# Patient Record
Sex: Male | Born: 1950 | Race: White | Hispanic: No | Marital: Married | Smoking: Former smoker
Health system: Southern US, Community
[De-identification: ages and names within clinical notes are randomized; demographics above are authoritative.]

## PROBLEM LIST (undated history)

## (undated) DIAGNOSIS — R351 Nocturia: Secondary | ICD-10-CM

## (undated) DIAGNOSIS — Z87442 Personal history of urinary calculi: Secondary | ICD-10-CM

## (undated) DIAGNOSIS — F32A Depression, unspecified: Secondary | ICD-10-CM

## (undated) DIAGNOSIS — H9190 Unspecified hearing loss, unspecified ear: Secondary | ICD-10-CM

## (undated) DIAGNOSIS — Z974 Presence of external hearing-aid: Secondary | ICD-10-CM

## (undated) DIAGNOSIS — E785 Hyperlipidemia, unspecified: Secondary | ICD-10-CM

## (undated) DIAGNOSIS — I10 Essential (primary) hypertension: Secondary | ICD-10-CM

## (undated) DIAGNOSIS — I2699 Other pulmonary embolism without acute cor pulmonale: Secondary | ICD-10-CM

## (undated) DIAGNOSIS — Z972 Presence of dental prosthetic device (complete) (partial): Secondary | ICD-10-CM

## (undated) DIAGNOSIS — Z7901 Long term (current) use of anticoagulants: Secondary | ICD-10-CM

## (undated) DIAGNOSIS — C61 Malignant neoplasm of prostate: Secondary | ICD-10-CM

## (undated) DIAGNOSIS — Z973 Presence of spectacles and contact lenses: Secondary | ICD-10-CM

## (undated) DIAGNOSIS — R413 Other amnesia: Secondary | ICD-10-CM

## (undated) DIAGNOSIS — Z87820 Personal history of traumatic brain injury: Secondary | ICD-10-CM

## (undated) DIAGNOSIS — F329 Major depressive disorder, single episode, unspecified: Secondary | ICD-10-CM

## (undated) DIAGNOSIS — R269 Unspecified abnormalities of gait and mobility: Secondary | ICD-10-CM

## (undated) HISTORY — PX: PROSTATE SURGERY: SHX751

## (undated) HISTORY — DX: Unspecified abnormalities of gait and mobility: R26.9

## (undated) HISTORY — PX: IMPLANTATION BONE ANCHORED HEARING AID: SUR691

## (undated) HISTORY — PX: COLONOSCOPY W/ POLYPECTOMY: SHX1380

## (undated) HISTORY — DX: Hyperlipidemia, unspecified: E78.5

## (undated) HISTORY — PX: FINGER SURGERY: SHX640

## (undated) HISTORY — PX: ANTERIOR CERVICAL DECOMP/DISCECTOMY FUSION: SHX1161

## (undated) HISTORY — DX: Essential (primary) hypertension: I10

---

## 2003-12-12 ENCOUNTER — Ambulatory Visit (HOSPITAL_COMMUNITY): Admission: RE | Admit: 2003-12-12 | Discharge: 2003-12-12 | Payer: Self-pay | Admitting: Gastroenterology

## 2008-10-17 DIAGNOSIS — Z87820 Personal history of traumatic brain injury: Secondary | ICD-10-CM

## 2008-10-17 HISTORY — DX: Personal history of traumatic brain injury: Z87.820

## 2008-10-19 ENCOUNTER — Inpatient Hospital Stay (HOSPITAL_COMMUNITY): Admission: EM | Admit: 2008-10-19 | Discharge: 2008-10-20 | Payer: Self-pay | Admitting: Emergency Medicine

## 2008-11-23 ENCOUNTER — Ambulatory Visit (HOSPITAL_COMMUNITY): Admission: RE | Admit: 2008-11-23 | Discharge: 2008-11-23 | Payer: Self-pay | Admitting: Plastic Surgery

## 2009-01-10 ENCOUNTER — Encounter
Admission: RE | Admit: 2009-01-10 | Discharge: 2009-04-10 | Payer: Self-pay | Admitting: Physical Medicine & Rehabilitation

## 2009-01-12 ENCOUNTER — Ambulatory Visit (HOSPITAL_COMMUNITY)
Admission: RE | Admit: 2009-01-12 | Discharge: 2009-01-12 | Payer: Self-pay | Admitting: Physical Medicine & Rehabilitation

## 2009-01-12 ENCOUNTER — Ambulatory Visit: Payer: Self-pay | Admitting: Physical Medicine & Rehabilitation

## 2009-02-08 ENCOUNTER — Ambulatory Visit: Payer: Self-pay | Admitting: Physical Medicine & Rehabilitation

## 2009-04-05 ENCOUNTER — Ambulatory Visit: Payer: Self-pay | Admitting: Physical Medicine & Rehabilitation

## 2009-07-04 ENCOUNTER — Encounter
Admission: RE | Admit: 2009-07-04 | Discharge: 2009-10-02 | Payer: Self-pay | Admitting: Physical Medicine & Rehabilitation

## 2009-07-06 ENCOUNTER — Ambulatory Visit: Payer: Self-pay | Admitting: Physical Medicine & Rehabilitation

## 2009-07-30 ENCOUNTER — Ambulatory Visit: Payer: Self-pay | Admitting: Physical Medicine & Rehabilitation

## 2009-09-03 ENCOUNTER — Ambulatory Visit: Payer: Self-pay | Admitting: Physical Medicine & Rehabilitation

## 2009-10-08 ENCOUNTER — Encounter
Admission: RE | Admit: 2009-10-08 | Discharge: 2009-11-12 | Payer: Self-pay | Admitting: Physical Medicine & Rehabilitation

## 2009-11-12 ENCOUNTER — Ambulatory Visit: Payer: Self-pay | Admitting: Physical Medicine & Rehabilitation

## 2010-01-16 ENCOUNTER — Encounter
Admission: RE | Admit: 2010-01-16 | Discharge: 2010-04-16 | Payer: Self-pay | Admitting: Physical Medicine & Rehabilitation

## 2010-01-22 ENCOUNTER — Ambulatory Visit: Payer: Self-pay | Admitting: Physical Medicine & Rehabilitation

## 2010-03-19 ENCOUNTER — Ambulatory Visit: Payer: Self-pay | Admitting: Physical Medicine & Rehabilitation

## 2010-06-21 ENCOUNTER — Ambulatory Visit: Payer: BC Managed Care – PPO | Admitting: Physical Medicine & Rehabilitation

## 2010-06-21 ENCOUNTER — Ambulatory Visit: Payer: Medicare Other | Attending: Physical Medicine & Rehabilitation

## 2010-06-21 ENCOUNTER — Ambulatory Visit: Admit: 2010-06-21 | Payer: Self-pay | Admitting: Physical Medicine & Rehabilitation

## 2010-06-21 DIAGNOSIS — Z79899 Other long term (current) drug therapy: Secondary | ICD-10-CM | POA: Insufficient documentation

## 2010-06-21 DIAGNOSIS — M542 Cervicalgia: Secondary | ICD-10-CM

## 2010-06-21 DIAGNOSIS — Z87820 Personal history of traumatic brain injury: Secondary | ICD-10-CM | POA: Insufficient documentation

## 2010-06-21 DIAGNOSIS — M47812 Spondylosis without myelopathy or radiculopathy, cervical region: Secondary | ICD-10-CM

## 2010-06-21 DIAGNOSIS — F329 Major depressive disorder, single episode, unspecified: Secondary | ICD-10-CM | POA: Insufficient documentation

## 2010-06-21 DIAGNOSIS — F411 Generalized anxiety disorder: Secondary | ICD-10-CM | POA: Insufficient documentation

## 2010-06-21 DIAGNOSIS — M4802 Spinal stenosis, cervical region: Secondary | ICD-10-CM | POA: Insufficient documentation

## 2010-06-21 DIAGNOSIS — G8929 Other chronic pain: Secondary | ICD-10-CM | POA: Insufficient documentation

## 2010-06-21 DIAGNOSIS — F3289 Other specified depressive episodes: Secondary | ICD-10-CM | POA: Insufficient documentation

## 2010-06-21 DIAGNOSIS — M47817 Spondylosis without myelopathy or radiculopathy, lumbosacral region: Secondary | ICD-10-CM

## 2010-08-13 ENCOUNTER — Other Ambulatory Visit: Payer: Self-pay | Admitting: Family Medicine

## 2010-08-13 DIAGNOSIS — M542 Cervicalgia: Secondary | ICD-10-CM

## 2010-08-14 ENCOUNTER — Other Ambulatory Visit: Payer: BC Managed Care – PPO

## 2010-08-14 ENCOUNTER — Ambulatory Visit
Admission: RE | Admit: 2010-08-14 | Discharge: 2010-08-14 | Disposition: A | Discharge status: Home / Self Care | Payer: Medicare Other | Source: Ambulatory Visit | Attending: Family Medicine | Admitting: Family Medicine

## 2010-08-14 DIAGNOSIS — M542 Cervicalgia: Secondary | ICD-10-CM

## 2010-08-26 LAB — CBC
HCT: 39.5 % (ref 39.0–52.0)
HCT: 43.1 % (ref 39.0–52.0)
Hemoglobin: 14.5 g/dL (ref 13.0–17.0)
MCHC: 33.7 g/dL (ref 30.0–36.0)
MCV: 87.8 fL (ref 78.0–100.0)
MCV: 88 fL (ref 78.0–100.0)
Platelets: 160 10*3/uL (ref 150–400)
RDW: 13.6 % (ref 11.5–15.5)
WBC: 7.6 10*3/uL (ref 4.0–10.5)

## 2010-08-26 LAB — BASIC METABOLIC PANEL
BUN: 12 mg/dL (ref 6–23)
Chloride: 109 mEq/L (ref 96–112)
Glucose, Bld: 82 mg/dL (ref 70–99)
Potassium: 3.8 mEq/L (ref 3.5–5.1)

## 2010-08-26 LAB — APTT: aPTT: 28 seconds (ref 24–37)

## 2010-09-03 ENCOUNTER — Other Ambulatory Visit: Payer: Self-pay | Admitting: Neurological Surgery

## 2010-09-03 DIAGNOSIS — M542 Cervicalgia: Secondary | ICD-10-CM

## 2010-09-04 ENCOUNTER — Other Ambulatory Visit: Payer: Self-pay | Admitting: Neurological Surgery

## 2010-09-04 DIAGNOSIS — M542 Cervicalgia: Secondary | ICD-10-CM

## 2010-09-06 ENCOUNTER — Other Ambulatory Visit: Payer: Medicare Other

## 2010-09-06 ENCOUNTER — Ambulatory Visit
Admission: RE | Admit: 2010-09-06 | Discharge: 2010-09-06 | Disposition: A | Discharge status: Home / Self Care | Payer: Medicare Other | Source: Ambulatory Visit | Attending: Neurological Surgery | Admitting: Neurological Surgery

## 2010-09-06 DIAGNOSIS — M542 Cervicalgia: Secondary | ICD-10-CM

## 2010-09-13 ENCOUNTER — Encounter: Payer: Medicare Other | Attending: Physical Medicine & Rehabilitation

## 2010-09-13 ENCOUNTER — Ambulatory Visit: Payer: Medicare Other | Admitting: Physical Medicine & Rehabilitation

## 2010-09-13 DIAGNOSIS — M5020 Other cervical disc displacement, unspecified cervical region: Secondary | ICD-10-CM | POA: Insufficient documentation

## 2010-09-13 DIAGNOSIS — M5412 Radiculopathy, cervical region: Secondary | ICD-10-CM

## 2010-09-13 DIAGNOSIS — M79609 Pain in unspecified limb: Secondary | ICD-10-CM | POA: Insufficient documentation

## 2010-09-13 DIAGNOSIS — Z79899 Other long term (current) drug therapy: Secondary | ICD-10-CM | POA: Insufficient documentation

## 2010-09-13 DIAGNOSIS — M4802 Spinal stenosis, cervical region: Secondary | ICD-10-CM

## 2010-09-13 DIAGNOSIS — Z87820 Personal history of traumatic brain injury: Secondary | ICD-10-CM | POA: Insufficient documentation

## 2010-09-13 DIAGNOSIS — M542 Cervicalgia: Secondary | ICD-10-CM | POA: Insufficient documentation

## 2010-09-13 NOTE — Assessment & Plan Note (Signed)
Fred [name] returns today, last saw me on June 21, 2010, in the interval time period, developed some pain running down his arms as well as increased neck pain.  He called his primary care physician and was referred to Neurosurgery.  He initially had a CT of his cervical spine on August 14, 2010, demonstrating a central right-sided protrusion flattening of the cord at C3-4, at C4-5, large central protrusion causing some cord compression, at C4-5 central disk causing mild stenosis and left greater than right foraminal stenosis C5-6, and at C6- 7 central right-sided disk protrusion.  MRI of the cervical spine was obtained on September 07, 2010.  I looked at these films, large extrusion C4- 5, caudal migration, epidural hemorrhage contributing spondylosis C5-6, mild central stenosis without cord deformity, severe right and moderate left foraminal stenosis C6-7.  The patient has had some grip strength weakness on the right side.  He has Neurosurgery followup in 3 days to look at type of surgery as well as timing.  He has had no gait disturbance.  No bowel or bladder dysfunction.  He has numbness and tingling in the hands which does hurt him.  His allergies include NEURONTIN, unfortunately.  His current meds include hydrocodone 10/325 t.i.d., he takes Tylenol with this 1 tablet.  REVIEW OF SYSTEMS:  Numbness, tingling, dizziness, confusion, depression, anxiety.  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  He also has a history of back pain, mild disk space narrowing L5- S1.  He does have a full-sized S1-S2 disk space.  PHYSICAL EXAMINATION:  VITAL SIGNS:  His blood pressure is elevated today 170/110.  I will ask nursing to recheck, but still elevated, he will need to contact Dr. Nicholos [name].  The patient is aware of this.  Pulse 65, respirations 18, O2 sat 98% on room air. MUSCULOSKELETAL:  His neck has limited range of motion, 50% range forward flexion,  extension, lateral rotation, and bending.  Negative Spurling's.  His most painful is with extension.  His upper extremity strength is 4/5 in the right deltoid, biceps, triceps grip, 5- in the left upper extremity.  Bilateral lower extremities are 5/5 strength. Deep tendon reflexes and mild hyperreflexia at bilateral knees, 2+ bilateral ankles, 2+ bilateral upper extremities.  Negative Hoffman's.  IMPRESSION:  Cervical stenosis with progression, likely couple of areas including C4-5 as well as C6-7, right paracentral.  PLAN:  Neurosurgical reevaluation Monday.  In the interval time, we will add some cyclobenzaprine 5 mg t.i.d.  We will increase his hydrocodone to q.i.d., he just got these filled last week, so he should have some left.  We will also add some nortriptyline 10 mg bedtime.  Consider steroid burst and taper if no better.     Fred [name], M.D. Electronically Signed    AEK/MedQ D:  09/13/2010 10:16:04  T:  09/13/2010 21:42:28  Job #:  [id number]  cc:   Fred [name], M.D. Fax: [phone]  Fred Alert, MD Fax: [phone]

## 2010-09-18 ENCOUNTER — Encounter (HOSPITAL_COMMUNITY)
Admission: RE | Admit: 2010-09-18 | Discharge: 2010-09-18 | Disposition: A | Discharge status: Home / Self Care | Payer: Medicare Other | Source: Ambulatory Visit | Attending: Neurological Surgery | Admitting: Neurological Surgery

## 2010-09-18 ENCOUNTER — Ambulatory Visit (HOSPITAL_COMMUNITY)
Admission: RE | Admit: 2010-09-18 | Discharge: 2010-09-18 | Disposition: A | Discharge status: Home / Self Care | Payer: Medicare Other | Source: Ambulatory Visit | Attending: Neurological Surgery | Admitting: Neurological Surgery

## 2010-09-18 ENCOUNTER — Other Ambulatory Visit (HOSPITAL_COMMUNITY): Payer: Self-pay | Admitting: Neurological Surgery

## 2010-09-18 DIAGNOSIS — M4802 Spinal stenosis, cervical region: Secondary | ICD-10-CM

## 2010-09-18 LAB — BASIC METABOLIC PANEL
BUN: 21 mg/dL (ref 6–23)
CO2: 28 mEq/L (ref 19–32)
Chloride: 102 mEq/L (ref 96–112)
Creatinine, Ser: 1.37 mg/dL (ref 0.4–1.5)
Glucose, Bld: 99 mg/dL (ref 70–99)

## 2010-09-18 LAB — CBC
MCH: 28.5 pg (ref 26.0–34.0)
MCHC: 33.8 g/dL (ref 30.0–36.0)
MCV: 84.2 fL (ref 78.0–100.0)
Platelets: 154 10*3/uL (ref 150–400)
RDW: 13.6 % (ref 11.5–15.5)
WBC: 6.3 10*3/uL (ref 4.0–10.5)

## 2010-09-18 LAB — APTT: aPTT: 26 seconds (ref 24–37)

## 2010-09-18 LAB — DIFFERENTIAL
Eosinophils Absolute: 0.2 10*3/uL (ref 0.0–0.7)
Eosinophils Relative: 3 % (ref 0–5)
Lymphs Abs: 2.2 10*3/uL (ref 0.7–4.0)

## 2010-09-19 ENCOUNTER — Inpatient Hospital Stay (HOSPITAL_COMMUNITY): Payer: Medicare Other

## 2010-09-19 ENCOUNTER — Ambulatory Visit (HOSPITAL_COMMUNITY)
Admission: RE | Admit: 2010-09-19 | Discharge: 2010-09-20 | Disposition: A | Discharge status: Home / Self Care | Payer: Medicare Other | Source: Ambulatory Visit | Attending: Neurological Surgery | Admitting: Neurological Surgery

## 2010-09-19 DIAGNOSIS — Z01812 Encounter for preprocedural laboratory examination: Secondary | ICD-10-CM | POA: Insufficient documentation

## 2010-09-19 DIAGNOSIS — M4712 Other spondylosis with myelopathy, cervical region: Secondary | ICD-10-CM | POA: Insufficient documentation

## 2010-09-19 DIAGNOSIS — Z79899 Other long term (current) drug therapy: Secondary | ICD-10-CM | POA: Insufficient documentation

## 2010-09-19 DIAGNOSIS — I10 Essential (primary) hypertension: Secondary | ICD-10-CM | POA: Insufficient documentation

## 2010-09-19 DIAGNOSIS — M5000 Cervical disc disorder with myelopathy, unspecified cervical region: Secondary | ICD-10-CM | POA: Insufficient documentation

## 2010-09-19 DIAGNOSIS — M488X2 Other specified spondylopathies, cervical region: Secondary | ICD-10-CM | POA: Insufficient documentation

## 2010-09-19 DIAGNOSIS — Z01818 Encounter for other preprocedural examination: Secondary | ICD-10-CM | POA: Insufficient documentation

## 2010-09-23 NOTE — Op Note (Signed)
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  PROCEDURES: 1. Decompressive anterior cervical diskectomy at C4-5, C5-6, and C6-7     for central canal and nerve root decompression 2. Anterior cervical arthrodesis at C4-5, C5-6, and C6-7 utilizing 9-     mm PEEK interbody cages, packed with local autograft and Actifuse     putty. 3. Anterior cervical plating at C4-C7 utilizing the NuVasive helix     translational plate.  SURGEON:  [name], MD  ASSISTANT:  Donalee Citrin, MD  ANESTHESIA:  General endotracheal.  COMPLICATIONS:  None apparent.  INDICATIONS FOR PROCEDURE:  Mr. Hightower is a 60 year old gentleman who presented with a several-month history of progressive numbness and weakness of his hands with difficulty with gait.  He was found to be myelopathic on exam.  He had an MRI which showed a very large disk herniation at C4-5 with severe cord compression and extension of the disk down behind the vertebral body of C5.  He also had spondylosis with some stenosis at C5-6 and C6-7, bilateral foraminal stenosis, and had a long history of chronic neck pain with right arm pain prior to his development of myelopathy.  He had a CT scan which showed some mild of ossification of the posterior  longitudinal ligament.  I recommended a 3- level ACDF with plating at C4-5, C5-6, and C6-7 in hopes of improving his syndrome.  We considered a corpectomy of C5, but felt with wide diskectomies of C4-5 and C5-6 we will be able to get the disk from out the behind the body of C5 without doing a full corpectomy.  He understood the risks, benefits, and expected outcomes of this procedure and wished to proceed.  DESCRIPTION OF PROCEDURE:  The patient was taken to the operating room. After induction of adequate generalized endotracheal anesthesia, he was placed in supine position on the operating room table.  His right anterior cervical region was prepped with DuraPrep and draped in the usual sterile fashion.  5 mL of local anesthesia was injected and a transverse incision was made to the right of midline and carried down to the platysma which was elevated, opened, and undermined with Metzenbaum scissors.  I then dissected a plane medial to the sternocleidomastoid muscle and internal carotid artery and lateral to the trachea and esophagus to expose C4-5, C5-6, and C6-7.  Intraoperative fluoroscopy confirmed my level and then the longus colli muscles were taken down and the shadow line retractors  were placed.  The anterior osteophytes were removed from C4-5, C5-6, and C6-7 to flatten the anterior cervical spine.  The annulus was incised and the initial diskectomy was done with pituitary rongeurs and curved curettes.  I then used the high-speed drill at each level to drill the endplates.  I widened the disk spaces of C4-5 and C5-6 to a height of 9 mm.  I removed a significant portion of C5 vertebral body from below and from above the C4-5 also knowing that I had to get the behind the body of C5.  I drilled down to the level of the posterior longitudinal ligament at each level.  The drill shavings were saved in the mucous trap for later arthrodesis.  I started at C4-5.  There was a large central  disk herniation with a large hole in the posterior longitudinal ligament.  I was able to remove this with a nerve hook and a pituitary rongeur.  This quickly relaxed the dura.  I was then able to open the posterior longitudinal ligament further and removed it while undercutting the bodies of C4 and C5.  He did have a partial ossification of the posterior longitudinal ligament and we could remove all of this without risk of CSF leak, but when we were done we had released it and the dura was full and capacious all the way across and we could see the cord pulsatile through the dura.  We felt like we had an adequate decompression of the central canal because of this and we could palpate with a nerve hook circumferentially.  I did remove several large fragments from underneath the body of C5 and the dura relaxed up against the posterior margin of C5.  Therefore, I felt like I had a good decompression there.  I then went to C5-6.  We found the same thing.  We found partial ossification, but we were able to remove most of this and undercut the bodies of C5 and C6 until the dura was once again relaxed and we could see the cord pulsatile through the dura.  We did bilateral foraminotomies to decompress the C6 nerve roots and again we decompressed at this level to a height of 9 mm.  At C6-7, we did the exact same thing.  We opened the posterior longitudinal ligament.  We removed it undercutting bodies of C6 and C7 and performed bilateral foraminotomies to decompress the C7 nerve roots.  I then palpated it with a nerve hook to ensure adequate decompression.  At this level, I decompressed a height of 8 mm.  Corresponding PEEK interbody cages were packed with local autograft and Actifuse putty and placed into the disk spaces at C4-5, C5-6, and C6-7.  We then used a NuVasive helix translational plate and placed two 13-mm fixed angle screws in the bodies of C4, C5, C6, and C7 and these locked into plate  by locking ring within the plate.  We then removed the stage from the translational part of the plate.  We then irrigated it with saline solution containing bacitracin and dried all bleeding points with bipolar cautery and with Surgifoam.  I placed a #7 flat JP drain and once meticulous hemostasis was achieved closed the platysma with 3-0 Vicryl, closed the subcuticular tissue with 3-0 Vicryl, and closed the skin with benzoin and Steri-Strips.  The drapes were removed.  Sterile dressing was applied.  The patient awakened from general anesthesia and transferred to the recovery room in stable condition.  At the end  of the procedure, all sponge, needle, and instrument counts were correct.     [name], MD     DSJ/MEDQ  D:  09/19/2010  T:  09/20/2010  Job:  [id number]  Electronically Signed by Marikay Alar MD on 09/23/2010 08:33:50 AM

## 2010-10-01 NOTE — Consult Note (Signed)
[name], Fred [name] NO.:  [id number]   MEDICAL RECORD NO.:  [id number]          PATIENT TYPE:  INP   LOCATION:  1847                         FACILITY:  [facility]   PHYSICIAN:  Loreta Ave, MD DATE OF BIRTH:  1950/10/24   DATE OF CONSULTATION:  10/19/2008  DATE OF DISCHARGE:                                 CONSULTATION   REFERRING PHYSICIAN:  Marta Lamas. Lindie Spruce, MD   CHIEF COMPLAINT:  Left wrist pain.   HISTORY OF PRESENT ILLNESS:  Fred [name] is a 60 year old right-hand-  dominant male who sustained a fall from approximately 6 feet yesterday  from a ladder and had fracture.  On his way down, he put out his left  wrist and struck the volar surface from the ground.  He complained of  left wrist pain but did not notice deformity.  He did note bruising  about the volar surface of his left wrist.  He has limited his hand use  to some extent.   Of note, he has also developed nausea and headache over the last day.  He was seen at [facility] on October 19, 2008 for evaluation and was  determined to have hemorrhagic contusions of the frontal lobes.  He will  be monitored by the Trauma Team for this.  I was consulted for his left  wrist.   ALLERGIES:  He has no known drug allergies.   Medications:    Wellbutrin 300 mg po qd    Benicar 40 mg po qd    Zocor 80 mg po qd    Effexor 40 mg po qd   PAST MEDICAL HISTORY:  Significant for history of rectal bleeding,  history of right hand trauma with right long finger amputation and right  index finger fracture, hypercholesterolemia.   PSH:  Right long finger amputation, right index finger fracture  fixation.   ROS: 12 system review negative except per HPI.   X-ray examination of the left wrist reveals a cortical defect on the  volar distal radius.  The fracture appears to only involve the volar  cortex with the metaphyseal flare.  It is not intra-articular.   PHYSICAL EXAMINATION:  Examination of the left upper  extremity reveals  normal active and passive range of motion of the shoulder, elbow, wrist,  and fingers.  Wrist motion is somewhat limited secondary to pain.  Sensation is intact in the radial, ulnar, and median nerve distributions  to light touch.  He has 2+ radial pulse and brisk capillary refill.  Flexion and extension is intact in all fingers.  He is focally tender  over the radial and volar aspect of the distal radius.   ASSESSMENT AND PLAN:  Fred [name] is a 60 year old right-hand  dominant male with a left distal radius fracture.  After discussion of  his fracture with the patient, I recommended splint immobilization.  The  patient was placed in a volar forearm splint.  He tolerated this well.  No manipulation was performed.  He is to followup in 2 weeks at [phone]  with Dr. Noelle Penner.      Loreta Ave,  MD  Electronically Signed     CF/MEDQ  D:  10/19/2008  T:  10/20/2008  Job:  [id number]

## 2010-10-01 NOTE — Group Therapy Note (Signed)
REASON FOR REFERRAL:  The patient referred for the evaluation of neck  pain and back pain.   HISTORY OF PRESENT ILLNESS:  A 60 year old male who has a long history  of neck and back pain.  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  He has had no neck or back surgery.  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  More recently, he had a  overnight admission for a mild traumatic brain injury, he fell off a 6  feet ladder and had a occipital skull fracture.  He has some  exacerbation of his pain related to this.  He has had a major complaint  of tinnitus in the right ear and loss of hearing since that fall.  He is  followed up with Dr. 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  He has had some radiating pain to his upper extremity,  numbness in the hands and arms, but this has been ongoing and preceded  his fall.  He initially had have post-traumatic headaches, but these  have improved over the last month or so.   He could walk 15 minutes at a time.  He climbs steps.  He drives.  He is  independent with all self-care and mobility.  His average pain is in the  6-7/10 range, improve with rest, pacing activities, medications,  worsened with standing and bending and sitting.   REVIEW OF SYSTEMS:  Positive for numbness, tingling, dizziness,  confusion, depression, anxiety.   PAST MEDICAL HISTORY:  Significant for hypertension, follows with Dr.  Nicholos [name] in [place].   SOCIAL HISTORY:  He is married.  He was relocated to [facility] to be  close to grandchildren.  All of his 3 children live in this area.   His other treatments have included PT while hospitalized and was in a  hospital [place], a trial of TENS  unit.  He does have a cervical  traction unit which does afford him some relief of his neck pain.   PHYSICAL EXAMINATION:  His blood pressure is 173/109, pulse 67,  respirations 18, O2 sat 96% room air.  His mood and affect are  appropriate.  His Oswestry score is 50%.   His neck has no tenderness to palpation.  He has good range of motion in  his neck, however, he has pain when he looks upward.  He does have an  obvious hearing loss, keeps his left ear toward the speaker.   Motor strength in the upper extremities and lower extremities is normal.  Range of motion in the upper and lower extremities is normal.  Sensation  in the upper and lower extremities is normal.   Deep tendon reflexes in the upper and lower extremities are normal.   Coordination in the upper and lower extremities is normal.  Extraocular  movements are normal.  Extremities are without edema.  Back has reduced  lumbar extension to about 25% flexion to 50% accompanied by enraged  pain.   IMPRESSION:  1. Chronic neck pain.  I suspect that is a combination of cervical      degenerative disk and spondylosis and may also have some foraminal  stenosis causing some intermittent radicular symptoms with upward      gaze.  We will check neck x-rays as well as flexion/extension films      to make sure there is no instability.  2. Lumbar pain likely multifactorial disk and facet.  We will check      lumbar films given the increased pain after fall, questionably      whether he may have sustained a compression fracture.   I will see him back in 1 month to review.  We will trial Voltaren gel,  urine drug screen.  He has been on chronic narcotic analgesics, but at a  fairly stable dose of 10 mg t.i.d.  Should his urine drug screen check  out, maybe able to  take over this prescription.  He also may benefit from cervical and  lumbar facet injections and radiofrequency neurotomy should he only gets  short-term  relief.      Fred [name], M.D.  Electronically Signed     AEK/MedQ  D:  01/12/2009 17:09:30  T:  01/13/2009 06:33:04  Job #:  [id number]   cc:   Fred [name], M.D.  Fax: [phone]

## 2010-10-01 NOTE — Discharge Summary (Signed)
Fred [name], Fred [name]              ACCOUNT NO.:  [id number]   MEDICAL RECORD NO.:  [id number]          PATIENT TYPE:  INP   LOCATION:  3101                         FACILITY:  [facility]   PHYSICIAN:  Cherylynn Ridges, M.D.    DATE OF BIRTH:  12-15-1950   DATE OF ADMISSION:  10/19/2008  DATE OF DISCHARGE:  10/20/2008                               DISCHARGE SUMMARY   DISCHARGE DIAGNOSES:  1. Fall.  2. 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  3. Left wrist fracture.  4. Scalp abrasion.  5. Dyslipidemia.  6. Hypertension.  7. Chronic low back pain and neck pain.   CONSULTANTS:  Dr. Channing Mutters, MD, for Neurosurgery and Dr. Noelle Penner for Hand  Surgery.   PROCEDURE:  None.   HISTORY OF PRESENT ILLNESS:  This is a 60 year old white male who fell  approximately 6 feet off a ladder onto his head 1 day prior to  admission.  There was unknown loss of consciousness.  The patient had  nausea and vomiting and came in because of continued nausea and  vomiting.  Workup demonstrated the wrist fracture and the head injury,  and the Neurosurgery and Orthopedic Surgery were consulted and the  patient was admitted to the Trauma Service.   HOSPITAL COURSE:  The patient did well in the hospital overnight.  He  had a repeat head CT which showed no worsening in his head injury.  Dr.  Noelle Penner decided on nonoperative management of his wrist fracture, and his  nausea and vomiting had resolved, so he was felt to be okay for  discharge home with his wife in good condition.   DISCHARGE MEDICATIONS:  Percocet 10/325 take 1-2 p.o. q.4 h. p.r.n.  pain, #60 with no refill.  In addition, he is to resume his home  medications which include:  1. Zocor 80 mg daily.  2. Bupropion XL 300 mg daily.  3. Venlafaxine XR 75 mg daily.  4. Diovan 320 mg daily.   FOLLOWUP:  The patient will need to follow up with Dr. Channing Mutters and Dr.  Noelle Penner in their offices.  If he has questions or concerns, he may call  the Trauma  Service.  Followup with [place] is going to be on an as-needed  basis.      Earney Hamburg, P.A.      Cherylynn Ridges, M.D.  Electronically Signed    MJ/MEDQ  D:  10/20/2008  T:  10/20/2008  Job:  [id number]   cc:   Loreta Ave, MD  Payton Doughty, M.D.

## 2010-10-01 NOTE — Consult Note (Signed)
NAMEROYE, GUSTAFSON NO.:  [id number]   MEDICAL RECORD NO.:  [id number]          PATIENT TYPE:  INP   LOCATION:  3101                         FACILITY:  [facility]   PHYSICIAN:  Payton Doughty, M.D.      DATE OF BIRTH:  1951/05/15   DATE OF CONSULTATION:  10/19/2008  DATE OF DISCHARGE:                                 CONSULTATION   __________  Marta Lamas. Lindie Spruce, M.D.   BODY OF TEXT:  I was called to see this 60 year old right-handed white  gentleman who fell off about 10 feet of the ladder yesterday with  questionable  loss of consciousness.  He had nausea and vomiting through  the night and complained of a headache, came to [facility].  CT showed some  frontal contusions and right occipital skull fracture, which is  nondisplaced.  He also has a fracture in his left wrist.   MEDICAL HISTORY:  Fairly benign and left to the Trauma Service.   MEDICATIONS:  He is not any anticoagulants.   His general exam appears to be intact.  Neurologically he is awake,  alert, and oriented x3.  His pupils are equal, round, and reactive to  light.  Extraocular movements are intact.  Facial movement and sensation  intact.  Tongue protrudes in the midline.  Shoulder shrug is normal.  Describes his following difficulties.  Motor exam shows 5/5 strength  throughout the upper and lower extremities.  No pronator drift.  Sensation is intact.  Reflexes are nonpathologic.   CT demonstrates a frontal contusions, which are small without mass  effect and then the right occipital skull fracture, which is also  nondisplaced.   CLINICAL IMPRESSION:  Closed head injury with concussion.  He can be  observed today, rescan in the morning.  I will check his scan tomorrow  morning.           ______________________________  Payton Doughty, M.D.     MWR/MEDQ  D:  10/19/2008  T:  10/20/2008  Job:  [id number]

## 2010-10-04 NOTE — Op Note (Signed)
Fred [name], Fred [name]                        ACCOUNT NO.:  [id number]   MEDICAL RECORD NO.:  [id number]                   PATIENT TYPE:  AMB   LOCATION:  ENDO                                 FACILITY:  [facility]   PHYSICIAN:  James L. Malon Kindle., M.D.          DATE OF BIRTH:  01/07/1951   DATE OF PROCEDURE:  12/12/2003  DATE OF DISCHARGE:                                 OPERATIVE REPORT   PROCEDURE:  Colonoscopy.   MEDICATIONS:  Fentanyl 70 mcg, Versed 6 mg IV.   SCOPE:  Olympus pediatric adjustable colonoscope.   INDICATION:  Rectal bleeding in a gentleman with a previous history of colon  polyps and a family history of colon cancer.   DESCRIPTION OF PROCEDURE:  The patient had been explained to the patient and  consent obtained.  With the patient in the left lateral decubitus position,  the Olympus pediatric adjustable scope was inserted and advanced.  The prep  was excellent.  We were able to reach the cecum without difficulty.  The  ileocecal valve and appendiceal orifice were seen.  The scope was withdrawn.  The cecum, ascending colon, transverse colon, splenic flexure, and  descending sigmoid colon were seen well.  No polyps or other lesions were  seen.  The scope was withdrawn down to the rectum and the rectum was free of  polyps.  There were scattered diverticula in the sigmoid colon.  Internal  hemorrhoids were seen in the rectum upon removal of the scope.  The scope  was withdrawn and the patient tolerated the procedure well.   ASSESSMENT:  1. Rectal bleeding, 578.1, probably due to internal hemorrhoids.  2. History of colon polyps, negative colonoscopy, V12.72.  3. Family history of colon cancer, V16.0.   PLAN:  I would recommend yearly hemoccults, hemorrhoid instruction sheet,  and repeat procedure in five years.                                               James L. Malon Kindle., M.D.    Waldron Session  D:  12/12/2003  T:  12/12/2003  Job:  [id number]   cc:   Molly Maduro A.  Nicholos Johns, M.D.  [address]  [place]  [place] [address]  Fax: [phone]

## 2010-10-15 ENCOUNTER — Ambulatory Visit
Admission: RE | Admit: 2010-10-15 | Discharge: 2010-10-15 | Disposition: A | Discharge status: Home / Self Care | Payer: Medicare Other | Source: Ambulatory Visit | Attending: Neurological Surgery | Admitting: Neurological Surgery

## 2010-10-15 ENCOUNTER — Other Ambulatory Visit: Payer: Self-pay | Admitting: Neurological Surgery

## 2010-10-15 DIAGNOSIS — M4802 Spinal stenosis, cervical region: Secondary | ICD-10-CM

## 2010-10-15 DIAGNOSIS — M545 Low back pain: Secondary | ICD-10-CM

## 2010-12-10 ENCOUNTER — Encounter: Payer: Medicare Other | Attending: Neurosurgery | Admitting: Neurosurgery

## 2010-12-10 DIAGNOSIS — M542 Cervicalgia: Secondary | ICD-10-CM | POA: Insufficient documentation

## 2010-12-10 DIAGNOSIS — M4802 Spinal stenosis, cervical region: Secondary | ICD-10-CM

## 2010-12-10 DIAGNOSIS — Z981 Arthrodesis status: Secondary | ICD-10-CM | POA: Insufficient documentation

## 2010-12-10 NOTE — Assessment & Plan Note (Signed)
Fred [name] has been followed by Fred [name] for some cervical stenosis and pain for some period of time.  He tells me now just a few weeks status post a 2 or 3-level ACDF per Fred [name] at [facility].  He is well healed.  He appears to have fairly good range of motion.  He states he feels like he has lost some range of motion in his neck.  He follows up with Dr. Yetta [name] for radiographs next week.  He rates his pain as 6-7.  It is a sharp pain.  His activity level is 4-8. Pain is worse during the day.  Sleep patterns are poor.  Most of the time, bending, sitting, and standing are most aggravating factors.  Rest and medication tend to help.  Again, he is still immediate postop.  He does climb steps and drive.  He walks without assistance.  He is retired.  REVIEW OF SYSTEMS:  Notable for those difficulties described above as well as some depression, and anxiety.  No suicidal thoughts or aberrant behaviors.  PAST MEDICAL HISTORY:  Significant for the recent surgery with Dr. Yetta [name].  He stated it was a few weeks ago.  According to his flow sheet, it was in May.  SOCIAL HISTORY:  He is married.  He lives with his wife.  FAMILY HISTORY:  Unchanged.  PHYSICAL EXAMINATION:  Blood pressure 131/69, pulse 90, respiration 20, and O2 sats 97 on room air.  He has fairly good range of motion of his neck.  He can flex and extend and rotate from side to side without any difficulty.  His strength and sensation are good in his upper and lower extremities.  Constitution is within normal limits.  He is alert and oriented x3.  IMPRESSION:  Cervical stenosis, status post cervical fusion with Fred [name], still postoperative and doing well.  PLAN:  He will continue his current medication regimen.  He did receive a small amount of Vicodin from Dr. Yetta [name].  We are going to refill his hydrocodone today 10/325 one p.o. t.i.d., #90 with no refill.  He understands that the idea now that he  has had this effusion.  We will need to wean him down off this medicine over a period of time and he understands that.  He will return to the clinic in 3 months.  His refills between now and then will go to hydrocodone 5/325 and then hopefully off it after that.     Fred Milius L. Fred [name] Electronically Signed    RLW/MedQ D:  12/10/2010 09:20:01  T:  12/10/2010 10:55:16  Job #:  [id number]

## 2011-01-13 ENCOUNTER — Ambulatory Visit
Admission: RE | Admit: 2011-01-13 | Discharge: 2011-01-13 | Disposition: A | Discharge status: Home / Self Care | Payer: Medicare Other | Source: Ambulatory Visit | Attending: Neurological Surgery | Admitting: Neurological Surgery

## 2011-01-13 ENCOUNTER — Other Ambulatory Visit: Payer: Self-pay | Admitting: Neurological Surgery

## 2011-01-13 DIAGNOSIS — M4802 Spinal stenosis, cervical region: Secondary | ICD-10-CM

## 2011-01-13 DIAGNOSIS — M4712 Other spondylosis with myelopathy, cervical region: Secondary | ICD-10-CM

## 2011-01-13 DIAGNOSIS — M542 Cervicalgia: Secondary | ICD-10-CM

## 2011-03-04 ENCOUNTER — Ambulatory Visit: Payer: Medicare Other | Admitting: Physical Medicine & Rehabilitation

## 2011-03-10 ENCOUNTER — Encounter: Payer: Medicare Other | Attending: Neurosurgery | Admitting: Neurosurgery

## 2011-03-10 ENCOUNTER — Ambulatory Visit: Payer: Medicare Other | Admitting: Physical Medicine & Rehabilitation

## 2011-03-10 DIAGNOSIS — M961 Postlaminectomy syndrome, not elsewhere classified: Secondary | ICD-10-CM

## 2011-03-10 DIAGNOSIS — G894 Chronic pain syndrome: Secondary | ICD-10-CM

## 2011-03-10 DIAGNOSIS — M4802 Spinal stenosis, cervical region: Secondary | ICD-10-CM

## 2011-03-10 DIAGNOSIS — Z981 Arthrodesis status: Secondary | ICD-10-CM | POA: Insufficient documentation

## 2011-03-11 ENCOUNTER — Ambulatory Visit: Payer: Medicare Other | Admitting: Physical Medicine & Rehabilitation

## 2011-03-11 NOTE — Assessment & Plan Note (Signed)
This patient of Dr. Wynn Banker seen for cervical stenosis as well as back pain.  He is a status post 2 level ACDF with Dr. Yetta Barre.  He states his pain has been much better.  It is about a 4.  He is waiting to self down off his hydrocodone.  His activity level is 4-5.  Pain is worse in the evening.  Sleep patterns are fair.  Pain is worse with sitting. Medication helps.  He climb steps and drive.  He can walk about 10 minutes or more at a time.  He is on disability.  REVIEW OF SYSTEMS:  Notable for difficulties described above as well as some bladder control issues, numbness and depression, but diminished olfactory senses.  No suicidal thoughts or aberrant behaviors.  Pill counts are correct.  Last UDS was consistent.  PAST MEDICAL HISTORY, SOCIAL HISTORY AND FAMILY HISTORY:  Unchanged.  PHYSICAL EXAMINATION:  VITAL SIGNS:  His blood pressure is 149/93, pulse 78, respirations 16 and O2 sats 92 on room air.  NEUROLOGIC: Constitutionally, he is within normal limits.  He is alert and oriented x3.  He has normal gait.  His motor strength and sensation are intact in the upper and lower extremities.  IMPRESSION:  Cervical stenosis, status post cervical fusion.  PLAN:  Refilled hydrocodone dose 5/325 one half to one p.o. daily 45 with no refill.  He will try to wean completely off by his next appointment in 3 months.  His questions were encouraged and answered.     Deashia Soule L. Blima Dessert Electronically Signed    RLW/MedQ D:  03/10/2011 11:48:10  T:  03/11/2011 01:24:20  Job #:  [id number]

## 2011-06-10 ENCOUNTER — Ambulatory Visit: Payer: Medicare Other | Admitting: Neurosurgery

## 2011-06-26 ENCOUNTER — Encounter: Payer: Medicare Other | Attending: Physical Medicine & Rehabilitation

## 2011-06-26 ENCOUNTER — Ambulatory Visit: Payer: Medicare Other | Admitting: Physical Medicine & Rehabilitation

## 2011-06-26 DIAGNOSIS — M4712 Other spondylosis with myelopathy, cervical region: Secondary | ICD-10-CM | POA: Diagnosis not present

## 2011-06-26 DIAGNOSIS — R51 Headache: Secondary | ICD-10-CM | POA: Insufficient documentation

## 2011-06-26 DIAGNOSIS — M549 Dorsalgia, unspecified: Secondary | ICD-10-CM | POA: Insufficient documentation

## 2011-06-26 DIAGNOSIS — M542 Cervicalgia: Secondary | ICD-10-CM | POA: Insufficient documentation

## 2011-06-26 DIAGNOSIS — M47817 Spondylosis without myelopathy or radiculopathy, lumbosacral region: Secondary | ICD-10-CM

## 2011-06-26 DIAGNOSIS — Z981 Arthrodesis status: Secondary | ICD-10-CM | POA: Diagnosis not present

## 2011-06-26 DIAGNOSIS — M961 Postlaminectomy syndrome, not elsewhere classified: Secondary | ICD-10-CM | POA: Diagnosis not present

## 2011-06-26 NOTE — Assessment & Plan Note (Signed)
REASON FOR VISIT:  Neck pain, back pain.   -year-old male with history of cervical myelopathy and spondylosis underwent cervical laminectomy and fusion, ACDF by Dr. Yetta Barre since I last saw him in April of 2012.  His neck pain is doing much better. Headache  pain is doing much better.  His narcotic analgesic usage is better, taking only about a 1-1/2, 5 mg hydrocodone per day.  His back is bothering a bit since he has been sleeping at the hospital or spending a lot of time at the hospital.  His daughter had a cerebral aneurysm and stroke in January.  His pain is rated about 4/10.  REVIEW OF SYSTEMS:  Positive for depression, anxiety, weakness.  OBJECTIVE:  VITAL SIGNS:  Blood pressure 132/64, pulse 66, respirations 18, O2 sat 99% on room air, weight 180 pounds, height 5 feet 8 inches. MUSCULOSKELETAL:  His neck range of motion is 75% range forward flexion, extension, lateral rotation, and bending.  Upper extremity strength is normal.  His back has no tenderness to palpation.  Gait is mild gait imbalance.  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  PLAN:  Continue hydrocodone 1/2 to 1-1/2 tablets per day.  I will see him back in about 3 months.  He will consider repeat medial branch blocks versus RF.  Neck disability index is 68%.     Erick Colace, M.D. Electronically Signed    AEK/MedQ D:  06/26/2011 14:26:22  T:  06/26/2011 21:19:06  Job #:  [id number]

## 2011-08-21 DIAGNOSIS — I10 Essential (primary) hypertension: Secondary | ICD-10-CM | POA: Diagnosis not present

## 2011-08-21 DIAGNOSIS — F329 Major depressive disorder, single episode, unspecified: Secondary | ICD-10-CM | POA: Diagnosis not present

## 2011-08-21 DIAGNOSIS — E785 Hyperlipidemia, unspecified: Secondary | ICD-10-CM | POA: Diagnosis not present

## 2011-08-21 DIAGNOSIS — N183 Chronic kidney disease, stage 3 unspecified: Secondary | ICD-10-CM | POA: Diagnosis not present

## 2011-08-21 DIAGNOSIS — F3289 Other specified depressive episodes: Secondary | ICD-10-CM | POA: Diagnosis not present

## 2011-08-21 DIAGNOSIS — M549 Dorsalgia, unspecified: Secondary | ICD-10-CM | POA: Diagnosis not present

## 2011-09-02 ENCOUNTER — Telehealth: Payer: Self-pay | Admitting: Physical Medicine & Rehabilitation

## 2011-09-02 MED ORDER — HYDROCODONE-ACETAMINOPHEN 5-325 MG PO TABS: 0.5000 | ORAL_TABLET | Freq: Every day | ORAL | Status: DC

## 2011-09-02 NOTE — Telephone Encounter (Signed)
No message, medication refilled by Almira Coaster CMA

## 2011-09-02 NOTE — Telephone Encounter (Signed)
Rx called in for hydrocodone, pt aware.

## 2011-09-02 NOTE — Telephone Encounter (Signed)
Needs refill on pain medication.  (I was unable to reach pt to verify which pain medication/mjk).

## 2011-09-23 ENCOUNTER — Ambulatory Visit (HOSPITAL_BASED_OUTPATIENT_CLINIC_OR_DEPARTMENT_OTHER): Payer: Medicare Other | Admitting: Physical Medicine & Rehabilitation

## 2011-09-23 ENCOUNTER — Encounter: Payer: Medicare Other | Attending: Physical Medicine & Rehabilitation

## 2011-09-23 ENCOUNTER — Encounter: Payer: Self-pay | Admitting: Physical Medicine & Rehabilitation

## 2011-09-23 VITALS — BP 105/69 | HR 62 | Resp 16 | Ht 69.0 in | Wt 184.0 lb

## 2011-09-23 DIAGNOSIS — M4712 Other spondylosis with myelopathy, cervical region: Secondary | ICD-10-CM | POA: Insufficient documentation

## 2011-09-23 DIAGNOSIS — Z79899 Other long term (current) drug therapy: Secondary | ICD-10-CM | POA: Diagnosis not present

## 2011-09-23 DIAGNOSIS — M542 Cervicalgia: Secondary | ICD-10-CM | POA: Diagnosis not present

## 2011-09-23 DIAGNOSIS — Z981 Arthrodesis status: Secondary | ICD-10-CM | POA: Insufficient documentation

## 2011-09-23 DIAGNOSIS — Z5181 Encounter for therapeutic drug level monitoring: Secondary | ICD-10-CM | POA: Diagnosis not present

## 2011-09-23 DIAGNOSIS — M4802 Spinal stenosis, cervical region: Secondary | ICD-10-CM | POA: Diagnosis not present

## 2011-09-23 DIAGNOSIS — M961 Postlaminectomy syndrome, not elsewhere classified: Secondary | ICD-10-CM | POA: Diagnosis not present

## 2011-09-23 DIAGNOSIS — R51 Headache: Secondary | ICD-10-CM | POA: Diagnosis not present

## 2011-09-23 DIAGNOSIS — M549 Dorsalgia, unspecified: Secondary | ICD-10-CM | POA: Diagnosis not present

## 2011-09-23 DIAGNOSIS — R209 Unspecified disturbances of skin sensation: Secondary | ICD-10-CM | POA: Diagnosis not present

## 2011-09-23 DIAGNOSIS — R202 Paresthesia of skin: Secondary | ICD-10-CM

## 2011-09-23 NOTE — Progress Notes (Addendum)
  Subjective:    Patient ID: Fred [name], male    DOB: 1950/08/13, 61 y.o.   MRN: [id number]  HPI male with history of cervical myelopathy and spondylosis  underwent cervical laminectomy and fusion C4-7, ACDF by Dr. Yetta Barre in 2012. His neck pain is doing much better.  Headache pain is doing much better. His narcotic analgesic usage is  better, taking only about a 1-1/2, 5 mg hydrocodone per day. Numbness in R index finger Pain Inventory Average Pain 5 Pain Right Now 6 My pain is intermittent and aching  In the last 24 hours, has pain interfered with the following? General activity 7 Relation with others 7 Enjoyment of life 7 What TIME of day is your pain at its worst? daytime Sleep (in general) Fair  Pain is worse with: walking, inactivity and standing Pain improves with: rest and medication Relief from Meds: 7  Mobility walk without assistance how many minutes can you walk? 20 ability to climb steps?  yes do you drive?  yes  Function retired  Neuro/Psych numbness tingling depression  Prior Studies Any changes since last visit?  no  Physicians involved in your care Any changes since last visit?  no      Review of Systems  HENT: Positive for neck pain.   Musculoskeletal: Positive for back pain.  Neurological: Positive for numbness.  Psychiatric/Behavioral: Positive for dysphoric mood.  All other systems reviewed and are negative.       Objective:   Physical Exam  Constitutional: He is oriented to person, place, and time. He appears well-developed and well-nourished.  HENT:  Head: Normocephalic and atraumatic.  Eyes: Conjunctivae and EOM are normal. Pupils are equal, round, and reactive to light.  Musculoskeletal:       Cervical back: He exhibits decreased range of motion.       Lumbar back: He exhibits decreased range of motion.  Neurological: He is alert and oriented to person, place, and time. No cranial nerve deficit.  Psychiatric: He has a  normal mood and affect.   Sensation is reduced in the right index finger. Intact in the right little finger. Intact in the left index finger. Tinel sign is negative at the wrist.        Assessment & Plan:  1. Cervical postlaminectomy syndrome. He does have some chronic hand weakness which may be related to a history of myelopathy. In addition he has some new sensory changes in the right index finger which may be carpal tunnel syndrome rather than cervical radiculopathy. I've offered further investigation with EMG. he declines at the current time. Will revisit this issue in a few months. If this worsens he can call and we can get him in for study.  UDS negative for hydrocodone non narcotic tx only May use tramadol

## 2011-09-23 NOTE — Patient Instructions (Addendum)
Electromyography (EMG) Test This is a test in which very small electrodes are placed into your muscle tissue. It looks at the electrical impulses of your muscle tissue. This test is used to determine whether or not there are involuntary or spontaneous muscle movements. Involuntary or spontaneous means muscle movements that happen by themselves. This may indicate injury or disease of the nerves which supply that muscle. PREPARATION FOR TEST No preparation or fasting is necessary. Some stimulants such as caffeine and tobacco may need to be avoided for 2-3 hours before test or as instructed by your caregiver. NORMAL FINDINGS No evidence of neuromuscular abnormalities. Ranges for normal findings may vary among different laboratories and hospitals. You should always check with your doctor after having lab work or other tests done to discuss the meaning of your test results and whether your values are considered within normal limits. MEANING OF TEST  Your caregiver will go over the test results with you and discuss the importance and meaning of your results, as well as treatment options and the need for additional tests if necessary. OBTAINING THE TEST RESULTS It is your responsibility to obtain your test results. Ask the lab or department performing the test when and how you will get your results. Document Released: 09/05/2004 Document Revised: 04/24/2011 Document Reviewed: 04/14/2008 [employer] Patient Information 2012 [employer]. 

## 2011-10-20 ENCOUNTER — Other Ambulatory Visit: Payer: Self-pay | Admitting: Physical Medicine & Rehabilitation

## 2011-12-02 ENCOUNTER — Other Ambulatory Visit: Payer: Self-pay | Admitting: Physical Medicine & Rehabilitation

## 2011-12-23 DIAGNOSIS — H903 Sensorineural hearing loss, bilateral: Secondary | ICD-10-CM | POA: Diagnosis not present

## 2011-12-23 DIAGNOSIS — H9190 Unspecified hearing loss, unspecified ear: Secondary | ICD-10-CM | POA: Diagnosis not present

## 2011-12-24 ENCOUNTER — Encounter
Payer: Medicare Other | Attending: Physical Medicine and Rehabilitation | Admitting: Physical Medicine and Rehabilitation

## 2011-12-24 ENCOUNTER — Encounter: Payer: Self-pay | Admitting: Physical Medicine and Rehabilitation

## 2011-12-24 VITALS — BP 119/72 | HR 63 | Resp 14 | Ht 69.0 in | Wt 183.0 lb

## 2011-12-24 DIAGNOSIS — I10 Essential (primary) hypertension: Secondary | ICD-10-CM | POA: Insufficient documentation

## 2011-12-24 DIAGNOSIS — R51 Headache: Secondary | ICD-10-CM | POA: Diagnosis not present

## 2011-12-24 DIAGNOSIS — M961 Postlaminectomy syndrome, not elsewhere classified: Secondary | ICD-10-CM | POA: Diagnosis not present

## 2011-12-24 DIAGNOSIS — G8929 Other chronic pain: Secondary | ICD-10-CM | POA: Insufficient documentation

## 2011-12-24 DIAGNOSIS — R209 Unspecified disturbances of skin sensation: Secondary | ICD-10-CM | POA: Diagnosis not present

## 2011-12-24 DIAGNOSIS — R29898 Other symptoms and signs involving the musculoskeletal system: Secondary | ICD-10-CM | POA: Insufficient documentation

## 2011-12-24 DIAGNOSIS — M542 Cervicalgia: Secondary | ICD-10-CM

## 2011-12-24 DIAGNOSIS — E785 Hyperlipidemia, unspecified: Secondary | ICD-10-CM | POA: Insufficient documentation

## 2011-12-24 DIAGNOSIS — M545 Low back pain, unspecified: Secondary | ICD-10-CM | POA: Insufficient documentation

## 2011-12-24 DIAGNOSIS — Z981 Arthrodesis status: Secondary | ICD-10-CM | POA: Insufficient documentation

## 2011-12-24 MED ORDER — HYDROCODONE-ACETAMINOPHEN 5-325 MG PO TABS: 1.0000 | ORAL_TABLET | Freq: Once | ORAL | Status: DC

## 2011-12-24 NOTE — Progress Notes (Signed)
Subjective:    Patient ID: Fred [name], male    DOB: 06/23/1950, 61 y.o.   MRN: [id number]  HPI The patient complains about chronic low back pain. The patient denies any radiation.  The problem has been stable, with having good and bad days. male with history of cervical myelopathy and spondylosis  underwent cervical laminectomy and fusion C4-7, ACDF by Dr. Yetta Barre in 2012. His neck pain is doing much better.  Headache pain is doing much better. His narcotic analgesic usage is  better, taking only about a 1-1/2, 5 mg hydrocodone per day, prn pain, he has several days where he does not take any.  Numbness in R index finger  Pain Inventory Average Pain 4 Pain Right Now 4 My pain is intermittent, tingling and aching  In the last 24 hours, has pain interfered with the following? General activity 4 Relation with others 5 Enjoyment of life 5 What TIME of day is your pain at its worst? evening Sleep (in general) Poor  Pain is worse with: walking and bending Pain improves with: rest and medication Relief from Meds: 5  Mobility ability to climb steps?  yes do you drive?  yes  Function disabled: date disabled   Neuro/Psych numbness depression  Prior Studies Any changes since last visit?  no  Physicians involved in your care Any changes since last visit?  no   Family History  Problem Relation Age of Onset  . Stroke Mother   . Heart disease Father   . Stroke Father     heat stroke  . Kidney disease Brother    History   Social History  . Marital Status: Married    Spouse Name: N/A    Number of Children: N/A  . Years of Education: N/A   Social History Main Topics  . Smoking status: Former Smoker    Types: Cigarettes    Quit date: 05/26/2007  . Smokeless tobacco: Former Neurosurgeon    Quit date: 09/23/1991  . Alcohol Use: None  . Drug Use: None  . Sexually Active: None   Other Topics Concern  . None   Social History Narrative  . None   Past Surgical History   Procedure Date  . Spine surgery   . Brain surgery    Past Medical History  Diagnosis Date  . Neuromuscular disorder   . Hypertension   . Hyperlipidemia    BP 119/72  Pulse 63  Resp 14  Ht 5\' 9"  (1.753 m)  Wt 183 lb (83.008 kg)  BMI 27.02 kg/m2  SpO2 99%     Review of Systems  HENT: Positive for neck pain.   Musculoskeletal: Positive for myalgias, back pain and arthralgias.  Neurological: Positive for numbness.  Psychiatric/Behavioral: Positive for dysphoric mood.  All other systems reviewed and are negative.       Objective:   Physical Exam  Constitutional: He is oriented to person, place, and time. He appears well-developed and well-nourished.  HENT:  Head: Normocephalic.  Musculoskeletal: He exhibits tenderness.  Neurological: He is alert and oriented to person, place, and time.  Skin: Skin is warm and dry.  Psychiatric: He has a normal mood and affect.    Symmetric normal motor tone is noted throughout. Normal muscle bulk. Muscle testing reveals 5/5 muscle strength of the upper extremity, and 5/5 of the lower extremity. Full range of motion in upper and lower extremities. ROM of spine is restricted. DTR in the upper and lower extremity are present and symmetric  2+. No clonus is noted.  Patient arises from chair without difficulty. Narrow based gait with normal arm swing bilateral , able to walk on heels and toes . Tandem walk is stable. No pronator drift. Rhomberg negative.        Assessment & Plan:  1. Cervical postlaminectomy syndrome. He does have some chronic hand weakness which may be related to a history of myelopathy. In addition he has some new sensory changes in the right index finger, offered further investigation with EMG. he declines at the current time. His neck and UE are doing very well at this time.  UDS negative for hydrocodone, but patient is on a very low dose and took his last tablet six days before the UDS. Patient tries to take as little  of his Hydrocodone 5mg  as possible, refilled his Hydrocodone. Advised patient to start exercise program in the pool, this would be beneficial for his low back pain and his neck, also it would help him to lose some weight which is bothering the patient. I advised the patient to find out whether his insurance support the Silver sneakers program, to prevent high costs. Followup in 3 month.

## 2011-12-24 NOTE — Patient Instructions (Signed)
Look into exercising, walking or swimming in the pool.

## 2012-01-09 ENCOUNTER — Telehealth: Payer: Self-pay | Admitting: *Deleted

## 2012-01-09 ENCOUNTER — Other Ambulatory Visit: Payer: Self-pay | Admitting: Physical Medicine & Rehabilitation

## 2012-01-09 MED ORDER — HYDROCODONE-ACETAMINOPHEN 5-325 MG PO TABS: 1.0000 | ORAL_TABLET | Freq: Once | ORAL | Status: DC

## 2012-01-09 NOTE — Telephone Encounter (Signed)
Hydrocodone refill  By looking in the chart Clydie Braun gave him a refill on 12/24/11 with 2 additional refills. This was supposed to be called into his pharmacy on that day and never was. I have called in his rx to [facility] in [place]. His wife is aware of this information.

## 2012-03-11 DIAGNOSIS — F3289 Other specified depressive episodes: Secondary | ICD-10-CM | POA: Diagnosis not present

## 2012-03-11 DIAGNOSIS — F329 Major depressive disorder, single episode, unspecified: Secondary | ICD-10-CM | POA: Diagnosis not present

## 2012-03-11 DIAGNOSIS — I10 Essential (primary) hypertension: Secondary | ICD-10-CM | POA: Diagnosis not present

## 2012-03-11 DIAGNOSIS — E291 Testicular hypofunction: Secondary | ICD-10-CM | POA: Diagnosis not present

## 2012-03-11 DIAGNOSIS — N183 Chronic kidney disease, stage 3 unspecified: Secondary | ICD-10-CM | POA: Diagnosis not present

## 2012-03-11 DIAGNOSIS — Z23 Encounter for immunization: Secondary | ICD-10-CM | POA: Diagnosis not present

## 2012-03-11 DIAGNOSIS — M549 Dorsalgia, unspecified: Secondary | ICD-10-CM | POA: Diagnosis not present

## 2012-03-11 DIAGNOSIS — E785 Hyperlipidemia, unspecified: Secondary | ICD-10-CM | POA: Diagnosis not present

## 2012-03-24 ENCOUNTER — Encounter: Payer: Medicare Other | Admitting: Physical Medicine and Rehabilitation

## 2012-03-31 ENCOUNTER — Encounter: Payer: Self-pay | Admitting: Physical Medicine and Rehabilitation

## 2012-03-31 ENCOUNTER — Encounter
Payer: Medicare Other | Attending: Physical Medicine and Rehabilitation | Admitting: Physical Medicine and Rehabilitation

## 2012-03-31 VITALS — BP 127/72 | HR 66 | Resp 14 | Ht 69.0 in | Wt 181.0 lb

## 2012-03-31 DIAGNOSIS — G8929 Other chronic pain: Secondary | ICD-10-CM | POA: Diagnosis not present

## 2012-03-31 DIAGNOSIS — R51 Headache: Secondary | ICD-10-CM | POA: Insufficient documentation

## 2012-03-31 DIAGNOSIS — Z5181 Encounter for therapeutic drug level monitoring: Secondary | ICD-10-CM

## 2012-03-31 DIAGNOSIS — R209 Unspecified disturbances of skin sensation: Secondary | ICD-10-CM | POA: Insufficient documentation

## 2012-03-31 DIAGNOSIS — M4712 Other spondylosis with myelopathy, cervical region: Secondary | ICD-10-CM | POA: Insufficient documentation

## 2012-03-31 DIAGNOSIS — R29898 Other symptoms and signs involving the musculoskeletal system: Secondary | ICD-10-CM | POA: Diagnosis not present

## 2012-03-31 DIAGNOSIS — M542 Cervicalgia: Secondary | ICD-10-CM | POA: Diagnosis not present

## 2012-03-31 DIAGNOSIS — M545 Low back pain, unspecified: Secondary | ICD-10-CM | POA: Insufficient documentation

## 2012-03-31 DIAGNOSIS — M549 Dorsalgia, unspecified: Secondary | ICD-10-CM | POA: Diagnosis not present

## 2012-03-31 DIAGNOSIS — M961 Postlaminectomy syndrome, not elsewhere classified: Secondary | ICD-10-CM | POA: Insufficient documentation

## 2012-03-31 MED ORDER — HYDROCODONE-ACETAMINOPHEN 5-325 MG PO TABS: 1.0000 | ORAL_TABLET | Freq: Once | ORAL | Status: DC

## 2012-03-31 NOTE — Patient Instructions (Signed)
Try to stay as active as tolerated, look into whether your insurance supports the silver sneaker program,

## 2012-03-31 NOTE — Progress Notes (Signed)
Subjective:    Patient ID: Fred [name], male    DOB: [birth year], 61 y.o.   MRN: [id number]  HPI The patient complains about chronic low back pain. The patient denies any radiation.  The problem has been stable, with having good and bad days.  History of cervical myelopathy and spondylosis underwent cervical laminectomy and fusion C4-7, ACDF by Dr. Yetta Barre in 2012. His neck pain is doing much better.  Headache pain is doing much better. His narcotic analgesic usage is better, taking only about a 1-1/2, 5 mg hydrocodone per day, prn pain, he has several days where he does not take any.  Numbness in R index finger  Pain Inventory Average Pain 4 Pain Right Now 0 My pain is aching  In the last 24 hours, has pain interfered with the following? General activity 6 Relation with others 5 Enjoyment of life 8 What TIME of day is your pain at its worst? evening Sleep (in general) Fair  Pain is worse with: some activites Pain improves with: rest and medication Relief from Meds: 5  Mobility walk without assistance how many minutes can you walk? 10 ability to climb steps?  yes do you drive?  yes transfers alone  Function disabled: date disabled 2001 retired  Neuro/Psych numbness trouble walking loss of taste or smell  Prior Studies Any changes since last visit?  no  Physicians involved in your care Dr Nicholos Johns   Family History  Problem Relation Age of Onset  . Stroke Mother   . Heart disease Father   . Stroke Father     heat stroke  . Kidney disease Brother    History   Social History  . Marital Status: Married    Spouse Name: N/A    Number of Children: N/A  . Years of Education: N/A   Social History Main Topics  . Smoking status: Former Smoker    Types: Cigarettes    Quit date: 05/26/2007  . Smokeless tobacco: Former Neurosurgeon    Quit date: 09/23/1991  . Alcohol Use: None  . Drug Use: None  . Sexually Active: None   Other Topics Concern  . None   Social  History Narrative  . None   Past Surgical History  Procedure Date  . Spine surgery   . Brain surgery    Past Medical History  Diagnosis Date  . Neuromuscular disorder   . Hypertension   . Hyperlipidemia    BP 127/72  Pulse 66  Resp 14  Ht 5\' 9"  (1.753 m)  Wt 181 lb (82.101 kg)  BMI 26.73 kg/m2  SpO2 97%     Review of Systems  Constitutional: Positive for appetite change.  Musculoskeletal: Positive for myalgias, back pain, arthralgias and gait problem.  Neurological: Positive for numbness.  All other systems reviewed and are negative.       Objective:   Physical Exam Constitutional: He is oriented to person, place, and time. He appears well-developed and well-nourished.  HENT:  Head: Normocephalic.  Musculoskeletal: He exhibits tenderness.  Neurological: He is alert and oriented to person, place, and time.  Skin: Skin is warm and dry.  Psychiatric: He has a normal mood and affect.   Symmetric normal motor tone is noted throughout. Normal muscle bulk. Muscle testing reveals 5/5 muscle strength of the upper extremity, and 5/5 of the lower extremity. Full range of motion in upper and lower extremities. ROM of spine is restricted.  DTR in the upper and lower extremity are present and  symmetric 2+. No clonus is noted.  Patient arises from chair without difficulty. Narrow based gait with normal arm swing bilateral , able to walk on heels and toes . Tandem walk is stable. No pronator drift. Rhomberg negative.        Assessment & Plan:  1. Cervical postlaminectomy syndrome. He does have some chronic hand weakness which may be related to a history of myelopathy. In addition he has some new sensory changes in the right index finger, offered further investigation with EMG. he declines at the current time. His neck and UE are doing very well at this time.  UDS negative for hydrocodone, but patient is on a very low dose and took his last tablet six days before the UDS. Patient  tries to take as little of his Hydrocodone 5mg  as possible, refilled his Hydrocodone. Advised patient to start exercise program in the pool, this would be beneficial for his low back pain and his neck, also it would help him to lose some weight which is bothering the patient. I advised the patient to find out whether his insurance support the Silver sneakers program, to prevent high costs.  Followup in 3 month.

## 2012-05-31 ENCOUNTER — Other Ambulatory Visit: Payer: Self-pay

## 2012-05-31 MED ORDER — HYDROCODONE-ACETAMINOPHEN 5-325 MG PO TABS: 1.0000 | ORAL_TABLET | Freq: Once | ORAL | Status: DC

## 2012-06-02 DIAGNOSIS — E785 Hyperlipidemia, unspecified: Secondary | ICD-10-CM | POA: Diagnosis not present

## 2012-07-01 ENCOUNTER — Encounter: Payer: Medicare Other | Admitting: Physical Medicine and Rehabilitation

## 2012-07-05 ENCOUNTER — Encounter
Payer: Medicare Other | Attending: Physical Medicine and Rehabilitation | Admitting: Physical Medicine and Rehabilitation

## 2012-07-05 ENCOUNTER — Encounter: Payer: Self-pay | Admitting: Physical Medicine and Rehabilitation

## 2012-07-05 VITALS — BP 153/74 | HR 60 | Resp 14 | Ht 69.0 in | Wt 182.0 lb

## 2012-07-05 DIAGNOSIS — M545 Low back pain, unspecified: Secondary | ICD-10-CM | POA: Insufficient documentation

## 2012-07-05 DIAGNOSIS — G8929 Other chronic pain: Secondary | ICD-10-CM

## 2012-07-05 DIAGNOSIS — M542 Cervicalgia: Secondary | ICD-10-CM | POA: Diagnosis not present

## 2012-07-05 DIAGNOSIS — R29898 Other symptoms and signs involving the musculoskeletal system: Secondary | ICD-10-CM | POA: Insufficient documentation

## 2012-07-05 DIAGNOSIS — R51 Headache: Secondary | ICD-10-CM | POA: Insufficient documentation

## 2012-07-05 DIAGNOSIS — Z981 Arthrodesis status: Secondary | ICD-10-CM | POA: Insufficient documentation

## 2012-07-05 DIAGNOSIS — M961 Postlaminectomy syndrome, not elsewhere classified: Secondary | ICD-10-CM | POA: Diagnosis not present

## 2012-07-05 MED ORDER — HYDROCODONE-ACETAMINOPHEN 5-325 MG PO TABS: 1.0000 | ORAL_TABLET | Freq: Once | ORAL | Status: DC

## 2012-07-05 NOTE — Progress Notes (Signed)
Subjective:    Patient ID: Fred [name], male    DOB: 1951-02-03, 62 y.o.   MRN: [id number]  HPI The patient complains about chronic low back pain. The patient denies any radiation.  The problem has been stable, with having good and bad days.  History of cervical myelopathy and spondylosis underwent cervical laminectomy and fusion C4-7, ACDF by Dr. Yetta Barre in 2012. His neck pain is doing much better.  Headache pain is doing much better. His narcotic analgesic usage is better, taking only about a 1-1/2, 5 mg hydrocodone per day, prn pain, he has several days where he does not take any.  Numbness in R index finger  Pain Inventory Average Pain 6 Pain Right Now 5 My pain is intermittent  In the last 24 hours, has pain interfered with the following? General activity 6 Relation with others 7 Enjoyment of life 7 What TIME of day is your pain at its worst? evening Sleep (in general) Fair  Pain is worse with: sitting and standing Pain improves with: pacing activities and medication Relief from Meds: 5  Mobility how many minutes can you walk? 10-15 ability to climb steps?  yes  Function disabled: date disabled 2001 I need assistance with the following:  household duties  Neuro/Psych numbness tingling depression anxiety loss of taste or smell  Prior Studies Any changes since last visit?  no  Physicians involved in your care Any changes since last visit?  no   Family History  Problem Relation Age of Onset  . Stroke Mother   . Heart disease Father   . Stroke Father     heat stroke  . Kidney disease Brother    History   Social History  . Marital Status: Married    Spouse Name: N/A    Number of Children: N/A  . Years of Education: N/A   Social History Main Topics  . Smoking status: Former Smoker    Types: Cigarettes    Quit date: 05/26/2007  . Smokeless tobacco: Former Neurosurgeon    Quit date: 09/23/1991  . Alcohol Use: None  . Drug Use: None  . Sexually Active:  None   Other Topics Concern  . None   Social History Narrative  . None   Past Surgical History  Procedure Laterality Date  . Spine surgery    . Brain surgery     Past Medical History  Diagnosis Date  . Neuromuscular disorder   . Hypertension   . Hyperlipidemia    BP 153/74  Pulse 60  Resp 14  Ht 5\' 9"  (1.753 m)  Wt 182 lb (82.555 kg)  BMI 26.86 kg/m2  SpO2 98%     Review of Systems  HENT: Positive for neck pain.   Musculoskeletal: Positive for back pain.  Neurological: Positive for numbness.  Psychiatric/Behavioral: Positive for dysphoric mood. The patient is nervous/anxious.   All other systems reviewed and are negative.       Objective:   Physical Exam Constitutional: He is oriented to person, place, and time. He appears well-developed and well-nourished.  HENT:  Head: Normocephalic.  Musculoskeletal: He exhibits tenderness.  Neurological: He is alert and oriented to person, place, and time.  Skin: Skin is warm and dry.  Psychiatric: He has a normal mood and affect.  Symmetric normal motor tone is noted throughout. Normal muscle bulk. Muscle testing reveals 5/5 muscle strength of the upper extremity, and 5/5 of the lower extremity. Full range of motion in upper and lower extremities. ROM  of spine is restricted.  DTR in the upper and lower extremity are present and symmetric 2+. No clonus is noted.  Patient arises from chair without difficulty. Narrow based gait with normal arm swing bilateral , able to walk on heels and toes . Tandem walk is stable. No pronator drift. Rhomberg negative.        Assessment & Plan:  1. Cervical postlaminectomy syndrome. He does have some chronic hand weakness which may be related to a history of myelopathy. In addition he has some new sensory changes in the right index finger, offered further investigation with EMG. he declines at the current time. His neck and UE are doing very well at this time.  UDS negative for  hydrocodone, but patient is on a very low dose and took his last tablet six days before the UDS. Patient tries to take as little of his Hydrocodone 5mg  as possible, refilled his Hydrocodone. Advised patient to start exercise program in the pool, this would be beneficial for his low back pain and his neck, also it would help him to lose some weight which is bothering the patient. I advised the patient to find out whether his insurance support the Silver sneakers program, to prevent high costs.  Followup in 3 month.

## 2012-07-05 NOTE — Patient Instructions (Signed)
Try to look into the Silver Sneaker's program, also you can try Arnica cream for your arthritic hand and foot cream.

## 2012-07-12 ENCOUNTER — Telehealth: Payer: Self-pay | Admitting: *Deleted

## 2012-07-12 ENCOUNTER — Telehealth: Payer: Self-pay

## 2012-07-12 MED ORDER — HYDROCODONE-ACETAMINOPHEN 5-325 MG PO TABS: ORAL_TABLET | ORAL | Status: DC

## 2012-07-12 NOTE — Telephone Encounter (Signed)
Clydie Braun at [facility] has questions about patients norco directions.

## 2012-07-12 NOTE — Telephone Encounter (Signed)
Mr Delia's rx says Norco 5-325  Take one tablet once  # 45.  This needs correction.

## 2012-07-12 NOTE — Telephone Encounter (Signed)
Pharmacy notified of change in directions

## 2012-07-12 NOTE — Telephone Encounter (Signed)
Please change to 1-1 1/2 per day, #45

## 2012-07-12 NOTE — Telephone Encounter (Signed)
Called pharmacy- clarified directions!

## 2012-08-19 ENCOUNTER — Other Ambulatory Visit: Payer: Self-pay | Admitting: *Deleted

## 2012-08-19 MED ORDER — HYDROCODONE-ACETAMINOPHEN 5-325 MG PO TABS: ORAL_TABLET | ORAL | Status: DC

## 2012-08-19 NOTE — Telephone Encounter (Signed)
Hydrocodone refilled per fax request from pharmacy

## 2012-09-09 DIAGNOSIS — N183 Chronic kidney disease, stage 3 unspecified: Secondary | ICD-10-CM | POA: Diagnosis not present

## 2012-09-09 DIAGNOSIS — F3289 Other specified depressive episodes: Secondary | ICD-10-CM | POA: Diagnosis not present

## 2012-09-09 DIAGNOSIS — E785 Hyperlipidemia, unspecified: Secondary | ICD-10-CM | POA: Diagnosis not present

## 2012-09-09 DIAGNOSIS — I10 Essential (primary) hypertension: Secondary | ICD-10-CM | POA: Diagnosis not present

## 2012-09-09 DIAGNOSIS — E291 Testicular hypofunction: Secondary | ICD-10-CM | POA: Diagnosis not present

## 2012-09-09 DIAGNOSIS — F329 Major depressive disorder, single episode, unspecified: Secondary | ICD-10-CM | POA: Diagnosis not present

## 2012-09-09 DIAGNOSIS — M549 Dorsalgia, unspecified: Secondary | ICD-10-CM | POA: Diagnosis not present

## 2012-09-29 ENCOUNTER — Encounter: Payer: Self-pay | Admitting: Physical Medicine and Rehabilitation

## 2012-09-29 ENCOUNTER — Encounter
Payer: Medicare Other | Attending: Physical Medicine and Rehabilitation | Admitting: Physical Medicine and Rehabilitation

## 2012-09-29 VITALS — BP 124/76 | HR 68 | Resp 14 | Ht 69.0 in | Wt 183.0 lb

## 2012-09-29 DIAGNOSIS — Z79899 Other long term (current) drug therapy: Secondary | ICD-10-CM

## 2012-09-29 DIAGNOSIS — Z5181 Encounter for therapeutic drug level monitoring: Secondary | ICD-10-CM | POA: Diagnosis not present

## 2012-09-29 DIAGNOSIS — M961 Postlaminectomy syndrome, not elsewhere classified: Secondary | ICD-10-CM | POA: Diagnosis not present

## 2012-09-29 DIAGNOSIS — M4802 Spinal stenosis, cervical region: Secondary | ICD-10-CM

## 2012-09-29 DIAGNOSIS — G8929 Other chronic pain: Secondary | ICD-10-CM | POA: Diagnosis not present

## 2012-09-29 DIAGNOSIS — R29898 Other symptoms and signs involving the musculoskeletal system: Secondary | ICD-10-CM | POA: Insufficient documentation

## 2012-09-29 MED ORDER — HYDROCODONE-ACETAMINOPHEN 5-325 MG PO TABS: ORAL_TABLET | ORAL | Status: DC

## 2012-09-29 NOTE — Progress Notes (Signed)
Subjective:    Patient ID: Fred [name], male    DOB: 1950-06-19, 62 y.o.   MRN: [id number]  HPI The patient complains about chronic low back pain. The patient denies any radiation.  The problem has been stable, with having good and bad days.  History of cervical myelopathy and spondylosis underwent cervical laminectomy and fusion C4-7, ACDF by Dr. Yetta Barre in 2012. His neck pain is doing much better.  Headache pain is doing much better. His narcotic analgesic usage is better, taking only about a 1-1/2, 5 mg hydrocodone per day, prn pain, he has several days where he does not take any.  Numbness in R index finger  Pain Inventory Average Pain 5 Pain Right Now 5 My pain is aching  In the last 24 hours, has pain interfered with the following? General activity 6 Relation with others 7 Enjoyment of life 6 What TIME of day is your pain at its worst? evening Sleep (in general) Fair  Pain is worse with: sitting and standing Pain improves with: medication Relief from Meds: 5  Mobility walk without assistance how many minutes can you walk? 10-15 do you drive?  yes  Function disabled: date disabled . I need assistance with the following:  household duties  Neuro/Psych numbness tingling depression anxiety loss of taste or smell  Prior Studies Any changes since last visit?  no  Physicians involved in your care Any changes since last visit?  no   Family History  Problem Relation Age of Onset  . Stroke Mother   . Heart disease Father   . Stroke Father     heat stroke  . Kidney disease Brother    History   Social History  . Marital Status: Married    Spouse Name: N/A    Number of Children: N/A  . Years of Education: N/A   Social History Main Topics  . Smoking status: Former Smoker    Types: Cigarettes    Quit date: 05/26/2007  . Smokeless tobacco: Former Neurosurgeon    Quit date: 09/23/1991  . Alcohol Use: None  . Drug Use: None  . Sexually Active: None   Other  Topics Concern  . None   Social History Narrative  . None   Past Surgical History  Procedure Laterality Date  . Spine surgery    . Brain surgery     Past Medical History  Diagnosis Date  . Neuromuscular disorder   . Hypertension   . Hyperlipidemia    BP 124/76  Pulse 68  Resp 14  Ht 5\' 9"  (1.753 m)  Wt 183 lb (83.008 kg)  BMI 27.01 kg/m2  SpO2 95%     Review of Systems  Constitutional: Positive for appetite change.  Neurological: Positive for numbness.  Psychiatric/Behavioral: Positive for dysphoric mood. The patient is nervous/anxious.   All other systems reviewed and are negative.       Objective:   Physical Exam Constitutional: He is oriented to person, place, and time. He appears well-developed and well-nourished.  HENT:  Head: Normocephalic.  Musculoskeletal: He exhibits tenderness.  Neurological: He is alert and oriented to person, place, and time.  Skin: Skin is warm and dry.  Psychiatric: He has a normal mood and affect.  Symmetric normal motor tone is noted throughout. Normal muscle bulk. Muscle testing reveals 5/5 muscle strength of the upper extremity, and 5/5 of the lower extremity. Full range of motion in upper and lower extremities. ROM of spine is restricted.  DTR in the  upper and lower extremity are present and symmetric 2+. No clonus is noted.  Patient arises from chair without difficulty. Narrow based gait with normal arm swing bilateral , able to walk on heels and toes . Tandem walk is stable. No pronator drift. Rhomberg negative.        Assessment & Plan:  1. Cervical postlaminectomy syndrome. He does have some chronic hand weakness which may be related to a history of myelopathy. In addition he has some new sensory changes in the right index finger, offered further investigation with EMG. he declines at the current time. His neck and UE are doing very well at this time.  UDS negative for hydrocodone, but patient is on a very low dose and  took his last tablet six days before the UDS. Patient tries to take as little of his Hydrocodone 5mg  as possible, refilled his Hydrocodone. Advised patient to start exercise program in the pool, this would be beneficial for his low back pain and his neck, also it would help him to lose some weight which is bothering the patient. Patient is taking fish oil tablets regulary now as I recommended at his last visit, and his last blood work was unremarkable, he also uses the Arnica cream, I suggested for his hands, which gives him relief. Followup in 3 month.

## 2012-09-29 NOTE — Patient Instructions (Signed)
Stay as active as tolerated. 

## 2012-10-06 ENCOUNTER — Telehealth: Payer: Self-pay

## 2012-10-06 NOTE — Telephone Encounter (Signed)
Message copied by Judd Gaudier on Wed Oct 06, 2012  2:12 PM ------      Message from: Su Monks      Created: Tue Oct 05, 2012  4:01 PM       Please educate patient , that he should not drink alcohol with taking narcotics, please tell him we will not prescribe narcotics anymore if he has another UDS with alcohol ------

## 2012-10-06 NOTE — Telephone Encounter (Signed)
Patient educated on alcohol and narcotics and reminded of contract.  Patient swears he does not drink alcohol.

## 2012-12-09 ENCOUNTER — Telehealth: Payer: Self-pay

## 2012-12-09 NOTE — Telephone Encounter (Signed)
Hydrocodone refill request.  Looking at UDS a year ago patient was supposed to be non-narcotic.  Please advise.

## 2012-12-13 NOTE — Telephone Encounter (Signed)
Patient is requesting hydrocodone refill.  Please advise. 

## 2012-12-13 NOTE — Telephone Encounter (Signed)
It is ok to refill, he tries to take his Hydrocodone as little as possible, he was on the Hydrocodone , when i saw him first, he stated, that he had not taken the Hydrocodone for almost 6 days before the one UDS in May 2013.  But we will do an UDS next month when he comes in.

## 2012-12-14 ENCOUNTER — Telehealth: Payer: Self-pay | Admitting: Physical Medicine and Rehabilitation

## 2012-12-15 ENCOUNTER — Encounter: Payer: Self-pay | Admitting: Physical Medicine and Rehabilitation

## 2012-12-15 ENCOUNTER — Encounter
Payer: Medicare Other | Attending: Physical Medicine and Rehabilitation | Admitting: Physical Medicine and Rehabilitation

## 2012-12-15 VITALS — BP 149/93 | HR 84 | Resp 16 | Ht 69.0 in | Wt 176.0 lb

## 2012-12-15 DIAGNOSIS — M961 Postlaminectomy syndrome, not elsewhere classified: Secondary | ICD-10-CM | POA: Insufficient documentation

## 2012-12-15 DIAGNOSIS — R29898 Other symptoms and signs involving the musculoskeletal system: Secondary | ICD-10-CM | POA: Insufficient documentation

## 2012-12-15 DIAGNOSIS — G8929 Other chronic pain: Secondary | ICD-10-CM | POA: Diagnosis not present

## 2012-12-15 MED ORDER — HYDROCODONE-ACETAMINOPHEN 5-325 MG PO TABS: ORAL_TABLET | ORAL | Status: DC

## 2012-12-15 NOTE — Progress Notes (Signed)
Subjective:    Patient ID: Fred [name], male    DOB: 24-Nov-1950, 62 y.o.   MRN: [id number]  HPI The patient complains about chronic low back pain. The patient denies any radiation.  The problem has been stable, with having good and bad days.  History of cervical myelopathy and spondylosis underwent cervical laminectomy and fusion C4-7, ACDF by Dr. Yetta Barre in 2012. His neck pain is doing much better.  Headache pain is doing much better. His narcotic analgesic usage is better, taking only about a 1-1/2, 5 mg hydrocodone per day, prn pain, he has several days where he does not take any.  Numbness in R index finger  Pain Inventory Average Pain 5 Pain Right Now na My pain is aching  In the last 24 hours, has pain interfered with the following? General activity 5 Relation with others 6 Enjoyment of life 5 What TIME of day is your pain at its worst? evening Sleep (in general) NA  Pain is worse with: sitting and standing Pain improves with: na Relief from Meds: na  Mobility walk without assistance how many minutes can you walk? 15 ability to climb steps?  yes do you drive?  yes  Function disabled: date disabled na  Neuro/Psych numbness loss of taste or smell  Prior Studies Any changes since last visit?  no  Physicians involved in your care Any changes since last visit?  no   Family History  Problem Relation Age of Onset  . Stroke Mother   . Heart disease Father   . Stroke Father     heat stroke  . Kidney disease Brother    History   Social History  . Marital Status: Married    Spouse Name: N/A    Number of Children: N/A  . Years of Education: N/A   Social History Main Topics  . Smoking status: Former Smoker    Types: Cigarettes    Quit date: 05/26/2007  . Smokeless tobacco: Former Neurosurgeon    Quit date: 09/23/1991  . Alcohol Use: None  . Drug Use: None  . Sexually Active: None   Other Topics Concern  . None   Social History Narrative  . None    Past Surgical History  Procedure Laterality Date  . Spine surgery    . Brain surgery     Past Medical History  Diagnosis Date  . Neuromuscular disorder   . Hypertension   . Hyperlipidemia    BP 149/93  Pulse 84  Resp 16  Ht 5\' 9"  (1.753 m)  Wt 176 lb (79.833 kg)  BMI 25.98 kg/m2  SpO2 99%     Review of Systems  Constitutional: Positive for appetite change.  Neurological: Positive for numbness.  All other systems reviewed and are negative.       Objective:   Physical Exam Constitutional: He is oriented to person, place, and time. He appears well-developed and well-nourished.  HENT:  Head: Normocephalic.  Musculoskeletal: He exhibits tenderness.  Neurological: He is alert and oriented to person, place, and time.  Skin: Skin is warm and dry.  Psychiatric: He has a normal mood and affect.  Symmetric normal motor tone is noted throughout. Normal muscle bulk. Muscle testing reveals 5/5 muscle strength of the upper extremity, and 5/5 of the lower extremity. Full range of motion in upper and lower extremities. ROM of spine is restricted.  DTR in the upper and lower extremity are present and symmetric 2+. No clonus is noted.  Patient arises from  chair without difficulty. Narrow based gait with normal arm swing bilateral , able to walk on heels and toes . Tandem walk is stable. No pronator drift. Rhomberg negative.        Assessment & Plan:  1. Cervical postlaminectomy syndrome. He does have some chronic hand weakness which may be related to a history of myelopathy. In addition he has some new sensory changes in the right index finger, offered further investigation with EMG. he declines at the current time. His neck and UE are doing very well at this time.  UDS negative for hydrocodone, but patient is on a very low dose and took his last tablet six days before the UDS. Patient tries to take as little of his Hydrocodone 5mg  as possible, refilled his Hydrocodone. Patient had  low alcohol level in his last UDS, he stated that he usually never drinks, but he went to his urologist or PCP, for kidney stones, and this doctor recommended to drink 1-2 beers to flush out the stones/kidney. I educated the patient about risks of taking narcotics and drinking alcohol, I also told him that if we detect this in his UDS again , he will be d/c. I suggested to try to drink alcohol- free beer, to flush his kidneys.  Advised patient to start exercise program in the pool, this would be beneficial for his low back pain and his neck, also it would help him to lose some weight which is bothering the patient. Patient has changed his diet and is more active, he lost 7 lbs since the last visit. Patient is taking fish oil tablets regulary now as I recommended at his last visit, and his last blood work was unremarkable, he also uses the Arnica cream, I suggested for his hands, which gives him relief.  Followup in 3 month.

## 2012-12-15 NOTE — Patient Instructions (Signed)
Continue with your walking program as tolerated

## 2012-12-15 NOTE — Telephone Encounter (Signed)
Pt is here in office for appt.

## 2012-12-17 NOTE — Telephone Encounter (Signed)
Patient was seen in office and given script.

## 2012-12-30 ENCOUNTER — Ambulatory Visit: Payer: PRIVATE HEALTH INSURANCE | Admitting: Physical Medicine and Rehabilitation

## 2013-02-21 ENCOUNTER — Telehealth: Payer: Self-pay

## 2013-02-21 NOTE — Telephone Encounter (Signed)
Patient was requesting a refill on Hydrocodone. Patient had a appt to been seen on 10/14. Trying to schedule him sooner to be seen and to pick up a printed RX of Hydrocodone.

## 2013-02-23 ENCOUNTER — Encounter: Payer: Self-pay | Admitting: Physical Medicine and Rehabilitation

## 2013-02-23 ENCOUNTER — Encounter
Payer: Medicare Other | Attending: Physical Medicine and Rehabilitation | Admitting: Physical Medicine and Rehabilitation

## 2013-02-23 VITALS — BP 117/75 | HR 77 | Resp 14 | Ht 69.0 in | Wt 176.0 lb

## 2013-02-23 DIAGNOSIS — G8929 Other chronic pain: Secondary | ICD-10-CM | POA: Diagnosis not present

## 2013-02-23 DIAGNOSIS — H903 Sensorineural hearing loss, bilateral: Secondary | ICD-10-CM | POA: Diagnosis not present

## 2013-02-23 DIAGNOSIS — Z79899 Other long term (current) drug therapy: Secondary | ICD-10-CM | POA: Diagnosis not present

## 2013-02-23 DIAGNOSIS — M961 Postlaminectomy syndrome, not elsewhere classified: Secondary | ICD-10-CM | POA: Diagnosis not present

## 2013-02-23 DIAGNOSIS — R29898 Other symptoms and signs involving the musculoskeletal system: Secondary | ICD-10-CM | POA: Diagnosis not present

## 2013-02-23 DIAGNOSIS — Z5181 Encounter for therapeutic drug level monitoring: Secondary | ICD-10-CM | POA: Diagnosis not present

## 2013-02-23 MED ORDER — HYDROCODONE-ACETAMINOPHEN 5-325 MG PO TABS: ORAL_TABLET | ORAL | Status: DC

## 2013-02-23 NOTE — Patient Instructions (Signed)
Continue with walking and exercise program as tolerated

## 2013-02-23 NOTE — Progress Notes (Signed)
Subjective:    Patient ID: Fred [name], male    DOB: 1950/10/23, 62 y.o.   MRN: [id number]  HPI The patient complains about chronic low back pain. The patient denies any radiation.  The problem has been stable, with having good and bad days.  History of cervical myelopathy and spondylosis underwent cervical laminectomy and fusion C4-7, ACDF by Dr. Yetta [name] in 2012. His neck pain is doing much better.  Headache pain is doing much better. His narcotic analgesic usage is better, taking only about a 1-1/2, 5 mg hydrocodone per day, prn pain, he has several days where he does not take any.  Numbness in R index finger  Pain Inventory Average Pain 5 Pain Right Now 5 My pain is tingling and aching  In the last 24 hours, has pain interfered with the following? General activity 3 Relation with others 3 Enjoyment of life 3 What TIME of day is your pain at its worst? evening Sleep (in general) Fair  Pain is worse with: bending Pain improves with: medication Relief from Meds: 8  Mobility walk without assistance how many minutes can you walk? 15 do you drive?  yes  Function disabled: date disabled na I need assistance with the following:  meal prep, household duties and shopping  Neuro/Psych depression loss of taste or smell  Prior Studies Any changes since last visit?  no  Physicians involved in your care Any changes since last visit?  no   Family History  Problem Relation Age of Onset  . Stroke Mother   . Heart disease Father   . Stroke Father     heat stroke  . Kidney disease Brother    History   Social History  . Marital Status: Married    Spouse Name: N/A    Number of Children: N/A  . Years of Education: N/A   Social History Main Topics  . Smoking status: Former Smoker    Types: Cigarettes    Quit date: 05/26/2007  . Smokeless tobacco: Former Neurosurgeon    Quit date: 09/23/1991  . Alcohol Use: Not on file  . Drug Use: Not on file  . Sexual Activity: Not on  file   Other Topics Concern  . Not on file   Social History Narrative  . No narrative on file   Past Surgical History  Procedure Laterality Date  . Spine surgery    . Brain surgery     Past Medical History  Diagnosis Date  . Neuromuscular disorder   . Hypertension   . Hyperlipidemia    There were no vitals taken for this visit.      Review of Systems  HENT:       Loss of smell or taste  Musculoskeletal: Positive for back pain and neck pain.  Psychiatric/Behavioral: Positive for dysphoric mood.       Objective:   Physical Exam Constitutional: He is oriented to person, place, and time. He appears well-developed and well-nourished.  HENT:  Head: Normocephalic.  Musculoskeletal: He exhibits tenderness.  Neurological: He is alert and oriented to person, place, and time.  Skin: Skin is warm and dry.  Psychiatric: He has a normal mood and affect.  Symmetric normal motor tone is noted throughout. Normal muscle bulk. Muscle testing reveals 5/5 muscle strength of the upper extremity, and 5/5 of the lower extremity. Full range of motion in upper and lower extremities. ROM of spine is restricted.  DTR in the upper and lower extremity are present and symmetric  2+. No clonus is noted.  Patient arises from chair without difficulty. Narrow based gait with normal arm swing bilateral , able to walk on heels and toes . Tandem walk is stable. No pronator drift. Rhomberg negative.        Assessment & Plan:  1. Cervical postlaminectomy syndrome. He does have some chronic hand weakness which may be related to a history of myelopathy. In addition he has some new sensory changes in the right index finger, offered further investigation with EMG. he declines at the current time. His neck and UE are doing very well at this time.  UDS negative for hydrocodone, but patient is on a very low dose and took his last tablet six days before the UDS. Patient tries to take as little of his Hydrocodone  5mg  as possible, refilled his Hydrocodone.  Patient had low alcohol level in his last UDS, he stated that he usually never drinks, but he went to his urologist or PCP, for kidney stones, and this doctor recommended to drink 1-2 beers to flush out the stones/kidney. I educated the patient about risks of taking narcotics and drinking alcohol, I also told him that if we detect this in his UDS again , he will be d/c. I suggested to try to drink alcohol- free beer, to flush his kidneys.  Advised patient to start exercise program in the pool, this would be beneficial for his low back pain and his neck, also it would help him to lose some weight which is bothering the patient. Patient has changed his diet and is more active, he lost 7 lbs since the last visit. Showed him exercises for his neck and shoulders with a rubber band today. Patient is taking fish oil tablets regulary now as I recommended at his last visit, and his last blood work was unremarkable, he also uses the Arnica cream, I suggested for his hands, which gives him relief.  Followup in 1 month.

## 2013-03-01 ENCOUNTER — Ambulatory Visit: Payer: PRIVATE HEALTH INSURANCE | Admitting: Physical Medicine and Rehabilitation

## 2013-03-02 ENCOUNTER — Ambulatory Visit: Payer: PRIVATE HEALTH INSURANCE | Admitting: Physical Medicine and Rehabilitation

## 2013-03-15 DIAGNOSIS — Z23 Encounter for immunization: Secondary | ICD-10-CM | POA: Diagnosis not present

## 2013-03-15 DIAGNOSIS — Z0000 Encounter for general adult medical examination without abnormal findings: Secondary | ICD-10-CM | POA: Diagnosis not present

## 2013-03-15 DIAGNOSIS — F3289 Other specified depressive episodes: Secondary | ICD-10-CM | POA: Diagnosis not present

## 2013-03-15 DIAGNOSIS — E785 Hyperlipidemia, unspecified: Secondary | ICD-10-CM | POA: Diagnosis not present

## 2013-03-15 DIAGNOSIS — F329 Major depressive disorder, single episode, unspecified: Secondary | ICD-10-CM | POA: Diagnosis not present

## 2013-03-15 DIAGNOSIS — I10 Essential (primary) hypertension: Secondary | ICD-10-CM | POA: Diagnosis not present

## 2013-03-15 DIAGNOSIS — M549 Dorsalgia, unspecified: Secondary | ICD-10-CM | POA: Diagnosis not present

## 2013-03-15 DIAGNOSIS — N183 Chronic kidney disease, stage 3 unspecified: Secondary | ICD-10-CM | POA: Diagnosis not present

## 2013-03-15 DIAGNOSIS — Z125 Encounter for screening for malignant neoplasm of prostate: Secondary | ICD-10-CM | POA: Diagnosis not present

## 2013-03-15 DIAGNOSIS — N529 Male erectile dysfunction, unspecified: Secondary | ICD-10-CM | POA: Diagnosis not present

## 2013-03-15 DIAGNOSIS — E291 Testicular hypofunction: Secondary | ICD-10-CM | POA: Diagnosis not present

## 2013-03-31 ENCOUNTER — Encounter (INDEPENDENT_AMBULATORY_CARE_PROVIDER_SITE_OTHER): Payer: Self-pay

## 2013-03-31 ENCOUNTER — Ambulatory Visit (HOSPITAL_COMMUNITY)
Admission: RE | Admit: 2013-03-31 | Discharge: 2013-03-31 | Disposition: A | Discharge status: Home / Self Care | Payer: Medicare Other | Source: Ambulatory Visit | Attending: Physical Medicine and Rehabilitation | Admitting: Physical Medicine and Rehabilitation

## 2013-03-31 ENCOUNTER — Other Ambulatory Visit: Payer: Self-pay | Admitting: Physical Medicine and Rehabilitation

## 2013-03-31 ENCOUNTER — Encounter: Payer: Self-pay | Admitting: Physical Medicine and Rehabilitation

## 2013-03-31 ENCOUNTER — Encounter
Payer: Medicare Other | Attending: Physical Medicine and Rehabilitation | Admitting: Physical Medicine and Rehabilitation

## 2013-03-31 VITALS — BP 137/86 | HR 84 | Resp 14 | Ht 67.5 in | Wt 178.0 lb

## 2013-03-31 DIAGNOSIS — M546 Pain in thoracic spine: Secondary | ICD-10-CM

## 2013-03-31 DIAGNOSIS — W19XXXA Unspecified fall, initial encounter: Secondary | ICD-10-CM | POA: Insufficient documentation

## 2013-03-31 DIAGNOSIS — M545 Low back pain, unspecified: Secondary | ICD-10-CM

## 2013-03-31 DIAGNOSIS — X58XXXA Exposure to other specified factors, initial encounter: Secondary | ICD-10-CM | POA: Insufficient documentation

## 2013-03-31 DIAGNOSIS — M961 Postlaminectomy syndrome, not elsewhere classified: Secondary | ICD-10-CM | POA: Diagnosis not present

## 2013-03-31 DIAGNOSIS — S32009A Unspecified fracture of unspecified lumbar vertebra, initial encounter for closed fracture: Secondary | ICD-10-CM | POA: Diagnosis not present

## 2013-03-31 DIAGNOSIS — IMO0002 Reserved for concepts with insufficient information to code with codable children: Secondary | ICD-10-CM | POA: Diagnosis not present

## 2013-03-31 DIAGNOSIS — S32020A Wedge compression fracture of second lumbar vertebra, initial encounter for closed fracture: Secondary | ICD-10-CM

## 2013-03-31 DIAGNOSIS — M549 Dorsalgia, unspecified: Secondary | ICD-10-CM | POA: Diagnosis not present

## 2013-03-31 MED ORDER — METHYLPREDNISOLONE (PAK) 4 MG PO TABS: ORAL_TABLET | ORAL | Status: DC

## 2013-03-31 MED ORDER — METHOCARBAMOL 500 MG PO TABS: 500.0000 mg | ORAL_TABLET | Freq: Three times a day (TID) | ORAL | Status: DC

## 2013-03-31 MED ORDER — HYDROCODONE-ACETAMINOPHEN 5-325 MG PO TABS: ORAL_TABLET | ORAL | Status: DC

## 2013-03-31 NOTE — Patient Instructions (Signed)
Try to rest with your legs elevated, and try to use a heating pad. Do not take the naproxen as long as you take the steroid dose pack ! Go to the ED if your pain gets more severe, or if you notice radiating symptoms in your legs.

## 2013-03-31 NOTE — Progress Notes (Addendum)
Subjective:    Patient ID: Fred [name], male    DOB: 01/26/1951, 62 y.o.   MRN: [id number]  HPI  The patient complains about chronic low back pain.This has flared up after he stepped on an edge, and slid down a hill on his butt. This happened one week ago. Sx radiated up his spine, spasms mainly. He had also some numbness in his arms bilateral which has resolved. No radiating symptoms into his LEs.    History of cervical myelopathy and spondylosis underwent cervical laminectomy and fusion C4-7, ACDF by Dr. Yetta Barre in 2012. His neck pain is doing much better.  He has been taking only about a 1-1/2, 5 mg hydrocodone per day,but with this increased pain after his fall he has been taking 4 tablets a day. Numbness in R index finger  Pain Inventory Average Pain 9 Pain Right Now 9 My pain is constant  In the last 24 hours, has pain interfered with the following? General activity 1 Relation with others 1 Enjoyment of life 1 What TIME of day is your pain at its worst? all Sleep (in general) Fair  Pain is worse with: bending Pain improves with: medication Relief from Meds: 3  Mobility walk without assistance how many minutes can you walk? 5  Function disabled: date disabled 2001 I need assistance with the following:  dressing, meal prep, household duties and shopping  Neuro/Psych numbness trouble walking spasms depression loss of taste or smell  Prior Studies Any changes since last visit?  no  Physicians involved in your care Any changes since last visit?  no   Family History  Problem Relation Age of Onset  . Stroke Mother   . Heart disease Father   . Stroke Father     heat stroke  . Kidney disease Brother    History   Social History  . Marital Status: Married    Spouse Name: N/A    Number of Children: N/A  . Years of Education: N/A   Social History Main Topics  . Smoking status: Former Smoker    Types: Cigarettes    Quit date: 05/26/2007  . Smokeless  tobacco: Former Neurosurgeon    Quit date: 09/23/1991  . Alcohol Use: None  . Drug Use: None  . Sexual Activity: None   Other Topics Concern  . None   Social History Narrative  . None   Past Surgical History  Procedure Laterality Date  . Spine surgery    . Brain surgery     Past Medical History  Diagnosis Date  . Neuromuscular disorder   . Hypertension   . Hyperlipidemia    BP 137/86  Pulse 84  Resp 14  Ht 5' 7.5" (1.715 m)  Wt 178 lb (80.74 kg)  BMI 27.45 kg/m2  SpO2 96%    Review of Systems  Musculoskeletal: Positive for back pain and gait problem.       Spasms  Neurological: Positive for numbness.  Psychiatric/Behavioral: Positive for dysphoric mood.  All other systems reviewed and are negative.       Objective:   Physical Exam Constitutional: He is oriented to person, place, and time. He appears well-developed and well-nourished.  HENT:  Head: Normocephalic.  Musculoskeletal: He exhibits tenderness,on palpation and percussion in the area of T10-L4 Neurological: He is alert and oriented to person, place, and time.  Skin: Skin is warm and dry.  Psychiatric: He has a normal mood and affect.  Symmetric normal motor tone is noted throughout. Normal  muscle bulk. Muscle testing reveals 5/5 muscle strength of the upper extremity, and 5/5 of the lower extremity. Full range of motion in upper and lower extremities. ROM of spine is restricted. Pain with rotation of T-spine in both directions. DTR in the upper and lower extremity are present and symmetric 2+. No clonus is noted.  Patient arises from chair without difficulty. Antalgic gait .        Assessment & Plan:  1. Cervical postlaminectomy syndrome. He does have some chronic hand weakness which may be related to a history of myelopathy. In addition he has some new sensory changes in the right index finger, offered further investigation with EMG. he declines at the current time. His neck and UE are doing very well at  this time.  2. Exacerbation of his back pain after a fall one week ago. Ordered X-rays of T- and L-spine, to rule out fracture. Prescribed a steroid dose pack , advised patient NOT to take the naproxen while he is taking the dose pack. Prescribed Robaxin 500mg , tid prn for his muscle spasms/pain. Increased his Hydrocodone 5mg , from 1-2 tablets a day to 4 tablets q 4-6 hrs, prn pain. Told patient that this is for the next month only,for this exacerbation of his pain, then we will get back to his former dose . Will call patient after we have the x-ray results, consider further treatment depending on results. Also advised patient to call or go to the ED if his pain increases and/or he develops radiating Sx into his LEs Addendum : X-rays showed mild L2 compression fracture, ordered lumbar brace, other wise the same instructions as above, also told patient he should not lift anything heave , should not bend, twist or stoop . Consider further treatments, e.g. Referral to spine specialist, kyphoplastic, if patient does not improve in the next 6-8 weeks.    UDS negative for hydrocodone, but patient is on a very low dose and took his last tablet six days before the UDS. Patient tries to take as little of his Hydrocodone 5mg  as possible, refilled his Hydrocodone.  Patient had low alcohol level in his last UDS, he stated that he usually never drinks, but he went to his urologist or PCP, for kidney stones, and this doctor recommended to drink 1-2 beers to flush out the stones/kidney. I educated the patient about risks of taking narcotics and drinking alcohol, I also told him that if we detect this in his UDS again , he will be d/c. I suggested to try to drink alcohol- free beer, to flush his kidneys.    Patient is taking fish oil tablets regulary now as I recommended at his last visit, and his last blood work was unremarkable, he also uses the Arnica cream, I suggested for his hands, which gives him relief.   Followup in 1 month, or earlier if necessary.

## 2013-04-01 ENCOUNTER — Telehealth: Payer: Self-pay

## 2013-04-01 NOTE — Addendum Note (Signed)
Addended by: Su Monks on: 04/01/2013 10:25 AM   Modules accepted: Orders

## 2013-04-01 NOTE — Telephone Encounter (Signed)
Message copied by Judd Gaudier on Fri Apr 01, 2013  9:40 AM ------      Message from: Su Monks      Created: Thu Mar 31, 2013  4:19 PM       Tried to call patient with results, only got the answering machine , will call him again tomorrow ------

## 2013-04-01 NOTE — Telephone Encounter (Signed)
Patient informed of imaging results.  Fred [name] spoke to him.

## 2013-04-21 ENCOUNTER — Ambulatory Visit: Payer: PRIVATE HEALTH INSURANCE | Admitting: Physical Medicine and Rehabilitation

## 2013-04-25 DIAGNOSIS — R972 Elevated prostate specific antigen [PSA]: Secondary | ICD-10-CM | POA: Diagnosis not present

## 2013-05-02 ENCOUNTER — Ambulatory Visit: Payer: PRIVATE HEALTH INSURANCE | Admitting: Physical Medicine & Rehabilitation

## 2013-05-03 ENCOUNTER — Ambulatory Visit (HOSPITAL_BASED_OUTPATIENT_CLINIC_OR_DEPARTMENT_OTHER): Payer: Medicare Other | Admitting: Physical Medicine & Rehabilitation

## 2013-05-03 ENCOUNTER — Encounter: Payer: Self-pay | Admitting: Physical Medicine & Rehabilitation

## 2013-05-03 ENCOUNTER — Encounter: Payer: Medicare Other | Attending: Physical Medicine and Rehabilitation

## 2013-05-03 VITALS — BP 125/79 | HR 71 | Resp 14 | Ht 67.0 in | Wt 181.0 lb

## 2013-05-03 DIAGNOSIS — S32020A Wedge compression fracture of second lumbar vertebra, initial encounter for closed fracture: Secondary | ICD-10-CM | POA: Insufficient documentation

## 2013-05-03 DIAGNOSIS — IMO0002 Reserved for concepts with insufficient information to code with codable children: Secondary | ICD-10-CM | POA: Diagnosis not present

## 2013-05-03 DIAGNOSIS — M961 Postlaminectomy syndrome, not elsewhere classified: Secondary | ICD-10-CM

## 2013-05-03 DIAGNOSIS — G8929 Other chronic pain: Secondary | ICD-10-CM | POA: Diagnosis not present

## 2013-05-03 DIAGNOSIS — M4802 Spinal stenosis, cervical region: Secondary | ICD-10-CM | POA: Diagnosis not present

## 2013-05-03 DIAGNOSIS — R29898 Other symptoms and signs involving the musculoskeletal system: Secondary | ICD-10-CM | POA: Insufficient documentation

## 2013-05-03 DIAGNOSIS — S32020D Wedge compression fracture of second lumbar vertebra, subsequent encounter for fracture with routine healing: Secondary | ICD-10-CM

## 2013-05-03 MED ORDER — HYDROCODONE-ACETAMINOPHEN 5-325 MG PO TABS: ORAL_TABLET | ORAL | Status: DC

## 2013-05-03 NOTE — Patient Instructions (Signed)
May reduce methocarbamol to twice a day  Continue hydrocodone 4 times per day  Return to clinic one month

## 2013-05-03 NOTE — Progress Notes (Signed)
Subjective:    Patient ID: Fred [name], male    DOB: October 22, 1950, 62 y.o.   MRN: [id number]  HPI Stepped in a hole while hunting with immediate onset of low back pain. L2 comp fx <25% height, no evidence of retropulsion No lower ext symptoms  Has elevated PSA, will be seeing urologist soon.  Reduced activity level.  Feeling down  ROS- feet fel cold since frostbite years ago Pain Inventory Average Pain 8 Pain Right Now 6 My pain is sharp and aching  In the last 24 hours, has pain interfered with the following? General activity 0 Relation with others 2 Enjoyment of life 1 What TIME of day is your pain at its worst? day, evening, night Sleep (in general) Fair  Pain is worse with: walking, bending and standing Pain improves with: rest and medication Relief from Meds: 8  Mobility walk without assistance how many minutes can you walk? 5 transfers alone  Function disabled: date disabled 2001 I need assistance with the following:  dressing, meal prep, household duties and shopping  Neuro/Psych numbness trouble walking spasms  Prior Studies Any changes since last visit?  no CLINICAL DATA: Status post fall. Low back pain.  EXAM:  LUMBAR SPINE - COMPLETE 4+ VIEW  COMPARISON: Two views lumbar spine 10/15/2010.  FINDINGS:  The patient has a mild superior endplate compression fracture of L2  which is new since the prior examination but remains age  indeterminate. No bony retropulsion is identified. Vertebral body  height loss is estimated at 15% anteriorly. Vertebral body height is  otherwise maintained. Facet arthropathy is identified and most  notable at L5-S1. Transitional anatomy at the lumbosacral junction  is again seen. Numbering scheme is the same as that used on the  prior study.  IMPRESSION:  Mild superior endplate compression fracture of L2 is new since the  comparison study but remains age indeterminate. The patient's  examination is otherwise  unchanged.  Physicians involved in your care Any changes since last visit?  no   Family History  Problem Relation Age of Onset  . Stroke Mother   . Heart disease Father   . Stroke Father     heat stroke  . Kidney disease Brother    History   Social History  . Marital Status: Married    Spouse Name: N/A    Number of Children: N/A  . Years of Education: N/A   Social History Main Topics  . Smoking status: Former Smoker    Types: Cigarettes    Quit date: 05/26/2007  . Smokeless tobacco: Former Neurosurgeon    Quit date: 09/23/1991  . Alcohol Use: None  . Drug Use: None  . Sexual Activity: None   Other Topics Concern  . None   Social History Narrative  . None   Past Surgical History  Procedure Laterality Date  . Spine surgery    . Brain surgery     Past Medical History  Diagnosis Date  . Neuromuscular disorder   . Hypertension   . Hyperlipidemia    BP 125/79  Pulse 71  Resp 14  Ht 5\' 7"  (1.702 m)  Wt 181 lb (82.101 kg)  BMI 28.34 kg/m2  SpO2 96%     Review of Systems  Musculoskeletal: Positive for back pain and gait problem.  Neurological: Positive for numbness.       Spasms  All other systems reviewed and are negative.       Objective:   Physical Exam  Mild tenderness to palpation around L2-L3 and L4 paraspinal as well spinous process area. Is able to bend forward about 50% of normal flexion. Extension is about 50% as well. And rates pain in both directions. Negative straight leg raising test Normal deep tendon reflexes Normal strength in lower tremor these. Normal sensation lower extremities Ambulation without evidence of toe drag or knee instability no antalgic       Assessment & Plan:  1. L2 compression fracture, cannot definitely state the onset however appears to have occurred in November when he stepped in a hole and had immediate onset of pain. Will expect about a 12 week healing.No neurologic symptoms.  2.  Cervical post lami syndrome,  will cont current Norco 5mg  QID, no signs of misuse recent UDS appropriate

## 2013-05-26 DIAGNOSIS — N402 Nodular prostate without lower urinary tract symptoms: Secondary | ICD-10-CM | POA: Diagnosis not present

## 2013-05-26 DIAGNOSIS — R3989 Other symptoms and signs involving the genitourinary system: Secondary | ICD-10-CM | POA: Diagnosis not present

## 2013-05-26 DIAGNOSIS — Z87442 Personal history of urinary calculi: Secondary | ICD-10-CM | POA: Diagnosis not present

## 2013-05-26 DIAGNOSIS — R972 Elevated prostate specific antigen [PSA]: Secondary | ICD-10-CM | POA: Diagnosis not present

## 2013-06-02 DIAGNOSIS — R972 Elevated prostate specific antigen [PSA]: Secondary | ICD-10-CM | POA: Diagnosis not present

## 2013-06-02 DIAGNOSIS — C61 Malignant neoplasm of prostate: Secondary | ICD-10-CM | POA: Diagnosis not present

## 2013-06-16 DIAGNOSIS — C61 Malignant neoplasm of prostate: Secondary | ICD-10-CM | POA: Diagnosis not present

## 2013-07-13 DIAGNOSIS — C61 Malignant neoplasm of prostate: Secondary | ICD-10-CM | POA: Diagnosis not present

## 2013-07-19 DIAGNOSIS — C61 Malignant neoplasm of prostate: Secondary | ICD-10-CM | POA: Diagnosis not present

## 2013-07-20 ENCOUNTER — Other Ambulatory Visit: Payer: Self-pay | Admitting: Urology

## 2013-07-28 ENCOUNTER — Other Ambulatory Visit (HOSPITAL_COMMUNITY): Payer: Self-pay | Admitting: Urology

## 2013-07-28 ENCOUNTER — Encounter (HOSPITAL_COMMUNITY): Payer: Self-pay | Admitting: Pharmacy Technician

## 2013-07-28 DIAGNOSIS — C61 Malignant neoplasm of prostate: Secondary | ICD-10-CM | POA: Diagnosis not present

## 2013-07-28 DIAGNOSIS — R279 Unspecified lack of coordination: Secondary | ICD-10-CM | POA: Diagnosis not present

## 2013-07-28 DIAGNOSIS — M6281 Muscle weakness (generalized): Secondary | ICD-10-CM | POA: Diagnosis not present

## 2013-07-28 NOTE — Patient Instructions (Signed)
      Your procedure is scheduled on: 08/04/13 THURS   Report to [place] at  0830     AM.  Call this number if you have problems the morning of surgery: [phone]     BOWEL PREP AS PRE OFFICE   Do not TAKE ANYTHING BY MOUTH :After Midnight. Wednesday NIGHT   Take these medicines the morning of surgery with A SIP OF WATER:NO REGULAR MEDS May take Norco if needed   .  Contacts, dentures or partial plates, or metal hairpins  can not be worn to surgery. Your family will be responsible for glasses, dentures, hearing aides while you are in surgery  Leave suitcase in the car. After surgery it may be brought to your room.  For patients admitted to the hospital, checkout time is 11:00 AM day of  discharge.                DO NOT WEAR JEWELRY, LOTIONS, POWDERS, OR PERFUMES.  WOMEN-- DO NOT SHAVE LEGS OR UNDERARMS FOR 48 HOURS BEFORE SHOWERS. MEN MAY SHAVE FACE.  Patients discharged the day of surgery will not be allowed to drive home. IF going home the day of surgery, you must have a driver and someone to stay with you for the first 24 hours  Name and phone number of your driver:     ADMISSION                                                                   Please read over the following fact sheets that you were given:Incentive Spirometry Sheet, Blood Transfusion Sheet  Information                     FAILURE TO [place]                                                  Patient Signature _____________________________

## 2013-08-01 ENCOUNTER — Encounter (HOSPITAL_COMMUNITY): Payer: Self-pay

## 2013-08-01 ENCOUNTER — Encounter (HOSPITAL_COMMUNITY)
Admission: RE | Admit: 2013-08-01 | Discharge: 2013-08-01 | Disposition: A | Discharge status: Home / Self Care | Payer: Medicare Other | Source: Ambulatory Visit | Attending: Urology | Admitting: Urology

## 2013-08-01 ENCOUNTER — Ambulatory Visit (HOSPITAL_COMMUNITY)
Admission: RE | Admit: 2013-08-01 | Discharge: 2013-08-01 | Disposition: A | Discharge status: Home / Self Care | Payer: Medicare Other | Source: Ambulatory Visit | Attending: Urology | Admitting: Urology

## 2013-08-01 DIAGNOSIS — Z01818 Encounter for other preprocedural examination: Secondary | ICD-10-CM | POA: Insufficient documentation

## 2013-08-01 DIAGNOSIS — I4510 Unspecified right bundle-branch block: Secondary | ICD-10-CM | POA: Insufficient documentation

## 2013-08-01 DIAGNOSIS — Z01810 Encounter for preprocedural cardiovascular examination: Secondary | ICD-10-CM | POA: Insufficient documentation

## 2013-08-01 DIAGNOSIS — Z01812 Encounter for preprocedural laboratory examination: Secondary | ICD-10-CM | POA: Diagnosis not present

## 2013-08-01 HISTORY — DX: Personal history of urinary calculi: Z87.442

## 2013-08-01 HISTORY — DX: Unspecified hearing loss, unspecified ear: H91.90

## 2013-08-01 HISTORY — DX: Depression, unspecified: F32.A

## 2013-08-01 HISTORY — DX: Major depressive disorder, single episode, unspecified: F32.9

## 2013-08-01 LAB — BASIC METABOLIC PANEL
BUN: 12 mg/dL (ref 6–23)
CHLORIDE: 99 meq/L (ref 96–112)
CO2: 26 meq/L (ref 19–32)
Calcium: 10 mg/dL (ref 8.4–10.5)
Creatinine, Ser: 1.11 mg/dL (ref 0.50–1.35)
GFR calc Af Amer: 79 mL/min — ABNORMAL LOW (ref >90)
GFR calc non Af Amer: 68 mL/min — ABNORMAL LOW (ref >90)
Glucose, Bld: 96 mg/dL (ref 70–99)
Potassium: 4.3 mEq/L (ref 3.7–5.3)
SODIUM: 139 meq/L (ref 137–147)

## 2013-08-01 LAB — CBC
HCT: 45.2 % (ref 39.0–52.0)
Hemoglobin: 15.5 g/dL (ref 13.0–17.0)
MCH: 29.8 pg (ref 26.0–34.0)
MCHC: 34.3 g/dL (ref 30.0–36.0)
MCV: 86.8 fL (ref 78.0–100.0)
Platelets: 186 10*3/uL (ref 150–400)
RBC: 5.21 MIL/uL (ref 4.22–5.81)
RDW: 12.9 % (ref 11.5–15.5)
WBC: 6.8 10*3/uL (ref 4.0–10.5)

## 2013-08-01 LAB — ABO/RH: ABO/RH(D): A NEG

## 2013-08-03 NOTE — H&P (Signed)
Chief Complaint Prostate Cancer   Reason For Visit Reason for consult: To discuss treatment options for prostate cancer and to consider surgical treatment with a robotic prostatectomy.  Physician requesting consult: Dr. Kathie Rhodes  PCP: Dr. Maury Dus   History of Present Illness Fred [name] is a 63 year old gentleman who was noted to have a persistently elevated PSA of 5.40 and right apical induration of the prostate prompting a prostate needle biopsy on 06/02/13. This confirmed Gleason 4+3=7 adenocarcinoma of the prostate with 6 out of 12 biopsy cores positive for malignancy. He has a family history of prostate cancer with his brother having been diagnosed at age 5 who was treated with surgery and adjuvant radiation. His comorbidities include a history of dyslipidemia, depression, hypertension, and GERD.     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 He also has a history of testosterone deficiency and has been treated with Androgel in the past but has now stopped therapy. This was prescribed by Dr. [name] for symptoms including fatigue. He did not feel that this significantly improved his subjective symptoms.    He does take hydrocodone for chronic back pain. He also has a hearing aid which is attached to a permanently placed metal rod located in the skull.    TNM stage: cT2a Nx Mx (R apical induration)  PSA: 5.4  Gleason score: 4+3=7  Biopsy (06/02/13): 6/12 cores positive    Left: L lateral apex (5%, 3+3=6), L apex (< 5%, 3+3=6)    Right: R apex (90%, 3+3=6), R lateral apex (70%, 3+4=7), R mid (20%, 4+3=7, PNI), R lateral mid (50%, 3+4=7, PNI)  Prostate volume: 33.9 cc    Nomogram  OC disease: 40%  EPE: 56%  LNI: 14%  SVI: 9%  PFS (surgery): 63% at 4 years, 47% at 9 years    Urinary function: He has significant LUTS. He has been unable to tolerate Myrbetriq due to side  effects. IPSS is 27.  Erectile function: He has erectile dysfunction that has been treated with sildenafil. SHIM score is 15. He estimates that he can obtain an erection adequate for intercourse approximately 4 out of 10 times. This is significantly improved with sildenafil.   Past Medical History Problems  1. History of Anxiety (300.00) 2. History of arthritis (V13.4) 3. History of depression (V11.8) 4. History of esophageal reflux (V12.79) 5. History of glaucoma (V12.49) 6. History of hyperlipidemia (V12.29) 7. History of hypertension (V12.59) 8. History of renal calculi (V13.01) 9. History of urinary stone (V13.01)  Surgical History Problems  1. History of Biopsy Of The Prostate Needle 2. History of Neck Surgery 3. History of Pyeloplasty  Current Meds 1. Cymbalta 60 MG Oral Capsule Delayed Release Particles;  Therapy: (Recorded:08Jan2015) to Recorded 2. DULoxetine HCl - 60 MG Oral Capsule Delayed Release Particles;  Therapy: (Recorded:08Jan2015) to Recorded 3. Lipitor 40 MG Oral Tablet;  Therapy: (Recorded:08Jan2015) to Recorded 4. Lisinopril-Hydrochlorothiazide 20-12.5 MG Oral Tablet;  Therapy: (Recorded:08Jan2015) to Recorded 5. Sildenafil Citrate 20 MG Oral Tablet;  Therapy: (Recorded:08Jan2015) to Recorded 6. Tylenol 325 MG Oral Tablet;  Therapy: (Recorded:08Jan2015) to Recorded 7. Vicodin 5-500 MG TABS;  Therapy: (Recorded:08Jan2015) to Recorded  Allergies Medication  1. Lyrica CAPS 2. NSAIDs 3. Percodan  Family History Problems  1. Family history of colon cancer (V16.0) : Mother 2. Family history of kidney cancer (V16.51) : Brother 3. Family history of prostate cancer [id number]) : Brother  Social History  Problems    Denied: History of Alcohol use   Caffeine use (V49.89)   Former smoker (V15.82)   Married  Review of Systems Constitutional, skin, eye, otolaryngeal, hematologic/lymphatic, cardiovascular, pulmonary, endocrine, musculoskeletal,  gastrointestinal, neurological and psychiatric system(s) were reviewed and pertinent findings if present are noted.    Vitals Vital Signs [Data Includes: Last 1 Day]  Recorded: [id number] 02:04PM  Weight: 182 lb  BMI Calculated: 27.67 BSA Calculated: 1.96 Blood Pressure: 127 / 82 Heart Rate: 82  Physical Exam Constitutional: Well nourished and well developed . No acute distress.  ENT:. The ears and nose are normal in appearance.  Neck: The appearance of the neck is normal and no neck mass is present.  Pulmonary: No respiratory distress, normal respiratory rhythm and effort and clear bilateral breath sounds.  Cardiovascular: Heart rate and rhythm are normal . No peripheral edema.  Abdomen: The abdomen is soft and nontender. No masses are palpated. No CVA tenderness. No hernias are palpable. No hepatosplenomegaly noted.  Rectal: Rectal exam demonstrates normal sphincter tone, no tenderness and no masses. Prostate size is estimated to be 40 g. He does have a small area of induration located toward the right apex. This does appear to be peripheral although is confined to the prostate. This measures approximately 1 cm in largest diameter. The prostate is not tender. The left seminal vesicle is nonpalpable. The right seminal vesicle is nonpalpable. The perineum is normal on inspection.  Lymphatics: The femoral and inguinal nodes are not enlarged or tender.  Skin: Normal skin turgor, no visible rash and no visible skin lesions.  Neuro/Psych:. Mood and affect are appropriate.    Results/Data Urine [Data Includes: Last 1 Day]   [id number]  COLOR YELLOW   APPEARANCE CLEAR   SPECIFIC GRAVITY 1.020   pH 5.5   GLUCOSE NEG mg/dL  BILIRUBIN NEG   KETONE NEG mg/dL  BLOOD NEG   PROTEIN NEG mg/dL  UROBILINOGEN 0.2 mg/dL  NITRITE NEG   LEUKOCYTE ESTERASE NEG    I have reviewed his medical records, PSA results, and pathology report. Findings are as dictated above.   Assessment Assessed  1.  Adenocarcinoma of prostate (185)  Plan Adenocarcinoma of prostate  1. Follow-up Schedule Surgery Office  Follow-up  Status: Hold For - Appointment   Requested for: [id number] 2. PT/OT Referral Referral  Referral  Status: Hold For - Appointment,PreCert,Date of  Service,Physical Therapy  Requested for: [id number] Health Maintenance  3. UA With REFLEX; [Do Not Release]; Status:Complete;   Done: [id number] 01:54PM  Discussion/Summary 1. Prostate cancer: I had a detailed discussion with Fred [name] and his wife today.   The patient was counseled about the natural history of prostate cancer and the standard treatment options that are available for prostate cancer. It was explained to him how his age and life expectancy, clinical stage, Gleason score, and PSA affect his prognosis, the decision to proceed with additional staging studies, as well as how that information influences recommended treatment strategies. We discussed the roles for active surveillance, radiation therapy, surgical therapy, androgen deprivation, as well as ablative therapy options for the treatment of prostate cancer as appropriate to his individual cancer situation. We discussed the risks and benefits of these options with regard to their impact on cancer control and also in terms of potential adverse events, complications, and impact on quiality of life particularly related to urinary, bowel, and sexual function. The patient was encouraged to ask questions throughout the discussion today and all questions were answered to  his stated satisfaction. In addition, the patient was provided with and/or directed to appropriate resources and literature for further education about prostate cancer and treatment options.   We discussed surgical therapy for prostate cancer including the different available surgical approaches. We discussed, in detail, the risks and expectations of surgery with regard to cancer control, urinary control, and erectile  function as well as the expected postoperative recovery process. Additional risks of surgery including but not limited to bleeding, infection, hernia formation, nerve damage, lymphocele formation, bowel/rectal injury potentially necessitating colostomy, damage to the urinary tract resulting in urine leakage, urethral stricture, and the cardiopulmonary risks such as myocardial infarction, stroke, death, venothromboembolism, etc. were explained. The risk of open surgical conversion for robotic/laparoscopic prostatectomy was also discussed.     He does wish to proceed with surgical therapy which likely will be beneficial considering his very bothersome lower urinary tract symptoms. He will be scheduled for a left nerve sparing robotic-assisted laparoscopic radical prostatectomy and bilateral pelvic lymphadenectomy.  A total of 65 minutes were spent in the overall care of the patient today with 65 minutes in direct face to face consultation.     Cc: Dr. Kathie Rhodes  Dr. Maury Dus    Signatures Electronically signed by : [name], M.D.; Jul 19 2013  6:02PM EST

## 2013-08-04 ENCOUNTER — Encounter (HOSPITAL_COMMUNITY): Payer: Self-pay | Admitting: *Deleted

## 2013-08-04 ENCOUNTER — Encounter (HOSPITAL_COMMUNITY): Payer: Medicare Other | Admitting: Anesthesiology

## 2013-08-04 ENCOUNTER — Inpatient Hospital Stay (HOSPITAL_COMMUNITY)
Admission: RE | Admit: 2013-08-04 | Discharge: 2013-08-05 | DRG: 708 | Disposition: A | Discharge status: Home / Self Care | Payer: Medicare Other | Source: Ambulatory Visit | Attending: Urology | Admitting: Urology

## 2013-08-04 ENCOUNTER — Encounter (HOSPITAL_COMMUNITY)
Admission: RE | Disposition: A | Discharge status: Home / Self Care | Payer: Self-pay | Source: Ambulatory Visit | Attending: Urology

## 2013-08-04 ENCOUNTER — Inpatient Hospital Stay (HOSPITAL_COMMUNITY): Payer: Medicare Other | Admitting: Anesthesiology

## 2013-08-04 DIAGNOSIS — C61 Malignant neoplasm of prostate: Secondary | ICD-10-CM | POA: Diagnosis not present

## 2013-08-04 DIAGNOSIS — F329 Major depressive disorder, single episode, unspecified: Secondary | ICD-10-CM | POA: Diagnosis present

## 2013-08-04 DIAGNOSIS — G8929 Other chronic pain: Secondary | ICD-10-CM | POA: Diagnosis present

## 2013-08-04 DIAGNOSIS — K219 Gastro-esophageal reflux disease without esophagitis: Secondary | ICD-10-CM | POA: Diagnosis present

## 2013-08-04 DIAGNOSIS — E785 Hyperlipidemia, unspecified: Secondary | ICD-10-CM | POA: Diagnosis not present

## 2013-08-04 DIAGNOSIS — F3289 Other specified depressive episodes: Secondary | ICD-10-CM | POA: Diagnosis present

## 2013-08-04 DIAGNOSIS — Z800 Family history of malignant neoplasm of digestive organs: Secondary | ICD-10-CM

## 2013-08-04 DIAGNOSIS — Z87442 Personal history of urinary calculi: Secondary | ICD-10-CM

## 2013-08-04 DIAGNOSIS — Z87891 Personal history of nicotine dependence: Secondary | ICD-10-CM

## 2013-08-04 DIAGNOSIS — I10 Essential (primary) hypertension: Secondary | ICD-10-CM | POA: Diagnosis present

## 2013-08-04 DIAGNOSIS — H9190 Unspecified hearing loss, unspecified ear: Secondary | ICD-10-CM | POA: Diagnosis present

## 2013-08-04 DIAGNOSIS — Z8042 Family history of malignant neoplasm of prostate: Secondary | ICD-10-CM | POA: Diagnosis not present

## 2013-08-04 DIAGNOSIS — N529 Male erectile dysfunction, unspecified: Secondary | ICD-10-CM | POA: Diagnosis present

## 2013-08-04 DIAGNOSIS — M549 Dorsalgia, unspecified: Secondary | ICD-10-CM | POA: Diagnosis present

## 2013-08-04 DIAGNOSIS — H409 Unspecified glaucoma: Secondary | ICD-10-CM | POA: Diagnosis present

## 2013-08-04 HISTORY — PX: LYMPHADENECTOMY: SHX5960

## 2013-08-04 HISTORY — PX: ROBOT ASSISTED LAPAROSCOPIC RADICAL PROSTATECTOMY: SHX5141

## 2013-08-04 LAB — TYPE AND SCREEN
ABO/RH(D): A NEG
Antibody Screen: NEGATIVE

## 2013-08-04 LAB — HEMOGLOBIN AND HEMATOCRIT, BLOOD
HCT: 38.8 % — ABNORMAL LOW (ref 39.0–52.0)
HEMOGLOBIN: 13.2 g/dL (ref 13.0–17.0)

## 2013-08-04 SURGERY — ROBOTIC ASSISTED LAPAROSCOPIC RADICAL PROSTATECTOMY LEVEL 2
Anesthesia: General | Site: Prostate

## 2013-08-04 MED ORDER — SODIUM CHLORIDE 0.9 % IR SOLN
Status: DC | PRN
  Administered 2013-08-04: 300 mL via INTRAVESICAL

## 2013-08-04 MED ORDER — HYDROCODONE-ACETAMINOPHEN 5-325 MG PO TABS: ORAL_TABLET | ORAL | Status: DC

## 2013-08-04 MED ORDER — SODIUM CHLORIDE 0.9 % IJ SOLN
INTRAMUSCULAR | Status: AC
  Filled 2013-08-04: qty 10

## 2013-08-04 MED ORDER — NEOSTIGMINE METHYLSULFATE 1 MG/ML IJ SOLN
INTRAMUSCULAR | Status: DC | PRN
  Administered 2013-08-04: 4 mg via INTRAVENOUS

## 2013-08-04 MED ORDER — LACTATED RINGERS IV SOLN
INTRAVENOUS | Status: DC | PRN
  Administered 2013-08-04: 12:00:00

## 2013-08-04 MED ORDER — HYDROMORPHONE HCL PF 1 MG/ML IJ SOLN
INTRAMUSCULAR | Status: DC | PRN
  Administered 2013-08-04 (×4): 0.5 mg via INTRAVENOUS

## 2013-08-04 MED ORDER — CEFAZOLIN SODIUM-DEXTROSE 2-3 GM-% IV SOLR
2.0000 g | INTRAVENOUS | Status: AC
  Administered 2013-08-04: 2 g via INTRAVENOUS

## 2013-08-04 MED ORDER — CISATRACURIUM BESYLATE (PF) 10 MG/5ML IV SOLN
INTRAVENOUS | Status: DC | PRN
  Administered 2013-08-04 (×2): 4 mg via INTRAVENOUS
  Administered 2013-08-04: 10 mg via INTRAVENOUS

## 2013-08-04 MED ORDER — LIDOCAINE HCL (PF) 2 % IJ SOLN
INTRAMUSCULAR | Status: DC | PRN
  Administered 2013-08-04: 75 mg via INTRADERMAL

## 2013-08-04 MED ORDER — CIPROFLOXACIN HCL 500 MG PO TABS: 500.0000 mg | ORAL_TABLET | Freq: Two times a day (BID) | ORAL | Status: DC

## 2013-08-04 MED ORDER — KETOROLAC TROMETHAMINE 15 MG/ML IJ SOLN
INTRAMUSCULAR | Status: AC
  Administered 2013-08-04: 15 mg
  Filled 2013-08-04: qty 1

## 2013-08-04 MED ORDER — ATORVASTATIN CALCIUM 40 MG PO TABS
40.0000 mg | ORAL_TABLET | Freq: Every day | ORAL | Status: DC
  Administered 2013-08-04: 40 mg via ORAL
  Filled 2013-08-04 (×2): qty 1

## 2013-08-04 MED ORDER — FENTANYL CITRATE 0.05 MG/ML IJ SOLN
INTRAMUSCULAR | Status: DC | PRN
  Administered 2013-08-04 (×2): 50 ug via INTRAVENOUS
  Administered 2013-08-04: 100 ug via INTRAVENOUS
  Administered 2013-08-04: 50 ug via INTRAVENOUS

## 2013-08-04 MED ORDER — LACTATED RINGERS IV SOLN
INTRAVENOUS | Status: DC
Start: 2013-08-04 — End: 2013-08-04
  Administered 2013-08-04: 1000 mL via INTRAVENOUS
  Administered 2013-08-04: 13:00:00 via INTRAVENOUS

## 2013-08-04 MED ORDER — DIPHENHYDRAMINE HCL 12.5 MG/5ML PO ELIX: 12.5000 mg | ORAL_SOLUTION | Freq: Four times a day (QID) | ORAL | Status: DC | PRN

## 2013-08-04 MED ORDER — BUPIVACAINE-EPINEPHRINE 0.25% -1:200000 IJ SOLN
INTRAMUSCULAR | Status: DC | PRN
Start: 2013-08-04 — End: 2013-08-04
  Administered 2013-08-04: 30 mL

## 2013-08-04 MED ORDER — ONDANSETRON HCL 4 MG/2ML IJ SOLN
INTRAMUSCULAR | Status: DC | PRN
  Administered 2013-08-04: 4 mg via INTRAVENOUS

## 2013-08-04 MED ORDER — MORPHINE SULFATE 2 MG/ML IJ SOLN
2.0000 mg | INTRAMUSCULAR | Status: DC | PRN
  Administered 2013-08-05 (×2): 2 mg via INTRAVENOUS
  Filled 2013-08-04 (×2): qty 1

## 2013-08-04 MED ORDER — LIDOCAINE HCL (CARDIAC) 20 MG/ML IV SOLN
INTRAVENOUS | Status: AC
  Filled 2013-08-04: qty 5

## 2013-08-04 MED ORDER — SODIUM CHLORIDE 0.9 % IV BOLUS (SEPSIS)
1000.0000 mL | Freq: Once | INTRAVENOUS | Status: AC
  Administered 2013-08-04: 1000 mL via INTRAVENOUS

## 2013-08-04 MED ORDER — HYDROMORPHONE HCL PF 2 MG/ML IJ SOLN
INTRAMUSCULAR | Status: AC
  Filled 2013-08-04: qty 1

## 2013-08-04 MED ORDER — KETOROLAC TROMETHAMINE 15 MG/ML IJ SOLN
15.0000 mg | Freq: Four times a day (QID) | INTRAMUSCULAR | Status: DC
  Administered 2013-08-04 – 2013-08-05 (×4): 15 mg via INTRAVENOUS
  Filled 2013-08-04 (×5): qty 1

## 2013-08-04 MED ORDER — STERILE WATER FOR IRRIGATION IR SOLN
Status: DC | PRN
  Administered 2013-08-04 (×2): 1500 mL

## 2013-08-04 MED ORDER — HEPARIN SODIUM (PORCINE) 1000 UNIT/ML IJ SOLN
INTRAMUSCULAR | Status: AC
  Filled 2013-08-04: qty 1

## 2013-08-04 MED ORDER — CISATRACURIUM BESYLATE 20 MG/10ML IV SOLN
INTRAVENOUS | Status: AC
  Filled 2013-08-04: qty 10

## 2013-08-04 MED ORDER — HYDROMORPHONE HCL PF 1 MG/ML IJ SOLN
INTRAMUSCULAR | Status: AC
  Filled 2013-08-04: qty 1

## 2013-08-04 MED ORDER — DULOXETINE HCL 60 MG PO CPEP
60.0000 mg | ORAL_CAPSULE | Freq: Every day | ORAL | Status: DC
  Administered 2013-08-04: 60 mg via ORAL
  Filled 2013-08-04 (×2): qty 1

## 2013-08-04 MED ORDER — CEFAZOLIN SODIUM 1-5 GM-% IV SOLN
1.0000 g | Freq: Three times a day (TID) | INTRAVENOUS | Status: AC
  Administered 2013-08-04 – 2013-08-05 (×2): 1 g via INTRAVENOUS
  Filled 2013-08-04 (×2): qty 50

## 2013-08-04 MED ORDER — PROMETHAZINE HCL 25 MG/ML IJ SOLN: 6.2500 mg | INTRAMUSCULAR | Status: DC | PRN

## 2013-08-04 MED ORDER — CEFAZOLIN SODIUM-DEXTROSE 2-3 GM-% IV SOLR
INTRAVENOUS | Status: AC
  Filled 2013-08-04: qty 50

## 2013-08-04 MED ORDER — ACETAMINOPHEN 325 MG PO TABS: 650.0000 mg | ORAL_TABLET | ORAL | Status: DC | PRN

## 2013-08-04 MED ORDER — HYDROCODONE-ACETAMINOPHEN 5-325 MG PO TABS
1.0000 | ORAL_TABLET | Freq: Four times a day (QID) | ORAL | Status: DC | PRN
  Administered 2013-08-04 (×2): 2 via ORAL
  Filled 2013-08-04 (×2): qty 2

## 2013-08-04 MED ORDER — GLYCOPYRROLATE 0.2 MG/ML IJ SOLN
INTRAMUSCULAR | Status: AC
  Filled 2013-08-04: qty 3

## 2013-08-04 MED ORDER — MIDAZOLAM HCL 2 MG/2ML IJ SOLN
INTRAMUSCULAR | Status: AC
  Filled 2013-08-04: qty 2

## 2013-08-04 MED ORDER — KCL IN DEXTROSE-NACL 20-5-0.45 MEQ/L-%-% IV SOLN
INTRAVENOUS | Status: AC
  Filled 2013-08-04: qty 1000

## 2013-08-04 MED ORDER — GLYCOPYRROLATE 0.2 MG/ML IJ SOLN
INTRAMUSCULAR | Status: DC | PRN
  Administered 2013-08-04: 0.6 mg via INTRAVENOUS

## 2013-08-04 MED ORDER — DIPHENHYDRAMINE HCL 50 MG/ML IJ SOLN: 12.5000 mg | Freq: Four times a day (QID) | INTRAMUSCULAR | Status: DC | PRN

## 2013-08-04 MED ORDER — DOCUSATE SODIUM 100 MG PO CAPS
100.0000 mg | ORAL_CAPSULE | Freq: Two times a day (BID) | ORAL | Status: DC
  Administered 2013-08-04 – 2013-08-05 (×2): 100 mg via ORAL
  Filled 2013-08-04 (×3): qty 1

## 2013-08-04 MED ORDER — MIDAZOLAM HCL 5 MG/5ML IJ SOLN
INTRAMUSCULAR | Status: DC | PRN
  Administered 2013-08-04: 2 mg via INTRAVENOUS

## 2013-08-04 MED ORDER — BUPIVACAINE-EPINEPHRINE PF 0.25-1:200000 % IJ SOLN
INTRAMUSCULAR | Status: AC
  Filled 2013-08-04: qty 30

## 2013-08-04 MED ORDER — PROPOFOL 10 MG/ML IV BOLUS
INTRAVENOUS | Status: DC | PRN
  Administered 2013-08-04: 150 mg via INTRAVENOUS

## 2013-08-04 MED ORDER — EPHEDRINE SULFATE 50 MG/ML IJ SOLN
INTRAMUSCULAR | Status: AC
  Filled 2013-08-04: qty 1

## 2013-08-04 MED ORDER — FENTANYL CITRATE 0.05 MG/ML IJ SOLN
INTRAMUSCULAR | Status: AC
  Filled 2013-08-04: qty 5

## 2013-08-04 MED ORDER — ONDANSETRON HCL 4 MG/2ML IJ SOLN
INTRAMUSCULAR | Status: AC
  Filled 2013-08-04: qty 2

## 2013-08-04 MED ORDER — HYDROMORPHONE HCL PF 1 MG/ML IJ SOLN
0.2500 mg | INTRAMUSCULAR | Status: DC | PRN
  Administered 2013-08-04 (×2): 0.5 mg via INTRAVENOUS

## 2013-08-04 MED ORDER — SUCCINYLCHOLINE CHLORIDE 20 MG/ML IJ SOLN
INTRAMUSCULAR | Status: DC | PRN
  Administered 2013-08-04: 100 mg via INTRAVENOUS

## 2013-08-04 MED ORDER — KCL IN DEXTROSE-NACL 20-5-0.45 MEQ/L-%-% IV SOLN
INTRAVENOUS | Status: DC
  Administered 2013-08-04 – 2013-08-05 (×3): via INTRAVENOUS
  Filled 2013-08-04 (×4): qty 1000

## 2013-08-04 MED ORDER — PROPOFOL 10 MG/ML IV BOLUS
INTRAVENOUS | Status: AC
  Filled 2013-08-04: qty 20

## 2013-08-04 SURGICAL SUPPLY — 44 items
CABLE HIGH FREQUENCY MONO STRZ (ELECTRODE) ×4 IMPLANT
CATH FOLEY 2WAY SLVR 18FR 30CC (CATHETERS) ×4 IMPLANT
CATH ROBINSON RED A/P 16FR (CATHETERS) ×4 IMPLANT
CATH ROBINSON RED A/P 8FR (CATHETERS) ×4 IMPLANT
CATH TIEMANN FOLEY 18FR 5CC (CATHETERS) ×4 IMPLANT
CHLORAPREP W/TINT 26ML (MISCELLANEOUS) ×4 IMPLANT
CLIP LIGATING HEM O LOK PURPLE (MISCELLANEOUS) ×8 IMPLANT
CLOTH BEACON ORANGE TIMEOUT ST (SAFETY) ×4 IMPLANT
COVER SURGICAL LIGHT HANDLE (MISCELLANEOUS) ×4 IMPLANT
COVER TIP SHEARS 8 DVNC (MISCELLANEOUS) ×2 IMPLANT
COVER TIP SHEARS 8MM DA VINCI (MISCELLANEOUS) ×2
CUTTER ECHEON FLEX ENDO 45 340 (ENDOMECHANICALS) ×4 IMPLANT
DECANTER SPIKE VIAL GLASS SM (MISCELLANEOUS) ×4 IMPLANT
DERMABOND ADVANCED (GAUZE/BANDAGES/DRESSINGS) ×2
DERMABOND ADVANCED .7 DNX12 (GAUZE/BANDAGES/DRESSINGS) ×2 IMPLANT
DRAPE SURG IRRIG POUCH 19X23 (DRAPES) ×4 IMPLANT
DRSG TEGADERM 4X4.75 (GAUZE/BANDAGES/DRESSINGS) ×4 IMPLANT
DRSG TEGADERM 6X8 (GAUZE/BANDAGES/DRESSINGS) ×8 IMPLANT
ELECT REM PT RETURN 9FT ADLT (ELECTROSURGICAL) ×4
ELECTRODE REM PT RTRN 9FT ADLT (ELECTROSURGICAL) ×2 IMPLANT
GLOVE BIO SURGEON STRL SZ 6.5 (GLOVE) ×3 IMPLANT
GLOVE BIO SURGEONS STRL SZ 6.5 (GLOVE) ×1
GLOVE BIOGEL M STRL SZ7.5 (GLOVE) ×8 IMPLANT
GOWN STRL REUS W/TWL LRG LVL3 (GOWN DISPOSABLE) ×12 IMPLANT
GOWN STRL REUS W/TWL XL LVL3 (GOWN DISPOSABLE) ×8 IMPLANT
HOLDER FOLEY CATH W/STRAP (MISCELLANEOUS) ×4 IMPLANT
IV LACTATED RINGERS 1000ML (IV SOLUTION) ×4 IMPLANT
KIT ACCESSORY DA VINCI DISP (KITS) ×2
KIT ACCESSORY DVNC DISP (KITS) ×2 IMPLANT
MANIFOLD NEPTUNE II (INSTRUMENTS) ×4 IMPLANT
NDL SAFETY ECLIPSE 18X1.5 (NEEDLE) ×2 IMPLANT
NEEDLE HYPO 18GX1.5 SHARP (NEEDLE) ×2
PACK ROBOT UROLOGY CUSTOM (CUSTOM PROCEDURE TRAY) ×4 IMPLANT
RELOAD GREEN ECHELON 45 (STAPLE) ×4 IMPLANT
SET TUBE IRRIG SUCTION NO TIP (IRRIGATION / IRRIGATOR) ×4 IMPLANT
SOLUTION ELECTROLUBE (MISCELLANEOUS) ×4 IMPLANT
SPONGE GAUZE 4X4 12PLY (GAUZE/BANDAGES/DRESSINGS) ×4 IMPLANT
SUT ETHILON 3 0 PS 1 (SUTURE) ×4 IMPLANT
SUT MNCRL AB 4-0 PS2 18 (SUTURE) ×8 IMPLANT
SUT VICRYL 0 UR6 27IN ABS (SUTURE) ×8 IMPLANT
SYR 27GX1/2 1ML LL SAFETY (SYRINGE) ×4 IMPLANT
TOWEL OR 17X26 10 PK STRL BLUE (TOWEL DISPOSABLE) ×4 IMPLANT
TOWEL OR NON WOVEN STRL DISP B (DISPOSABLE) ×4 IMPLANT
WATER STERILE IRR 1500ML POUR (IV SOLUTION) ×8 IMPLANT

## 2013-08-04 NOTE — Anesthesia Preprocedure Evaluation (Addendum)
Anesthesia Evaluation  Patient identified by MRN, date of birth, ID band Patient awake    Reviewed: Allergy & Precautions, H&P , NPO status , Patient's Chart, lab work & pertinent test results  Airway Mallampati: II TM Distance: >3 FB Neck ROM: Full    Dental  (+) Edentulous Upper   Pulmonary former smoker,  breath sounds clear to auscultation  Pulmonary exam normal       Cardiovascular hypertension, Pt. on medications negative cardio ROS  Rhythm:Regular Rate:Normal     Neuro/Psych PSYCHIATRIC DISORDERS Depression Cervical stenosis of spinal canal. Hard of hearing.  Neuromuscular disease    GI/Hepatic negative GI ROS, Neg liver ROS,   Endo/Other  negative endocrine ROS  Renal/GU negative Renal ROS  negative genitourinary   Musculoskeletal negative musculoskeletal ROS (+)   Abdominal   Peds negative pediatric ROS (+)  Hematology negative hematology ROS (+)   Anesthesia Other Findings   Reproductive/Obstetrics negative OB ROS                         Anesthesia Physical Anesthesia Plan  ASA: II  Anesthesia Plan: General   Post-op Pain Management:    Induction: Intravenous  Airway Management Planned: Oral ETT  Additional Equipment:   Intra-op Plan:   Post-operative Plan: Extubation in OR  Informed Consent: I have reviewed the patients History and Physical, chart, labs and discussed the procedure including the risks, benefits and alternatives for the proposed anesthesia with the patient or authorized representative who has indicated his/her understanding and acceptance.   Dental advisory given  Plan Discussed with: CRNA  Anesthesia Plan Comments: (Glidescope available, and head/neck in neutral position for intubation.)       Anesthesia Quick Evaluation

## 2013-08-04 NOTE — Op Note (Signed)
Preoperative diagnosis: Clinically localized adenocarcinoma of the prostate (clinical stage T2a Nx Mx)  Postoperative diagnosis: Clinically localized adenocarcinoma of the prostate (clinical stage T2a Nx Mx)  Procedure:  1. Robotic assisted laparoscopic radical prostatectomy (left nerve sparing) 2. Bilateral robotic assisted laparoscopic pelvic lymphadenectomy  Surgeon: [name]. M.D.  Assistant(s): Leta Baptist, PA-C  Anesthesia: General  Complications: None  EBL: 50 mL  IVF:  1500 mL crystalloid  Specimens: 1. Prostate and seminal vesicles 2. Right pelvic lymph nodes 3. Left pelvic lymph nodes  Disposition of specimens: Pathology  Drains: 1. 20 Fr coude catheter 2. # 19 Blake pelvic drain  Indication: Fred [name] is a 63 y.o. patient with clinically localized prostate cancer.  After a thorough review of the management options for treatment of prostate cancer, he elected to proceed with surgical therapy and the above procedure(s).  We have discussed the potential benefits and risks of the procedure, side effects of the proposed treatment, the likelihood of the patient achieving the goals of the procedure, and any potential problems that might occur during the procedure or recuperation. Informed consent has been obtained.  Description of procedure:  The patient was taken to the operating room and a general anesthetic was administered. He was given preoperative antibiotics, placed in the dorsal lithotomy position, and prepped and draped in the usual sterile fashion. Next a preoperative timeout was performed. A urethral catheter was placed into the bladder and a site was selected near the umbilicus for placement of the camera port. This was placed using a standard open Hassan technique which allowed entry into the peritoneal cavity under direct vision and without difficulty. A 12 mm port was placed and a pneumoperitoneum established. The camera was then used to  inspect the abdomen and there was no evidence of any intra-abdominal injuries or other abnormalities. The remaining abdominal ports were then placed. 8 mm robotic ports were placed in the right lower quadrant, left lower quadrant, and far left lateral abdominal wall. A 5 mm port was placed in the right upper quadrant and a 12 mm port was placed in the right lateral abdominal wall for laparoscopic assistance. All ports were placed under direct vision without difficulty. The surgical cart was then docked.   Utilizing the cautery scissors, the bladder was reflected posteriorly allowing entry into the space of Retzius and identification of the endopelvic fascia and prostate. The periprostatic fat was then removed from the prostate allowing full exposure of the endopelvic fascia. The endopelvic fascia was then incised from the apex back to the base of the prostate bilaterally and the underlying levator muscle fibers were swept laterally off the prostate thereby isolating the dorsal venous complex. The dorsal vein was then stapled and divided with a 45 mm Flex Echelon stapler. Attention then turned to the bladder neck which was divided anteriorly thereby allowing entry into the bladder and exposure of the urethral catheter. The catheter balloon was deflated and the catheter was brought into the operative field and used to retract the prostate anteriorly. The posterior bladder neck was then examined and was divided allowing further dissection between the bladder and prostate posteriorly until the vasa deferentia and seminal vessels were identified. The vasa deferentia were isolated, divided, and lifted anteriorly. The seminal vesicles were dissected down to their tips with care to control the seminal vascular arterial blood supply. These structures were then lifted anteriorly and the space between Denonvillier's fascia and the anterior rectum was developed with a combination of sharp  and blunt dissection. This isolated  the vascular pedicles of the prostate.  The lateral prostatic fascia on the left side of the prostate was then sharply incised allowing release of the neurovascular bundle. The vascular pedicle of the prostate on the left side was then ligated with Weck clips between the prostate and neurovascular bundle and divided with sharp cold scissor dissection resulting in neurovascular bundle preservation. On the right side, a wide non nerve sparing dissection was performed with Weck clips used to ligate the vascular pedicle of the prostate. The neurovascular bundle on the left side was then separated off the apex of the prostate and urethra.   The urethra was then sharply transected allowing the prostate specimen to be disarticulated. The pelvis was copiously irrigated and hemostasis was ensured. There was no evidence for rectal injury.  Attention then turned to the right pelvic sidewall. The fibrofatty tissue between the external iliac vein, confluence of the iliac vessels, hypogastric artery, and Cooper's ligament was dissected free from the pelvic sidewall with care to preserve the obturator nerve. Weck clips were used for lymphostasis and hemostasis. An identical procedure was performed on the contralateral side and the lymphatic packets were removed for permanent pathologic analysis.  Attention then turned to the urethral anastomosis. A 2-0 Vicryl slip knot was placed between Denonvillier's fascia, the posterior bladder neck, and the posterior urethra to reapproximate these structures. A double-armed 3-0 Monocryl suture was then used to perform a 360 running tension-free anastomosis between the bladder neck and urethra. A new urethral catheter was then placed into the bladder and irrigated. There were no blood clots within the bladder and the anastomosis appeared to be watertight. A #19 Blake drain was then brought through the left lateral 8 mm port site and positioned appropriately within the pelvis. It was  secured to the skin with a nylon suture. The surgical cart was then undocked. The right lateral 12 mm port site was closed at the fascial level with a 0 Vicryl suture placed laparoscopically. All remaining ports were then removed under direct vision. The prostate specimen was removed intact within the Endopouch retrieval bag via the periumbilical camera port site. This fascial opening was closed with two running 0 Vicryl sutures. 0.25% Marcaine was then injected into all port sites and all incisions were reapproximated at the skin level with 4-0 Monocryl subcuticular sutures and Dermabond. The patient appeared to tolerate the procedure well and without complications. The patient was able to be extubated and transferred to the recovery unit in satisfactory condition.   [name] MD

## 2013-08-04 NOTE — Transfer of Care (Signed)
Immediate Anesthesia Transfer of Care Note  Patient: Fred [name]  Procedure(s) Performed: Procedure(s): ROBOTIC ASSISTED LAPAROSCOPIC RADICAL PROSTATECTOMY LEVEL 2 (N/A) LYMPHADENECTOMY (Bilateral)  Patient Location: PACU  Anesthesia Type:General  Level of Consciousness: awake, sedated and responds to stimulation  Airway & Oxygen Therapy: Patient Spontanous Breathing and Patient connected to face mask oxygen  Post-op Assessment: Report given to PACU RN and Post -op Vital signs reviewed and stable  Post vital signs: Reviewed and stable  Complications: No apparent anesthesia complications

## 2013-08-04 NOTE — Discharge Instructions (Signed)
1. Activity:  You are encouraged to ambulate frequently (about every hour during waking hours) to help prevent blood clots from forming in your legs or lungs.  However, you should not engage in any heavy lifting (> 10-15 lbs), strenuous activity, or straining. °2. Diet: You should continue a clear liquid diet until passing gas from below.  Once this occurs, you may advance your diet to a soft diet that would be easy to digest (i.e soups, scrambled eggs, mashed potatoes, etc.) for 24 hours just as you would if getting over a bad stomach flu.  If tolerating this diet well for 24 hours, you may then begin eating regular food.  It will be normal to have some amount of bloating, nausea, and abdominal discomfort intermittently. °3. Prescriptions:  You will be provided a prescription for pain medication to take as needed.  If your pain is not severe enough to require the prescription pain medication, you may take extra strength Tylenol instead.  You should also take an over the counter stool softener (Colace 100 mg twice daily) to avoid straining with bowel movements as the pain medication may constipate you. Finally, you will also be provided a prescription for an antibiotic to begin the day prior to your return visit in the office for catheter removal. °4. Catheter care: You will be taught how to take care of the catheter by the nursing staff prior to discharge from the hospital.  You may use both a leg bag and the larger bedside bag but it is recommended to at least use the bigger bedside bag at nighttime as the leg bag is small and will fill up overnight and also does not drain as well when lying flat. You may periodically feel a strong urge to void with the catheter in place.  This is a bladder spasm and most often can occur when having a bowel movement or when you are moving around. It is typically self-limited and usually will stop after a few minutes.  You may use some Vaseline or Neosporin around the tip of the  catheter to reduce friction at the tip of the penis. °5. Incisions: You may remove your dressing bandages the 2nd day after surgery.  You most likely will have a few small staples in each of the incisions and once the bandages are removed, the incisions may stay open to air.  You may start showering (not soaking or bathing in water) 48 hours after surgery and the incisions simply need to be patted dry after the shower.  No additional care is needed. °6. What to call us about: You should call the office ([phone]) if you develop fever > 101, persistent vomiting, or the catheter stops draining. Also, feel free to call with any other questions you may have and remember the handout that was provided to you as a reference preoperatively which answers many of the common questions that arise after surgery. ° °You may resume aspirin, vitamins, and supplements 7 days after surgery. °

## 2013-08-04 NOTE — Interval H&P Note (Signed)
History and Physical Interval Note:  08/04/2013 10:09 AM  Fred [name]  has presented today for surgery, with the diagnosis of PROSTATE CANCER  The various methods of treatment have been discussed with the patient and family. After consideration of risks, benefits and other options for treatment, the patient has consented to  Procedure(s): ROBOTIC ASSISTED LAPAROSCOPIC RADICAL PROSTATECTOMY LEVEL 2 (N/A) LYMPHADENECTOMY (Bilateral) as a surgical intervention .  The patient's history has been reviewed, patient examined, no change in status, stable for surgery.  I have reviewed the patient's chart and labs.  Questions were answered to the patient's satisfaction.     [name],LES

## 2013-08-04 NOTE — Care Management Note (Signed)
    Page 1 of 1   08/04/2013     3:15:55 PM   CARE MANAGEMENT NOTE 08/04/2013  Patient:  Fred [name], Fred [name]   Account Number:  [id number]  Date Initiated:  08/04/2013  Documentation initiated by:  Dessa Phi  Subjective/Objective Assessment:   63 Y/O M ADMITTED W/PROSTATE CA.     Action/Plan:   FROM HOME.   Anticipated DC Date:  08/05/2013   Anticipated DC Plan:  [place]  CM consult      Choice offered to / List presented to:             Status of service:  In process, will continue to follow Medicare Important Message given?   (If response is "NO", the following Medicare IM given date fields will be blank) Date Medicare IM given:   Date Additional Medicare IM given:    Discharge Disposition:    Per UR Regulation:  Reviewed for med. necessity/level of care/duration of stay  If discussed at [place] of Stay Meetings, dates discussed:    Comments:  08/04/13 Fred Stefanski RN,BSN NCM [phone] S/P LAP RADICAL PROSTATECTOMY.NO ANTICIPATED D/C NEEDS.

## 2013-08-04 NOTE — Progress Notes (Signed)
Utilization review completed.  

## 2013-08-04 NOTE — Anesthesia Postprocedure Evaluation (Signed)
  Anesthesia Post-op Note  Patient: Fred [name]  Procedure(s) Performed: Procedure(s) (LRB): ROBOTIC ASSISTED LAPAROSCOPIC RADICAL PROSTATECTOMY LEVEL 2 (N/A) LYMPHADENECTOMY (Bilateral)  Patient Location: PACU  Anesthesia Type: General  Level of Consciousness: awake and alert   Airway and Oxygen Therapy: Patient Spontanous Breathing  Post-op Pain: mild  Post-op Assessment: Post-op Vital signs reviewed, Patient's Cardiovascular Status Stable, Respiratory Function Stable, Patent Airway and No signs of Nausea or vomiting  Last Vitals:  Filed Vitals:   08/04/13 1430  BP: 123/77  Pulse:   Temp: 36.7 C  Resp:     Post-op Vital Signs: stable   Complications: No apparent anesthesia complications

## 2013-08-05 LAB — HEMOGLOBIN AND HEMATOCRIT, BLOOD
HCT: 34.4 % — ABNORMAL LOW (ref 39.0–52.0)
HEMOGLOBIN: 11.8 g/dL — AB (ref 13.0–17.0)

## 2013-08-05 MED ORDER — ZOLPIDEM TARTRATE 5 MG PO TABS
5.0000 mg | ORAL_TABLET | Freq: Every evening | ORAL | Status: DC | PRN
  Administered 2013-08-05: 5 mg via ORAL
  Filled 2013-08-05: qty 1

## 2013-08-05 MED ORDER — BISACODYL 10 MG RE SUPP
10.0000 mg | Freq: Once | RECTAL | Status: AC
  Administered 2013-08-05: 10 mg via RECTAL
  Filled 2013-08-05: qty 1

## 2013-08-05 MED ORDER — HYDROCODONE-ACETAMINOPHEN 5-325 MG PO TABS
1.0000 | ORAL_TABLET | Freq: Four times a day (QID) | ORAL | Status: DC | PRN
  Administered 2013-08-05: 2 via ORAL
  Filled 2013-08-05: qty 2

## 2013-08-05 MED ORDER — HYDROCODONE-ACETAMINOPHEN 5-325 MG PO TABS: ORAL_TABLET | ORAL | Status: DC

## 2013-08-05 NOTE — Progress Notes (Signed)
Patient ID: Fred [name], male   DOB: [birth year], 63 y.o.   MRN: [id number]  1 Day Post-Op Subjective: The patient is doing well.  No nausea or vomiting. Pain is adequately controlled.  Objective: Vital signs in last 24 hours: Temp:  [97.7 F (36.5 C)-98.6 F (37 C)] 97.7 F (36.5 C) (03/20 0132) Pulse Rate:  [56-79] 56 (03/20 0132) Resp:  [8-18] 16 (03/20 0132) BP: (100-156)/(62-100) 101/70 mmHg (03/20 0132) SpO2:  [95 %-100 %] 100 % (03/20 0132) Weight:  [81.647 kg (180 lb)] 81.647 kg (180 lb) (03/19 1500)  Intake/Output from previous day: 03/19 0701 - 03/20 0700 In: 5292.5 [P.O.:360; I.V.:3932.5; IV Piggyback:1000] Out: 2570 [Urine:2390; Drains:130; Blood:50] Intake/Output this shift: Total I/O In: 2692.5 [P.O.:360; I.V.:2332.5] Out: 2320 [Urine:2250; Drains:70]  Physical Exam:  General: Alert and oriented. CV: RRR Lungs: Clear bilaterally. GI: Soft, Nondistended. Incisions: Clean, dry, and intact Urine: Clear Extremities: Nontender, no erythema, no edema.  Lab Results:  Recent Labs  08/04/13 1423 08/05/13 0344  HGB 13.2 11.8*  HCT 38.8* 34.4*      Assessment/Plan: POD# 1 s/p robotic prostatectomy.  1) SL IVF 2) Ambulate, Incentive spirometry 3) Transition to oral pain medication 4) Dulcolax suppository 5) D/C pelvic drain 6) Plan for likely discharge later today   Fred [name]. MD   LOS: 1 day   Fred [name],LES 08/05/2013, 6:58 AM

## 2013-08-05 NOTE — Discharge Summary (Signed)
  Date of admission: 08/04/2013  Date of discharge: 08/05/2013  Admission diagnosis: Prostate Cancer  Discharge diagnosis: Prostate Cancer  History and Physical: For full details, please see admission history and physical. Briefly, Fred [name] is a 63 y.o. gentleman with localized prostate cancer.  After discussing management/treatment options, he elected to proceed with surgical treatment.  Hospital Course: Fred [name] was taken to the operating room on 08/04/2013 and underwent a robotic assisted laparoscopic radical prostatectomy. He tolerated this procedure well and without complications. Postoperatively, he was able to be transferred to a regular hospital room following recovery from anesthesia.  He was able to begin ambulating the night of surgery. He remained hemodynamically stable overnight.  He had excellent urine output with appropriately minimal output from his pelvic drain and his pelvic drain was removed on POD #1.  He was transitioned to oral pain medication, tolerated a clear liquid diet, and had met all discharge criteria and was able to be discharged home later on POD#1.  Laboratory values:  Recent Labs  08/04/13 1423 08/05/13 0344  HGB 13.2 11.8*  HCT 38.8* 34.4*    Disposition: Home  Discharge instruction: He was instructed to be ambulatory but to refrain from heavy lifting, strenuous activity, or driving. He was instructed on urethral catheter care.  Discharge medications:     Medication List         atorvastatin 40 MG tablet  Commonly known as:  LIPITOR  Take 40 mg by mouth at bedtime.     ciprofloxacin 500 MG tablet  Commonly known as:  CIPRO  Take 1 tablet (500 mg total) by mouth 2 (two) times daily. Start day prior to office visit for foley removal     DULoxetine 60 MG capsule  Commonly known as:  CYMBALTA  Take 60 mg by mouth at bedtime.     HYDROcodone-acetaminophen 5-325 MG per tablet  Commonly known as:  NORCO/VICODIN  Take 1 tablet every  4-6 hrs as needed for pain; may take 2 tablets every 6 hours if needed.     lisinopril-hydrochlorothiazide 20-12.5 MG per tablet  Commonly known as:  PRINZIDE,ZESTORETIC  Take 2 tablets by mouth every morning.        Followup: He will followup in 1 week for catheter removal and to discuss his surgical pathology results.

## 2013-08-05 NOTE — Progress Notes (Signed)
Patient ID: Fred [name], male   DOB: 1951/04/13, 63 y.o.   MRN: [id number] Post-op note  Subjective: The patient is doing well.  No complaints.  Objective: Vital signs in last 24 hours: Temp:  [97.7 F (36.5 C)-98.6 F (37 C)] 97.7 F (36.5 C) (03/20 0132) Pulse Rate:  [56-79] 56 (03/20 0132) Resp:  [8-18] 16 (03/20 0132) BP: (100-156)/(62-100) 101/70 mmHg (03/20 0132) SpO2:  [95 %-100 %] 100 % (03/20 0132) Weight:  [81.647 kg (180 lb)] 81.647 kg (180 lb) (03/19 1500)  Intake/Output from previous day: 03/19 0701 - 03/20 0700 In: 4122.5 [P.O.:240; I.V.:2882.5; IV Piggyback:1000] Out: 1500 [Urine:1340; Drains:110; Blood:50] Intake/Output this shift: Total I/O In: 1522.5 [P.O.:240; I.V.:1282.5] Out: 1250 [Urine:1200; Drains:50]  Physical Exam:  General: Alert and oriented. Abdomen: Soft, Nondistended. Incisions: Clean and dry. GU: Urine clearing.  Lab Results:  Recent Labs  08/04/13 1423 08/05/13 0344  HGB 13.2 11.8*  HCT 38.8* 34.4*    Assessment/Plan: POD#0   1) Continue to monitor   [name]. MD   LOS: 1 day   Enriqueta Augusta,LES 08/05/2013, 5:06 AM

## 2013-08-08 ENCOUNTER — Encounter (HOSPITAL_COMMUNITY): Payer: Self-pay | Admitting: Urology

## 2013-08-22 DIAGNOSIS — R279 Unspecified lack of coordination: Secondary | ICD-10-CM | POA: Diagnosis not present

## 2013-08-22 DIAGNOSIS — M6281 Muscle weakness (generalized): Secondary | ICD-10-CM | POA: Diagnosis not present

## 2013-08-22 DIAGNOSIS — N393 Stress incontinence (female) (male): Secondary | ICD-10-CM | POA: Diagnosis not present

## 2013-09-22 DIAGNOSIS — I10 Essential (primary) hypertension: Secondary | ICD-10-CM | POA: Diagnosis not present

## 2013-09-22 DIAGNOSIS — F3289 Other specified depressive episodes: Secondary | ICD-10-CM | POA: Diagnosis not present

## 2013-09-22 DIAGNOSIS — N529 Male erectile dysfunction, unspecified: Secondary | ICD-10-CM | POA: Diagnosis not present

## 2013-09-22 DIAGNOSIS — F329 Major depressive disorder, single episode, unspecified: Secondary | ICD-10-CM | POA: Diagnosis not present

## 2013-09-22 DIAGNOSIS — N183 Chronic kidney disease, stage 3 unspecified: Secondary | ICD-10-CM | POA: Diagnosis not present

## 2013-09-22 DIAGNOSIS — E785 Hyperlipidemia, unspecified: Secondary | ICD-10-CM | POA: Diagnosis not present

## 2013-09-22 DIAGNOSIS — M549 Dorsalgia, unspecified: Secondary | ICD-10-CM | POA: Diagnosis not present

## 2013-09-27 DIAGNOSIS — N393 Stress incontinence (female) (male): Secondary | ICD-10-CM | POA: Diagnosis not present

## 2013-09-27 DIAGNOSIS — C61 Malignant neoplasm of prostate: Secondary | ICD-10-CM | POA: Diagnosis not present

## 2013-12-06 ENCOUNTER — Encounter: Payer: Self-pay | Admitting: Physical Medicine & Rehabilitation

## 2013-12-06 ENCOUNTER — Encounter: Payer: Medicare Other | Attending: Physical Medicine & Rehabilitation

## 2013-12-06 ENCOUNTER — Ambulatory Visit (HOSPITAL_BASED_OUTPATIENT_CLINIC_OR_DEPARTMENT_OTHER): Payer: Medicare Other | Admitting: Physical Medicine & Rehabilitation

## 2013-12-06 VITALS — BP 125/84 | HR 64 | Resp 14 | Wt 178.2 lb

## 2013-12-06 DIAGNOSIS — M545 Low back pain, unspecified: Secondary | ICD-10-CM

## 2013-12-06 DIAGNOSIS — M542 Cervicalgia: Secondary | ICD-10-CM | POA: Insufficient documentation

## 2013-12-06 DIAGNOSIS — Z79899 Other long term (current) drug therapy: Secondary | ICD-10-CM

## 2013-12-06 DIAGNOSIS — S32009A Unspecified fracture of unspecified lumbar vertebra, initial encounter for closed fracture: Secondary | ICD-10-CM | POA: Insufficient documentation

## 2013-12-06 DIAGNOSIS — E785 Hyperlipidemia, unspecified: Secondary | ICD-10-CM | POA: Diagnosis not present

## 2013-12-06 DIAGNOSIS — G8929 Other chronic pain: Secondary | ICD-10-CM | POA: Diagnosis not present

## 2013-12-06 DIAGNOSIS — C61 Malignant neoplasm of prostate: Secondary | ICD-10-CM | POA: Diagnosis not present

## 2013-12-06 DIAGNOSIS — M961 Postlaminectomy syndrome, not elsewhere classified: Secondary | ICD-10-CM | POA: Diagnosis not present

## 2013-12-06 DIAGNOSIS — G709 Myoneural disorder, unspecified: Secondary | ICD-10-CM | POA: Diagnosis not present

## 2013-12-06 DIAGNOSIS — Z5181 Encounter for therapeutic drug level monitoring: Secondary | ICD-10-CM | POA: Diagnosis not present

## 2013-12-06 DIAGNOSIS — I10 Essential (primary) hypertension: Secondary | ICD-10-CM | POA: Diagnosis not present

## 2013-12-06 DIAGNOSIS — W19XXXA Unspecified fall, initial encounter: Secondary | ICD-10-CM | POA: Diagnosis not present

## 2013-12-06 DIAGNOSIS — M549 Dorsalgia, unspecified: Secondary | ICD-10-CM | POA: Insufficient documentation

## 2013-12-06 MED ORDER — HYDROCODONE-ACETAMINOPHEN 5-325 MG PO TABS: ORAL_TABLET | ORAL | Status: DC

## 2013-12-06 NOTE — Progress Notes (Signed)
Subjective:    Patient ID: Fred [name], male    DOB: February 11, 1951, 63 y.o.   MRN: [id number]  HPI  Diagnosed with prostate cancer and radical prostatectomy 08/04/2013, post op pain meds were similar to what have been his chronic meds for neck and back pain Has another urology appt, was told that he had some spread into lymph nodes possibly  Pain Inventory Average Pain 5 Pain Right Now 4 My pain is aching  In the last 24 hours, has pain interfered with the following? General activity 7 Relation with others 3 Enjoyment of life 3 What TIME of day is your pain at its worst? daytime and evening Sleep (in general) Fair  Pain is worse with: walking, bending and some activites Pain improves with: rest and medication Relief from Meds: no answer  Mobility walk without assistance ability to climb steps?  yes do you drive?  yes  Function disabled: date disabled . I need assistance with the following:  household duties  Neuro/Psych bladder control problems numbness tingling trouble walking confusion depression anxiety  Prior Studies Any changes since last visit?  yes  Radical prostatectomy  Physicians involved in your care Any changes since last visit?  no   Family History  Problem Relation Age of Onset  . Stroke Mother   . Heart disease Father   . Stroke Father     heat stroke  . Kidney disease Brother    History   Social History  . Marital Status: Married    Spouse Name: N/A    Number of Children: N/A  . Years of Education: N/A   Social History Main Topics  . Smoking status: Former Smoker -- 1.00 packs/day for 30 years    Types: Cigarettes    Quit date: 05/25/2001  . Smokeless tobacco: Former Systems developer    Quit date: 09/22/2001  . Alcohol Use: No  . Drug Use: No  . Sexual Activity: None   Other Topics Concern  . None   Social History Narrative  . None   Past Surgical History  Procedure Laterality Date  . Spine surgery      cervical disc removed  and stabalized with bone- good mobility  . Brain surgery      inserted rod to connect to BaHa(Hearing device)  . Eye surgery      lasik  . Robot assisted laparoscopic radical prostatectomy N/A 08/04/2013    Procedure: ROBOTIC ASSISTED LAPAROSCOPIC RADICAL PROSTATECTOMY LEVEL 2;  Surgeon: Dutch Gray, MD;  Location: [facility];  Service: Urology;  Laterality: N/A;  . Lymphadenectomy Bilateral 08/04/2013    Procedure: LYMPHADENECTOMY;  Surgeon: Dutch Gray, MD;  Location: [facility];  Service: Urology;  Laterality: Bilateral;   Past Medical History  Diagnosis Date  . Neuromuscular disorder   . Hypertension   . Hyperlipidemia   . History of kidney stones   . Cancer     prostate  . Hearing loss   . HOH (hard of hearing)   . Depression    BP 125/84  Pulse 64  Resp 14  Wt 178 lb 3.2 oz (80.831 kg)  SpO2 99%  Opioid Risk Score:   Fall Risk Score: Moderate Fall Risk (6-13 points) (educated and given handout for fall prevention in the home) Review of Systems  Genitourinary:       Bladder control problems  Musculoskeletal: Positive for back pain, gait problem and neck pain.  Neurological: Positive for numbness.       Tingling  Psychiatric/Behavioral:  Positive for confusion and dysphoric mood. The patient is nervous/anxious.   All other systems reviewed and are negative.      Objective:   Physical Exam  Nursing note and vitals reviewed. Constitutional: He is oriented to person, place, and time. He appears well-developed and well-nourished.  HENT:  Head: Normocephalic and atraumatic.  Eyes: Conjunctivae are normal. Pupils are equal, round, and reactive to light.  Musculoskeletal:       Lumbar back: He exhibits decreased range of motion and tenderness. He exhibits no edema and no spasm.  Tenderness in trapezius muscle as well as lumbar paraspinals around L4 as well as cervical paraspinals C5-C7    Neurological: He is alert and oriented to person, place, and time. He has normal strength.  No sensory deficit.  Psychiatric: He has a normal mood and affect.          Assessment & Plan:  1. L2 compression fracture, cannot definitely state the onset however appears to have occurred in November when he stepped in a hole and had immediate onset of pain. No neurologic symptoms. No new findings or symptoms in the low back area since diagnosis of prostate CA do not think further imaging as needed at the current time. 2. 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 We'll change to 3 times a day. Repeat UDS Takes as needed tylenol also discussed Naproxen use , side effects  Return to clinic 4 weeks with nurse practitioner

## 2013-12-06 NOTE — Patient Instructions (Signed)
Will do pill count next visit and if you're only taking 2 tablets per day on average we can reduce your hydrocodone prescription to 60.

## 2014-01-03 ENCOUNTER — Ambulatory Visit: Payer: PRIVATE HEALTH INSURANCE | Admitting: Registered Nurse

## 2014-01-04 ENCOUNTER — Ambulatory Visit: Payer: PRIVATE HEALTH INSURANCE | Admitting: Registered Nurse

## 2014-01-09 ENCOUNTER — Ambulatory Visit: Payer: PRIVATE HEALTH INSURANCE | Admitting: Registered Nurse

## 2014-01-11 ENCOUNTER — Ambulatory Visit: Payer: PRIVATE HEALTH INSURANCE | Admitting: Registered Nurse

## 2014-01-12 ENCOUNTER — Ambulatory Visit: Payer: PRIVATE HEALTH INSURANCE | Admitting: Registered Nurse

## 2014-01-17 ENCOUNTER — Encounter: Payer: Self-pay | Admitting: Registered Nurse

## 2014-01-17 ENCOUNTER — Encounter: Payer: Medicare Other | Attending: Physical Medicine & Rehabilitation | Admitting: Registered Nurse

## 2014-01-17 VITALS — BP 168/99 | HR 62 | Resp 14 | Ht 68.0 in | Wt 183.0 lb

## 2014-01-17 DIAGNOSIS — C61 Malignant neoplasm of prostate: Secondary | ICD-10-CM | POA: Insufficient documentation

## 2014-01-17 DIAGNOSIS — S32009A Unspecified fracture of unspecified lumbar vertebra, initial encounter for closed fracture: Secondary | ICD-10-CM | POA: Insufficient documentation

## 2014-01-17 DIAGNOSIS — Z5181 Encounter for therapeutic drug level monitoring: Secondary | ICD-10-CM

## 2014-01-17 DIAGNOSIS — G709 Myoneural disorder, unspecified: Secondary | ICD-10-CM | POA: Diagnosis not present

## 2014-01-17 DIAGNOSIS — M542 Cervicalgia: Secondary | ICD-10-CM | POA: Diagnosis not present

## 2014-01-17 DIAGNOSIS — Z79899 Other long term (current) drug therapy: Secondary | ICD-10-CM | POA: Diagnosis not present

## 2014-01-17 DIAGNOSIS — M961 Postlaminectomy syndrome, not elsewhere classified: Secondary | ICD-10-CM

## 2014-01-17 DIAGNOSIS — E785 Hyperlipidemia, unspecified: Secondary | ICD-10-CM | POA: Diagnosis not present

## 2014-01-17 DIAGNOSIS — G8929 Other chronic pain: Secondary | ICD-10-CM | POA: Diagnosis not present

## 2014-01-17 DIAGNOSIS — M545 Low back pain, unspecified: Secondary | ICD-10-CM

## 2014-01-17 DIAGNOSIS — M549 Dorsalgia, unspecified: Secondary | ICD-10-CM | POA: Insufficient documentation

## 2014-01-17 DIAGNOSIS — I10 Essential (primary) hypertension: Secondary | ICD-10-CM | POA: Insufficient documentation

## 2014-01-17 MED ORDER — HYDROCODONE-ACETAMINOPHEN 5-325 MG PO TABS: ORAL_TABLET | ORAL | Status: DC

## 2014-01-17 NOTE — Progress Notes (Signed)
Subjective:    Patient ID: VLADIMIR LENHOFF, male    DOB: 1950-11-20, 63 y.o.   MRN: [id number]  HPI: Mr. RUI WORDELL is a 63 year old male who returns for follow up for chronic pain and medication refill. He says his pain is located in his neck and lower back. He rates his pain 5. His current exercise regime is performing stretching exercises and walking short distances.  He asked if he had to come to the office monthly due to financial hardships. I explained with Hydrocodone he had to come monthly, also explain we can switch him to Tylenol #3 and he could come to the office every three months. He verbalizes understanding. He will think about it and let me know if he's willing to switch.  Pain Inventory Average Pain 6 Pain Right Now 5 My pain is intermittent and sharp  In the last 24 hours, has pain interfered with the following? General activity 7 Relation with others 3 Enjoyment of life 3 What TIME of day is your pain at its worst? evening, night Sleep (in general) Poor  Pain is worse with: walking, bending and sitting Pain improves with: rest and medication Relief from Meds: 6  Mobility walk without assistance ability to climb steps?  yes transfers alone  Function not employed: date last employed 2001 disabled: date disabled na I need assistance with the following:  household duties  Neuro/Psych bladder control problems numbness tingling spasms confusion depression loss of taste or smell  Prior Studies Any changes since last visit?  no  Physicians involved in your care Any changes since last visit?  no   Family History  Problem Relation Age of Onset  . Stroke Mother   . Heart disease Father   . Stroke Father     heat stroke  . Kidney disease Brother    History   Social History  . Marital Status: Married    Spouse Name: N/A    Number of Children: N/A  . Years of Education: N/A   Social History Main Topics  . Smoking status: Former Smoker --  1.00 packs/day for 30 years    Types: Cigarettes    Quit date: 05/25/2001  . Smokeless tobacco: Former Systems developer    Quit date: 09/22/2001  . Alcohol Use: No  . Drug Use: No  . Sexual Activity: None   Other Topics Concern  . None   Social History Narrative  . None   Past Surgical History  Procedure Laterality Date  . Spine surgery      cervical disc removed and stabalized with bone- good mobility  . Brain surgery      inserted rod to connect to BaHa(Hearing device)  . Eye surgery      lasik  . Robot assisted laparoscopic radical prostatectomy N/A 08/04/2013    Procedure: ROBOTIC ASSISTED LAPAROSCOPIC RADICAL PROSTATECTOMY LEVEL 2;  Surgeon: Dutch Gray, MD;  Location: [facility];  Service: Urology;  Laterality: N/A;  . Lymphadenectomy Bilateral 08/04/2013    Procedure: LYMPHADENECTOMY;  Surgeon: Dutch Gray, MD;  Location: [facility];  Service: Urology;  Laterality: Bilateral;   Past Medical History  Diagnosis Date  . Neuromuscular disorder   . Hypertension   . Hyperlipidemia   . History of kidney stones   . Cancer     prostate  . Hearing loss   . HOH (hard of hearing)   . Depression    BP 168/99  Pulse 62  Resp 14  Ht 5'  8" (1.727 m)  Wt 183 lb (83.008 kg)  BMI 27.83 kg/m2  SpO2 98%  Opioid Risk Score:   Fall Risk Score:      Review of Systems  HENT:       Loss of taste or smell  Genitourinary:       Bladder control problems  Musculoskeletal: Positive for back pain.  Neurological: Positive for numbness.       Tingling, spasms  Psychiatric/Behavioral: Positive for confusion.       Depression  All other systems reviewed and are negative.      Objective:   Physical Exam  Nursing note and vitals reviewed. Constitutional: He is oriented to person, place, and time. He appears well-developed and well-nourished.  HENT:  Head: Normocephalic and atraumatic.  Neck: Normal range of motion. Neck supple.  Cervical Paraspinal Tenderness: C-3- C-5  Cardiovascular: Normal  rate and regular rhythm.   Pulmonary/Chest: Effort normal and breath sounds normal.  Musculoskeletal:  Normal Muscle Bulk and Muscle testing Reveals: Upper Extremities: Full ROM and Muscle Strength 5/5 Spinal Forward Flexion : 45 Degrees and Extension 10 Degrees Lumbar Paraspinal Tenderness: L-3- L-5 Lower Extremities: Full ROM and Muscle Strength 5/5 Arises from Chair with ease Narrow Based Gait  Neurological: He is alert and oriented to person, place, and time.  Skin: Skin is warm and dry.  Psychiatric: He has a normal mood and affect.          Assessment & Plan:  1. L2 compression fracture: Refilled: Hydrocodone 5/325mg  one tablet every 4-6 hours as needed, make take two tablets every 6 hours as needed #90. 2. Cervical post lami syndrome: Continue current medication regime. Also encouraged to continue with exercise and heat therapy.  20 minutes of face to face patient care time was spent during this visit. All questions were encouraged and answered.  F/U in 1 month

## 2014-02-01 DIAGNOSIS — N393 Stress incontinence (female) (male): Secondary | ICD-10-CM | POA: Diagnosis not present

## 2014-02-01 DIAGNOSIS — N529 Male erectile dysfunction, unspecified: Secondary | ICD-10-CM | POA: Diagnosis not present

## 2014-02-01 DIAGNOSIS — C61 Malignant neoplasm of prostate: Secondary | ICD-10-CM | POA: Diagnosis not present

## 2014-02-20 ENCOUNTER — Encounter: Payer: Medicare Other | Attending: Physical Medicine & Rehabilitation | Admitting: Registered Nurse

## 2014-02-20 ENCOUNTER — Encounter: Payer: Self-pay | Admitting: Registered Nurse

## 2014-02-20 VITALS — BP 144/95 | HR 61 | Resp 14 | Wt 181.0 lb

## 2014-02-20 DIAGNOSIS — G8929 Other chronic pain: Secondary | ICD-10-CM

## 2014-02-20 DIAGNOSIS — Z5181 Encounter for therapeutic drug level monitoring: Secondary | ICD-10-CM

## 2014-02-20 DIAGNOSIS — M542 Cervicalgia: Secondary | ICD-10-CM | POA: Diagnosis not present

## 2014-02-20 DIAGNOSIS — Z79899 Other long term (current) drug therapy: Secondary | ICD-10-CM | POA: Diagnosis not present

## 2014-02-20 DIAGNOSIS — M545 Low back pain, unspecified: Secondary | ICD-10-CM

## 2014-02-20 DIAGNOSIS — M961 Postlaminectomy syndrome, not elsewhere classified: Secondary | ICD-10-CM | POA: Insufficient documentation

## 2014-02-20 MED ORDER — HYDROCODONE-ACETAMINOPHEN 5-325 MG PO TABS: ORAL_TABLET | ORAL | Status: DC

## 2014-02-20 NOTE — Progress Notes (Deleted)
Subjective:    Patient ID: Fred [name], male    DOB: [birth year], 63 y.o.   MRN: [id number]  HPI: Fred [name] is a 63 year old male who returns for follow up for chronic pain and medication refill. He says his pain is located in his neck and lower back. He rates his pain 5. His current exercise regime is performing stretching exercises and walking short distances.  He asked if he had to come to the office monthly due to financial hardships. I explained with Hydrocodone he had to come monthly, also explain we can switch him to Tylenol #3 and he could come to the office every three months. He verbalizes understanding. He will think about it and let me know if he's willing to switch.       Pain Inventory Average Pain 6 Pain Right Now 5 My pain is constant and aching  In the last 24 hours, has pain interfered with the following? General activity 8 Relation with others 7 Enjoyment of life 7 What TIME of day is your pain at its worst? daytime and evening Sleep (in general) Fair  Pain is worse with: walking, sitting and standing Pain improves with: rest and medication Relief from Meds: 6  Mobility walk without assistance how many minutes can you walk? 10-15 ability to climb steps?  yes do you drive?  yes  Function retired  Neuro/Psych bladder control problems loss of taste or smell  Prior Studies Any changes since last visit?  no  Physicians involved in your care Any changes since last visit?  no   Family History  Problem Relation Age of Onset  . Stroke Mother   . Heart disease Father   . Stroke Father     heat stroke  . Kidney disease Brother    History   Social History  . Marital Status: Married    Spouse Name: N/A    Number of Children: N/A  . Years of Education: N/A   Social History Main Topics  . Smoking status: Former Smoker -- 1.00 packs/day for 30 years    Types: Cigarettes    Quit date: 05/25/2001  . Smokeless tobacco: Former Systems developer    Quit date: 09/22/2001  . Alcohol Use: No  . Drug Use: No  . Sexual Activity: None   Other Topics Concern  . None   Social History Narrative  . None   Past Surgical History  Procedure Laterality Date  . Spine surgery      cervical disc removed and stabalized with bone- good mobility  . Brain surgery      inserted rod to connect to BaHa(Hearing device)  . Eye surgery      lasik  . Robot assisted laparoscopic radical prostatectomy N/A 08/04/2013    Procedure: ROBOTIC ASSISTED LAPAROSCOPIC RADICAL PROSTATECTOMY LEVEL 2;  Surgeon: Dutch Gray, MD;  Location: [facility];  Service: Urology;  Laterality: N/A;  . Lymphadenectomy Bilateral 08/04/2013    Procedure: LYMPHADENECTOMY;  Surgeon: Dutch Gray, MD;  Location: [facility];  Service: Urology;  Laterality: Bilateral;   Past Medical History  Diagnosis Date  . Neuromuscular disorder   . Hypertension   . Hyperlipidemia   . History of kidney stones   . Cancer     prostate  . Hearing loss   . HOH (hard of hearing)   . Depression    BP 144/95  Pulse 61  Resp 14  Wt 181 lb (82.101 kg)  SpO2 98%  Opioid  Risk Score:   Fall Risk Score: Moderate Fall Risk (6-13 points) (previoulsy educated and given handout) Review of Systems  Constitutional:       Loss of taste or smell  Genitourinary:       Bladder control problems  All other systems reviewed and are negative.      Objective:   Physical Exam        Assessment & Plan:  1. L2 compression fracture:  Refilled: Hydrocodone 5/325mg  one tablet every 4-6 hours as needed, make take two tablets every 6 hours as needed #90.  2. Cervical post lami syndrome: Continue current medication regime. Also encouraged to continue with exercise and heat therapy.  20 minutes of face to face patient care time was spent during this visit. All questions were encouraged and answered.  F/U in 1 month

## 2014-02-20 NOTE — Progress Notes (Signed)
Subjective:    Patient ID: Fred [name], male    DOB: 24-Dec-1950, 63 y.o.   MRN: [id number]  HPI: [name] is a 63 year old male who returns for follow up for chronic pain and medication refill. He says his pain is located in his neck and lower back. He rates his pain 5. His current exercise regime is walking.  Pain Inventory Average Pain 6 Pain Right Now 5 My pain is constant and aching  In the last 24 hours, has pain interfered with the following? General activity 8 Relation with others 7 Enjoyment of life 7 What TIME of day is your pain at its worst? daytime and evening Sleep (in general) Fair  Pain is worse with: walking, sitting and standing Pain improves with: rest and medication Relief from Meds: 6  Mobility walk without assistance how many minutes can you walk? 10-15 ability to climb steps?  yes do you drive?  yes  Function retired  Neuro/Psych bladder control problems loss of taste or smell  Prior Studies Any changes since last visit?  no  Physicians involved in your care Any changes since last visit?  no   Family History  Problem Relation Age of Onset  . Stroke Mother   . Heart disease Father   . Stroke Father     heat stroke  . Kidney disease Brother    History   Social History  . Marital Status: Married    Spouse Name: N/A    Number of Children: N/A  . Years of Education: N/A   Social History Main Topics  . Smoking status: Former Smoker -- 1.00 packs/day for 30 years    Types: Cigarettes    Quit date: 05/25/2001  . Smokeless tobacco: Former Systems developer    Quit date: 09/22/2001  . Alcohol Use: No  . Drug Use: No  . Sexual Activity: None   Other Topics Concern  . None   Social History Narrative  . None   Past Surgical History  Procedure Laterality Date  . Spine surgery      cervical disc removed and stabalized with bone- good mobility  . Brain surgery      inserted rod to connect to BaHa(Hearing device)  . Eye  surgery      lasik  . Robot assisted laparoscopic radical prostatectomy N/A 08/04/2013    Procedure: ROBOTIC ASSISTED LAPAROSCOPIC RADICAL PROSTATECTOMY LEVEL 2;  Surgeon: Dutch Gray, MD;  Location: [facility];  Service: Urology;  Laterality: N/A;  . Lymphadenectomy Bilateral 08/04/2013    Procedure: LYMPHADENECTOMY;  Surgeon: Dutch Gray, MD;  Location: [facility];  Service: Urology;  Laterality: Bilateral;   Past Medical History  Diagnosis Date  . Neuromuscular disorder   . Hypertension   . Hyperlipidemia   . History of kidney stones   . Cancer     prostate  . Hearing loss   . HOH (hard of hearing)   . Depression    BP 144/95  Pulse 61  Resp 14  Wt 82.101 kg (181 lb)  SpO2 98%  Opioid Risk Score:   Fall Risk Score: Moderate Fall Risk (6-13 points) (previoulsy educated and given handout) Review of Systems  Constitutional:       Loss of taste or smell  Genitourinary:       Bladder control problems  All other systems reviewed and are negative.      Objective:   Physical Exam  Nursing note and vitals reviewed. Constitutional: He is  oriented to person, place, and time. He appears well-developed and well-nourished.  HENT:  Head: Normocephalic and atraumatic.  Neck: Normal range of motion. Neck supple.  Cervical Paraspinal Tenderness: C-4 - C-6  Cardiovascular: Normal rate and regular rhythm.   Pulmonary/Chest: Effort normal and breath sounds normal.  Musculoskeletal:  Normal Muscle Bulk and Muscle Testing Reveals: Upper Extremities: Full ROM and Muscle Strength 5/5 Spinal Forward Flexion 45 Degrees and Extension 20 Degrees Lumbar  Paraspinal Tenderness: L-1_ L-4 Lower Extremities: Full ROM and Muscle Strength 5/5 Arises from chair with ease Narrow Based Gait    Neurological: He is alert and oriented to person, place, and time.  Skin: Skin is warm and dry.  Psychiatric: He has a normal mood and affect.          Assessment & Plan:  1. L2 compression fracture:    Refilled: Hydrocodone 5/325mg  one tablet every 4-6 hours as needed, make take two tablets every 6 hours as needed #90.  2. Cervical post lami syndrome: Continue current medication regime. Also encouraged to continue with exercise and heat therapy.  15 minutes of face to face patient care time was spent during this visit. All questions were encouraged and answered.   F/U in 1 month

## 2014-02-23 DIAGNOSIS — Z23 Encounter for immunization: Secondary | ICD-10-CM | POA: Diagnosis not present

## 2014-03-24 ENCOUNTER — Encounter: Payer: Self-pay | Admitting: Registered Nurse

## 2014-03-24 ENCOUNTER — Encounter: Payer: Medicare Other | Attending: Physical Medicine & Rehabilitation | Admitting: Registered Nurse

## 2014-03-24 VITALS — BP 148/85 | HR 54 | Resp 14 | Ht 68.0 in | Wt 182.0 lb

## 2014-03-24 DIAGNOSIS — G8929 Other chronic pain: Secondary | ICD-10-CM | POA: Insufficient documentation

## 2014-03-24 DIAGNOSIS — M545 Low back pain: Secondary | ICD-10-CM

## 2014-03-24 DIAGNOSIS — Z5181 Encounter for therapeutic drug level monitoring: Secondary | ICD-10-CM | POA: Insufficient documentation

## 2014-03-24 DIAGNOSIS — Z79899 Other long term (current) drug therapy: Secondary | ICD-10-CM | POA: Insufficient documentation

## 2014-03-24 DIAGNOSIS — M961 Postlaminectomy syndrome, not elsewhere classified: Secondary | ICD-10-CM | POA: Diagnosis not present

## 2014-03-24 DIAGNOSIS — M542 Cervicalgia: Secondary | ICD-10-CM | POA: Diagnosis not present

## 2014-03-24 MED ORDER — HYDROCODONE-ACETAMINOPHEN 5-325 MG PO TABS: ORAL_TABLET | ORAL | Status: DC

## 2014-03-24 NOTE — Progress Notes (Signed)
Subjective:    Patient ID: Fred [name], male    DOB: May 10, 1951, 63 y.o.   MRN: [id number]  HPI: Fred [name] is a 63 year old male who returns for follow up for chronic pain and medication refill. He says his pain is located in his neck and lower back. He rates his pain 6. His current exercise regime is walking and performing stretching exercises.  Pain Inventory Average Pain 5 Pain Right Now 6 My pain is tingling and aching  In the last 24 hours, has pain interfered with the following? General activity 7 Relation with others 7 Enjoyment of life 6 What TIME of day is your pain at its worst? evening Sleep (in general) Fair  Pain is worse with: walking, bending and sitting Pain improves with: heat/ice and medication Relief from Meds: 5  Mobility walk without assistance how many minutes can you walk? 10 ability to climb steps?  yes do you drive?  yes  Function disabled: date disabled 2001  Neuro/Psych bladder control problems  Prior Studies Any changes since last visit?  no  Physicians involved in your care Any changes since last visit?  no   Family History  Problem Relation Age of Onset  . Stroke Mother   . Heart disease Father   . Stroke Father     heat stroke  . Kidney disease Brother    History   Social History  . Marital Status: Married    Spouse Name: N/A    Number of Children: N/A  . Years of Education: N/A   Social History Main Topics  . Smoking status: Former Smoker -- 1.00 packs/day for 30 years    Types: Cigarettes    Quit date: 05/25/2001  . Smokeless tobacco: Former Systems developer    Quit date: 09/22/2001  . Alcohol Use: No  . Drug Use: No  . Sexual Activity: None   Other Topics Concern  . None   Social History Narrative   Past Surgical History  Procedure Laterality Date  . Spine surgery      cervical disc removed and stabalized with bone- good mobility  . Brain surgery      inserted rod to connect to BaHa(Hearing device)    . Eye surgery      lasik  . Robot assisted laparoscopic radical prostatectomy N/A 08/04/2013    Procedure: ROBOTIC ASSISTED LAPAROSCOPIC RADICAL PROSTATECTOMY LEVEL 2;  Surgeon: Dutch Gray, MD;  Location: [facility];  Service: Urology;  Laterality: N/A;  . Lymphadenectomy Bilateral 08/04/2013    Procedure: LYMPHADENECTOMY;  Surgeon: Dutch Gray, MD;  Location: [facility];  Service: Urology;  Laterality: Bilateral;   Past Medical History  Diagnosis Date  . Neuromuscular disorder   . Hypertension   . Hyperlipidemia   . History of kidney stones   . Cancer     prostate  . Hearing loss   . HOH (hard of hearing)   . Depression    BP 148/85 mmHg  Pulse 54  Resp 14  Ht 5\' 8"  (1.727 m)  Wt 182 lb (82.555 kg)  BMI 27.68 kg/m2  SpO2 97%  Opioid Risk Score:   Fall Risk Score: Moderate Fall Risk (6-13 points) Review of Systems     Objective:   Physical Exam  Constitutional: He is oriented to person, place, and time. He appears well-developed and well-nourished.  HENT:  Head: Normocephalic and atraumatic.  Eyes:  Left eyebrow abrasion  Neck: Normal range of motion. Neck supple.  Cervical  Paraspinal Tenderness: C-3- C-5  Cardiovascular: Normal rate and regular rhythm.   Pulmonary/Chest: Effort normal and breath sounds normal.  Musculoskeletal:  Normal Muscle Bulk and Muscle testing Reveals: Upper extremities: Full ROM and Muscle strength 5/5 Lumbar Paraspinal Tenderness: L-3- L-5 Lower Extremities: Full ROM and Muscle strength 5/5 Arises from chair with ease Narrow based gait  Neurological: He is alert and oriented to person, place, and time.  Skin: Skin is warm and dry.  Psychiatric: He has a normal mood and affect.  Nursing note and vitals reviewed.         Assessment & Plan:  1. L2 compression fracture:  Refilled: Hydrocodone 5/325mg  one tablet every 4-6 hours as needed, make take two tablets every 6 hours as needed #90.  2. Cervical post lami syndrome: Continue current  medication regime. Also encouraged to continue with exercise and heat therapy.  15 minutes of face to face patient care time was spent during this visit. All questions were encouraged and answered.   F/U in 1 month

## 2014-03-27 ENCOUNTER — Ambulatory Visit: Payer: Medicare Other | Admitting: Registered Nurse

## 2014-04-18 ENCOUNTER — Ambulatory Visit
Admission: RE | Admit: 2014-04-18 | Discharge: 2014-04-18 | Disposition: A | Discharge status: Home / Self Care | Payer: Medicare Other | Source: Ambulatory Visit | Attending: Family Medicine | Admitting: Family Medicine

## 2014-04-18 ENCOUNTER — Other Ambulatory Visit: Payer: Self-pay | Admitting: Family Medicine

## 2014-04-18 DIAGNOSIS — R05 Cough: Secondary | ICD-10-CM | POA: Diagnosis not present

## 2014-04-18 DIAGNOSIS — F324 Major depressive disorder, single episode, in partial remission: Secondary | ICD-10-CM | POA: Diagnosis not present

## 2014-04-18 DIAGNOSIS — N183 Chronic kidney disease, stage 3 (moderate): Secondary | ICD-10-CM | POA: Diagnosis not present

## 2014-04-18 DIAGNOSIS — I10 Essential (primary) hypertension: Secondary | ICD-10-CM | POA: Diagnosis not present

## 2014-04-18 DIAGNOSIS — R059 Cough, unspecified: Secondary | ICD-10-CM

## 2014-04-18 DIAGNOSIS — M545 Low back pain: Secondary | ICD-10-CM | POA: Diagnosis not present

## 2014-04-18 DIAGNOSIS — Z0000 Encounter for general adult medical examination without abnormal findings: Secondary | ICD-10-CM | POA: Diagnosis not present

## 2014-04-18 DIAGNOSIS — N529 Male erectile dysfunction, unspecified: Secondary | ICD-10-CM | POA: Diagnosis not present

## 2014-04-18 DIAGNOSIS — E782 Mixed hyperlipidemia: Secondary | ICD-10-CM | POA: Diagnosis not present

## 2014-04-26 ENCOUNTER — Encounter: Payer: Medicare Other | Attending: Physical Medicine & Rehabilitation | Admitting: Registered Nurse

## 2014-04-26 ENCOUNTER — Encounter: Payer: Self-pay | Admitting: Registered Nurse

## 2014-04-26 VITALS — BP 130/79 | HR 50 | Resp 14 | Ht 67.0 in | Wt 183.0 lb

## 2014-04-26 DIAGNOSIS — M545 Low back pain: Secondary | ICD-10-CM | POA: Insufficient documentation

## 2014-04-26 DIAGNOSIS — M961 Postlaminectomy syndrome, not elsewhere classified: Secondary | ICD-10-CM | POA: Insufficient documentation

## 2014-04-26 DIAGNOSIS — Z79899 Other long term (current) drug therapy: Secondary | ICD-10-CM | POA: Diagnosis not present

## 2014-04-26 DIAGNOSIS — Z5181 Encounter for therapeutic drug level monitoring: Secondary | ICD-10-CM | POA: Diagnosis not present

## 2014-04-26 DIAGNOSIS — G8929 Other chronic pain: Secondary | ICD-10-CM | POA: Insufficient documentation

## 2014-04-26 DIAGNOSIS — M542 Cervicalgia: Secondary | ICD-10-CM | POA: Diagnosis not present

## 2014-04-26 MED ORDER — HYDROCODONE-ACETAMINOPHEN 5-325 MG PO TABS: ORAL_TABLET | ORAL | Status: DC

## 2014-04-26 NOTE — Progress Notes (Addendum)
Subjective:    Patient ID: Fred [name], male    DOB: 10-Jan-1951, 63 y.o.   MRN: [id number]  HPI: [name] is a 63 year old male who returns for follow up for chronic pain and medication refill. He says his pain is located in his neck and lower back. He rates his pain 3. His current exercise regime is walking and performing stretching exercises. Arrived bradycardic apical pulse checked heart rate 60.  Pain Inventory Average Pain 5 Pain Right Now 3 My pain is stabbing, tingling and aching  In the last 24 hours, has pain interfered with the following? General activity 4 Relation with others 3 Enjoyment of life 4 What TIME of day is your pain at its worst? daytime, evening Sleep (in general) Fair  Pain is worse with: sitting and standing Pain improves with: medication Relief from Meds: 5  Mobility walk without assistance how many minutes can you walk? 10 ability to climb steps?  yes do you drive?  yes  Function retired  Neuro/Psych numbness tingling depression  Prior Studies Any changes since last visit?  no  Physicians involved in your care Any changes since last visit?  no   Family History  Problem Relation Age of Onset  . Stroke Mother   . Heart disease Father   . Stroke Father     heat stroke  . Kidney disease Brother    History   Social History  . Marital Status: Married    Spouse Name: N/A    Number of Children: N/A  . Years of Education: N/A   Social History Main Topics  . Smoking status: Former Smoker -- 1.00 packs/day for 30 years    Types: Cigarettes    Quit date: 05/25/2001  . Smokeless tobacco: Former Systems developer    Quit date: 09/22/2001  . Alcohol Use: No  . Drug Use: No  . Sexual Activity: None   Other Topics Concern  . None   Social History Narrative   Past Surgical History  Procedure Laterality Date  . Spine surgery      cervical disc removed and stabalized with bone- good mobility  . Brain surgery      inserted  rod to connect to BaHa(Hearing device)  . Eye surgery      lasik  . Robot assisted laparoscopic radical prostatectomy N/A 08/04/2013    Procedure: ROBOTIC ASSISTED LAPAROSCOPIC RADICAL PROSTATECTOMY LEVEL 2;  Surgeon: Dutch Gray, MD;  Location: [facility];  Service: Urology;  Laterality: N/A;  . Lymphadenectomy Bilateral 08/04/2013    Procedure: LYMPHADENECTOMY;  Surgeon: Dutch Gray, MD;  Location: [facility];  Service: Urology;  Laterality: Bilateral;   Past Medical History  Diagnosis Date  . Neuromuscular disorder   . Hypertension   . Hyperlipidemia   . History of kidney stones   . Cancer     prostate  . Hearing loss   . HOH (hard of hearing)   . Depression    BP 130/79 mmHg  Pulse 50  Resp 14  Ht 5\' 7"  (1.702 m)  Wt 183 lb (83.008 kg)  BMI 28.66 kg/m2  SpO2 98%  Opioid Risk Score:   Fall Risk Score: Low Fall Risk (0-5 points) (pt declined information) Review of Systems  Neurological: Positive for numbness.       Tingling  Psychiatric/Behavioral: Positive for dysphoric mood.  All other systems reviewed and are negative.      Objective:   Physical Exam  Constitutional: He is oriented  to person, place, and time. He appears well-developed and well-nourished.  HENT:  Head: Normocephalic and atraumatic.  Neck: Normal range of motion. Neck supple.  Cervical Paraspinal Tenderness: C-3- C-5  Cardiovascular: Normal rate and regular rhythm.   Pulmonary/Chest: Effort normal and breath sounds normal.  Musculoskeletal:  Normal Muscle Bulk and Muscle Testing Reveals: Upper Extremities: Full ROM and Muscle strength 5/5 Spinal Forward Flexion 30 Degrees and extension 20 Degrees Thoracic Paraspinal Tenderness: T-1- T-3 Lumbar Paraspinal Tenderness: L-3- L-5 Lower Extremities: L-3- L-5 Arises from chair with ease  Neurological: He is alert and oriented to person, place, and time.  Skin: Skin is warm and dry.  Psychiatric: He has a normal mood and affect.  Nursing note and vitals  reviewed.         Assessment & Plan:  1. L2 compression fracture:  Refilled: Hydrocodone 5/325mg  one tablet every 4-6 hours as needed, make take two tablets every 6 hours as needed #90.  2. Cervical post lami syndrome: Continue current medication regime. Also encouraged to continue with exercise and heat therapy.  15 minutes of face to face patient care time was spent during this visit. All questions were encouraged and answered.   F/U in 1 month

## 2014-05-29 ENCOUNTER — Encounter: Payer: Medicare Other | Attending: Registered Nurse | Admitting: Registered Nurse

## 2014-05-29 ENCOUNTER — Encounter: Payer: Self-pay | Admitting: Registered Nurse

## 2014-05-29 VITALS — BP 122/77 | HR 56 | Resp 14

## 2014-05-29 DIAGNOSIS — M961 Postlaminectomy syndrome, not elsewhere classified: Secondary | ICD-10-CM | POA: Diagnosis not present

## 2014-05-29 DIAGNOSIS — G8929 Other chronic pain: Secondary | ICD-10-CM | POA: Diagnosis not present

## 2014-05-29 DIAGNOSIS — M4856XD Collapsed vertebra, not elsewhere classified, lumbar region, subsequent encounter for fracture with routine healing: Secondary | ICD-10-CM | POA: Insufficient documentation

## 2014-05-29 DIAGNOSIS — Z79899 Other long term (current) drug therapy: Secondary | ICD-10-CM

## 2014-05-29 DIAGNOSIS — Y838 Other surgical procedures as the cause of abnormal reaction of the patient, or of later complication, without mention of misadventure at the time of the procedure: Secondary | ICD-10-CM | POA: Insufficient documentation

## 2014-05-29 DIAGNOSIS — M545 Low back pain, unspecified: Secondary | ICD-10-CM

## 2014-05-29 DIAGNOSIS — M542 Cervicalgia: Secondary | ICD-10-CM | POA: Diagnosis not present

## 2014-05-29 DIAGNOSIS — Z5181 Encounter for therapeutic drug level monitoring: Secondary | ICD-10-CM

## 2014-05-29 MED ORDER — HYDROCODONE-ACETAMINOPHEN 5-325 MG PO TABS: ORAL_TABLET | ORAL | Status: DC

## 2014-05-29 NOTE — Progress Notes (Signed)
Subjective:    Patient ID: Fred [name], male    DOB: October 18, 1950, 64 y.o.   MRN: [id number]  HPI: [name] is a 64 year old male who returns for follow up for chronic pain and medication refill. He says his pain is located in his neck and lower back. He rates his pain 3. His current exercise regime is walking and performing stretching exercises. Arrived bradycardic apical pulse checked heart rate 61.   Pain Inventory Average Pain 4 Pain Right Now 3 My pain is stabbing and tingling  In the last 24 hours, has pain interfered with the following? General activity 4 Relation with others 3 Enjoyment of life 4 What TIME of day is your pain at its worst? evening Sleep (in general) Fair  Pain is worse with: bending, sitting and standing Pain improves with: medication Relief from Meds: 4  Mobility walk without assistance ability to climb steps?  yes do you drive?  yes  Function disabled: date disabled 2000  Neuro/Psych numbness depression loss of taste or smell  Prior Studies Any changes since last visit?  no  Physicians involved in your care Any changes since last visit?  no   Family History  Problem Relation Age of Onset  . Stroke Mother   . Heart disease Father   . Stroke Father     heat stroke  . Kidney disease Brother    History   Social History  . Marital Status: Married    Spouse Name: N/A    Number of Children: N/A  . Years of Education: N/A   Social History Main Topics  . Smoking status: Former Smoker -- 1.00 packs/day for 30 years    Types: Cigarettes    Quit date: 05/25/2001  . Smokeless tobacco: Former Systems developer    Quit date: 09/22/2001  . Alcohol Use: No  . Drug Use: No  . Sexual Activity: None   Other Topics Concern  . None   Social History Narrative   Past Surgical History  Procedure Laterality Date  . Spine surgery      cervical disc removed and stabalized with bone- good mobility  . Brain surgery      inserted rod to  connect to BaHa(Hearing device)  . Eye surgery      lasik  . Robot assisted laparoscopic radical prostatectomy N/A 08/04/2013    Procedure: ROBOTIC ASSISTED LAPAROSCOPIC RADICAL PROSTATECTOMY LEVEL 2;  Surgeon: Dutch Gray, MD;  Location: [facility];  Service: Urology;  Laterality: N/A;  . Lymphadenectomy Bilateral 08/04/2013    Procedure: LYMPHADENECTOMY;  Surgeon: Dutch Gray, MD;  Location: [facility];  Service: Urology;  Laterality: Bilateral;   Past Medical History  Diagnosis Date  . Neuromuscular disorder   . Hypertension   . Hyperlipidemia   . History of kidney stones   . Cancer     prostate  . Hearing loss   . HOH (hard of hearing)   . Depression    BP 122/77 mmHg  Pulse 56  Resp 14  SpO2 96%  Opioid Risk Score:   Fall Risk Score: Moderate Fall Risk (6-13 points) (previously educated and given handout) Review of Systems  Constitutional:       Loss of taste or smell  Neurological: Positive for numbness.  Psychiatric/Behavioral: Positive for dysphoric mood.  All other systems reviewed and are negative.      Objective:   Physical Exam  Constitutional: He is oriented to person, place, and time. He appears well-developed  and well-nourished.  HENT:  Head: Normocephalic and atraumatic.  Neck: Normal range of motion. Neck supple.  Cervical Paraspinal Tenderness: C-3- C-5   Cardiovascular: Normal rate and regular rhythm.   Pulmonary/Chest: Effort normal and breath sounds normal.  Musculoskeletal:  Normal Muscle Bulk and Muscle testing Reveals: Upper Extremities: Full ROM and Muscle strength 5/5 Spinal Forward Flexion 90 Degrees and extension 20 Degrees Thoracic and Lumbar Hypersensitivity Lower extremities: Full ROM and Muscle strength 5/5 Arises from chair with ease Narrow Based gait  Neurological: He is alert and oriented to person, place, and time.  Skin: Skin is warm and dry.  Psychiatric: He has a normal mood and affect.  Nursing note and vitals reviewed.          Assessment & Plan:  1. L2 compression fracture:  Refilled: Hydrocodone 5/325mg  one tablet every 4-6 hours as needed, make take two tablets every 6 hours as needed #90.  2. Cervical post lami syndrome: Continue current medication regime. Also encouraged to continue with exercise and heat therapy.  15 minutes of face to face patient care time was spent during this visit. All questions were encouraged and answered.   F/U in 1 month

## 2014-06-29 ENCOUNTER — Encounter: Payer: Self-pay | Admitting: Registered Nurse

## 2014-06-29 ENCOUNTER — Encounter: Payer: Medicare Other | Attending: Physical Medicine & Rehabilitation | Admitting: Registered Nurse

## 2014-06-29 VITALS — BP 118/72 | HR 58 | Resp 14

## 2014-06-29 DIAGNOSIS — M542 Cervicalgia: Secondary | ICD-10-CM | POA: Insufficient documentation

## 2014-06-29 DIAGNOSIS — Z79899 Other long term (current) drug therapy: Secondary | ICD-10-CM

## 2014-06-29 DIAGNOSIS — M961 Postlaminectomy syndrome, not elsewhere classified: Secondary | ICD-10-CM | POA: Insufficient documentation

## 2014-06-29 DIAGNOSIS — M545 Low back pain: Secondary | ICD-10-CM

## 2014-06-29 DIAGNOSIS — Z5181 Encounter for therapeutic drug level monitoring: Secondary | ICD-10-CM | POA: Insufficient documentation

## 2014-06-29 DIAGNOSIS — G8929 Other chronic pain: Secondary | ICD-10-CM | POA: Diagnosis not present

## 2014-06-29 MED ORDER — HYDROCODONE-ACETAMINOPHEN 5-325 MG PO TABS: ORAL_TABLET | ORAL | Status: DC

## 2014-06-29 NOTE — Progress Notes (Deleted)
   Subjective:    Patient ID: Fred [name], male    DOB: Feb 28, 1951, 64 y.o.   MRN: [id number]  HPI   .cprm Review of Systems     Objective:   Physical Exam        Assessment & Plan:

## 2014-06-29 NOTE — Progress Notes (Signed)
Subjective:    Patient ID: Fred [name], male    DOB: 01/08/1951, 64 y.o.   MRN: [id number]  HPI: [name] is a 64 year old male who returns for follow up for chronic pain and medication refill. He says his pain is located in his neck, right shoulder and lower back. He rates his pain 4. His current exercise regime is walking and performing stretching exercises. Arrived bradycardic apical pulse checked heart rate 72. Mr. Maynez is having financial hardship at this time his employer dropped his medical insurance and he had to purchase a supplemental insurance for himself and obtain insurance for his wife. Will give him two scripts this month he verbalizes understanding.  Pain Inventory Average Pain 6 Pain Right Now 4 My pain is stabbing, tingling and aching  In the last 24 hours, has pain interfered with the following? General activity 5 Relation with others 6 Enjoyment of life 6 What TIME of day is your pain at its worst? evening Sleep (in general) Fair  Pain is worse with: sitting and standing Pain improves with: rest and medication Relief from Meds: 4  Mobility how many minutes can you walk? 10 ability to climb steps?  yes do you drive?  yes transfers alone  Function disabled: date disabled 2000  Neuro/Psych numbness tremor loss of taste or smell  Prior Studies Any changes since last visit?  no  Physicians involved in your care Any changes since last visit?  no   Family History  Problem Relation Age of Onset  . Stroke Mother   . Heart disease Father   . Stroke Father     heat stroke  . Kidney disease Brother    History   Social History  . Marital Status: Married    Spouse Name: N/A  . Number of Children: N/A  . Years of Education: N/A   Social History Main Topics  . Smoking status: Former Smoker -- 1.00 packs/day for 30 years    Types: Cigarettes    Quit date: 05/25/2001  . Smokeless tobacco: Former Systems developer    Quit date: 09/22/2001    . Alcohol Use: No  . Drug Use: No  . Sexual Activity: Not on file   Other Topics Concern  . None   Social History Narrative   Past Surgical History  Procedure Laterality Date  . Spine surgery      cervical disc removed and stabalized with bone- good mobility  . Brain surgery      inserted rod to connect to BaHa(Hearing device)  . Eye surgery      lasik  . Robot assisted laparoscopic radical prostatectomy N/A 08/04/2013    Procedure: ROBOTIC ASSISTED LAPAROSCOPIC RADICAL PROSTATECTOMY LEVEL 2;  Surgeon: Dutch Gray, MD;  Location: [facility];  Service: Urology;  Laterality: N/A;  . Lymphadenectomy Bilateral 08/04/2013    Procedure: LYMPHADENECTOMY;  Surgeon: Dutch Gray, MD;  Location: [facility];  Service: Urology;  Laterality: Bilateral;   Past Medical History  Diagnosis Date  . Neuromuscular disorder   . Hypertension   . Hyperlipidemia   . History of kidney stones   . Cancer     prostate  . Hearing loss   . HOH (hard of hearing)   . Depression    BP 118/72 mmHg  Pulse 58  Resp 14  SpO2 98%  Opioid Risk Score:   Fall Risk Score: Low Fall Risk (0-5 points) Review of Systems  Constitutional:  Loss of smell  Respiratory: Positive for shortness of breath.   Neurological: Positive for tremors and numbness.  All other systems reviewed and are negative.      Objective:   Physical Exam  Constitutional: He is oriented to person, place, and time. He appears well-developed and well-nourished.  HENT:  Head: Normocephalic and atraumatic.  Neck: Normal range of motion. Neck supple.  Cervical Paraspinal Tenderness: C-3- C-5  Cardiovascular: Normal rate and regular rhythm.   Pulmonary/Chest: Effort normal and breath sounds normal.  Musculoskeletal:  Normal Muscle Bulk and Muscle Testing Reveals: Upper Extremities: Full ROM and Muscle Strength 5/5 Spinal Forward Flexion 45 Degrees and Extension 20 Degrees Thoracic Paraspinal Tenderness: T-1- T-3  ( Mainly Right  Side) Lumbar Paraspinal Tenderness: L-3- L-5 Lower Extremities: Full ROM and Muscle strength 5/5 Arises from chair with ease     Neurological: He is alert and oriented to person, place, and time.  Skin: Skin is warm and dry.  Psychiatric: He has a normal mood and affect.  Nursing note and vitals reviewed.         Assessment & Plan:  1. L2 compression fracture:  Refilled: Hydrocodone 5/325mg  one tablet every 4-6 hours as needed, make take two tablets every 6 hours as needed #90. Second script given. 2. Cervical post lami syndrome: Continue current medication regime. Also encouraged to continue with exercise and heat therapy.  15 minutes of face to face patient care time was spent during this visit. All questions were encouraged and answered.   F/U in 1 month

## 2014-08-28 ENCOUNTER — Encounter: Payer: Self-pay | Admitting: Registered Nurse

## 2014-08-28 ENCOUNTER — Other Ambulatory Visit: Payer: Self-pay | Admitting: Physical Medicine & Rehabilitation

## 2014-08-28 ENCOUNTER — Encounter: Payer: Medicare Other | Attending: Physical Medicine & Rehabilitation | Admitting: Registered Nurse

## 2014-08-28 VITALS — BP 113/59 | HR 61 | Resp 14

## 2014-08-28 DIAGNOSIS — Z5181 Encounter for therapeutic drug level monitoring: Secondary | ICD-10-CM | POA: Diagnosis not present

## 2014-08-28 DIAGNOSIS — G8929 Other chronic pain: Secondary | ICD-10-CM | POA: Diagnosis not present

## 2014-08-28 DIAGNOSIS — G894 Chronic pain syndrome: Secondary | ICD-10-CM | POA: Diagnosis not present

## 2014-08-28 DIAGNOSIS — M545 Low back pain: Secondary | ICD-10-CM | POA: Diagnosis present

## 2014-08-28 DIAGNOSIS — M542 Cervicalgia: Secondary | ICD-10-CM | POA: Diagnosis not present

## 2014-08-28 DIAGNOSIS — Z79899 Other long term (current) drug therapy: Secondary | ICD-10-CM | POA: Diagnosis not present

## 2014-08-28 DIAGNOSIS — M961 Postlaminectomy syndrome, not elsewhere classified: Secondary | ICD-10-CM

## 2014-08-28 MED ORDER — ACETAMINOPHEN-CODEINE #3 300-30 MG PO TABS: 1.0000 | ORAL_TABLET | Freq: Three times a day (TID) | ORAL | Status: DC | PRN

## 2014-08-28 NOTE — Progress Notes (Signed)
Subjective:    Patient ID: Fred [name], male    DOB: Jan 06, 1951, 64 y.o.   MRN: [id number]  HPI: Fred [name] is a 64 year old male who returns for follow up for chronic pain and medication refill. He says his pain is located in his neck and lower back. He rates his pain 5. His current exercise regime is walking and performing stretching exercises. Also a little yard work. Fred [name] states he can't afford to come monthly we discussed other medications. He's willing to try Tylenol #3.   Pain Inventory Average Pain 6 Pain Right Now 5 My pain is sharp, tingling and aching  In the last 24 hours, has pain interfered with the following? General activity 6 Relation with others 7 Enjoyment of life 6 What TIME of day is your pain at its worst? daytime, evening Sleep (in general) Fair  Pain is worse with: bending, sitting and standing Pain improves with: rest and medication Relief from Meds: 3  Mobility walk without assistance  Function retired  Neuro/Psych No problems in this area  Prior Studies Any changes since last visit?  no  Physicians involved in your care Any changes since last visit?  no   Family History  Problem Relation Age of Onset  . Stroke Mother   . Heart disease Father   . Stroke Father     heat stroke  . Kidney disease Brother    History   Social History  . Marital Status: Married    Spouse Name: N/A  . Number of Children: N/A  . Years of Education: N/A   Social History Main Topics  . Smoking status: Former Smoker -- 1.00 packs/day for 30 years    Types: Cigarettes    Quit date: 05/25/2001  . Smokeless tobacco: Former Systems developer    Quit date: 09/22/2001  . Alcohol Use: No  . Drug Use: No  . Sexual Activity: Not on file   Other Topics Concern  . None   Social History Narrative   Past Surgical History  Procedure Laterality Date  . Spine surgery      cervical disc removed and stabalized with bone- good mobility  . Brain surgery       inserted rod to connect to BaHa(Hearing device)  . Eye surgery      lasik  . Robot assisted laparoscopic radical prostatectomy N/A 08/04/2013    Procedure: ROBOTIC ASSISTED LAPAROSCOPIC RADICAL PROSTATECTOMY LEVEL 2;  Surgeon: Dutch Gray, MD;  Location: [facility];  Service: Urology;  Laterality: N/A;  . Lymphadenectomy Bilateral 08/04/2013    Procedure: LYMPHADENECTOMY;  Surgeon: Dutch Gray, MD;  Location: [facility];  Service: Urology;  Laterality: Bilateral;   Past Medical History  Diagnosis Date  . Neuromuscular disorder   . Hypertension   . Hyperlipidemia   . History of kidney stones   . Cancer     prostate  . Hearing loss   . HOH (hard of hearing)   . Depression    BP 113/59 mmHg  Pulse 61  Resp 14  SpO2 97%  Opioid Risk Score:   Fall Risk Score: Low Fall Risk (0-5 points) (patient previously educated)`1  Depression screen PHQ 2/9  Depression screen PHQ 2/9 08/28/2014  Decreased Interest 2  Down, Depressed, Hopeless 1  PHQ - 2 Score 3  Altered sleeping 1  Tired, decreased energy 2  Change in appetite 0  Feeling bad or failure about yourself  0  Trouble concentrating 1  Moving  slowly or fidgety/restless 0  Suicidal thoughts 0  PHQ-9 Score 7     Review of Systems  All other systems reviewed and are negative.      Objective:   Physical Exam  Constitutional: He is oriented to person, place, and time. He appears well-developed and well-nourished.  HENT:  Head: Normocephalic and atraumatic.  Neck: Normal range of motion. Neck supple.  Cardiovascular: Normal rate and regular rhythm.   Pulmonary/Chest: Effort normal and breath sounds normal.  Musculoskeletal:  Normal Muscle Bulk and Muscle Testing Reveals: Upper Extremities: Full ROM and Muscle Strength 5/5 Thoracic Paraspinal Tenderness: T- 10- T-12 Lumbar Paraspinal Tenderness: L-3- L-5 Lower Extremities: Full ROM and Muscle Strength 5/5 Arises from chair with ease Narrow Based Gait  Neurological: He is  alert and oriented to person, place, and time.  Skin: Skin is warm and dry.  Psychiatric: He has a normal mood and affect.  Nursing note and vitals reviewed.         Assessment & Plan:  1. L2 compression fracture:   Hydrocodone 5/325mg  Discontinued RX: Tylenol #3  300-30 mg one tablet every 8 hours as needed for moderate pain #90 2. Cervical post lami syndrome: Continue current medication regime. Also encouraged to continue with exercise and heat therapy.  15 minutes of face to face patient care time was spent during this visit. All questions were encouraged and answered.   F/U in 3 month

## 2014-08-29 LAB — PMP ALCOHOL METABOLITE (ETG): Ethyl Glucuronide (EtG): NEGATIVE ng/mL

## 2014-09-01 LAB — PRESCRIPTION MONITORING PROFILE (SOLSTAS)
Amphetamine/Meth: NEGATIVE ng/mL
Barbiturate Screen, Urine: NEGATIVE ng/mL
Benzodiazepine Screen, Urine: NEGATIVE ng/mL
Buprenorphine, Urine: NEGATIVE ng/mL
COCAINE METABOLITES: NEGATIVE ng/mL
CREATININE, URINE: 131.01 mg/dL (ref >20.0)
Cannabinoid Scrn, Ur: NEGATIVE ng/mL
Carisoprodol, Urine: NEGATIVE ng/mL
Fentanyl, Ur: NEGATIVE ng/mL
MDMA URINE: NEGATIVE ng/mL
MEPERIDINE UR: NEGATIVE ng/mL
Methadone Screen, Urine: NEGATIVE ng/mL
Nitrites, Initial: NEGATIVE ug/mL
OXYCODONE SCRN UR: NEGATIVE ng/mL
PH URINE, INITIAL: 5.7 pH (ref 4.5–8.9)
Propoxyphene: NEGATIVE ng/mL
TRAMADOL UR: NEGATIVE ng/mL
Tapentadol, urine: NEGATIVE ng/mL
Zolpidem, Urine: NEGATIVE ng/mL

## 2014-09-01 LAB — OPIATES/OPIOIDS (LC/MS-MS)
CODEINE URINE: NEGATIVE ng/mL (ref <50)
HYDROCODONE: 2333 ng/mL (ref <50)
HYDROMORPHONE: 316 ng/mL (ref <50)
MORPHINE: NEGATIVE ng/mL (ref <50)
NORHYDROCODONE, UR: 2125 ng/mL (ref <50)
NOROXYCODONE, UR: NEGATIVE ng/mL (ref <50)
Oxycodone, ur: NEGATIVE ng/mL (ref <50)
Oxymorphone: NEGATIVE ng/mL (ref <50)

## 2014-09-06 NOTE — Progress Notes (Signed)
Urine drug screen for this encounter is consistent for prescribed medication 

## 2014-11-13 ENCOUNTER — Encounter: Payer: Self-pay | Admitting: Physical Medicine & Rehabilitation

## 2014-11-13 ENCOUNTER — Ambulatory Visit (HOSPITAL_BASED_OUTPATIENT_CLINIC_OR_DEPARTMENT_OTHER): Payer: Medicare Other | Admitting: Physical Medicine & Rehabilitation

## 2014-11-13 ENCOUNTER — Encounter: Payer: Medicare Other | Attending: Physical Medicine & Rehabilitation

## 2014-11-13 VITALS — BP 114/70 | HR 65 | Resp 14

## 2014-11-13 DIAGNOSIS — G8929 Other chronic pain: Secondary | ICD-10-CM | POA: Diagnosis not present

## 2014-11-13 DIAGNOSIS — M4802 Spinal stenosis, cervical region: Secondary | ICD-10-CM

## 2014-11-13 DIAGNOSIS — M542 Cervicalgia: Secondary | ICD-10-CM | POA: Diagnosis not present

## 2014-11-13 DIAGNOSIS — M961 Postlaminectomy syndrome, not elsewhere classified: Secondary | ICD-10-CM

## 2014-11-13 DIAGNOSIS — M545 Low back pain, unspecified: Secondary | ICD-10-CM | POA: Insufficient documentation

## 2014-11-13 DIAGNOSIS — Z5181 Encounter for therapeutic drug level monitoring: Secondary | ICD-10-CM | POA: Insufficient documentation

## 2014-11-13 DIAGNOSIS — Z79899 Other long term (current) drug therapy: Secondary | ICD-10-CM | POA: Insufficient documentation

## 2014-11-13 MED ORDER — HYDROCODONE-ACETAMINOPHEN 5-325 MG PO TABS: 1.0000 | ORAL_TABLET | Freq: Three times a day (TID) | ORAL | Status: DC | PRN

## 2014-11-13 NOTE — Progress Notes (Signed)
Subjective:    Patient ID: Fred [name], male    DOB: 1951/04/03, 64 y.o.   MRN: [id number] CC:  Low back and neck pain, RIght elbow pain HPI Tried T#3 which wasn't helpful Used his dead sister in law's hydrocodone in interval, which worked better for him Walking 1/2 mile almost every day, not as cosistent last couple months Partial sit ups Pain Inventory Average Pain 7 Pain Right Now 7 My pain is stabbing, tingling and aching  In the last 24 hours, has pain interfered with the following? General activity 6 Relation with others 7 Enjoyment of life 7 What TIME of day is your pain at its worst? daytime, evening Sleep (in general) Fair  Pain is worse with: walking, bending, standing and some activites Pain improves with: rest and medication Relief from Meds: very little  Mobility walk without assistance ability to climb steps?  yes do you drive?  yes  Function disabled: date disabled .  Neuro/Psych tingling depression loss of taste or smell  Prior Studies Any changes since last visit?  no  Physicians involved in your care Any changes since last visit?  no   Family History  Problem Relation Age of Onset  . Stroke Mother   . Heart disease Father   . Stroke Father     heat stroke  . Kidney disease Brother    History   Social History  . Marital Status: Married    Spouse Name: N/A  . Number of Children: N/A  . Years of Education: N/A   Social History Main Topics  . Smoking status: Former Smoker -- 1.00 packs/day for 30 years    Types: Cigarettes    Quit date: 05/25/2001  . Smokeless tobacco: Former Systems developer    Quit date: 09/22/2001  . Alcohol Use: No  . Drug Use: No  . Sexual Activity: Not on file   Other Topics Concern  . None   Social History Narrative   Past Surgical History  Procedure Laterality Date  . Spine surgery      cervical disc removed and stabalized with bone- good mobility  . Brain surgery      inserted rod to connect to  BaHa(Hearing device)  . Eye surgery      lasik  . Robot assisted laparoscopic radical prostatectomy N/A 08/04/2013    Procedure: ROBOTIC ASSISTED LAPAROSCOPIC RADICAL PROSTATECTOMY LEVEL 2;  Surgeon: Dutch Gray, MD;  Location: [facility];  Service: Urology;  Laterality: N/A;  . Lymphadenectomy Bilateral 08/04/2013    Procedure: LYMPHADENECTOMY;  Surgeon: Dutch Gray, MD;  Location: [facility];  Service: Urology;  Laterality: Bilateral;   Past Medical History  Diagnosis Date  . Neuromuscular disorder   . Hypertension   . Hyperlipidemia   . History of kidney stones   . Cancer     prostate  . Hearing loss   . HOH (hard of hearing)   . Depression    BP 114/70 mmHg  Pulse 65  Resp 14  SpO2 99%  Opioid Risk Score:   Fall Risk Score:  `1  Depression screen PHQ 2/9  Depression screen PHQ 2/9 08/28/2014  Decreased Interest 2  Down, Depressed, Hopeless 1  PHQ - 2 Score 3  Altered sleeping 1  Tired, decreased energy 2  Change in appetite 0  Feeling bad or failure about yourself  0  Trouble concentrating 1  Moving slowly or fidgety/restless 0  Suicidal thoughts 0  PHQ-9 Score 7     Review of  Systems  Constitutional:       Weight loss Total loss of smell Slight loss of taste  Neurological:       Tingling  Psychiatric/Behavioral: Positive for dysphoric mood.  All other systems reviewed and are negative.      Objective:   Physical Exam  Constitutional: He is oriented to person, place, and time. He appears well-developed and well-nourished.  HENT:  Head: Normocephalic and atraumatic.  Eyes: Conjunctivae are normal. Pupils are equal, round, and reactive to light.  Musculoskeletal:       Cervical back: He exhibits decreased range of motion and tenderness.  Tender bilateral upper traps  Neurological: He is alert and oriented to person, place, and time.  Psychiatric: He has a normal mood and affect.  Nursing note and vitals reviewed.  Lumbar ROM reduced,Decreased lumbar flexion  and extension as well as lumbar R lateral bending, overall about 50% reduction some tenderness in right flank area, Right-sided lumbar paraspinal tenderness Negative straight leg raising test Motor strength is 5/5 bilateral deltoid, biceps, triceps, grip, hip flexor, knee extensor, ankle dorsi flexor and plantar flexor        Assessment & Plan:  1.Cervical postlaminectomy syndrome with cervical stenosis.  Hedid not have a good result with Tylenol 3. We discussed other treatment options including Tylenol No. 4. He may consider this in the future, However he would like to get back to his usual activity level and feels the hydrocodone enabled him to do that. We'll restart hydrocodone 5 mg 3 times a day, cautioned against taking any other medications from family members even if it's the same medicine.  2. Chronic low back pain suspect lumbar spondylosis as well as chronic myofascial pain, Back exercises printed out for patient he will start doing these. Encourage more walking. There's practitioner follow-up in one month

## 2014-11-13 NOTE — Patient Instructions (Signed)
Low Back Sprain with Rehab  A sprain is an injury in which a ligament is torn. The ligaments of the lower back are vulnerable to sprains. However, they are strong and require great force to be injured. These ligaments are important for stabilizing the spinal column. Sprains are classified into three categories. Grade 1 sprains cause pain, but the tendon is not lengthened. Grade 2 sprains include a lengthened ligament, due to the ligament being stretched or partially ruptured. With grade 2 sprains there is still function, although the function may be decreased. Grade 3 sprains involve a complete tear of the tendon or muscle, and function is usually impaired. SYMPTOMS   Severe pain in the lower back.  Sometimes, a feeling of a "pop," "snap," or tear, at the time of injury.  Tenderness and sometimes swelling at the injury site.  Uncommonly, bruising (contusion) within 48 hours of injury.  Muscle spasms in the back. CAUSES  Low back sprains occur when a force is placed on the ligaments that is greater than they can handle. Common causes of injury include:  Performing a stressful act while off-balance.  Repetitive stressful activities that involve movement of the lower back.  Direct hit (trauma) to the lower back. RISK INCREASES WITH:  Contact sports (football, wrestling).  Collisions (major skiing accidents).  Sports that require throwing or lifting (baseball, weightlifting).  Sports involving twisting of the spine (gymnastics, diving, tennis, golf).  Poor strength and flexibility.  Inadequate protection.  Previous back injury or surgery (especially fusion). PREVENTION  Wear properly fitted and padded protective equipment.  Warm up and stretch properly before activity.  Allow for adequate recovery between workouts.  Maintain physical fitness:  Strength, flexibility, and endurance.  Cardiovascular fitness.  Maintain a healthy body weight. PROGNOSIS  If treated  properly, low back sprains usually heal with non-surgical treatment. The length of time for healing depends on the severity of the injury.  RELATED COMPLICATIONS   Recurring symptoms, resulting in a chronic problem.  Chronic inflammation and pain in the low back.  Delayed healing or resolution of symptoms, especially if activity is resumed too soon.  Prolonged impairment.  Unstable or arthritic joints of the low back. TREATMENT  Treatment first involves the use of ice and medicine, to reduce pain and inflammation. The use of strengthening and stretching exercises may help reduce pain with activity. These exercises may be performed at home or with a therapist. Severe injuries may require referral to a therapist for further evaluation and treatment, such as ultrasound. Your caregiver may advise that you wear a back brace or corset, to help reduce pain and discomfort. Often, prolonged bed rest results in greater harm then benefit. Corticosteroid injections may be recommended. However, these should be reserved for the most serious cases. It is important to avoid using your back when lifting objects. At night, sleep on your back on a firm mattress, with a pillow placed under your knees. If non-surgical treatment is unsuccessful, surgery may be needed.  MEDICATION   If pain medicine is needed, nonsteroidal anti-inflammatory medicines (aspirin and ibuprofen), or other minor pain relievers (acetaminophen), are often advised.  Do not take pain medicine for 7 days before surgery.  Prescription pain relievers may be given, if your caregiver thinks they are needed. Use only as directed and only as much as you need.  Ointments applied to the skin may be helpful.  Corticosteroid injections may be given by your caregiver. These injections should be reserved for the most serious cases,   because they may only be given a certain number of times. HEAT AND COLD  Cold treatment (icing) should be applied for 10  to 15 minutes every 2 to 3 hours for inflammation and pain, and immediately after activity that aggravates your symptoms. Use ice packs or an ice massage.  Heat treatment may be used before performing stretching and strengthening activities prescribed by your caregiver, physical therapist, or athletic trainer. Use a heat pack or a warm water soak. SEEK MEDICAL CARE IF:   Symptoms get worse or do not improve in 2 to 4 weeks, despite treatment.  You develop numbness or weakness in either leg.  You lose bowel or bladder function.  Any of the following occur after surgery: fever, increased pain, swelling, redness, drainage of fluids, or bleeding in the affected area.  New, unexplained symptoms develop. (Drugs used in treatment may produce side effects.) EXERCISES  RANGE OF MOTION (ROM) AND STRETCHING EXERCISES - Low Back Sprain Most people with lower back pain will find that their symptoms get worse with excessive bending forward (flexion) or arching at the lower back (extension). The exercises that will help resolve your symptoms will focus on the opposite motion.  Your physician, physical therapist or athletic trainer will help you determine which exercises will be most helpful to resolve your lower back pain. Do not complete any exercises without first consulting with your caregiver. Discontinue any exercises which make your symptoms worse, until you speak to your caregiver. If you have pain, numbness or tingling which travels down into your buttocks, leg or foot, the goal of the therapy is for these symptoms to move closer to your back and eventually resolve. Sometimes, these leg symptoms will get better, but your lower back pain may worsen. This is often an indication of progress in your rehabilitation. Be very alert to any changes in your symptoms and the activities in which you participated in the 24 hours prior to the change. Sharing this information with your caregiver will allow him or her to  most efficiently treat your condition. These exercises may help you when beginning to rehabilitate your injury. Your symptoms may resolve with or without further involvement from your physician, physical therapist or athletic trainer. While completing these exercises, remember:   Restoring tissue flexibility helps normal motion to return to the joints. This allows healthier, less painful movement and activity.  An effective stretch should be held for at least 30 seconds.  A stretch should never be painful. You should only feel a gentle lengthening or release in the stretched tissue. FLEXION RANGE OF MOTION AND STRETCHING EXERCISES: STRETCH - Flexion, Single Knee to Chest   Lie on a firm bed or floor with both legs extended in front of you.  Keeping one leg in contact with the floor, bring your opposite knee to your chest. Hold your leg in place by either grabbing behind your thigh or at your knee.  Pull until you feel a gentle stretch in your low back. Hold __________ seconds.  Slowly release your grasp and repeat the exercise with the opposite side. Repeat __________ times. Complete this exercise __________ times per day.  STRETCH - Flexion, Double Knee to Chest  Lie on a firm bed or floor with both legs extended in front of you.  Keeping one leg in contact with the floor, bring your opposite knee to your chest.  Tense your stomach muscles to support your back and then lift your other knee to your chest. Hold your legs   in place by either grabbing behind your thighs or at your knees.  Pull both knees toward your chest until you feel a gentle stretch in your low back. Hold __________ seconds.  Tense your stomach muscles and slowly return one leg at a time to the floor. Repeat __________ times. Complete this exercise __________ times per day.  STRETCH - Low Trunk Rotation  Lie on a firm bed or floor. Keeping your legs in front of you, bend your knees so they are both pointed toward the  ceiling and your feet are flat on the floor.  Extend your arms out to the side. This will stabilize your upper body by keeping your shoulders in contact with the floor.  Gently and slowly drop both knees together to one side until you feel a gentle stretch in your low back. Hold for __________ seconds.  Tense your stomach muscles to support your lower back as you bring your knees back to the starting position. Repeat the exercise to the other side. Repeat __________ times. Complete this exercise __________ times per day  EXTENSION RANGE OF MOTION AND FLEXIBILITY EXERCISES: STRETCH - Extension, Prone on Elbows   Lie on your stomach on the floor, a bed will be too soft. Place your palms about shoulder width apart and at the height of your head.  Place your elbows under your shoulders. If this is too painful, stack pillows under your chest.  Allow your body to relax so that your hips drop lower and make contact more completely with the floor.  Hold this position for __________ seconds.  Slowly return to lying flat on the floor. Repeat __________ times. Complete this exercise __________ times per day.  RANGE OF MOTION - Extension, Prone Press Ups  Lie on your stomach on the floor, a bed will be too soft. Place your palms about shoulder width apart and at the height of your head.  Keeping your back as relaxed as possible, slowly straighten your elbows while keeping your hips on the floor. You may adjust the placement of your hands to maximize your comfort. As you gain motion, your hands will come more underneath your shoulders.  Hold this position __________ seconds.  Slowly return to lying flat on the floor. Repeat __________ times. Complete this exercise __________ times per day.  RANGE OF MOTION- Quadruped, Neutral Spine   Assume a hands and knees position on a firm surface. Keep your hands under your shoulders and your knees under your hips. You may place padding under your knees for  comfort.  Drop your head and point your tailbone toward the ground below you. This will round out your lower back like an angry cat. Hold this position for __________ seconds.  Slowly lift your head and release your tail bone so that your back sags into a large arch, like an old horse.  Hold this position for __________ seconds.  Repeat this until you feel limber in your low back.  Now, find your "sweet spot." This will be the most comfortable position somewhere between the two previous positions. This is your neutral spine. Once you have found this position, tense your stomach muscles to support your low back.  Hold this position for __________ seconds. Repeat __________ times. Complete this exercise __________ times per day.  STRENGTHENING EXERCISES - Low Back Sprain These exercises may help you when beginning to rehabilitate your injury. These exercises should be done near your "sweet spot." This is the neutral, low-back arch, somewhere between fully rounded   and fully arched, that is your least painful position. When performed in this safe range of motion, these exercises can be used for people who have either a flexion or extension based injury. These exercises may resolve your symptoms with or without further involvement from your physician, physical therapist or athletic trainer. While completing these exercises, remember:   Muscles can gain both the endurance and the strength needed for everyday activities through controlled exercises.  Complete these exercises as instructed by your physician, physical therapist or athletic trainer. Increase the resistance and repetitions only as guided.  You may experience muscle soreness or fatigue, but the pain or discomfort you are trying to eliminate should never worsen during these exercises. If this pain does worsen, stop and make certain you are following the directions exactly. If the pain is still present after adjustments, discontinue the  exercise until you can discuss the trouble with your caregiver. STRENGTHENING - Deep Abdominals, Pelvic Tilt   Lie on a firm bed or floor. Keeping your legs in front of you, bend your knees so they are both pointed toward the ceiling and your feet are flat on the floor.  Tense your lower abdominal muscles to press your low back into the floor. This motion will rotate your pelvis so that your tail bone is scooping upwards rather than pointing at your feet or into the floor. With a gentle tension and even breathing, hold this position for __________ seconds. Repeat __________ times. Complete this exercise __________ times per day.  STRENGTHENING - Abdominals, Crunches   Lie on a firm bed or floor. Keeping your legs in front of you, bend your knees so they are both pointed toward the ceiling and your feet are flat on the floor. Cross your arms over your chest.  Slightly tip your chin down without bending your neck.  Tense your abdominals and slowly lift your trunk high enough to just clear your shoulder blades. Lifting higher can put excessive stress on the lower back and does not further strengthen your abdominal muscles.  Control your return to the starting position. Repeat __________ times. Complete this exercise __________ times per day.  STRENGTHENING - Quadruped, Opposite UE/LE Lift   Assume a hands and knees position on a firm surface. Keep your hands under your shoulders and your knees under your hips. You may place padding under your knees for comfort.  Find your neutral spine and gently tense your abdominal muscles so that you can maintain this position. Your shoulders and hips should form a rectangle that is parallel with the floor and is not twisted.  Keeping your trunk steady, lift your right hand no higher than your shoulder and then your left leg no higher than your hip. Make sure you are not holding your breath. Hold this position for __________ seconds.  Continuing to keep  your abdominal muscles tense and your back steady, slowly return to your starting position. Repeat with the opposite arm and leg. Repeat __________ times. Complete this exercise __________ times per day.  STRENGTHENING - Abdominals and Quadriceps, Straight Leg Raise   Lie on a firm bed or floor with both legs extended in front of you.  Keeping one leg in contact with the floor, bend the other knee so that your foot can rest flat on the floor.  Find your neutral spine, and tense your abdominal muscles to maintain your spinal position throughout the exercise.  Slowly lift your straight leg off the floor about 6 inches for a count   of 15, making sure to not hold your breath.  Still keeping your neutral spine, slowly lower your leg all the way to the floor. Repeat this exercise with each leg __________ times. Complete this exercise __________ times per day. POSTURE AND BODY MECHANICS CONSIDERATIONS - Low Back Sprain Keeping correct posture when sitting, standing or completing your activities will reduce the stress put on different body tissues, allowing injured tissues a chance to heal and limiting painful experiences. The following are general guidelines for improved posture. Your physician or physical therapist will provide you with any instructions specific to your needs. While reading these guidelines, remember:  The exercises prescribed by your provider will help you have the flexibility and strength to maintain correct postures.  The correct posture provides the best environment for your joints to work. All of your joints have less wear and tear when properly supported by a spine with good posture. This means you will experience a healthier, less painful body.  Correct posture must be practiced with all of your activities, especially prolonged sitting and standing. Correct posture is as important when doing repetitive low-stress activities (typing) as it is when doing a single heavy-load  activity (lifting). RESTING POSITIONS Consider which positions are most painful for you when choosing a resting position. If you have pain with flexion-based activities (sitting, bending, stooping, squatting), choose a position that allows you to rest in a less flexed posture. You would want to avoid curling into a fetal position on your side. If your pain worsens with extension-based activities (prolonged standing, working overhead), avoid resting in an extended position such as sleeping on your stomach. Most people will find more comfort when they rest with their spine in a more neutral position, neither too rounded nor too arched. Lying on a non-sagging bed on your side with a pillow between your knees, or on your back with a pillow under your knees will often provide some relief. Keep in mind, being in any one position for a prolonged period of time, no matter how correct your posture, can still lead to stiffness. PROPER SITTING POSTURE In order to minimize stress and discomfort on your spine, you must sit with correct posture. Sitting with good posture should be effortless for a healthy body. Returning to good posture is a gradual process. Many people can work toward this most comfortably by using various supports until they have the flexibility and strength to maintain this posture on their own. When sitting with proper posture, your ears will fall over your shoulders and your shoulders will fall over your hips. You should use the back of the chair to support your upper back. Your lower back will be in a neutral position, just slightly arched. You may place a small pillow or folded towel at the base of your lower back for  support.  When working at a desk, create an environment that supports good, upright posture. Without extra support, muscles tire, which leads to excessive strain on joints and other tissues. Keep these recommendations in mind: CHAIR:  A chair should be able to slide under your desk  when your back makes contact with the back of the chair. This allows you to work closely.  The chair's height should allow your eyes to be level with the upper part of your monitor and your hands to be slightly lower than your elbows. BODY POSITION  Your feet should make contact with the floor. If this is not possible, use a foot rest.  Keep your   ears over your shoulders. This will reduce stress on your neck and low back. INCORRECT SITTING POSTURES  If you are feeling tired and unable to assume a healthy sitting posture, do not slouch or slump. This puts excessive strain on your back tissues, causing more damage and pain. Healthier options include:  Using more support, like a lumbar pillow.  Switching tasks to something that requires you to be upright or walking.  Talking a brief walk.  Lying down to rest in a neutral-spine position. PROLONGED STANDING WHILE SLIGHTLY LEANING FORWARD  When completing a task that requires you to lean forward while standing in one place for a long time, place either foot up on a stationary 2-4 inch high object to help maintain the best posture. When both feet are on the ground, the lower back tends to lose its slight inward curve. If this curve flattens (or becomes too large), then the back and your other joints will experience too much stress, tire more quickly, and can cause pain. CORRECT STANDING POSTURES Proper standing posture should be assumed with all daily activities, even if they only take a few moments, like when brushing your teeth. As in sitting, your ears should fall over your shoulders and your shoulders should fall over your hips. You should keep a slight tension in your abdominal muscles to brace your spine. Your tailbone should point down to the ground, not behind your body, resulting in an over-extended swayback posture.  INCORRECT STANDING POSTURES  Common incorrect standing postures include a forward head, locked knees and/or an excessive  swayback. WALKING Walk with an upright posture. Your ears, shoulders and hips should all line-up. PROLONGED ACTIVITY IN A FLEXED POSITION When completing a task that requires you to bend forward at your waist or lean over a low surface, try to find a way to stabilize 3 out of 4 of your limbs. You can place a hand or elbow on your thigh or rest a knee on the surface you are reaching across. This will provide you more stability, so that your muscles do not tire as quickly. By keeping your knees relaxed, or slightly bent, you will also reduce stress across your lower back. CORRECT LIFTING TECHNIQUES DO :  Assume a wide stance. This will provide you more stability and the opportunity to get as close as possible to the object which you are lifting.  Tense your abdominals to brace your spine. Bend at the knees and hips. Keeping your back locked in a neutral-spine position, lift using your leg muscles. Lift with your legs, keeping your back straight.  Test the weight of unknown objects before attempting to lift them.  Try to keep your elbows locked down at your sides in order get the best strength from your shoulders when carrying an object.  Always ask for help when lifting heavy or awkward objects. INCORRECT LIFTING TECHNIQUES DO NOT:   Lock your knees when lifting, even if it is a small object.  Bend and twist. Pivot at your feet or move your feet when needing to change directions.  Assume that you can safely pick up even a paperclip without proper posture. Document Released: 05/05/2005 Document Revised: 07/28/2011 Document Reviewed: 08/17/2008 [employer] Patient Information 2015 [employer]. This information is not intended to replace advice given to you by your health care provider. Make sure you discuss any questions you have with your health care provider.  

## 2014-11-21 ENCOUNTER — Ambulatory Visit: Payer: Medicare Other | Admitting: Physical Medicine & Rehabilitation

## 2014-12-12 ENCOUNTER — Encounter: Payer: Self-pay | Admitting: Registered Nurse

## 2014-12-12 ENCOUNTER — Encounter: Payer: Medicare Other | Attending: Physical Medicine & Rehabilitation | Admitting: Registered Nurse

## 2014-12-12 VITALS — BP 128/82 | HR 60 | Resp 14

## 2014-12-12 DIAGNOSIS — M4802 Spinal stenosis, cervical region: Secondary | ICD-10-CM | POA: Diagnosis not present

## 2014-12-12 DIAGNOSIS — G894 Chronic pain syndrome: Secondary | ICD-10-CM

## 2014-12-12 DIAGNOSIS — M961 Postlaminectomy syndrome, not elsewhere classified: Secondary | ICD-10-CM | POA: Insufficient documentation

## 2014-12-12 DIAGNOSIS — G8929 Other chronic pain: Secondary | ICD-10-CM | POA: Diagnosis not present

## 2014-12-12 DIAGNOSIS — M542 Cervicalgia: Secondary | ICD-10-CM | POA: Diagnosis not present

## 2014-12-12 DIAGNOSIS — M545 Low back pain: Secondary | ICD-10-CM | POA: Diagnosis present

## 2014-12-12 DIAGNOSIS — Z79899 Other long term (current) drug therapy: Secondary | ICD-10-CM

## 2014-12-12 DIAGNOSIS — Z5181 Encounter for therapeutic drug level monitoring: Secondary | ICD-10-CM | POA: Diagnosis not present

## 2014-12-12 MED ORDER — HYDROCODONE-ACETAMINOPHEN 5-325 MG PO TABS: 1.0000 | ORAL_TABLET | Freq: Three times a day (TID) | ORAL | Status: DC | PRN

## 2014-12-12 NOTE — Progress Notes (Signed)
Subjective:    Patient ID: Fred [name], male    DOB: 11/30/1950, 64 y.o.   MRN: [id number]  HPI: [name] is a 64 year old male who returns for follow up for chronic pain and medication refill. He says his pain is located in his neck and upper-lower back. He rates his pain 5. His current exercise regime is walking and performing stretching exercises.   Pain Inventory Average Pain 6 Pain Right Now 5 My pain is constant, sharp, stabbing, tingling and aching  In the last 24 hours, has pain interfered with the following? General activity 7 Relation with others 5 Enjoyment of life 6 What TIME of day is your pain at its worst? daytime and evening Sleep (in general) Fair  Pain is worse with: bending, sitting and standing Pain improves with: medication Relief from Meds: 5  Mobility walk without assistance how many minutes can you walk? 15 ability to climb steps?  yes do you drive?  yes  Function disabled: date disabled 2001  Neuro/Psych numbness tingling anxiety loss of taste or smell  Prior Studies Any changes since last visit?  no  Physicians involved in your care Any changes since last visit?  no   Family History  Problem Relation Age of Onset  . Stroke Mother   . Heart disease Father   . Stroke Father     heat stroke  . Kidney disease Brother    History   Social History  . Marital Status: Married    Spouse Name: N/A  . Number of Children: N/A  . Years of Education: N/A   Social History Main Topics  . Smoking status: Former Smoker -- 1.00 packs/day for 30 years    Types: Cigarettes    Quit date: 05/25/2001  . Smokeless tobacco: Former Systems developer    Quit date: 09/22/2001  . Alcohol Use: No  . Drug Use: No  . Sexual Activity: Not on file   Other Topics Concern  . None   Social History Narrative   Past Surgical History  Procedure Laterality Date  . Spine surgery      cervical disc removed and stabalized with bone- good mobility  .  Brain surgery      inserted rod to connect to BaHa(Hearing device)  . Eye surgery      lasik  . Robot assisted laparoscopic radical prostatectomy N/A 08/04/2013    Procedure: ROBOTIC ASSISTED LAPAROSCOPIC RADICAL PROSTATECTOMY LEVEL 2;  Surgeon: Dutch Gray, MD;  Location: [facility];  Service: Urology;  Laterality: N/A;  . Lymphadenectomy Bilateral 08/04/2013    Procedure: LYMPHADENECTOMY;  Surgeon: Dutch Gray, MD;  Location: [facility];  Service: Urology;  Laterality: Bilateral;   Past Medical History  Diagnosis Date  . Neuromuscular disorder   . Hypertension   . Hyperlipidemia   . History of kidney stones   . Cancer     prostate  . Hearing loss   . HOH (hard of hearing)   . Depression    BP 128/82 mmHg  Pulse 60  Resp 14  SpO2 100%  Opioid Risk Score:   Fall Risk Score:  `1  Depression screen PHQ 2/9  Depression screen [facility] 2/9 12/12/2014 08/28/2014  Decreased Interest 0 2  Down, Depressed, Hopeless 0 1  PHQ - 2 Score 0 3  Altered sleeping - 1  Tired, decreased energy - 2  Change in appetite - 0  Feeling bad or failure about yourself  - 0  Trouble concentrating -  1  Moving slowly or fidgety/restless - 0  Suicidal thoughts - 0  PHQ-9 Score - 7     Review of Systems  Constitutional: Negative.   HENT: Negative.   Eyes: Negative.   Respiratory: Negative.   Cardiovascular: Negative.   Gastrointestinal: Negative.   Endocrine: Negative.   Genitourinary: Negative.   Musculoskeletal: Positive for myalgias, back pain and arthralgias.       Bilateral neuropathy hands and feet  Skin: Negative.   Allergic/Immunologic: Negative.   Neurological: Positive for numbness.       Tingling  Hematological: Negative.   Psychiatric/Behavioral: The patient is nervous/anxious.        Objective:   Physical Exam  Constitutional: He is oriented to person, place, and time. He appears well-developed and well-nourished.  HENT:  Head: Normocephalic and atraumatic.  Neck: Normal range of  motion. Neck supple.  Cervical Paraspinal Tenderness: C-5- C-6  Cardiovascular: Normal rate and regular rhythm.   Pulmonary/Chest: Effort normal and breath sounds normal.  Musculoskeletal:  Normal Muscle Bulk and Muscle Testing Reveals: Upper Extremities: Full ROM and Muscle Strength 5/5 Thoracic Paraspinal Tenderness: T-1- T-3 Lumbar Paraspinal Tenderness: L-3- L-5 Lower Extremities: Full ROM and Muscle Strength 5/5 Arises from chair with ease Narrow Based Gait  Neurological: He is alert and oriented to person, place, and time.  Skin: Skin is warm and dry.  Psychiatric: He has a normal mood and affect.  Nursing note and vitals reviewed.         Assessment & Plan:  1. L2 compression fracture:  Refilled:Hydrocodone 5/325mg  one tablet every 8 hours as needed for moderate pain. #90 2. Cervical post lami syndrome: Continue current medication regime. Also encouraged to continue with exercise and heat therapy.  15 minutes of face to face patient care time was spent during this visit. All questions were encouraged and answered.   F/U in 1 month

## 2015-01-10 ENCOUNTER — Encounter: Payer: Self-pay | Admitting: Registered Nurse

## 2015-01-10 ENCOUNTER — Other Ambulatory Visit: Payer: Self-pay | Admitting: Physical Medicine & Rehabilitation

## 2015-01-10 ENCOUNTER — Encounter: Payer: Medicare Other | Attending: Physical Medicine & Rehabilitation | Admitting: Registered Nurse

## 2015-01-10 VITALS — BP 137/92 | HR 60 | Resp 14

## 2015-01-10 DIAGNOSIS — M545 Low back pain: Secondary | ICD-10-CM

## 2015-01-10 DIAGNOSIS — M4802 Spinal stenosis, cervical region: Secondary | ICD-10-CM

## 2015-01-10 DIAGNOSIS — M542 Cervicalgia: Secondary | ICD-10-CM | POA: Insufficient documentation

## 2015-01-10 DIAGNOSIS — G8929 Other chronic pain: Secondary | ICD-10-CM

## 2015-01-10 DIAGNOSIS — Z79899 Other long term (current) drug therapy: Secondary | ICD-10-CM | POA: Diagnosis not present

## 2015-01-10 DIAGNOSIS — Z5181 Encounter for therapeutic drug level monitoring: Secondary | ICD-10-CM | POA: Diagnosis not present

## 2015-01-10 DIAGNOSIS — M961 Postlaminectomy syndrome, not elsewhere classified: Secondary | ICD-10-CM | POA: Insufficient documentation

## 2015-01-10 DIAGNOSIS — G894 Chronic pain syndrome: Secondary | ICD-10-CM | POA: Diagnosis not present

## 2015-01-10 MED ORDER — HYDROCODONE-ACETAMINOPHEN 5-325 MG PO TABS: 1.0000 | ORAL_TABLET | Freq: Three times a day (TID) | ORAL | Status: DC | PRN

## 2015-01-10 NOTE — Progress Notes (Signed)
Subjective:    Patient ID: Fred [name], male    DOB: 1950-11-14, 64 y.o.   MRN: [id number]  HPI: Fred [name] is a 64 year old male who returns for follow up for chronic pain and medication refill. He says his pain is located in his neck and lower back. He rates his pain 4 His current exercise regime is walking and performing stretching exercises.    Pain Inventory Average Pain 6 Pain Right Now 4 My pain is sharp, stabbing and tingling  In the last 24 hours, has pain interfered with the following? General activity 3 Relation with others 3 Enjoyment of life 3 What TIME of day is your pain at its worst? daytime, evening Sleep (in general) Fair  Pain is worse with: sitting and standing Pain improves with: rest and medication Relief from Meds: 2  Mobility walk without assistance ability to climb steps?  yes do you drive?  yes  Function disabled: date disabled .  Neuro/Psych spasms loss of taste or smell  Prior Studies Any changes since last visit?  no  Physicians involved in your care Any changes since last visit?  no   Family History  Problem Relation Age of Onset  . Stroke Mother   . Heart disease Father   . Stroke Father     heat stroke  . Kidney disease Brother    Social History   Social History  . Marital Status: Married    Spouse Name: N/A  . Number of Children: N/A  . Years of Education: N/A   Social History Main Topics  . Smoking status: Former Smoker -- 1.00 packs/day for 30 years    Types: Cigarettes    Quit date: 05/25/2001  . Smokeless tobacco: Former Systems developer    Quit date: 09/22/2001  . Alcohol Use: No  . Drug Use: No  . Sexual Activity: Not Asked   Other Topics Concern  . None   Social History Narrative   Past Surgical History  Procedure Laterality Date  . Spine surgery      cervical disc removed and stabalized with bone- good mobility  . Brain surgery      inserted rod to connect to BaHa(Hearing device)  . Eye  surgery      lasik  . Robot assisted laparoscopic radical prostatectomy N/A 08/04/2013    Procedure: ROBOTIC ASSISTED LAPAROSCOPIC RADICAL PROSTATECTOMY LEVEL 2;  Surgeon: Dutch Gray, MD;  Location: [facility];  Service: Urology;  Laterality: N/A;  . Lymphadenectomy Bilateral 08/04/2013    Procedure: LYMPHADENECTOMY;  Surgeon: Dutch Gray, MD;  Location: [facility];  Service: Urology;  Laterality: Bilateral;   Past Medical History  Diagnosis Date  . Neuromuscular disorder   . Hypertension   . Hyperlipidemia   . History of kidney stones   . Cancer     prostate  . Hearing loss   . HOH (hard of hearing)   . Depression    BP 147/92 mmHg  Pulse 57  Resp 14  SpO2 96%  Opioid Risk Score:   Fall Risk Score:  `1  Depression screen PHQ 2/9  Depression screen [facility] 2/9 12/12/2014 08/28/2014  Decreased Interest 0 2  Down, Depressed, Hopeless 0 1  PHQ - 2 Score 0 3  Altered sleeping - 1  Tired, decreased energy - 2  Change in appetite - 0  Feeling bad or failure about yourself  - 0  Trouble concentrating - 1  Moving slowly or fidgety/restless - 0  Suicidal thoughts - 0  PHQ-9 Score - 7     Review of Systems  Constitutional:       Loss of smell  Neurological:       Spasms   All other systems reviewed and are negative.      Objective:   Physical Exam  Constitutional: He is oriented to person, place, and time. He appears well-developed and well-nourished.  HENT:  Head: Normocephalic and atraumatic.  Neck: Normal range of motion. Neck supple.  Cardiovascular: Normal rate and regular rhythm.   Pulmonary/Chest: Effort normal and breath sounds normal.  Musculoskeletal:  Normal Muscle Bulk and Muscle Testing Reveals: Upper Extremities: Full ROM and Muscle Strength 5/5 Bilateral AC Joint Tenderness Thoracic Paraspinal Tenderness: T-1- T-3 Lumbar Paraspinal Tenderness: L-3- _L-4 Lower Extremities: Full ROM and Muscle Strength 5/5 Arises from chair with ease Narrow Based gait    Neurological: He is alert and oriented to person, place, and time.  Skin: Skin is warm and dry.  Psychiatric: He has a normal mood and affect.  Nursing note and vitals reviewed.         Assessment & Plan:  1. L2 compression fracture:  Refilled:Hydrocodone 5/325mg  one tablet every 8 hours as needed for moderate pain. #90 2. Cervical post lami syndrome: Continue current medication regime. Also encouraged to continue with exercise and heat therapy.  15 minutes of face to face patient care time was spent during this visit. All questions were encouraged and answered.   F/U in 1 month

## 2015-01-10 NOTE — Addendum Note (Signed)
Addended by: Caro Hight on: 01/10/2015 09:04 AM   Modules accepted: Orders

## 2015-01-11 LAB — PMP ALCOHOL METABOLITE (ETG): Ethyl Glucuronide (EtG): NEGATIVE ng/mL

## 2015-01-15 LAB — OPIATES/OPIOIDS (LC/MS-MS)
Codeine Urine: NEGATIVE ng/mL (ref <50)
HYDROCODONE: 1253 ng/mL (ref <50)
HYDROMORPHONE: 101 ng/mL (ref <50)
Morphine Urine: NEGATIVE ng/mL (ref <50)
NOROXYCODONE, UR: NEGATIVE ng/mL (ref <50)
Norhydrocodone, Ur: 628 ng/mL (ref <50)
Oxycodone, ur: NEGATIVE ng/mL (ref <50)
Oxymorphone: NEGATIVE ng/mL (ref <50)

## 2015-01-16 LAB — PRESCRIPTION MONITORING PROFILE (SOLSTAS)
AMPHETAMINE/METH: NEGATIVE ng/mL
Barbiturate Screen, Urine: NEGATIVE ng/mL
Benzodiazepine Screen, Urine: NEGATIVE ng/mL
Buprenorphine, Urine: NEGATIVE ng/mL
CARISOPRODOL, URINE: NEGATIVE ng/mL
CREATININE, URINE: 173.08 mg/dL (ref >20.0)
Cannabinoid Scrn, Ur: NEGATIVE ng/mL
Cocaine Metabolites: NEGATIVE ng/mL
ECSTASY: NEGATIVE ng/mL
Fentanyl, Ur: NEGATIVE ng/mL
METHADONE SCREEN, URINE: NEGATIVE ng/mL
Meperidine, Ur: NEGATIVE ng/mL
Nitrites, Initial: NEGATIVE ug/mL
Oxycodone Screen, Ur: NEGATIVE ng/mL
Propoxyphene: NEGATIVE ng/mL
TRAMADOL UR: NEGATIVE ng/mL
Tapentadol, urine: NEGATIVE ng/mL
Zolpidem, Urine: NEGATIVE ng/mL
pH, Initial: 5.1 pH (ref 4.5–8.9)

## 2015-01-26 NOTE — Progress Notes (Signed)
Urine drug screen for this encounter is consistent for prescribed medication 

## 2015-02-07 ENCOUNTER — Ambulatory Visit: Payer: Medicare Other | Admitting: Registered Nurse

## 2015-02-12 ENCOUNTER — Encounter: Payer: Self-pay | Admitting: Registered Nurse

## 2015-02-12 ENCOUNTER — Encounter: Payer: Medicare Other | Attending: Physical Medicine & Rehabilitation | Admitting: Registered Nurse

## 2015-02-12 VITALS — BP 121/82 | HR 62

## 2015-02-12 DIAGNOSIS — G8929 Other chronic pain: Secondary | ICD-10-CM | POA: Diagnosis not present

## 2015-02-12 DIAGNOSIS — Z5181 Encounter for therapeutic drug level monitoring: Secondary | ICD-10-CM | POA: Diagnosis not present

## 2015-02-12 DIAGNOSIS — G894 Chronic pain syndrome: Secondary | ICD-10-CM | POA: Diagnosis not present

## 2015-02-12 DIAGNOSIS — M961 Postlaminectomy syndrome, not elsewhere classified: Secondary | ICD-10-CM | POA: Insufficient documentation

## 2015-02-12 DIAGNOSIS — M62830 Muscle spasm of back: Secondary | ICD-10-CM

## 2015-02-12 DIAGNOSIS — M545 Low back pain: Secondary | ICD-10-CM | POA: Diagnosis present

## 2015-02-12 DIAGNOSIS — Z79899 Other long term (current) drug therapy: Secondary | ICD-10-CM | POA: Diagnosis not present

## 2015-02-12 DIAGNOSIS — M542 Cervicalgia: Secondary | ICD-10-CM | POA: Diagnosis not present

## 2015-02-12 DIAGNOSIS — M4802 Spinal stenosis, cervical region: Secondary | ICD-10-CM | POA: Diagnosis not present

## 2015-02-12 MED ORDER — HYDROCODONE-ACETAMINOPHEN 5-325 MG PO TABS: 1.0000 | ORAL_TABLET | Freq: Three times a day (TID) | ORAL | Status: DC | PRN

## 2015-02-12 MED ORDER — TIZANIDINE HCL 2 MG PO TABS: 2.0000 mg | ORAL_TABLET | Freq: Three times a day (TID) | ORAL | Status: DC | PRN

## 2015-02-12 NOTE — Progress Notes (Signed)
Subjective:    Patient ID: Fred [name], male    DOB: 12-09-1950, 64 y.o.   MRN: [id number]  HPI: [name] is a 64 year old male who returns for follow up for chronic pain and medication refill. He says his pain is located in his neck, bilateral shoulder's and lower back. Also states he's having muscle spasm's in his lower back.  He rates his pain 3. His current exercise regime is walking and performing stretching exercises.  [name] and his wife will be leaving in October for [place] to care for his mother-in law. His next scheduled appointment will be in November with Dr. Letta Pate he verbalizes understanding.  Pain Inventory Average Pain 4 Pain Right Now 3 My pain is sharp, tingling and aching  In the last 24 hours, has pain interfered with the following? General activity 5 Relation with others 4 Enjoyment of life 5 What TIME of day is your pain at its worst? daytime, evening Sleep (in general) Fair  Pain is worse with: walking, bending, sitting and standing Pain improves with: medication Relief from Meds: 4  Mobility ability to climb steps?  yes do you drive?  yes Do you have any goals in this area?  yes  Function not employed: date last employed 2001 disabled: date disabled 2001  Neuro/Psych bladder control problems spasms loss of taste or smell  Prior Studies Any changes since last visit?  no  Physicians involved in your care Any changes since last visit?  no   Family History  Problem Relation Age of Onset  . Stroke Mother   . Heart disease Father   . Stroke Father     heat stroke  . Kidney disease Brother    Social History   Social History  . Marital Status: Married    Spouse Name: N/A  . Number of Children: N/A  . Years of Education: N/A   Social History Main Topics  . Smoking status: Former Smoker -- 1.00 packs/day for 30 years    Types: Cigarettes    Quit date: 05/25/2001  . Smokeless tobacco: Former Systems developer    Quit date:  09/22/2001  . Alcohol Use: No  . Drug Use: No  . Sexual Activity: Not Asked   Other Topics Concern  . None   Social History Narrative   Past Surgical History  Procedure Laterality Date  . Spine surgery      cervical disc removed and stabalized with bone- good mobility  . Brain surgery      inserted rod to connect to BaHa(Hearing device)  . Eye surgery      lasik  . Robot assisted laparoscopic radical prostatectomy N/A 08/04/2013    Procedure: ROBOTIC ASSISTED LAPAROSCOPIC RADICAL PROSTATECTOMY LEVEL 2;  Surgeon: Dutch Gray, MD;  Location: [facility];  Service: Urology;  Laterality: N/A;  . Lymphadenectomy Bilateral 08/04/2013    Procedure: LYMPHADENECTOMY;  Surgeon: Dutch Gray, MD;  Location: [facility];  Service: Urology;  Laterality: Bilateral;   Past Medical History  Diagnosis Date  . Neuromuscular disorder   . Hypertension   . Hyperlipidemia   . History of kidney stones   . Cancer     prostate  . Hearing loss   . HOH (hard of hearing)   . Depression    BP 121/82 mmHg  Pulse 62  SpO2 98%  Opioid Risk Score:   Fall Risk Score:  `1  Depression screen PHQ 2/9  Depression screen [facility] 2/9 12/12/2014 08/28/2014  Decreased Interest 0 2  Down, Depressed, Hopeless 0 1  PHQ - 2 Score 0 3  Altered sleeping - 1  Tired, decreased energy - 2  Change in appetite - 0  Feeling bad or failure about yourself  - 0  Trouble concentrating - 1  Moving slowly or fidgety/restless - 0  Suicidal thoughts - 0  PHQ-9 Score - 7     Review of Systems  Constitutional:       [place] smell  Genitourinary: Positive for dysuria.  Neurological:       Spasms   All other systems reviewed and are negative.      Objective:   Physical Exam  Constitutional: He is oriented to person, place, and time. He appears well-developed and well-nourished.  HENT:  Head: Normocephalic and atraumatic.  Neck: Normal range of motion. Neck supple.  Cervical Paraspinal Tenderness: C-5- C-6  Cardiovascular:  Normal rate and regular rhythm.   Pulmonary/Chest: Effort normal and breath sounds normal.  Musculoskeletal:  Normal Muscle Bulk and Muscle Testing Reveals: Upper Extremities: Full ROM and Muscle Strength 5/5 Bilateral AC Joint Tenderness Thoracic Paraspinal Tenderness: T-1-T-3 Lower Extremities: Full ROM and Muscle Strength 5/5 Arises from chair with ease Narrow Based Gait  Neurological: He is alert and oriented to person, place, and time.  Skin: Skin is warm and dry.  Psychiatric: He has a normal mood and affect.  Nursing note and vitals reviewed.         Assessment & Plan:  1. L2 compression fracture:  Refilled:Hydrocodone 5/325mg  one tablet every 8 hours as needed for moderate pain. #90 2. Cervical post lami syndrome: Continue current medication regime. Also encouraged to continue with exercise and heat therapy.  3. Muscle Spasm: RX: Tizanidine  15 minutes of face to face patient care time was spent during this visit. All questions were encouraged and answered.   F/U in 1 month

## 2015-03-26 ENCOUNTER — Ambulatory Visit (HOSPITAL_BASED_OUTPATIENT_CLINIC_OR_DEPARTMENT_OTHER): Payer: Medicare Other | Admitting: Physical Medicine & Rehabilitation

## 2015-03-26 ENCOUNTER — Encounter: Payer: Self-pay | Admitting: Physical Medicine & Rehabilitation

## 2015-03-26 ENCOUNTER — Encounter: Payer: Medicare Other | Attending: Physical Medicine & Rehabilitation

## 2015-03-26 VITALS — BP 139/92 | HR 55

## 2015-03-26 DIAGNOSIS — M961 Postlaminectomy syndrome, not elsewhere classified: Secondary | ICD-10-CM | POA: Diagnosis not present

## 2015-03-26 DIAGNOSIS — M4802 Spinal stenosis, cervical region: Secondary | ICD-10-CM

## 2015-03-26 DIAGNOSIS — Z79899 Other long term (current) drug therapy: Secondary | ICD-10-CM | POA: Diagnosis not present

## 2015-03-26 DIAGNOSIS — G8929 Other chronic pain: Secondary | ICD-10-CM | POA: Insufficient documentation

## 2015-03-26 DIAGNOSIS — M542 Cervicalgia: Secondary | ICD-10-CM | POA: Insufficient documentation

## 2015-03-26 DIAGNOSIS — Z5181 Encounter for therapeutic drug level monitoring: Secondary | ICD-10-CM | POA: Insufficient documentation

## 2015-03-26 DIAGNOSIS — M545 Low back pain: Secondary | ICD-10-CM | POA: Insufficient documentation

## 2015-03-26 MED ORDER — ACETAMINOPHEN-CODEINE #4 300-60 MG PO TABS: 1.0000 | ORAL_TABLET | Freq: Three times a day (TID) | ORAL | Status: DC | PRN

## 2015-03-26 NOTE — Progress Notes (Signed)
Subjective:    Patient ID: Fred [name], male    DOB: 01/12/1951, 64 y.o.   MRN: [id number]  HPI Increasing pain all over. 64 year old male with cervical post laminectomy syndrome with remote cervical fusion. No upper extremity weakness or numbness reported. His low back is hurting but does not have any radiating pain down his lower extremity. No new bowel or bladder problems. He is independent with all his self-care and mobility he does not require an assistive device.  Pain Inventory Average Pain 4 Pain Right Now 5 My pain is sharp, stabbing and tingling  In the last 24 hours, has pain interfered with the following? General activity 5 Relation with others 4 Enjoyment of life 4 What TIME of day is your pain at its worst? Daytime and Evening Sleep (in general) Poor  Pain is worse with: walking, sitting and standing Pain improves with: NA Relief from Meds: NA  Mobility walk without assistance ability to climb steps?  yes do you drive?  yes  Function disabled: date disabled 2001  Neuro/Psych No problems in this area  Prior Studies Any changes since last visit?  no  Physicians involved in your care Any changes since last visit?  no   Family History  Problem Relation Age of Onset  . Stroke Mother   . Heart disease Father   . Stroke Father     heat stroke  . Kidney disease Brother    Social History   Social History  . Marital Status: Married    Spouse Name: N/A  . Number of Children: N/A  . Years of Education: N/A   Social History Main Topics  . Smoking status: Former Smoker -- 1.00 packs/day for 30 years    Types: Cigarettes    Quit date: 05/25/2001  . Smokeless tobacco: Former Systems developer    Quit date: 09/22/2001  . Alcohol Use: No  . Drug Use: No  . Sexual Activity: Not Asked   Other Topics Concern  . None   Social History Narrative   Past Surgical History  Procedure Laterality Date  . Spine surgery      cervical disc removed and stabalized  with bone- good mobility  . Brain surgery      inserted rod to connect to BaHa(Hearing device)  . Eye surgery      lasik  . Robot assisted laparoscopic radical prostatectomy N/A 08/04/2013    Procedure: ROBOTIC ASSISTED LAPAROSCOPIC RADICAL PROSTATECTOMY LEVEL 2;  Surgeon: Dutch Gray, MD;  Location: [facility];  Service: Urology;  Laterality: N/A;  . Lymphadenectomy Bilateral 08/04/2013    Procedure: LYMPHADENECTOMY;  Surgeon: Dutch Gray, MD;  Location: [facility];  Service: Urology;  Laterality: Bilateral;   Past Medical History  Diagnosis Date  . Neuromuscular disorder ([place])   . Hypertension   . Hyperlipidemia   . History of kidney stones   . Cancer [facility])     prostate  . Hearing loss   . HOH (hard of hearing)   . Depression    BP 139/92 mmHg  Pulse 55  SpO2 100%  Opioid Risk Score:   Fall Risk Score:  `1  Depression screen PHQ 2/9  Depression screen [facility] 2/9 12/12/2014 08/28/2014  Decreased Interest 0 2  Down, Depressed, Hopeless 0 1  PHQ - 2 Score 0 3  Altered sleeping - 1  Tired, decreased energy - 2  Change in appetite - 0  Feeling bad or failure about yourself  - 0  Trouble concentrating - 1  Moving slowly or fidgety/restless - 0  Suicidal thoughts - 0  PHQ-9 Score - 7      Review of Systems  All other systems reviewed and are negative.      Objective:   Physical Exam  Constitutional: He is oriented to person, place, and time. He appears well-developed and well-nourished.  HENT:  Head: Normocephalic and atraumatic.  Cochlear implant device bilaterally  Eyes: Conjunctivae and EOM are normal. Pupils are equal, round, and reactive to light.  Neck: Normal range of motion. Neck supple.  Neurological: He is alert and oriented to person, place, and time.  Psychiatric: He has a normal mood and affect.  Nursing note and vitals reviewed.  Cervical spine range of motion 75% flexion and extension lateral bending and rotation  Upper extremity strength is normal Gait is  normal Mood and affect are appropriate No pain to palpation lumbar paraspinal muscles. Reduced lumbar range of motion but no evidence of lumbar deformity or thoracic deformity     Assessment & Plan:  1.Cervical postlaminectomy syndrome with cervical stenosis.  Hedid not have a good result with Tylenol 3. We discussed other treatment options including Tylenol No. 4.Because of issues related to finances he would like to try the Tylenol for so he does not need to come in for monthly visits. He states that his primary physician was afraid that too much acetaminophen wouldn't hurt his kidneys. We did discuss that he had normal renal function and in fact nonsteroidals would be more likely cause nephrotoxicity. He will clarify this with his primary care physician 2. Chronic low back pain suspect lumbar spondylosis as well as chronic myofascial pain 3. Chronic pain syndrome he does have an element of depression he is currently on Cymbalta 60 mg per day which is a good choice given his comorbidities. Return to clinic in 3 months

## 2015-03-26 NOTE — Patient Instructions (Signed)
May Arnica Cream to painful area 2-3 times a day

## 2015-03-28 ENCOUNTER — Ambulatory Visit: Payer: Medicare Other | Admitting: Registered Nurse

## 2015-06-22 ENCOUNTER — Other Ambulatory Visit: Payer: Self-pay | Admitting: Family Medicine

## 2015-06-22 DIAGNOSIS — Z136 Encounter for screening for cardiovascular disorders: Secondary | ICD-10-CM

## 2015-06-26 ENCOUNTER — Encounter: Payer: Medicare Other | Attending: Physical Medicine & Rehabilitation | Admitting: Registered Nurse

## 2015-06-26 ENCOUNTER — Encounter: Payer: Self-pay | Admitting: Registered Nurse

## 2015-06-26 VITALS — BP 123/80 | HR 58 | Resp 14

## 2015-06-26 DIAGNOSIS — Z5181 Encounter for therapeutic drug level monitoring: Secondary | ICD-10-CM | POA: Diagnosis not present

## 2015-06-26 DIAGNOSIS — M542 Cervicalgia: Secondary | ICD-10-CM | POA: Diagnosis not present

## 2015-06-26 DIAGNOSIS — G8929 Other chronic pain: Secondary | ICD-10-CM | POA: Diagnosis not present

## 2015-06-26 DIAGNOSIS — G894 Chronic pain syndrome: Secondary | ICD-10-CM | POA: Diagnosis not present

## 2015-06-26 DIAGNOSIS — M545 Low back pain: Secondary | ICD-10-CM | POA: Diagnosis present

## 2015-06-26 DIAGNOSIS — M961 Postlaminectomy syndrome, not elsewhere classified: Secondary | ICD-10-CM | POA: Diagnosis not present

## 2015-06-26 DIAGNOSIS — Z79899 Other long term (current) drug therapy: Secondary | ICD-10-CM | POA: Diagnosis not present

## 2015-06-26 DIAGNOSIS — M62830 Muscle spasm of back: Secondary | ICD-10-CM

## 2015-06-26 DIAGNOSIS — M4802 Spinal stenosis, cervical region: Secondary | ICD-10-CM

## 2015-06-26 MED ORDER — ACETAMINOPHEN-CODEINE #4 300-60 MG PO TABS: 1.0000 | ORAL_TABLET | Freq: Four times a day (QID) | ORAL | Status: DC | PRN

## 2015-06-26 NOTE — Progress Notes (Signed)
Subjective:    Patient ID: Fred [name], male    DOB: 04-19-1951, 65 y.o.   MRN: [id number]  HPI: [name] is a 65 year old male who returns for follow up for chronic pain and medication refill. He says his pain is located in his neck radiating into his bilateral shoulder's and lower back. Also states the Tylenol #4 has not effectively manage his pain, it wears off after a few hours. We will increase his Tylenol #4 to every 6 hours. He was instructed to call office in a few weeks to evaluate treatment modality. He verbalizes understanding. He is aware if his pain remains uncontrolled we will resume the hydrocodone and he will have to come to office monthly. He verbalizes understanding. He rates his pain 8. His current exercise regime is walking a mile three times a week and performing 20 sit-ups daily. [name] forgot his medication bottle according to [facility] he picked up his prescription on 06/16/15. Educated on the [employer] and is aware if he forgets his bottle again, he will have to bring the bottle before a prescription will be issued. He verbalizes understanding. Arrived bradycardic apical pulse checked 60.  Pain Inventory Average Pain 7 Pain Right Now 8 My pain is tingling and aching  In the last 24 hours, has pain interfered with the following? General activity 6 Relation with others 7 Enjoyment of life 7 What TIME of day is your pain at its worst? daytime, evening Sleep (in general) Fair  Pain is worse with: sitting and standing Pain improves with: medication Relief from Meds: 2  Mobility walk without assistance how many minutes can you walk? 10  ability to climb steps?  yes do you drive?  yes  Function disabled: date disabled .  Neuro/Psych numbness spasms anxiety  Prior Studies Any changes since last visit?  no  Physicians involved in your care Any changes since last visit?  no   Family History  Problem Relation Age of Onset  .  Stroke Mother   . Heart disease Father   . Stroke Father     heat stroke  . Kidney disease Brother    Social History   Social History  . Marital Status: Married    Spouse Name: N/A  . Number of Children: N/A  . Years of Education: N/A   Social History Main Topics  . Smoking status: Former Smoker -- 1.00 packs/day for 30 years    Types: Cigarettes    Quit date: 05/25/2001  . Smokeless tobacco: Former Systems developer    Quit date: 09/22/2001  . Alcohol Use: No  . Drug Use: No  . Sexual Activity: Not Asked   Other Topics Concern  . None   Social History Narrative   Past Surgical History  Procedure Laterality Date  . Spine surgery      cervical disc removed and stabalized with bone- good mobility  . Brain surgery      inserted rod to connect to BaHa(Hearing device)  . Eye surgery      lasik  . Robot assisted laparoscopic radical prostatectomy N/A 08/04/2013    Procedure: ROBOTIC ASSISTED LAPAROSCOPIC RADICAL PROSTATECTOMY LEVEL 2;  Surgeon: Dutch Gray, MD;  Location: [facility];  Service: Urology;  Laterality: N/A;  . Lymphadenectomy Bilateral 08/04/2013    Procedure: LYMPHADENECTOMY;  Surgeon: Dutch Gray, MD;  Location: [facility];  Service: Urology;  Laterality: Bilateral;   Past Medical History  Diagnosis Date  . Neuromuscular disorder ([place])   .  Hypertension   . Hyperlipidemia   . History of kidney stones   . Cancer [facility])     prostate  . Hearing loss   . HOH (hard of hearing)   . Depression    BP 123/80 mmHg  Pulse 58  Resp 14  SpO2 97%  Opioid Risk Score:   Fall Risk Score:  `1  Depression screen PHQ 2/9  Depression screen [facility] 2/9 12/12/2014 08/28/2014  Decreased Interest 0 2  Down, Depressed, Hopeless 0 1  PHQ - 2 Score 0 3  Altered sleeping - 1  Tired, decreased energy - 2  Change in appetite - 0  Feeling bad or failure about yourself  - 0  Trouble concentrating - 1  Moving slowly or fidgety/restless - 0  Suicidal thoughts - 0  PHQ-9 Score - 7     Review of  Systems  Constitutional: Positive for unexpected weight change.  Respiratory: Positive for shortness of breath.   All other systems reviewed and are negative.      Objective:   Physical Exam  Constitutional: He is oriented to person, place, and time. He appears well-developed and well-nourished.  HENT:  Head: Normocephalic and atraumatic.  Neck: Normal range of motion. Neck supple.  Cervical Paraspinal Tenderness: C-5- C-6  Cardiovascular: Normal rate and regular rhythm.   Pulmonary/Chest: Effort normal and breath sounds normal.  Musculoskeletal:  Normal Muscle Bulk and Muscle Testing Reveals: Upper Extremities: Full ROM and Muscle Strength 5/5 Bilateral AC Joint Tenderness Bilateral Rhomboid Tenderness Lumbar Paraspinal Tenderness: L-1- L-2 Lower Extremities: Full ROM and Muscle Strength 5/5 Arises from chair with ease Narrow Based gait  Neurological: He is alert and oriented to person, place, and time.  Skin: Skin is warm and dry.  Psychiatric: He has a normal mood and affect.  Nursing note and vitals reviewed.         Assessment & Plan:  1. L2 compression fracture:  Refilled:Increased: Tylenol # 4 one tablet every 6 hours as needed for moderate pain. # 120. 2. Cervical post lami syndrome: Continue current medication regime. Also encouraged to continue with exercise and heat therapy.  3. Muscle Spasm: Continue: Tizanidine  20 minutes of face to face patient care time was spent during this visit. All questions were encouraged and answered.   F/U in 3 month

## 2015-07-01 LAB — 6-ACETYLMORPHINE,TOXASSURE ADD
6-ACETYLMORPHINE: NEGATIVE
6-ACETYLMORPHINE: NOT DETECTED ng/mg{creat}

## 2015-07-01 LAB — TOXASSURE SELECT,+ANTIDEPR,UR: PDF: 0

## 2015-07-02 NOTE — Progress Notes (Signed)
Urine drug screen for this encounter is consistent for prescribed medication 

## 2015-07-03 ENCOUNTER — Ambulatory Visit
Admission: RE | Admit: 2015-07-03 | Discharge: 2015-07-03 | Disposition: A | Discharge status: Home / Self Care | Payer: Medicare Other | Source: Ambulatory Visit | Attending: Family Medicine | Admitting: Family Medicine

## 2015-07-03 DIAGNOSIS — Z136 Encounter for screening for cardiovascular disorders: Secondary | ICD-10-CM

## 2015-09-17 ENCOUNTER — Ambulatory Visit: Payer: Medicare Other | Admitting: Physical Medicine & Rehabilitation

## 2015-10-08 ENCOUNTER — Encounter: Payer: Medicare Other | Attending: Physical Medicine & Rehabilitation

## 2015-10-08 ENCOUNTER — Ambulatory Visit (HOSPITAL_BASED_OUTPATIENT_CLINIC_OR_DEPARTMENT_OTHER): Payer: Medicare Other | Admitting: Physical Medicine & Rehabilitation

## 2015-10-08 ENCOUNTER — Encounter: Payer: Self-pay | Admitting: Physical Medicine & Rehabilitation

## 2015-10-08 VITALS — BP 113/75 | HR 56 | Resp 14

## 2015-10-08 DIAGNOSIS — S32020D Wedge compression fracture of second lumbar vertebra, subsequent encounter for fracture with routine healing: Secondary | ICD-10-CM | POA: Diagnosis not present

## 2015-10-08 DIAGNOSIS — M542 Cervicalgia: Secondary | ICD-10-CM | POA: Diagnosis not present

## 2015-10-08 DIAGNOSIS — M961 Postlaminectomy syndrome, not elsewhere classified: Secondary | ICD-10-CM | POA: Diagnosis not present

## 2015-10-08 DIAGNOSIS — M545 Low back pain: Secondary | ICD-10-CM | POA: Insufficient documentation

## 2015-10-08 DIAGNOSIS — G8929 Other chronic pain: Secondary | ICD-10-CM

## 2015-10-08 DIAGNOSIS — Z79899 Other long term (current) drug therapy: Secondary | ICD-10-CM | POA: Diagnosis not present

## 2015-10-08 DIAGNOSIS — Z5181 Encounter for therapeutic drug level monitoring: Secondary | ICD-10-CM | POA: Diagnosis not present

## 2015-10-08 MED ORDER — ACETAMINOPHEN-CODEINE #4 300-60 MG PO TABS: 1.0000 | ORAL_TABLET | Freq: Four times a day (QID) | ORAL | Status: DC | PRN

## 2015-10-08 NOTE — Patient Instructions (Signed)

## 2015-10-08 NOTE — Progress Notes (Signed)
Subjective:    Patient ID: Fred [name], male    DOB: [birth year], 65 y.o.   MRN: [id number]  HPI Chief complaint is neck pain radiating to the upper trapezius area. Bilateral hand numbness and tingling but no weakness. Numbness and tingling does not wake him up at night. He has symptoms mainly in the evening hours. No exacerbation with driving or reading. Stretching low back, does some abd strengthening exercise Sleeping well  Pain Inventory Average Pain 6 Pain Right Now 4 My pain is sharp, tingling and aching  In the last 24 hours, has pain interfered with the following? General activity 4 Relation with others 5 Enjoyment of life 0 What TIME of day is your pain at its worst? daytime , evening Sleep (in general) Fair  Pain is worse with: bending and standing Pain improves with: medication Relief from Meds: 5  Mobility walk without assistance how many minutes can you walk? 10 ability to climb steps?  yes do you drive?  yes  Function disabled: date disabled .  Neuro/Psych anxiety loss of taste or smell  Prior Studies Any changes since last visit?  no  Physicians involved in your care Any changes since last visit?  no   Family History  Problem Relation Age of Onset  . Stroke Mother   . Heart disease Father   . Stroke Father     heat stroke  . Kidney disease Brother    Social History   Social History  . Marital Status: Married    Spouse Name: N/A  . Number of Children: N/A  . Years of Education: N/A   Social History Main Topics  . Smoking status: Former Smoker -- 1.00 packs/day for 30 years    Types: Cigarettes    Quit date: 05/25/2001  . Smokeless tobacco: Former Systems developer    Quit date: 09/22/2001  . Alcohol Use: No  . Drug Use: No  . Sexual Activity: Not Asked   Other Topics Concern  . None   Social History Narrative   Past Surgical History  Procedure Laterality Date  . Spine surgery      cervical disc removed and stabalized with bone-  good mobility  . Brain surgery      inserted rod to connect to BaHa(Hearing device)  . Eye surgery      lasik  . Robot assisted laparoscopic radical prostatectomy N/A 08/04/2013    Procedure: ROBOTIC ASSISTED LAPAROSCOPIC RADICAL PROSTATECTOMY LEVEL 2;  Surgeon: Dutch Gray, MD;  Location: [facility];  Service: Urology;  Laterality: N/A;  . Lymphadenectomy Bilateral 08/04/2013    Procedure: LYMPHADENECTOMY;  Surgeon: Dutch Gray, MD;  Location: [facility];  Service: Urology;  Laterality: Bilateral;   Past Medical History  Diagnosis Date  . Neuromuscular disorder ([place])   . Hypertension   . Hyperlipidemia   . History of kidney stones   . Cancer [facility])     prostate  . Hearing loss   . HOH (hard of hearing)   . Depression    BP 113/75 mmHg  Pulse 56  Resp 14  SpO2 97%  Opioid Risk Score:   Fall Risk Score:  `1  Depression screen PHQ 2/9  Depression screen [facility] 2/9 12/12/2014 08/28/2014  Decreased Interest 0 2  Down, Depressed, Hopeless 0 1  PHQ - 2 Score 0 3  Altered sleeping - 1  Tired, decreased energy - 2  Change in appetite - 0  Feeling bad or failure about yourself  - 0  Trouble  concentrating - 1  Moving slowly or fidgety/restless - 0  Suicidal thoughts - 0  PHQ-9 Score - 7     Review of Systems  All other systems reviewed and are negative.      Objective:   Physical Exam  Constitutional: He is oriented to person, place, and time. He appears well-developed and well-nourished.  HENT:  Head: Normocephalic and atraumatic.  Eyes: Conjunctivae and EOM are normal. Pupils are equal, round, and reactive to light.  Neck:  75% range, flex, ext  Neurological: He is alert and oriented to person, place, and time.  Psychiatric: He has a normal mood and affect.  Nursing note and vitals reviewed. Negative Tinel's At bilateral wrists No evidence of hand intrinsic atrophy Status post right middle finger digital amputation Sensation intact to pinprick bilateral C5 C6 and C8 dermatomal  distribution. Right C7 could not be tested due to middle finger amputation Gait shows no evidence of toe drag or knee instability Low back is no tenderness to palpation along the lumbosacral junction. There is some tenderness around L3 bilaterally at the paraspinals Negative straight leg raising test         Assessment & Plan:  1. 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 He's had good relief with Tylenol No. 4, 1 by mouth 4 times a day No signs of misuse He has been able to exercise more including walking 1 mile a day , Patient has been losing weight with diet Continue opioid monitoring program. This consists of regular clinic visits, examinations, urine drug screen, pill counts as well as use of [place] controlled substance reporting System.  2. Lumbar pain upper lumbar history of L2 compression fracture overall symptoms have been stable. No radicular pain.  3. Bilateral hand numbness and tingling examination is unremarkable. Differential diagnosis includes cervical chronic radiculitis versus carpal tunnel. Continue Cymbalta, if this worsens may consider EMG/NCV of both upper limbs

## 2015-11-02 IMAGING — CR DG CHEST 2V
2 series · 2 of 2 positions shown · non-contrast
Comparison: None.

CLINICAL DATA: Preop prior to prostate surgery

EXAM:
CHEST  2 VIEW

[w chest pa]
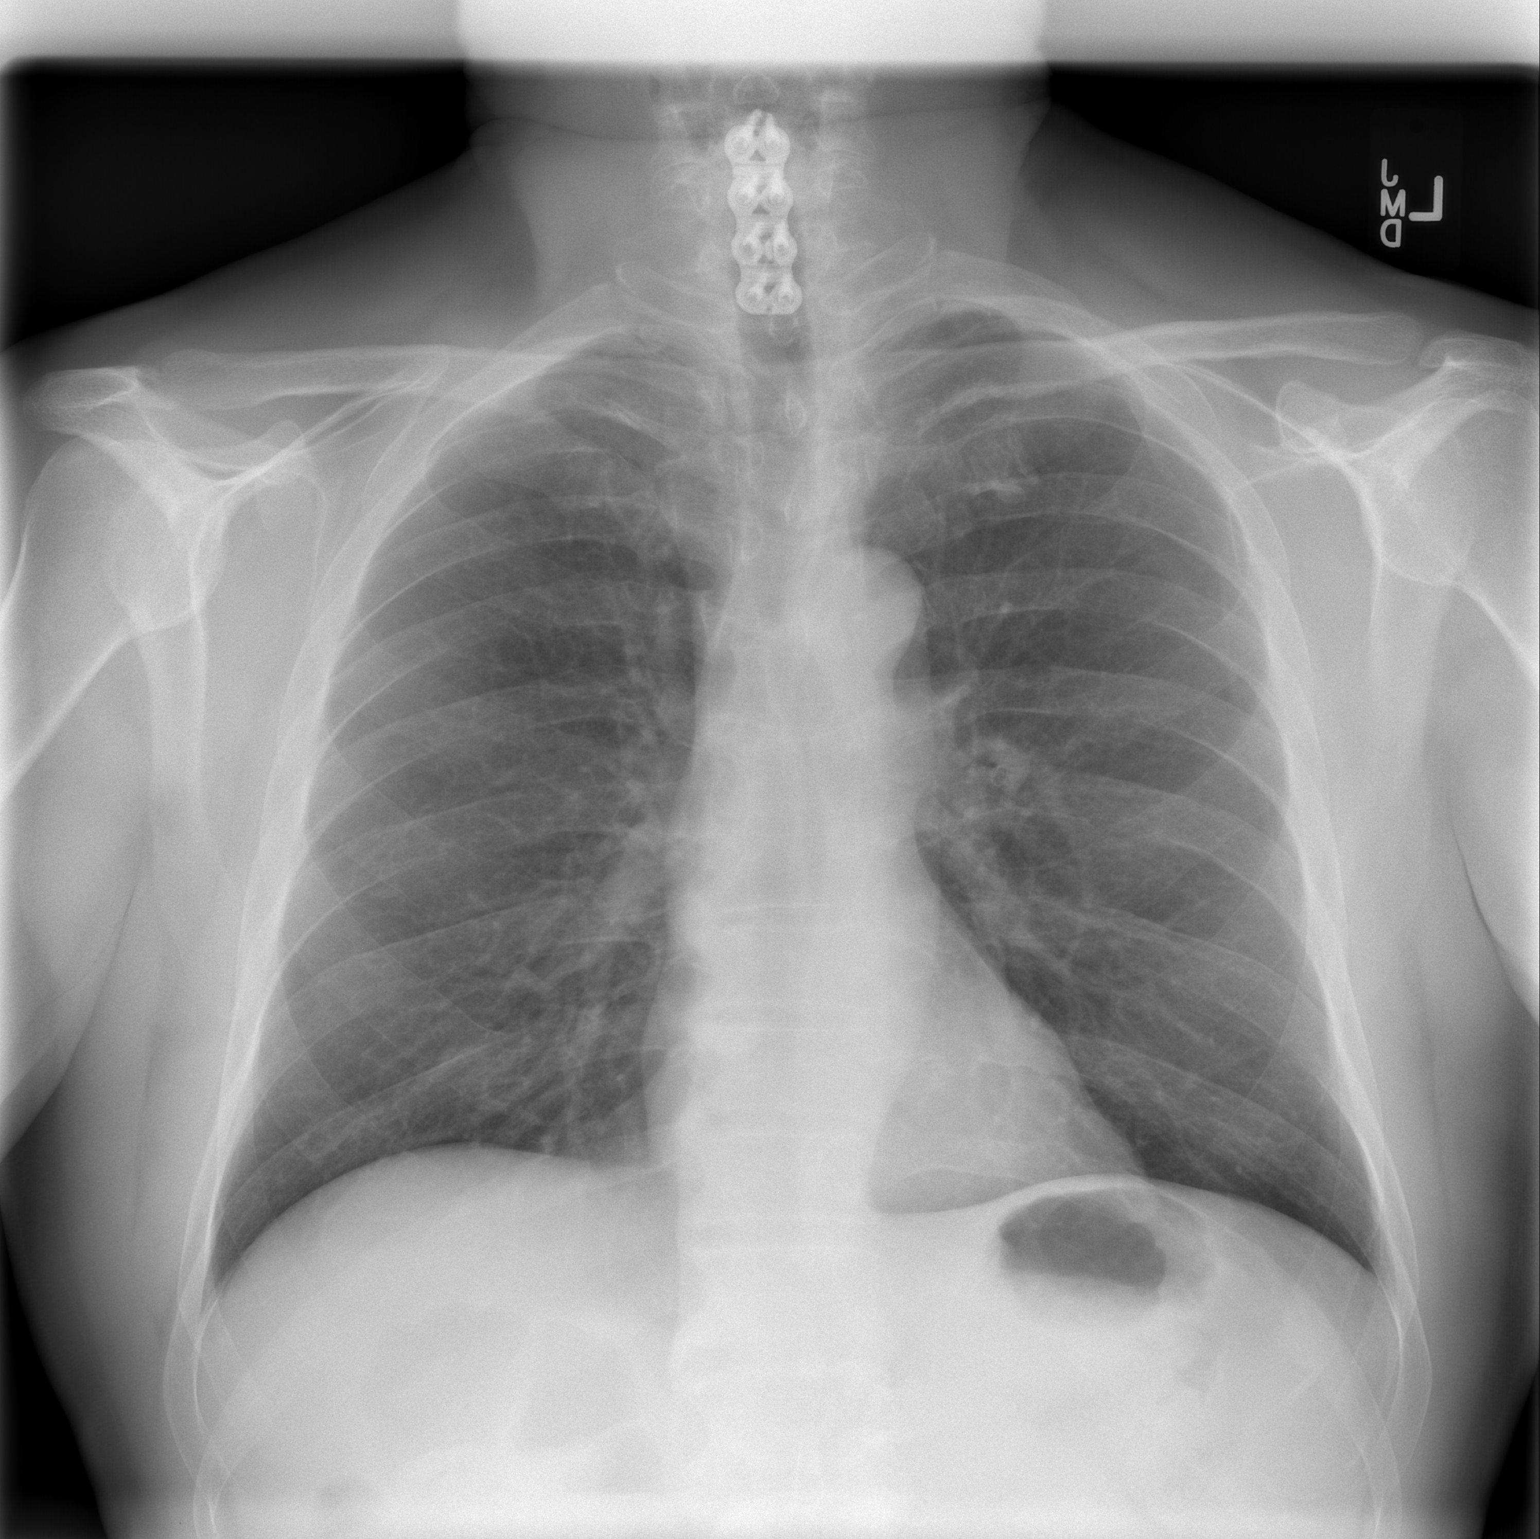

[w chest lat]
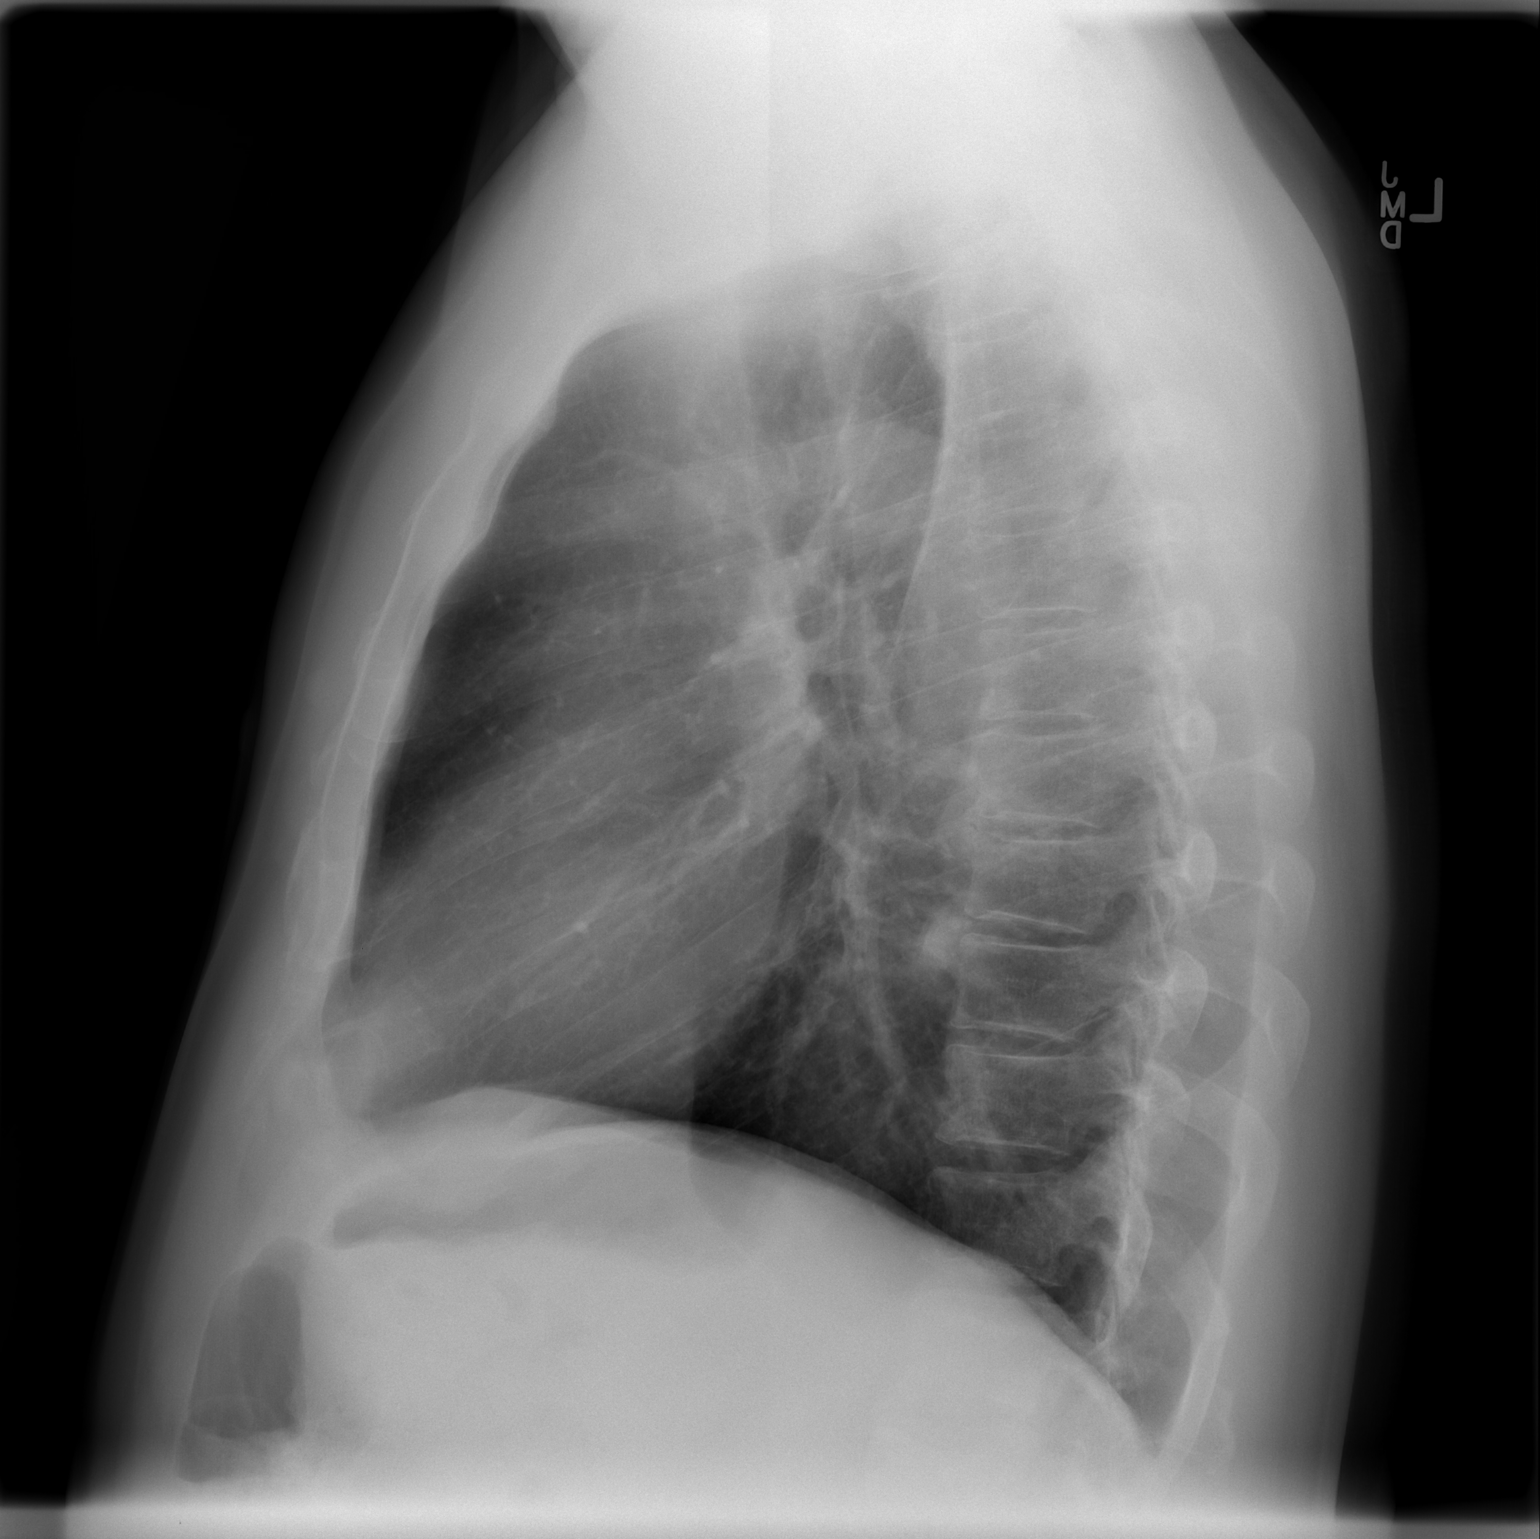

[2 of 2 positions shown; findings below may reference images not displayed]

FINDINGS: The lungs are well-expanded and clear. The cardiac silhouette is
normal in size. The mediastinum is normal in width. The pulmonary
vascularity is not engorged. There is mild stable tortuosity of the
descending thoracic aorta. The bony thorax exhibits no acute
abnormalities.
IMPRESSION: There is no evidence of active cardiopulmonary disease.

## 2016-02-12 ENCOUNTER — Ambulatory Visit: Payer: Medicare Other | Admitting: Physical Medicine & Rehabilitation

## 2016-04-14 ENCOUNTER — Ambulatory Visit (HOSPITAL_BASED_OUTPATIENT_CLINIC_OR_DEPARTMENT_OTHER): Payer: Medicare Other | Admitting: Physical Medicine & Rehabilitation

## 2016-04-14 ENCOUNTER — Encounter: Payer: Self-pay | Admitting: Physical Medicine & Rehabilitation

## 2016-04-14 ENCOUNTER — Encounter: Payer: Medicare Other | Attending: Physical Medicine & Rehabilitation

## 2016-04-14 VITALS — BP 120/78 | HR 78 | Resp 14

## 2016-04-14 DIAGNOSIS — Z823 Family history of stroke: Secondary | ICD-10-CM | POA: Insufficient documentation

## 2016-04-14 DIAGNOSIS — G8929 Other chronic pain: Secondary | ICD-10-CM | POA: Diagnosis not present

## 2016-04-14 DIAGNOSIS — Z87442 Personal history of urinary calculi: Secondary | ICD-10-CM | POA: Insufficient documentation

## 2016-04-14 DIAGNOSIS — M545 Low back pain: Secondary | ICD-10-CM | POA: Diagnosis not present

## 2016-04-14 DIAGNOSIS — X58XXXA Exposure to other specified factors, initial encounter: Secondary | ICD-10-CM | POA: Insufficient documentation

## 2016-04-14 DIAGNOSIS — F329 Major depressive disorder, single episode, unspecified: Secondary | ICD-10-CM | POA: Insufficient documentation

## 2016-04-14 DIAGNOSIS — Z9889 Other specified postprocedural states: Secondary | ICD-10-CM | POA: Diagnosis not present

## 2016-04-14 DIAGNOSIS — Z8546 Personal history of malignant neoplasm of prostate: Secondary | ICD-10-CM | POA: Insufficient documentation

## 2016-04-14 DIAGNOSIS — Z841 Family history of disorders of kidney and ureter: Secondary | ICD-10-CM | POA: Diagnosis not present

## 2016-04-14 DIAGNOSIS — Z8249 Family history of ischemic heart disease and other diseases of the circulatory system: Secondary | ICD-10-CM | POA: Insufficient documentation

## 2016-04-14 DIAGNOSIS — I10 Essential (primary) hypertension: Secondary | ICD-10-CM | POA: Diagnosis not present

## 2016-04-14 DIAGNOSIS — H9190 Unspecified hearing loss, unspecified ear: Secondary | ICD-10-CM | POA: Insufficient documentation

## 2016-04-14 DIAGNOSIS — Z87891 Personal history of nicotine dependence: Secondary | ICD-10-CM | POA: Diagnosis not present

## 2016-04-14 DIAGNOSIS — S39012A Strain of muscle, fascia and tendon of lower back, initial encounter: Secondary | ICD-10-CM | POA: Insufficient documentation

## 2016-04-14 DIAGNOSIS — Z9079 Acquired absence of other genital organ(s): Secondary | ICD-10-CM | POA: Diagnosis not present

## 2016-04-14 MED ORDER — TIZANIDINE HCL 4 MG PO TABS: 4.0000 mg | ORAL_TABLET | Freq: Four times a day (QID) | ORAL | 0 refills | Status: DC | PRN

## 2016-04-14 NOTE — Patient Instructions (Signed)
Please stay active, do not sit for more than 30 minutes at a time. Alternate sitting and standing.

## 2016-04-14 NOTE — Progress Notes (Signed)
Subjective:    Patient ID: Fred [name], male    DOB: 1950/11/13, 65 y.o.   MRN: [id number]  HPI Patient complaining of right greater than left-sided low back pain. Was putting up Christmas lights when he felt a pop in his low back area. Patient went down to his knees felt a jar as well. No pain shooting down the legs Patient has been using heating pad. Patient has not had any relief thus far. Has tried Aleve, Tylenol and this has not been helping. Has also tried tylenol 4 left over from Rx in May  Past history significant for L2 compression fracture  Pain Inventory Average Pain 9 Pain Right Now 9 My pain is constant  In the last 24 hours, has pain interfered with the following? General activity 10 Relation with others 10 Enjoyment of life 10 What TIME of day is your pain at its worst? all Sleep (in general) Poor  Pain is worse with: unsure Pain improves with: heat/ice Relief from Meds: 0  Mobility walk without assistance  Function I need assistance with the following:  meal prep, household duties and shopping  Neuro/Psych trouble walking spasms  Prior Studies Any changes since last visit?  no  Physicians involved in your care Any changes since last visit?  no   Family History  Problem Relation Age of Onset  . Stroke Mother   . Heart disease Father   . Stroke Father     heat stroke  . Kidney disease Brother    Social History   Social History  . Marital status: Married    Spouse name: N/A  . Number of children: N/A  . Years of education: N/A   Social History Main Topics  . Smoking status: Former Smoker    Packs/day: 1.00    Years: 30.00    Types: Cigarettes    Quit date: 05/25/2001  . Smokeless tobacco: Former Systems developer    Quit date: 09/22/2001  . Alcohol use No  . Drug use: No  . Sexual activity: Not Asked   Other Topics Concern  . None   Social History Narrative  . None   Past Surgical History:  Procedure Laterality Date  . BRAIN  SURGERY     inserted rod to connect to BaHa(Hearing device)  . EYE SURGERY     lasik  . LYMPHADENECTOMY Bilateral 08/04/2013   Procedure: LYMPHADENECTOMY;  Surgeon: Dutch Gray, MD;  Location: [facility];  Service: Urology;  Laterality: Bilateral;  . ROBOT ASSISTED LAPAROSCOPIC RADICAL PROSTATECTOMY N/A 08/04/2013   Procedure: ROBOTIC ASSISTED LAPAROSCOPIC RADICAL PROSTATECTOMY LEVEL 2;  Surgeon: Dutch Gray, MD;  Location: [facility];  Service: Urology;  Laterality: N/A;  . SPINE SURGERY     cervical disc removed and stabalized with bone- good mobility   Past Medical History:  Diagnosis Date  . Cancer [facility])    prostate  . Depression   . Hearing loss   . History of kidney stones   . HOH (hard of hearing)   . Hyperlipidemia   . Hypertension   . Neuromuscular disorder ([facility])    BP 120/78   Pulse 78   Resp 14   SpO2 97%   Opioid Risk Score:   Fall Risk Score:  `1  Depression screen PHQ 2/9  Depression screen [facility] 2/9 12/12/2014 08/28/2014  Decreased Interest 0 2  Down, Depressed, Hopeless 0 1  PHQ - 2 Score 0 3  Altered sleeping - 1  Tired, decreased energy - 2  Change in appetite - 0  Feeling bad or failure about yourself  - 0  Trouble concentrating - 1  Moving slowly or fidgety/restless - 0  Suicidal thoughts - 0  PHQ-9 Score - 7   Review of Systems  Constitutional: Negative.   HENT: Negative.   Eyes: Negative.   Respiratory: Negative.   Cardiovascular: Negative.   Gastrointestinal: Negative.   Endocrine: Negative.   Genitourinary: Negative.   Musculoskeletal: Positive for back pain.  Skin: Negative.   Allergic/Immunologic: Negative.   Neurological: Negative.   Hematological: Negative.   Psychiatric/Behavioral: Negative.   All other systems reviewed and are negative.      Objective:   Physical Exam  Constitutional: He is oriented to person, place, and time. He appears well-developed and well-nourished.  HENT:  Head: Normocephalic and atraumatic.  Musculoskeletal:         Lumbar back: He exhibits decreased range of motion and tenderness.  Decreased range of motion lumbar spine. Approximately 25% flexion, extension, lateral bending and rotation. Negative straight leg  Tenderness mainly along the paraspinal muscles lateral to the erector spinae muscle group on the right side. Left side. Corresponding to quadratus lumborum area.   Neurological: He is alert and oriented to person, place, and time. He has normal strength. Gait normal.  Motor strength is 5/5 bilateral upper limbs  Psychiatric: He has a normal mood and affect.  Nursing note and vitals reviewed.  No sensation deficits       Assessment & Plan:  1. Acute lumbar strain, appears to be mainly muscular. No radicular findings. We discussed this was most likely from prolonged lumbar hyperextension.  We'll prescribe Zanaflex as a muscle relaxer 4 mg 3 times a day or 4 times a day when necessary if he becomes drowsy. He may take 1 half tablet. Anticipate improvement in 2 weeks. If not, call for x-ray ordered lumbar spine.  May continue Tylenol No. 4 on a when necessary basis. He was taken this very sparingly prior to his most recent injury.

## 2016-04-28 ENCOUNTER — Telehealth: Payer: Self-pay | Admitting: *Deleted

## 2016-04-28 MED ORDER — ACETAMINOPHEN-CODEINE #4 300-60 MG PO TABS: 1.0000 | ORAL_TABLET | Freq: Four times a day (QID) | ORAL | 0 refills | Status: DC | PRN

## 2016-04-28 NOTE — Telephone Encounter (Signed)
[facility] pharmacist calling for refill on Tyl#4.  Last fill was in October and he uses sparingly.  I called one month refill to pharmacy since there was no return appt on schedule, but he has just seen Dr Letta Pate 04/14/16.  Requires q 3 month appt if he is to have further refills.

## 2016-08-19 ENCOUNTER — Encounter: Payer: Medicare Other | Attending: Physical Medicine & Rehabilitation

## 2016-08-19 ENCOUNTER — Ambulatory Visit (HOSPITAL_BASED_OUTPATIENT_CLINIC_OR_DEPARTMENT_OTHER): Payer: Medicare Other | Admitting: Physical Medicine & Rehabilitation

## 2016-08-19 ENCOUNTER — Encounter: Payer: Self-pay | Admitting: Physical Medicine & Rehabilitation

## 2016-08-19 ENCOUNTER — Ambulatory Visit
Admission: RE | Admit: 2016-08-19 | Discharge: 2016-08-19 | Disposition: A | Discharge status: Home / Self Care | Payer: Medicare Other | Source: Ambulatory Visit | Attending: Physical Medicine & Rehabilitation | Admitting: Physical Medicine & Rehabilitation

## 2016-08-19 ENCOUNTER — Ambulatory Visit: Payer: Medicare Other | Admitting: Physical Medicine & Rehabilitation

## 2016-08-19 VITALS — BP 154/97 | HR 65 | Resp 14

## 2016-08-19 DIAGNOSIS — I10 Essential (primary) hypertension: Secondary | ICD-10-CM | POA: Diagnosis not present

## 2016-08-19 DIAGNOSIS — H9190 Unspecified hearing loss, unspecified ear: Secondary | ICD-10-CM | POA: Insufficient documentation

## 2016-08-19 DIAGNOSIS — Z841 Family history of disorders of kidney and ureter: Secondary | ICD-10-CM | POA: Diagnosis not present

## 2016-08-19 DIAGNOSIS — M25512 Pain in left shoulder: Secondary | ICD-10-CM

## 2016-08-19 DIAGNOSIS — E785 Hyperlipidemia, unspecified: Secondary | ICD-10-CM | POA: Insufficient documentation

## 2016-08-19 DIAGNOSIS — Z981 Arthrodesis status: Secondary | ICD-10-CM | POA: Diagnosis not present

## 2016-08-19 DIAGNOSIS — M7918 Myalgia, other site: Secondary | ICD-10-CM

## 2016-08-19 DIAGNOSIS — M791 Myalgia: Secondary | ICD-10-CM | POA: Diagnosis not present

## 2016-08-19 DIAGNOSIS — G8929 Other chronic pain: Secondary | ICD-10-CM

## 2016-08-19 DIAGNOSIS — Z87442 Personal history of urinary calculi: Secondary | ICD-10-CM | POA: Diagnosis not present

## 2016-08-19 DIAGNOSIS — M25511 Pain in right shoulder: Secondary | ICD-10-CM

## 2016-08-19 DIAGNOSIS — Z8249 Family history of ischemic heart disease and other diseases of the circulatory system: Secondary | ICD-10-CM | POA: Diagnosis not present

## 2016-08-19 DIAGNOSIS — M542 Cervicalgia: Secondary | ICD-10-CM

## 2016-08-19 DIAGNOSIS — Z823 Family history of stroke: Secondary | ICD-10-CM | POA: Diagnosis not present

## 2016-08-19 DIAGNOSIS — Z87891 Personal history of nicotine dependence: Secondary | ICD-10-CM | POA: Insufficient documentation

## 2016-08-19 DIAGNOSIS — M961 Postlaminectomy syndrome, not elsewhere classified: Secondary | ICD-10-CM

## 2016-08-19 MED ORDER — ACETAMINOPHEN-CODEINE #4 300-60 MG PO TABS: 1.0000 | ORAL_TABLET | Freq: Four times a day (QID) | ORAL | 0 refills | Status: DC | PRN

## 2016-08-19 NOTE — Progress Notes (Addendum)
Subjective:    Patient ID: Fred [name], male    DOB: [birth year], 66 y.o.   MRN: [id number] Hx of remote C4-7 Fusion HPI CC Neck and shoulder pain Low back pain "Joint pain" in shouldersHim a no falls or trauma. He has pain in his upper traps, as well as his shoulders and his midline of the neck.  Able to dress and bathe himself Sleep is inconsistent Does some exercises for back strengthening and stretching Walks in the yard  Has been taking OTC ASA No longer taking tizanidine Taking Cymbalta 60mg  /d.  does not seem to be helping back pain  Tylenol #4 60mg  /300mg , Has been out for several days. Patient states it has not been helping as much recently. He has been limiting his activities due to pain EXAM: CERVICAL SPINE - 2-3 VIEW  COMPARISON:  Cervical spine films of 04/28/2016  FINDINGS: The cervical vertebrae are in normal alignment. Anterior fusion appears stable from C4-C7 with interbody fusion plugs unchanged in position. There appears to be solid fusion at each level. No prevertebral soft tissue swelling is seen. The odontoid process is intact.  IMPRESSION: Stable anterior fusion from C4-C7 with normal alignment.   Electronically Signed   By: Ivar Drape M.D.   On: 08/19/2016 14:23  CLINICAL DATA:  Chronic bilateral shoulder pain for years right greater than left  EXAM: LEFT SHOULDER - 2+ VIEW  COMPARISON:  None.  FINDINGS: Four views of the left shoulder submitted. No acute fracture or subluxation. Mild degenerative changes AC joint. Glenohumeral joint is preserved.  IMPRESSION: No acute fracture or subluxation. Mild degenerative changes AC joint.   Electronically Signed   By: Lahoma Crocker M.D.   On: 08/19/2016 13:54  EXAM: RIGHT SHOULDER - 2+ VIEW  COMPARISON:  None.  FINDINGS: The right humeral head is in normal position. The glenohumeral joint space appears normal. The right AC joint is normal. The ribs that are  visualized are intact.  IMPRESSION: Negative.   Electronically Signed   By: Ivar Drape M.D.   On: 08/19/2016 13:56 Pain Inventory Average Pain 5 Pain Right Now 5 My pain is constant and aching  In the last 24 hours, has pain interfered with the following? General activity 6 Relation with others 8 Enjoyment of life 8 What TIME of day is your pain at its worst? all Sleep (in general) Poor  Pain is worse with: unsure Pain improves with: nothing Relief from Meds: 0  Mobility walk without assistance how many minutes can you walk? 5-10 ability to climb steps?  yes  Function retired  Neuro/Psych bladder control problems numbness depression anxiety  Prior Studies Any changes since last visit?  no  Physicians involved in your care Any changes since last visit?  no   Family History  Problem Relation Age of Onset  . Stroke Mother   . Heart disease Father   . Stroke Father     heat stroke  . Kidney disease Brother    Social History   Social History  . Marital status: Married    Spouse name: N/A  . Number of children: N/A  . Years of education: N/A   Social History Main Topics  . Smoking status: Former Smoker    Packs/day: 1.00    Years: 30.00    Types: Cigarettes    Quit date: 05/25/2001  . Smokeless tobacco: Former Systems developer    Quit date: 09/22/2001  . Alcohol use No  . Drug use: No  .  Sexual activity: Not Asked   Other Topics Concern  . None   Social History Narrative  . None   Past Surgical History:  Procedure Laterality Date  . BRAIN SURGERY     inserted rod to connect to BaHa(Hearing device)  . EYE SURGERY     lasik  . LYMPHADENECTOMY Bilateral 08/04/2013   Procedure: LYMPHADENECTOMY;  Surgeon: Dutch Gray, MD;  Location: [facility];  Service: Urology;  Laterality: Bilateral;  . ROBOT ASSISTED LAPAROSCOPIC RADICAL PROSTATECTOMY N/A 08/04/2013   Procedure: ROBOTIC ASSISTED LAPAROSCOPIC RADICAL PROSTATECTOMY LEVEL 2;  Surgeon: Dutch Gray, MD;   Location: [facility];  Service: Urology;  Laterality: N/A;  . SPINE SURGERY     cervical disc removed and stabalized with bone- good mobility   Past Medical History:  Diagnosis Date  . Cancer [facility])    prostate  . Depression   . Hearing loss   . History of kidney stones   . HOH (hard of hearing)   . Hyperlipidemia   . Hypertension   . Neuromuscular disorder (HCC)    BP (!) 154/97 (BP Location: Right Arm, Patient Position: Sitting, Cuff Size: Normal)   Pulse 65   Resp 14   SpO2 96%   Opioid Risk Score:   Fall Risk Score:  `1  Depression screen PHQ 2/9  Depression screen [facility] 2/9 12/12/2014 08/28/2014  Decreased Interest 0 2  Down, Depressed, Hopeless 0 1  PHQ - 2 Score 0 3  Altered sleeping - 1  Tired, decreased energy - 2  Change in appetite - 0  Feeling bad or failure about yourself  - 0  Trouble concentrating - 1  Moving slowly or fidgety/restless - 0  Suicidal thoughts - 0  PHQ-9 Score - 7    Review of Systems  Constitutional: Negative.   HENT: Negative.   Eyes: Negative.   Respiratory: Negative.   Cardiovascular: Negative.   Gastrointestinal: Negative.   Endocrine: Negative.   Genitourinary: Positive for difficulty urinating.  Musculoskeletal: Positive for back pain, gait problem and neck pain.  Allergic/Immunologic: Negative.   Neurological: Positive for numbness.  Hematological: Negative.   Psychiatric/Behavioral: Positive for dysphoric mood. The patient is nervous/anxious.   All other systems reviewed and are negative.      Objective:   Physical Exam  Constitutional: He is oriented to person, place, and time. He appears well-developed and well-nourished.  HENT:  Head: Normocephalic and atraumatic.  Musculoskeletal:       Right shoulder: He exhibits decreased range of motion and decreased strength. He exhibits no tenderness, no effusion and no deformity.       Left shoulder: He exhibits decreased range of motion and decreased strength. He exhibits no  tenderness, no bony tenderness, no effusion and no deformity.       Cervical back: He exhibits decreased range of motion and tenderness. He exhibits no edema and no deformity.  Neurological: He is alert and oriented to person, place, and time. Gait normal.  Reflex Scores:      Tricep reflexes are 2+ on the right side and 2+ on the left side.      Bicep reflexes are 2+ on the right side and 2+ on the left side.      Brachioradialis reflexes are 2+ on the right side and 2+ on the left side. 5/5 strength, bilateral deltoid, biceps, triceps, grip Normal sensation to pinprick bilateral C5, C6-C7, C8 distribution  Psychiatric: He has a normal mood and affect.  Nursing note and vitals  reviewed.         Assessment & Plan:  1. Acute exacerbation of chronic neck pain. He is actually complaining more of shoulder pain at the current time, but also has some upper trapezius pain. He's had no falls or trauma. Recommend trigger point injections to the upper trapezius today. Ordered bilateral shoulder x-rays as well as cervical spine x-ray Continue Tylenol No. 4. One tablet 4 times a day May need to resume hydrocodone 5 mg 3 times a day if above is not helpful Heat as well as range of motion exercises for neck. May benefit from shoulder injections, right subacromial, may have some rotator cuff impingement. Consider left acromioclavicular injection under ultrasound guidance  Trigger Point Injection  Indication:  Myofascial pain not relieved by medication management and other conservative care.  Informed consent was obtained after describing risk and benefits of the procedure with the patient, this includes bleeding, bruising, infection and medication side effects.  The patient wishes to proceed and has given written consent.  The patient was placed in a Seated position.  Bilateral Upper trap area and C7-T1 interspinous ligament area was marked and prepped with Betadine.  It was entered with a 25-gauge  1-1/2 inch needle and 1 mL of 1% lidocaine was injected into each of 3 trigger points, after negative draw back for blood.  The patient tolerated the procedure well.  Post procedure instructions were given.

## 2016-08-19 NOTE — Patient Instructions (Signed)
Will need to review x-rays of the neck as well as the shoulders. May need shoulder injections next month. Will need to continue current medications until we have a better idea of what is causing your pain

## 2016-09-18 ENCOUNTER — Encounter: Payer: Medicare Other | Attending: Physical Medicine & Rehabilitation

## 2016-09-18 ENCOUNTER — Ambulatory Visit (HOSPITAL_BASED_OUTPATIENT_CLINIC_OR_DEPARTMENT_OTHER): Payer: Medicare Other | Admitting: Physical Medicine & Rehabilitation

## 2016-09-18 ENCOUNTER — Encounter: Payer: Self-pay | Admitting: Physical Medicine & Rehabilitation

## 2016-09-18 VITALS — BP 145/92 | HR 55 | Resp 14

## 2016-09-18 DIAGNOSIS — Z823 Family history of stroke: Secondary | ICD-10-CM | POA: Diagnosis not present

## 2016-09-18 DIAGNOSIS — M542 Cervicalgia: Secondary | ICD-10-CM | POA: Insufficient documentation

## 2016-09-18 DIAGNOSIS — Z8249 Family history of ischemic heart disease and other diseases of the circulatory system: Secondary | ICD-10-CM | POA: Diagnosis not present

## 2016-09-18 DIAGNOSIS — Z981 Arthrodesis status: Secondary | ICD-10-CM | POA: Insufficient documentation

## 2016-09-18 DIAGNOSIS — M25511 Pain in right shoulder: Secondary | ICD-10-CM | POA: Insufficient documentation

## 2016-09-18 DIAGNOSIS — E785 Hyperlipidemia, unspecified: Secondary | ICD-10-CM | POA: Diagnosis not present

## 2016-09-18 DIAGNOSIS — G8929 Other chronic pain: Secondary | ICD-10-CM | POA: Insufficient documentation

## 2016-09-18 DIAGNOSIS — M7918 Myalgia, other site: Secondary | ICD-10-CM

## 2016-09-18 DIAGNOSIS — M545 Low back pain: Secondary | ICD-10-CM | POA: Diagnosis not present

## 2016-09-18 DIAGNOSIS — Z87891 Personal history of nicotine dependence: Secondary | ICD-10-CM | POA: Diagnosis not present

## 2016-09-18 DIAGNOSIS — Z87442 Personal history of urinary calculi: Secondary | ICD-10-CM | POA: Diagnosis not present

## 2016-09-18 DIAGNOSIS — H9190 Unspecified hearing loss, unspecified ear: Secondary | ICD-10-CM | POA: Insufficient documentation

## 2016-09-18 DIAGNOSIS — M25512 Pain in left shoulder: Secondary | ICD-10-CM | POA: Insufficient documentation

## 2016-09-18 DIAGNOSIS — M961 Postlaminectomy syndrome, not elsewhere classified: Secondary | ICD-10-CM | POA: Diagnosis not present

## 2016-09-18 DIAGNOSIS — Z841 Family history of disorders of kidney and ureter: Secondary | ICD-10-CM | POA: Diagnosis not present

## 2016-09-18 DIAGNOSIS — M791 Myalgia: Secondary | ICD-10-CM | POA: Diagnosis not present

## 2016-09-18 DIAGNOSIS — I10 Essential (primary) hypertension: Secondary | ICD-10-CM | POA: Diagnosis not present

## 2016-09-18 MED ORDER — HYDROCODONE-ACETAMINOPHEN 5-325 MG PO TABS: 1.0000 | ORAL_TABLET | Freq: Three times a day (TID) | ORAL | 0 refills | Status: DC | PRN

## 2016-09-18 NOTE — Patient Instructions (Addendum)
Please follow up with PCP in regards to weight loss  May see Dr Ronnald Ramp again for your neck, my impression is that it is mainly muscular  Will try Hydrocodone 5mg  Three times a day  Shoulder and neck xrays look ok, except some arthritis Left shoulder

## 2016-09-18 NOTE — Progress Notes (Signed)
Subjective:    Patient ID: Fred [name], male    DOB: 04-16-1951, 66 y.o.   MRN: [id number]  HPI The patient reports about 2 day relief after trigger point injections to the upper traps. Reviewed results of cervical spine and shoulder x-rays. C-spine showed no hardware failure. No other acute issues. Shoulder x-rays negative except for some left shoulder mild acromioclavicular joint arthritis. No progressive weakness in the upper or lower limbs. No new numbness. No new bowel or bladder dysfunction. Patient feels like she was more functional and able to do more walking when he was on the hydrocodone 5 mg rather than the Tylenol with Codeine. Continues with low back pain but this is not as bad as his neck pain. No recent falls. Pain Inventory Average Pain 4 Pain Right Now 4 My pain is stabbing and aching  In the last 24 hours, has pain interfered with the following? General activity 3 Relation with others 3 Enjoyment of life 3 What TIME of day is your pain at its worst? morning, evening Sleep (in general) Fair  Pain is worse with: walking, bending and sitting Pain improves with: pacing activities and medication Relief from Meds: 1  Mobility walk without assistance ability to climb steps?  yes do you drive?  yes  Function disabled: date disabled .  Neuro/Psych numbness depression loss of taste or smell  Prior Studies Any changes since last visit?  no  Physicians involved in your care Any changes since last visit?  no   Family History  Problem Relation Age of Onset  . Stroke Mother   . Heart disease Father   . Stroke Father     heat stroke  . Kidney disease Brother    Social History   Social History  . Marital status: Married    Spouse name: N/A  . Number of children: N/A  . Years of education: N/A   Social History Main Topics  . Smoking status: Former Smoker    Packs/day: 1.00    Years: 30.00    Types: Cigarettes    Quit date: 05/25/2001  .  Smokeless tobacco: Former Systems developer    Quit date: 09/22/2001  . Alcohol use No  . Drug use: No  . Sexual activity: Not Asked   Other Topics Concern  . None   Social History Narrative  . None   Past Surgical History:  Procedure Laterality Date  . BRAIN SURGERY     inserted rod to connect to BaHa(Hearing device)  . EYE SURGERY     lasik  . LYMPHADENECTOMY Bilateral 08/04/2013   Procedure: LYMPHADENECTOMY;  Surgeon: Dutch Gray, MD;  Location: [facility];  Service: Urology;  Laterality: Bilateral;  . ROBOT ASSISTED LAPAROSCOPIC RADICAL PROSTATECTOMY N/A 08/04/2013   Procedure: ROBOTIC ASSISTED LAPAROSCOPIC RADICAL PROSTATECTOMY LEVEL 2;  Surgeon: Dutch Gray, MD;  Location: [facility];  Service: Urology;  Laterality: N/A;  . SPINE SURGERY     cervical disc removed and stabalized with bone- good mobility   Past Medical History:  Diagnosis Date  . Cancer [facility])    prostate  . Depression   . Hearing loss   . History of kidney stones   . HOH (hard of hearing)   . Hyperlipidemia   . Hypertension   . Neuromuscular disorder (HCC)    BP (!) 145/92 (BP Location: Right Arm, Patient Position: Sitting, Cuff Size: Normal)   Pulse (!) 55   Resp 14   SpO2 96%   Opioid Risk Score:  Fall Risk Score:  `1  Depression screen PHQ 2/9  Depression screen [facility] 2/9 12/12/2014 08/28/2014  Decreased Interest 0 2  Down, Depressed, Hopeless 0 1  PHQ - 2 Score 0 3  Altered sleeping - 1  Tired, decreased energy - 2  Change in appetite - 0  Feeling bad or failure about yourself  - 0  Trouble concentrating - 1  Moving slowly or fidgety/restless - 0  Suicidal thoughts - 0  PHQ-9 Score - 7    Review of Systems  Constitutional: Negative.   Eyes: Negative.   Respiratory: Negative.   Cardiovascular: Negative.   Gastrointestinal: Negative.   Endocrine: Negative.   Musculoskeletal: Positive for arthralgias, back pain, myalgias, neck pain and neck stiffness.  Skin: Negative.   Neurological: Positive for  numbness.  Hematological: Negative.   Psychiatric/Behavioral: Positive for dysphoric mood.  All other systems reviewed and are negative.      Objective:   Physical Exam  Constitutional: He is oriented to person, place, and time. He appears well-developed and well-nourished.  HENT:  Head: Normocephalic and atraumatic.  Eyes: Conjunctivae and EOM are normal. Pupils are equal, round, and reactive to light.  Neck: Normal range of motion.  Neurological: He is alert and oriented to person, place, and time. He has normal strength. Coordination and gait normal.  Motor strength is 5/5 bilateral deltoid, bicep, tricep, grip, hip flexor, knee extensor, ankle dorsiflexor  Gait is without evidence of toe drag or knee instability  Psychiatric: He has a normal mood and affect.  Nursing note and vitals reviewed.         Assessment & Plan:  1. Cervical postlaminectomy syndrome, status post C4-C7 fusion, he has had chronic pain in the cervical area as well as the trapezius area. Shoulders have become a little bit more painful recently. X-rays show no significant joint arthritis. We discussed medication management, we'll discontinue Tylenol with Codeine trial/resume hydrocodone 5 mg 3 times a day  Physical therapy for myofascial pain.  2. Chronic low back pain, prior history of L2 compression fracture, no recent fall, we discussed obtaining another lumbar spine x-ray, which she declines at the current time.  Return to clinic one month with nurse practitioner  Continue opioid monitoring program. This consists of regular clinic visits, examinations, urine drug screen, pill counts as well as use of [place] controlled substance reporting System.

## 2016-10-21 ENCOUNTER — Ambulatory Visit (HOSPITAL_BASED_OUTPATIENT_CLINIC_OR_DEPARTMENT_OTHER): Payer: Medicare Other | Admitting: Physical Medicine & Rehabilitation

## 2016-10-21 ENCOUNTER — Encounter: Payer: Self-pay | Admitting: Physical Medicine & Rehabilitation

## 2016-10-21 ENCOUNTER — Encounter: Payer: Medicare Other | Attending: Physical Medicine & Rehabilitation

## 2016-10-21 VITALS — BP 154/103 | HR 61 | Resp 14

## 2016-10-21 DIAGNOSIS — Z823 Family history of stroke: Secondary | ICD-10-CM | POA: Insufficient documentation

## 2016-10-21 DIAGNOSIS — Z79899 Other long term (current) drug therapy: Secondary | ICD-10-CM

## 2016-10-21 DIAGNOSIS — Z8249 Family history of ischemic heart disease and other diseases of the circulatory system: Secondary | ICD-10-CM | POA: Insufficient documentation

## 2016-10-21 DIAGNOSIS — M961 Postlaminectomy syndrome, not elsewhere classified: Secondary | ICD-10-CM

## 2016-10-21 DIAGNOSIS — M25511 Pain in right shoulder: Secondary | ICD-10-CM | POA: Diagnosis not present

## 2016-10-21 DIAGNOSIS — Z87891 Personal history of nicotine dependence: Secondary | ICD-10-CM | POA: Diagnosis not present

## 2016-10-21 DIAGNOSIS — E785 Hyperlipidemia, unspecified: Secondary | ICD-10-CM | POA: Insufficient documentation

## 2016-10-21 DIAGNOSIS — M25512 Pain in left shoulder: Secondary | ICD-10-CM | POA: Diagnosis not present

## 2016-10-21 DIAGNOSIS — G894 Chronic pain syndrome: Secondary | ICD-10-CM

## 2016-10-21 DIAGNOSIS — G8929 Other chronic pain: Secondary | ICD-10-CM | POA: Diagnosis not present

## 2016-10-21 DIAGNOSIS — Z5181 Encounter for therapeutic drug level monitoring: Secondary | ICD-10-CM

## 2016-10-21 DIAGNOSIS — H9190 Unspecified hearing loss, unspecified ear: Secondary | ICD-10-CM | POA: Insufficient documentation

## 2016-10-21 DIAGNOSIS — M542 Cervicalgia: Secondary | ICD-10-CM | POA: Insufficient documentation

## 2016-10-21 DIAGNOSIS — I10 Essential (primary) hypertension: Secondary | ICD-10-CM | POA: Diagnosis not present

## 2016-10-21 DIAGNOSIS — Z87442 Personal history of urinary calculi: Secondary | ICD-10-CM | POA: Diagnosis not present

## 2016-10-21 DIAGNOSIS — Z981 Arthrodesis status: Secondary | ICD-10-CM | POA: Diagnosis not present

## 2016-10-21 DIAGNOSIS — Z841 Family history of disorders of kidney and ureter: Secondary | ICD-10-CM | POA: Insufficient documentation

## 2016-10-21 MED ORDER — OXYCODONE HCL 5 MG PO TABS: 5.0000 mg | ORAL_TABLET | Freq: Four times a day (QID) | ORAL | 0 refills | Status: DC | PRN

## 2016-10-21 NOTE — Progress Notes (Signed)
Subjective:    Patient ID: Fred [name], male    DOB: 11/20/1950, 66 y.o.   MRN: [id number]  HPI  CC:  Neck and shoulder pain Hydrocodone is working better than Tylenol with codeine but tier is #3 vs #2 for oxycodone Using aleve for shoulder pain, twice a day, cautioned to use only once a day  Pain Inventory Average Pain 4 Pain Right Now 4 My pain is tingling and aching  In the last 24 hours, has pain interfered with the following? General activity 6 Relation with others 5 Enjoyment of life 6 What TIME of day is your pain at its worst? daytime, evening Sleep (in general) Fair  Pain is worse with: walking, bending, sitting and standing Pain improves with: medication Relief from Meds: 6  Mobility walk without assistance how many minutes can you walk? 10-15 ability to climb steps?  yes do you drive?  yes  Function retired  Neuro/Psych bladder control problems numbness loss of taste or smell  Prior Studies Any changes since last visit?  no  Physicians involved in your care Any changes since last visit?  no   Family History  Problem Relation Age of Onset  . Stroke Mother   . Heart disease Father   . Stroke Father        heat stroke  . Kidney disease Brother    Social History   Social History  . Marital status: Married    Spouse name: N/A  . Number of children: N/A  . Years of education: N/A   Social History Main Topics  . Smoking status: Former Smoker    Packs/day: 1.00    Years: 30.00    Types: Cigarettes    Quit date: 05/25/2001  . Smokeless tobacco: Former Systems developer    Quit date: 09/22/2001  . Alcohol use No  . Drug use: No  . Sexual activity: Not Asked   Other Topics Concern  . None   Social History Narrative  . None   Past Surgical History:  Procedure Laterality Date  . BRAIN SURGERY     inserted rod to connect to BaHa(Hearing device)  . EYE SURGERY     lasik  . LYMPHADENECTOMY Bilateral 08/04/2013   Procedure: LYMPHADENECTOMY;   Surgeon: Dutch Gray, MD;  Location: [facility];  Service: Urology;  Laterality: Bilateral;  . ROBOT ASSISTED LAPAROSCOPIC RADICAL PROSTATECTOMY N/A 08/04/2013   Procedure: ROBOTIC ASSISTED LAPAROSCOPIC RADICAL PROSTATECTOMY LEVEL 2;  Surgeon: Dutch Gray, MD;  Location: [facility];  Service: Urology;  Laterality: N/A;  . SPINE SURGERY     cervical disc removed and stabalized with bone- good mobility   Past Medical History:  Diagnosis Date  . Cancer [facility])    prostate  . Depression   . Hearing loss   . History of kidney stones   . HOH (hard of hearing)   . Hyperlipidemia   . Hypertension   . Neuromuscular disorder (HCC)    BP (!) 154/103 (BP Location: Left Arm, Patient Position: Sitting, Cuff Size: Normal)   Pulse 61   Resp 14   SpO2 96%   Opioid Risk Score:   Fall Risk Score:  `1  Depression screen PHQ 2/9  Depression screen [facility] 2/9 12/12/2014 08/28/2014  Decreased Interest 0 2  Down, Depressed, Hopeless 0 1  PHQ - 2 Score 0 3  Altered sleeping - 1  Tired, decreased energy - 2  Change in appetite - 0  Feeling bad or failure about yourself  -  0  Trouble concentrating - 1  Moving slowly or fidgety/restless - 0  Suicidal thoughts - 0  PHQ-9 Score - 7    Review of Systems  Constitutional: Negative.   HENT: Negative.   Eyes: Negative.   Cardiovascular: Negative.   Gastrointestinal: Negative.   Endocrine: Negative.   Genitourinary: Positive for difficulty urinating.  Musculoskeletal: Positive for arthralgias, back pain, gait problem, myalgias and neck pain.  Skin: Negative.   Allergic/Immunologic: Negative.   Neurological: Positive for numbness.  Hematological: Negative.   Psychiatric/Behavioral: Negative.   All other systems reviewed and are negative.      Objective:   Physical Exam  Constitutional: He is oriented to person, place, and time. He appears well-developed and well-nourished.  HENT:  Head: Normocephalic and atraumatic.  Eyes: Conjunctivae are normal. Pupils  are equal, round, and reactive to light.  Neck: Normal range of motion.  Musculoskeletal:       Cervical back: He exhibits tenderness. He exhibits normal range of motion.       Lumbar back: He exhibits decreased range of motion. He exhibits no tenderness.  Negative straight leg raising test  Positive impingement sign. Bilateral shoulder  Neurological: He is alert and oriented to person, place, and time. Gait normal.  Strength is 5/5 bilateral deltoid, biceps  Psychiatric: He has a normal mood and affect. His behavior is normal. Judgment and thought content normal.  Nursing note and vitals reviewed.         Assessment & Plan:  1. Cervical postlaminectomy syndrome. No evidence of radiculopathy at the current time.  2. Chronic shoulder pain, likely rotator cuff syndrome, recent shoulder x-ray negative for arthritic changes. May benefit from subacromial bursa injection. Patient will hold off for now and discussed with nurse practitioner in one month. If he feels like he needs assist.  3. Chronic pain syndrome, multifactorial. Continue hydrocodone 5 mg 3 times a day Continue opioid monitoring program. This consists of regular clinic visits, examinations, urine drug screen, pill counts as well as use of [place] controlled substance reporting System.

## 2016-10-21 NOTE — Patient Instructions (Signed)
May try tylenol in place of aleve

## 2016-10-24 LAB — TOXASSURE SELECT,+ANTIDEPR,UR

## 2016-10-27 ENCOUNTER — Telehealth: Payer: Self-pay | Admitting: *Deleted

## 2016-10-27 NOTE — Telephone Encounter (Signed)
Urine drug screen for this encounter is consistent for prescribed medication hydrocodone.  He was changed at this encounter to oxycodone.

## 2016-11-18 ENCOUNTER — Encounter: Payer: Self-pay | Admitting: Registered Nurse

## 2016-11-18 ENCOUNTER — Telehealth: Payer: Self-pay | Admitting: Registered Nurse

## 2016-11-18 ENCOUNTER — Encounter: Payer: Medicare Other | Attending: Registered Nurse | Admitting: Registered Nurse

## 2016-11-18 VITALS — BP 137/91 | HR 60 | Resp 14

## 2016-11-18 DIAGNOSIS — Z8249 Family history of ischemic heart disease and other diseases of the circulatory system: Secondary | ICD-10-CM | POA: Diagnosis not present

## 2016-11-18 DIAGNOSIS — Z841 Family history of disorders of kidney and ureter: Secondary | ICD-10-CM | POA: Diagnosis not present

## 2016-11-18 DIAGNOSIS — M545 Low back pain: Secondary | ICD-10-CM | POA: Diagnosis not present

## 2016-11-18 DIAGNOSIS — Z981 Arthrodesis status: Secondary | ICD-10-CM | POA: Diagnosis not present

## 2016-11-18 DIAGNOSIS — M25512 Pain in left shoulder: Secondary | ICD-10-CM | POA: Diagnosis not present

## 2016-11-18 DIAGNOSIS — Z87891 Personal history of nicotine dependence: Secondary | ICD-10-CM | POA: Diagnosis not present

## 2016-11-18 DIAGNOSIS — M25511 Pain in right shoulder: Secondary | ICD-10-CM | POA: Insufficient documentation

## 2016-11-18 DIAGNOSIS — M542 Cervicalgia: Secondary | ICD-10-CM | POA: Diagnosis not present

## 2016-11-18 DIAGNOSIS — Z823 Family history of stroke: Secondary | ICD-10-CM | POA: Insufficient documentation

## 2016-11-18 DIAGNOSIS — Z5181 Encounter for therapeutic drug level monitoring: Secondary | ICD-10-CM | POA: Diagnosis not present

## 2016-11-18 DIAGNOSIS — Z87442 Personal history of urinary calculi: Secondary | ICD-10-CM | POA: Insufficient documentation

## 2016-11-18 DIAGNOSIS — E785 Hyperlipidemia, unspecified: Secondary | ICD-10-CM | POA: Diagnosis not present

## 2016-11-18 DIAGNOSIS — M7918 Myalgia, other site: Secondary | ICD-10-CM

## 2016-11-18 DIAGNOSIS — M961 Postlaminectomy syndrome, not elsewhere classified: Secondary | ICD-10-CM | POA: Diagnosis not present

## 2016-11-18 DIAGNOSIS — G894 Chronic pain syndrome: Secondary | ICD-10-CM | POA: Diagnosis not present

## 2016-11-18 DIAGNOSIS — G8929 Other chronic pain: Secondary | ICD-10-CM

## 2016-11-18 DIAGNOSIS — Z79899 Other long term (current) drug therapy: Secondary | ICD-10-CM | POA: Diagnosis not present

## 2016-11-18 DIAGNOSIS — I10 Essential (primary) hypertension: Secondary | ICD-10-CM | POA: Insufficient documentation

## 2016-11-18 DIAGNOSIS — M791 Myalgia: Secondary | ICD-10-CM | POA: Diagnosis not present

## 2016-11-18 DIAGNOSIS — H9190 Unspecified hearing loss, unspecified ear: Secondary | ICD-10-CM | POA: Insufficient documentation

## 2016-11-18 MED ORDER — OXYCODONE HCL 5 MG PO TABS: 5.0000 mg | ORAL_TABLET | Freq: Four times a day (QID) | ORAL | 0 refills | Status: DC | PRN

## 2016-11-18 NOTE — Telephone Encounter (Signed)
On 11/18/2016 the [place] was reviewed no conflict was seen on the [place] with multiple prescribers. [name] has a signed narcotic contract with our office. If there were any discrepancies this would have been reported to his physician.

## 2016-11-18 NOTE — Progress Notes (Signed)
Subjective:    Patient ID: Fred [name], male    DOB: June 28, 1950, 66 y.o.   MRN: [id number]  HPI: [name] is a 66 year old male who returns for follow up for chronic pain and medication refill. He states his pain is located in his neck radiating into his bilateral shoulder's and lower back.  He rates his pain 3. His current exercise regime is walking and performing stretching exercises.  Last UDS was performed on 10/21/2016, it was consistent.  Pain Inventory Average Pain 3 Pain Right Now 3 My pain is sharp, stabbing, tingling and aching  In the last 24 hours, has pain interfered with the following? General activity 3 Relation with others 4 Enjoyment of life 3 What TIME of day is your pain at its worst? evening Sleep (in general) Good  Pain is worse with: walking and sitting Pain improves with: rest, heat/ice, pacing activities and medication Relief from Meds: 7  Mobility walk without assistance ability to climb steps?  yes do you drive?  yes  Function disabled: date disabled 2001  Neuro/Psych bladder control problems numbness loss of taste or smell  Prior Studies Any changes since last visit?  no  Physicians involved in your care Any changes since last visit?  no   Family History  Problem Relation Age of Onset  . Stroke Mother   . Heart disease Father   . Stroke Father        heat stroke  . Kidney disease Brother    Social History   Social History  . Marital status: Married    Spouse name: N/A  . Number of children: N/A  . Years of education: N/A   Social History Main Topics  . Smoking status: Former Smoker    Packs/day: 1.00    Years: 30.00    Types: Cigarettes    Quit date: 05/25/2001  . Smokeless tobacco: Former Systems developer    Quit date: 09/22/2001  . Alcohol use No  . Drug use: No  . Sexual activity: Not Asked   Other Topics Concern  . None   Social History Narrative  . None   Past Surgical History:  Procedure Laterality  Date  . BRAIN SURGERY     inserted rod to connect to BaHa(Hearing device)  . EYE SURGERY     lasik  . LYMPHADENECTOMY Bilateral 08/04/2013   Procedure: LYMPHADENECTOMY;  Surgeon: Dutch Gray, MD;  Location: [facility];  Service: Urology;  Laterality: Bilateral;  . ROBOT ASSISTED LAPAROSCOPIC RADICAL PROSTATECTOMY N/A 08/04/2013   Procedure: ROBOTIC ASSISTED LAPAROSCOPIC RADICAL PROSTATECTOMY LEVEL 2;  Surgeon: Dutch Gray, MD;  Location: [facility];  Service: Urology;  Laterality: N/A;  . SPINE SURGERY     cervical disc removed and stabalized with bone- good mobility   Past Medical History:  Diagnosis Date  . Cancer [facility])    prostate  . Depression   . Hearing loss   . History of kidney stones   . HOH (hard of hearing)   . Hyperlipidemia   . Hypertension   . Neuromuscular disorder (HCC)    BP (!) 137/91   Pulse (!) 54   Resp 14   SpO2 97%   Opioid Risk Score:   Fall Risk Score:  `1  Depression screen PHQ 2/9  Depression screen [facility] 2/9 12/12/2014 08/28/2014  Decreased Interest 0 2  Down, Depressed, Hopeless 0 1  PHQ - 2 Score 0 3  Altered sleeping - 1  Tired, decreased energy -  2  Change in appetite - 0  Feeling bad or failure about yourself  - 0  Trouble concentrating - 1  Moving slowly or fidgety/restless - 0  Suicidal thoughts - 0  PHQ-9 Score - 7      Review of Systems  Constitutional: Negative.   HENT: Negative.   Eyes: Negative.   Respiratory: Negative.   Cardiovascular: Negative.   Endocrine: Negative.   Genitourinary: Negative.   Allergic/Immunologic: Negative.   Neurological: Negative.   Hematological: Negative.   Psychiatric/Behavioral: Negative.   All other systems reviewed and are negative.      Objective:   Physical Exam  Constitutional: He is oriented to person, place, and time. He appears well-developed and well-nourished.  HENT:  Head: Normocephalic and atraumatic.  Neck: Normal range of motion. Neck supple.  Cervical Paraspinal Tenderness:  C-5-C-6   Cardiovascular: Normal rate and regular rhythm.   Pulmonary/Chest: Effort normal and breath sounds normal.  Musculoskeletal:  Normal Muscle Bulk and Muscle Testing Reveals: Upper Extremities: Full ROM and Muscle Strength 5/5 Bilateral AC Joint Tenderness Thoracic Paraspinal Tenderness: T-1-T-3 Lumbar Paraspinal Tenderness: L-3-L-5 Lower Extremities: Full ROM and Muscle Strength 5/5 Arises from Table with ease Narrow Based Gait    Neurological: He is alert and oriented to person, place, and time.  Skin: Skin is warm and dry.  Psychiatric: He has a normal mood and affect.  Nursing note and vitals reviewed.         Assessment & Plan:  1.Cervical Postlaminectomy: Continue HEP and Continue current medication regime . 2. Chronic Shoulder Pain: Continue HEP as Tolerated  3. L2 compression fracture:  Refilled: Oxycodone 5 mg   one tablet every 6 hours as needed for moderate pain. # 90. We will Continue the  opioid monitoring program. This consists of regular clinic visits, examinations, urine drug screen, pill counts as well as use of [place] controlled substance reporting System.  20 minutes of face to face patient care time was spent during this visit. All questions were encouraged and answered.   F/U in 1 month

## 2016-12-19 ENCOUNTER — Encounter (INDEPENDENT_AMBULATORY_CARE_PROVIDER_SITE_OTHER): Payer: Self-pay

## 2016-12-19 ENCOUNTER — Encounter: Payer: Self-pay | Admitting: Registered Nurse

## 2016-12-19 ENCOUNTER — Encounter: Payer: Medicare Other | Attending: Registered Nurse | Admitting: Registered Nurse

## 2016-12-19 VITALS — BP 132/86 | HR 68

## 2016-12-19 DIAGNOSIS — Z8249 Family history of ischemic heart disease and other diseases of the circulatory system: Secondary | ICD-10-CM | POA: Diagnosis not present

## 2016-12-19 DIAGNOSIS — Z87891 Personal history of nicotine dependence: Secondary | ICD-10-CM | POA: Diagnosis not present

## 2016-12-19 DIAGNOSIS — M542 Cervicalgia: Secondary | ICD-10-CM | POA: Diagnosis not present

## 2016-12-19 DIAGNOSIS — Z981 Arthrodesis status: Secondary | ICD-10-CM | POA: Diagnosis not present

## 2016-12-19 DIAGNOSIS — M25512 Pain in left shoulder: Secondary | ICD-10-CM | POA: Insufficient documentation

## 2016-12-19 DIAGNOSIS — Z79899 Other long term (current) drug therapy: Secondary | ICD-10-CM

## 2016-12-19 DIAGNOSIS — M25511 Pain in right shoulder: Secondary | ICD-10-CM | POA: Diagnosis not present

## 2016-12-19 DIAGNOSIS — Z5181 Encounter for therapeutic drug level monitoring: Secondary | ICD-10-CM | POA: Diagnosis not present

## 2016-12-19 DIAGNOSIS — Z841 Family history of disorders of kidney and ureter: Secondary | ICD-10-CM | POA: Diagnosis not present

## 2016-12-19 DIAGNOSIS — Z87442 Personal history of urinary calculi: Secondary | ICD-10-CM | POA: Insufficient documentation

## 2016-12-19 DIAGNOSIS — M545 Low back pain, unspecified: Secondary | ICD-10-CM

## 2016-12-19 DIAGNOSIS — E785 Hyperlipidemia, unspecified: Secondary | ICD-10-CM | POA: Diagnosis not present

## 2016-12-19 DIAGNOSIS — I10 Essential (primary) hypertension: Secondary | ICD-10-CM | POA: Diagnosis not present

## 2016-12-19 DIAGNOSIS — Z823 Family history of stroke: Secondary | ICD-10-CM | POA: Diagnosis not present

## 2016-12-19 DIAGNOSIS — H9190 Unspecified hearing loss, unspecified ear: Secondary | ICD-10-CM | POA: Diagnosis not present

## 2016-12-19 DIAGNOSIS — G894 Chronic pain syndrome: Secondary | ICD-10-CM | POA: Diagnosis not present

## 2016-12-19 DIAGNOSIS — G8929 Other chronic pain: Secondary | ICD-10-CM | POA: Diagnosis not present

## 2016-12-19 DIAGNOSIS — M961 Postlaminectomy syndrome, not elsewhere classified: Secondary | ICD-10-CM

## 2016-12-19 MED ORDER — OXYCODONE HCL 5 MG PO TABS: 5.0000 mg | ORAL_TABLET | Freq: Four times a day (QID) | ORAL | 0 refills | Status: DC | PRN

## 2016-12-19 NOTE — Progress Notes (Signed)
Subjective:    Patient ID: Fred [name], male    DOB: 12-30-1950, 66 y.o.   MRN: [id number]  HPI: [name] is a 66 year old male who returns for follow up for chronic pain and medication refill. He states his pain is located in his neck radiating into his bilateral shoulder's, mid- lower back.  He rates his pain 3. His current exercise regime is walking,performing stretching exercises and ball therapy. .  Last UDS was performed on 10/21/2016, it was consistent.  Pain Inventory Average Pain 4 Pain Right Now 3 My pain is sharp, tingling and aching  In the last 24 hours, has pain interfered with the following? General activity 3 Relation with others 4 Enjoyment of life 3 What TIME of day is your pain at its worst? evening Sleep (in general) Fair  Pain is worse with: walking, bending and standing Pain improves with: medication Relief from Meds: 5  Mobility walk without assistance ability to climb steps?  yes do you drive?  no  Function I need assistance with the following:  .  Neuro/Psych bladder control problems numbness tingling loss of taste or smell  Prior Studies Any changes since last visit?  no  Physicians involved in your care Any changes since last visit?  no   Family History  Problem Relation Age of Onset  . Stroke Mother   . Heart disease Father   . Stroke Father        heat stroke  . Kidney disease Brother    Social History   Social History  . Marital status: Married    Spouse name: N/A  . Number of children: N/A  . Years of education: N/A   Social History Main Topics  . Smoking status: Former Smoker    Packs/day: 1.00    Years: 30.00    Types: Cigarettes    Quit date: 05/25/2001  . Smokeless tobacco: Former Systems developer    Quit date: 09/22/2001  . Alcohol use No  . Drug use: No  . Sexual activity: Not on file   Other Topics Concern  . Not on file   Social History Narrative  . No narrative on file   Past Surgical History:    Procedure Laterality Date  . BRAIN SURGERY     inserted rod to connect to BaHa(Hearing device)  . EYE SURGERY     lasik  . LYMPHADENECTOMY Bilateral 08/04/2013   Procedure: LYMPHADENECTOMY;  Surgeon: Dutch Gray, MD;  [facility];  Service: Urology;  Laterality: Bilateral;  . ROBOT ASSISTED LAPAROSCOPIC RADICAL PROSTATECTOMY N/A 08/04/2013   Procedure: ROBOTIC ASSISTED LAPAROSCOPIC RADICAL PROSTATECTOMY LEVEL 2;  Surgeon: Dutch Gray, MD;  [facility];  Service: Urology;  Laterality: N/A;  . SPINE SURGERY     cervical disc removed and stabalized with bone- good mobility   Past Medical History:  Diagnosis Date  . Cancer [facility])    prostate  . Depression   . Hearing loss   . History of kidney stones   . HOH (hard of hearing)   . Hyperlipidemia   . Hypertension   . Neuromuscular disorder ([place])    There were no vitals taken for this visit.  Opioid Risk Score:   Fall Risk Score:  `1  Depression screen PHQ 2/9  Depression screen [facility] 2/9 12/12/2014 08/28/2014  Decreased Interest 0 2  Down, Depressed, Hopeless 0 1  PHQ - 2 Score 0 3  Altered sleeping - 1  Tired, decreased energy -  2  Change in appetite - 0  Feeling bad or failure about yourself  - 0  Trouble concentrating - 1  Moving slowly or fidgety/restless - 0  Suicidal thoughts - 0  PHQ-9 Score - 7     Review of Systems  Constitutional: Negative.   HENT: Negative.   Eyes: Negative.   Respiratory: Negative.   Cardiovascular: Negative.   Gastrointestinal: Negative.   Endocrine: Negative.   Genitourinary: Negative.   Musculoskeletal: Negative.   Skin: Negative.   Allergic/Immunologic: Negative.   Neurological: Negative.   Hematological: Negative.   Psychiatric/Behavioral: Negative.   All other systems reviewed and are negative.      Objective:   Physical Exam  Constitutional: He is oriented to person, place, and time. He appears well-developed and well-nourished.  HENT:  Head: Normocephalic and  atraumatic.  Neck: Normal range of motion. Neck supple.  Cervical Paraspinal Tenderness: C-5-C-6  Cardiovascular: Normal rate and regular rhythm.   Pulmonary/Chest: Effort normal and breath sounds normal.  Musculoskeletal:  Normal Muscle Bulk and Muscle Testing Reveals: Upper Extremities: Full ROM and Muscle Strength 5/5 Left AC Joint Tenderness Thoracic Paraspinal Tenderness: T-1-T-3 Lower Extremities: Full ROM and Muscle Strength 5/5 Arises from Table with ease Narrow Based Gait  Neurological: He is alert and oriented to person, place, and time.  Skin: Skin is warm and dry.  Psychiatric: He has a normal mood and affect.  Nursing note and vitals reviewed.         Assessment & Plan:  1.Cervical Postlaminectomy: Continue HEP and Continue current medication regime . 12/19/2016 2. Chronic Bilateral Shoulder Pain: Continue HEP as Tolerated. 12/19/2016 3. L2 compression fracture: 12/19/2016 Refilled: Oxycodone 5 mg   one tablet every 6 hours as needed for moderate pain. # 90. We will Continue the  opioid monitoring program. This consists of regular clinic visits, examinations, urine drug screen, pill counts as well as use of [place] controlled substance reporting System.  20 minutes of face to face patient care time was spent during this visit. All questions were encouraged and answered.  F/U in 1 month

## 2017-01-22 ENCOUNTER — Encounter: Payer: Self-pay | Admitting: Registered Nurse

## 2017-01-22 ENCOUNTER — Encounter: Payer: Medicare Other | Attending: Registered Nurse | Admitting: Registered Nurse

## 2017-01-22 VITALS — BP 130/86 | HR 56

## 2017-01-22 DIAGNOSIS — Z5181 Encounter for therapeutic drug level monitoring: Secondary | ICD-10-CM

## 2017-01-22 DIAGNOSIS — Z841 Family history of disorders of kidney and ureter: Secondary | ICD-10-CM | POA: Diagnosis not present

## 2017-01-22 DIAGNOSIS — M961 Postlaminectomy syndrome, not elsewhere classified: Secondary | ICD-10-CM | POA: Diagnosis not present

## 2017-01-22 DIAGNOSIS — Z981 Arthrodesis status: Secondary | ICD-10-CM | POA: Diagnosis not present

## 2017-01-22 DIAGNOSIS — Z8249 Family history of ischemic heart disease and other diseases of the circulatory system: Secondary | ICD-10-CM | POA: Diagnosis not present

## 2017-01-22 DIAGNOSIS — M25512 Pain in left shoulder: Secondary | ICD-10-CM

## 2017-01-22 DIAGNOSIS — I10 Essential (primary) hypertension: Secondary | ICD-10-CM | POA: Insufficient documentation

## 2017-01-22 DIAGNOSIS — Z87442 Personal history of urinary calculi: Secondary | ICD-10-CM | POA: Insufficient documentation

## 2017-01-22 DIAGNOSIS — M545 Low back pain: Secondary | ICD-10-CM | POA: Diagnosis not present

## 2017-01-22 DIAGNOSIS — M791 Myalgia: Secondary | ICD-10-CM | POA: Diagnosis not present

## 2017-01-22 DIAGNOSIS — Z87891 Personal history of nicotine dependence: Secondary | ICD-10-CM | POA: Diagnosis not present

## 2017-01-22 DIAGNOSIS — G8929 Other chronic pain: Secondary | ICD-10-CM | POA: Insufficient documentation

## 2017-01-22 DIAGNOSIS — Z79899 Other long term (current) drug therapy: Secondary | ICD-10-CM | POA: Diagnosis not present

## 2017-01-22 DIAGNOSIS — M25511 Pain in right shoulder: Secondary | ICD-10-CM | POA: Diagnosis not present

## 2017-01-22 DIAGNOSIS — H9190 Unspecified hearing loss, unspecified ear: Secondary | ICD-10-CM | POA: Diagnosis not present

## 2017-01-22 DIAGNOSIS — M542 Cervicalgia: Secondary | ICD-10-CM | POA: Insufficient documentation

## 2017-01-22 DIAGNOSIS — E785 Hyperlipidemia, unspecified: Secondary | ICD-10-CM | POA: Diagnosis not present

## 2017-01-22 DIAGNOSIS — Z823 Family history of stroke: Secondary | ICD-10-CM | POA: Insufficient documentation

## 2017-01-22 DIAGNOSIS — M7918 Myalgia, other site: Secondary | ICD-10-CM

## 2017-01-22 MED ORDER — OXYCODONE HCL 5 MG PO TABS: 5.0000 mg | ORAL_TABLET | Freq: Four times a day (QID) | ORAL | 0 refills | Status: DC | PRN

## 2017-01-22 NOTE — Progress Notes (Signed)
Subjective:    Patient ID: Fred [name], male    DOB: February 09, 1951, 66 y.o.   MRN: [id number]  HPI: [name] is a 66year old male who returns for follow up for chronic pain and medication refill. He stateshis pain is located in his neck radiating into his bilateral shoulder's, mid- lower back. He rates his pain 3. His current exercise regime is walking,performing stretching exercises and ball therapy. .  Last UDS was performed on 10/21/2016, it was consistent.   Pain Inventory Average Pain 4 Pain Right Now 3 My pain is intermittent  In the last 24 hours, has pain interfered with the following? General activity 3 Relation with others 3 Enjoyment of life 2 What TIME of day is your pain at its worst? varies Sleep (in general) Good  Pain is worse with: walking, bending, sitting and standing Pain improves with: rest, heat/ice and medication Relief from Meds: 5  Mobility walk without assistance how many minutes can you walk? 10-15 ability to climb steps?  walk without assistance how many minutes can you walk? 10-15 ability to climb steps?  yes do you drive?  yes do you drive?  yes  Function not employed: date last employed .  Neuro/Psych bladder control problems numbness  Prior Studies Any changes since last visit?  no  Physicians involved in your care Any changes since last visit?  no   Family History  Problem Relation Age of Onset  . Stroke Mother   . Heart disease Father   . Stroke Father        heat stroke  . Kidney disease Brother    Social History   Social History  . Marital status: Married    Spouse name: N/A  . Number of children: N/A  . Years of education: N/A   Social History Main Topics  . Smoking status: Former Smoker    Packs/day: 1.00    Years: 30.00    Types: Cigarettes    Quit date: 05/25/2001  . Smokeless tobacco: Former Systems developer    Quit date: 09/22/2001  . Alcohol use No  . Drug use: No  . Sexual activity: Not Asked    Other Topics Concern  . None   Social History Narrative  . None   Past Surgical History:  Procedure Laterality Date  . BRAIN SURGERY     inserted rod to connect to BaHa(Hearing device)  . EYE SURGERY     lasik  . LYMPHADENECTOMY Bilateral 08/04/2013   Procedure: LYMPHADENECTOMY;  Surgeon: Fred Gray, MD;  [facility];  Service: Urology;  Laterality: Bilateral;  . ROBOT ASSISTED LAPAROSCOPIC RADICAL PROSTATECTOMY N/A 08/04/2013   Procedure: ROBOTIC ASSISTED LAPAROSCOPIC RADICAL PROSTATECTOMY LEVEL 2;  Surgeon: Fred Gray, MD;  [facility];  Service: Urology;  Laterality: N/A;  . SPINE SURGERY     cervical disc removed and stabalized with bone- good mobility   Past Medical History:  Diagnosis Date  . Cancer [name])    prostate  . Depression   . Hearing loss   . History of kidney stones   . HOH (hard of hearing)   . Hyperlipidemia   . Hypertension   . Neuromuscular disorder (HCC)    BP 130/86 (BP Location: Left Arm, Patient Position: Sitting, Cuff Size: Normal)   Pulse (!) 56   SpO2 97%   Opioid Risk Score:   Fall Risk Score:  `1  Depression screen PHQ 2/9  Depression screen [facility] 2/9 12/12/2014 08/28/2014  Decreased  Interest 0 2  Down, Depressed, Hopeless 0 1  PHQ - 2 Score 0 3  Altered sleeping - 1  Tired, decreased energy - 2  Change in appetite - 0  Feeling bad or failure about yourself  - 0  Trouble concentrating - 1  Moving slowly or fidgety/restless - 0  Suicidal thoughts - 0  PHQ-9 Score - 7   ] Review of Systems  Constitutional: Negative.   HENT: Negative.   Eyes: Negative.   Respiratory: Negative.   Cardiovascular: Negative.   Gastrointestinal: Negative.   Endocrine: Negative.   Genitourinary: Positive for difficulty urinating.  Musculoskeletal: Positive for arthralgias, back pain and neck pain.  Skin: Negative.   Allergic/Immunologic: Negative.   Neurological: Positive for numbness.  Hematological: Negative.   Psychiatric/Behavioral:  Negative.        Objective:   Physical Exam  Constitutional: He is oriented to person, place, and time. He appears well-developed and well-nourished.  HENT:  Head: Normocephalic and atraumatic.  Neck: Normal range of motion. Neck supple.  Cervical Paraspinal Tenderness: C-5-C-6  Cardiovascular: Normal rate and regular rhythm.   Pulmonary/Chest: Effort normal and breath sounds normal.  Musculoskeletal:  Normal Muscle Bulk and Muscle Testing Reveals: Upper Extremities: Full ROM and Muscle Strength 5/5 Bilateral AC Joint Tenderness Thoracic Paraspinal Tenderness: T-1-T-3  T-7-T-9 Lumbar Paraspinal Tenderness: L-4-L-5 Lower Extremities: Full ROM and Muscle Strength 5/5 Arises from Table with ease Narrow Based Gait   Neurological: He is alert and oriented to person, place, and time.  Skin: Skin is warm and dry.  Psychiatric: He has a normal mood and affect.  Nursing note and vitals reviewed.         Assessment & Plan:  1.Cervical Postlaminectomy: Continue HEP and Continue current medication regime . 01/22/2017 2. Chronic Bilateral Shoulder Pain: Continue HEP as Tolerated. 01/22/2017 3. L2 compression fracture: Continue to Monitor: 01/22/2017 Refilled:Oxycodone 5 mg one tablet every 6 hours as needed for moderate pain. # 90. We will Continue the opioid monitoring program. This consists of regular clinic visits, examinations, urine drug screen, pill counts as well as use of [place] controlled substance reporting System.  20 minutes of face to face patient care time was spent during this visit. All questions were encouraged and answered.  F/U in 12month

## 2017-02-19 ENCOUNTER — Telehealth: Payer: Self-pay | Admitting: Registered Nurse

## 2017-02-19 ENCOUNTER — Encounter: Payer: Self-pay | Admitting: Registered Nurse

## 2017-02-19 ENCOUNTER — Encounter: Payer: Medicare Other | Attending: Registered Nurse | Admitting: Registered Nurse

## 2017-02-19 VITALS — BP 124/81 | HR 58

## 2017-02-19 DIAGNOSIS — M25512 Pain in left shoulder: Secondary | ICD-10-CM | POA: Diagnosis not present

## 2017-02-19 DIAGNOSIS — M5412 Radiculopathy, cervical region: Secondary | ICD-10-CM | POA: Diagnosis not present

## 2017-02-19 DIAGNOSIS — Z79899 Other long term (current) drug therapy: Secondary | ICD-10-CM

## 2017-02-19 DIAGNOSIS — G894 Chronic pain syndrome: Secondary | ICD-10-CM | POA: Diagnosis not present

## 2017-02-19 DIAGNOSIS — Z87442 Personal history of urinary calculi: Secondary | ICD-10-CM | POA: Insufficient documentation

## 2017-02-19 DIAGNOSIS — M542 Cervicalgia: Secondary | ICD-10-CM | POA: Diagnosis not present

## 2017-02-19 DIAGNOSIS — Z8249 Family history of ischemic heart disease and other diseases of the circulatory system: Secondary | ICD-10-CM | POA: Diagnosis not present

## 2017-02-19 DIAGNOSIS — M961 Postlaminectomy syndrome, not elsewhere classified: Secondary | ICD-10-CM | POA: Diagnosis not present

## 2017-02-19 DIAGNOSIS — I10 Essential (primary) hypertension: Secondary | ICD-10-CM | POA: Diagnosis not present

## 2017-02-19 DIAGNOSIS — Z823 Family history of stroke: Secondary | ICD-10-CM | POA: Diagnosis not present

## 2017-02-19 DIAGNOSIS — M25511 Pain in right shoulder: Secondary | ICD-10-CM | POA: Diagnosis not present

## 2017-02-19 DIAGNOSIS — M545 Low back pain: Secondary | ICD-10-CM

## 2017-02-19 DIAGNOSIS — Z981 Arthrodesis status: Secondary | ICD-10-CM | POA: Diagnosis not present

## 2017-02-19 DIAGNOSIS — Z87891 Personal history of nicotine dependence: Secondary | ICD-10-CM | POA: Diagnosis not present

## 2017-02-19 DIAGNOSIS — Z5181 Encounter for therapeutic drug level monitoring: Secondary | ICD-10-CM | POA: Diagnosis not present

## 2017-02-19 DIAGNOSIS — H9190 Unspecified hearing loss, unspecified ear: Secondary | ICD-10-CM | POA: Insufficient documentation

## 2017-02-19 DIAGNOSIS — G8929 Other chronic pain: Secondary | ICD-10-CM | POA: Insufficient documentation

## 2017-02-19 DIAGNOSIS — E785 Hyperlipidemia, unspecified: Secondary | ICD-10-CM | POA: Diagnosis not present

## 2017-02-19 DIAGNOSIS — Z841 Family history of disorders of kidney and ureter: Secondary | ICD-10-CM | POA: Insufficient documentation

## 2017-02-19 MED ORDER — OXYCODONE HCL 5 MG PO TABS: 5.0000 mg | ORAL_TABLET | Freq: Four times a day (QID) | ORAL | 0 refills | Status: DC | PRN

## 2017-02-19 NOTE — Telephone Encounter (Signed)
On 02/19/2017 the [place] was reviewed no conflict was seen on the [place] with multiple prescribers. Fred [name] has a signed narcotic contract with our office. If there were any discrepancies this would have been reported to his physician.

## 2017-02-19 NOTE — Progress Notes (Signed)
Subjective:    Patient ID: Fred [name], male    DOB: 04-Oct-1950, 66 y.o.   MRN: [id number]  HPI: [name] is a 66year old male who returns for follow up for chronic pain and medication refill. He stateshis pain is located in his neck radiating into his bilateral shoulder's and lower back. He rates his pain 3. His current exercise regime is walking,performing stretching exercises and ball therapy.   [name] 29.35  Last UDS was performed on 10/21/2016, it was consistent.   Pain Inventory Average Pain 4 Pain Right Now 3 My pain is constant, tingling and aching  In the last 24 hours, has pain interfered with the following? General activity 3 Relation with others 3 Enjoyment of life 2 What TIME of day is your pain at its worst? all Sleep (in general) Fair  Pain is worse with: walking, bending, sitting, standing and some activites Pain improves with: rest, heat/ice and medication Relief from Meds: 5  Mobility how many minutes can you walk? 10-15 ability to climb steps?  yes do you drive?  yes  Function not employed: date last employed . disabled: date disabled .  Neuro/Psych bladder control problems numbness loss of taste or smell  Prior Studies Any changes since last visit?  no  Physicians involved in your care Any changes since last visit?  no   Family History  Problem Relation Age of Onset  . Stroke Mother   . Heart disease Father   . Stroke Father        heat stroke  . Kidney disease Brother    Social History   Social History  . Marital status: Married    Spouse name: N/A  . Number of children: N/A  . Years of education: N/A   Social History Main Topics  . Smoking status: Former Smoker    Packs/day: 1.00    Years: 30.00    Types: Cigarettes    Quit date: 05/25/2001  . Smokeless tobacco: Former Systems developer    Quit date: 09/22/2001  . Alcohol use No  . Drug use: No  . Sexual activity: Not on file   Other Topics Concern  .  Not on file   Social History Narrative  . No narrative on file   Past Surgical History:  Procedure Laterality Date  . BRAIN SURGERY     inserted rod to connect to BaHa(Hearing device)  . EYE SURGERY     lasik  . LYMPHADENECTOMY Bilateral 08/04/2013   Procedure: LYMPHADENECTOMY;  Surgeon: Dutch Gray, MD;  [facility];  Service: Urology;  Laterality: Bilateral;  . ROBOT ASSISTED LAPAROSCOPIC RADICAL PROSTATECTOMY N/A 08/04/2013   Procedure: ROBOTIC ASSISTED LAPAROSCOPIC RADICAL PROSTATECTOMY LEVEL 2;  Surgeon: Dutch Gray, MD;  [facility];  Service: Urology;  Laterality: N/A;  . SPINE SURGERY     cervical disc removed and stabalized with bone- good mobility   Past Medical History:  Diagnosis Date  . Cancer [facility])    prostate  . Depression   . Hearing loss   . History of kidney stones   . HOH (hard of hearing)   . Hyperlipidemia   . Hypertension   . Neuromuscular disorder ([place])    There were no vitals taken for this visit.  Opioid Risk Score:  1 Fall Risk Score:  `1  Depression screen PHQ 2/9  Depression screen [facility] 2/9 02/19/2017 12/12/2014 08/28/2014  Decreased Interest 0 0 2  Down, Depressed, Hopeless 0 0 1  PHQ - 2 Score 0 0 3  Altered sleeping - - 1  Tired, decreased energy - - 2  Change in appetite - - 0  Feeling bad or failure about yourself  - - 0  Trouble concentrating - - 1  Moving slowly or fidgety/restless - - 0  Suicidal thoughts - - 0  PHQ-9 Score - - 7   ] Review of Systems  Constitutional: Negative.   HENT: Negative.   Eyes: Negative.   Respiratory: Negative.   Cardiovascular: Negative.   Gastrointestinal: Negative.   Endocrine: Negative.   Genitourinary: Positive for difficulty urinating.  Musculoskeletal: Positive for arthralgias, back pain and neck pain.  Skin: Negative.   Allergic/Immunologic: Negative.   Neurological: Positive for numbness.  Hematological: Negative.   Psychiatric/Behavioral: Negative.        Objective:    Physical Exam  Constitutional: He is oriented to person, place, and time. He appears well-developed and well-nourished.  HENT:  Head: Normocephalic and atraumatic.  Neck: Normal range of motion. Neck supple.  Cervical Paraspinal Tenderness: C-5-C-6  Cardiovascular: Normal rate and regular rhythm.   Pulmonary/Chest: Effort normal and breath sounds normal.  Musculoskeletal:  Normal Muscle Bulk and Muscle Testing Reveals: Upper Extremities: Decreased ROM 90 Degrees  and Muscle Strength 4/5 Bilateral AC Joint Tenderness Lumbar Paraspinal Tenderness: L-4-L-5 Lower Extremities: Full ROM and Muscle Strength 5/5 Arises from Table with ease Narrow Based Gait   Neurological: He is alert and oriented to person, place, and time.  Skin: Skin is warm and dry.  Psychiatric: He has a normal mood and affect.  Nursing note and vitals reviewed.         Assessment & Plan:  1.Cervical Postlaminectomy: Continue HEP and Continue current medication regime . 02/19/2017 2. Cervicalgia/ Cervical Radiculitis: Continue current medication regime. Continue to Monitor. 02/19/2017 3. Chronic Bilateral Shoulder Pain: Continue HEP as Tolerated. 02/19/2017 4. L2 compression fracture/ Chronic Bilateral Low Back Pain without Sciatica: Continue to Monitor: 02/19/2017 Refilled:Oxycodone 5 mg one tablet every 6 hours as needed for moderate pain. # 90. We will Continue the opioid monitoring program. This consists of regular clinic visits, examinations, urine drug screen, pill counts as well as use of [place] controlled substance reporting System.  20 minutes of face to face patient care time was spent during this visit. All questions were encouraged and answered.  F/U in 51month

## 2017-03-19 ENCOUNTER — Encounter: Payer: Medicare Other | Attending: Registered Nurse | Admitting: Registered Nurse

## 2017-03-19 DIAGNOSIS — E785 Hyperlipidemia, unspecified: Secondary | ICD-10-CM | POA: Insufficient documentation

## 2017-03-19 DIAGNOSIS — H9190 Unspecified hearing loss, unspecified ear: Secondary | ICD-10-CM | POA: Insufficient documentation

## 2017-03-19 DIAGNOSIS — Z841 Family history of disorders of kidney and ureter: Secondary | ICD-10-CM | POA: Insufficient documentation

## 2017-03-19 DIAGNOSIS — Z8249 Family history of ischemic heart disease and other diseases of the circulatory system: Secondary | ICD-10-CM | POA: Insufficient documentation

## 2017-03-19 DIAGNOSIS — Z823 Family history of stroke: Secondary | ICD-10-CM | POA: Insufficient documentation

## 2017-03-19 DIAGNOSIS — Z87891 Personal history of nicotine dependence: Secondary | ICD-10-CM | POA: Insufficient documentation

## 2017-03-19 DIAGNOSIS — I10 Essential (primary) hypertension: Secondary | ICD-10-CM | POA: Insufficient documentation

## 2017-03-19 DIAGNOSIS — Z87442 Personal history of urinary calculi: Secondary | ICD-10-CM | POA: Insufficient documentation

## 2017-03-19 DIAGNOSIS — M25511 Pain in right shoulder: Secondary | ICD-10-CM | POA: Insufficient documentation

## 2017-03-19 DIAGNOSIS — M542 Cervicalgia: Secondary | ICD-10-CM | POA: Insufficient documentation

## 2017-03-19 DIAGNOSIS — Z981 Arthrodesis status: Secondary | ICD-10-CM | POA: Insufficient documentation

## 2017-03-19 DIAGNOSIS — M25512 Pain in left shoulder: Secondary | ICD-10-CM | POA: Insufficient documentation

## 2017-03-19 DIAGNOSIS — G8929 Other chronic pain: Secondary | ICD-10-CM | POA: Insufficient documentation

## 2017-05-08 ENCOUNTER — Encounter: Payer: Medicare Other | Attending: Registered Nurse | Admitting: Registered Nurse

## 2017-05-08 ENCOUNTER — Encounter (HOSPITAL_COMMUNITY): Payer: Self-pay

## 2017-05-08 ENCOUNTER — Other Ambulatory Visit: Payer: Self-pay

## 2017-05-08 ENCOUNTER — Encounter: Payer: Self-pay | Admitting: Registered Nurse

## 2017-05-08 ENCOUNTER — Emergency Department (HOSPITAL_COMMUNITY)
Admission: EM | Admit: 2017-05-08 | Discharge: 2017-05-08 | Disposition: A | Discharge status: Home / Self Care | Payer: Medicare Other | Attending: Emergency Medicine | Admitting: Emergency Medicine

## 2017-05-08 VITALS — BP 198/118 | HR 54 | Resp 14

## 2017-05-08 DIAGNOSIS — M5412 Radiculopathy, cervical region: Secondary | ICD-10-CM

## 2017-05-08 DIAGNOSIS — Z5181 Encounter for therapeutic drug level monitoring: Secondary | ICD-10-CM

## 2017-05-08 DIAGNOSIS — Z8546 Personal history of malignant neoplasm of prostate: Secondary | ICD-10-CM | POA: Diagnosis not present

## 2017-05-08 DIAGNOSIS — Z8249 Family history of ischemic heart disease and other diseases of the circulatory system: Secondary | ICD-10-CM | POA: Diagnosis not present

## 2017-05-08 DIAGNOSIS — Z79899 Other long term (current) drug therapy: Secondary | ICD-10-CM

## 2017-05-08 DIAGNOSIS — G894 Chronic pain syndrome: Secondary | ICD-10-CM

## 2017-05-08 DIAGNOSIS — Z841 Family history of disorders of kidney and ureter: Secondary | ICD-10-CM | POA: Diagnosis not present

## 2017-05-08 DIAGNOSIS — M545 Low back pain: Secondary | ICD-10-CM | POA: Diagnosis not present

## 2017-05-08 DIAGNOSIS — Z981 Arthrodesis status: Secondary | ICD-10-CM | POA: Diagnosis not present

## 2017-05-08 DIAGNOSIS — I10 Essential (primary) hypertension: Secondary | ICD-10-CM | POA: Insufficient documentation

## 2017-05-08 DIAGNOSIS — M542 Cervicalgia: Secondary | ICD-10-CM | POA: Diagnosis not present

## 2017-05-08 DIAGNOSIS — Z87891 Personal history of nicotine dependence: Secondary | ICD-10-CM | POA: Insufficient documentation

## 2017-05-08 DIAGNOSIS — M25511 Pain in right shoulder: Secondary | ICD-10-CM | POA: Insufficient documentation

## 2017-05-08 DIAGNOSIS — M961 Postlaminectomy syndrome, not elsewhere classified: Secondary | ICD-10-CM | POA: Diagnosis not present

## 2017-05-08 DIAGNOSIS — E785 Hyperlipidemia, unspecified: Secondary | ICD-10-CM | POA: Insufficient documentation

## 2017-05-08 DIAGNOSIS — M25512 Pain in left shoulder: Secondary | ICD-10-CM | POA: Insufficient documentation

## 2017-05-08 DIAGNOSIS — G8929 Other chronic pain: Secondary | ICD-10-CM

## 2017-05-08 DIAGNOSIS — Z823 Family history of stroke: Secondary | ICD-10-CM | POA: Diagnosis not present

## 2017-05-08 DIAGNOSIS — H9190 Unspecified hearing loss, unspecified ear: Secondary | ICD-10-CM | POA: Diagnosis not present

## 2017-05-08 DIAGNOSIS — Z87442 Personal history of urinary calculi: Secondary | ICD-10-CM | POA: Diagnosis not present

## 2017-05-08 LAB — BASIC METABOLIC PANEL
Anion gap: 7 (ref 5–15)
BUN: 10 mg/dL (ref 6–20)
CALCIUM: 9.1 mg/dL (ref 8.9–10.3)
CO2: 27 mmol/L (ref 22–32)
CREATININE: 1.19 mg/dL (ref 0.61–1.24)
Chloride: 103 mmol/L (ref 101–111)
GFR calc non Af Amer: >60 mL/min (ref >60)
Glucose, Bld: 97 mg/dL (ref 65–99)
Potassium: 4.3 mmol/L (ref 3.5–5.1)
SODIUM: 137 mmol/L (ref 135–145)

## 2017-05-08 LAB — URINALYSIS, ROUTINE W REFLEX MICROSCOPIC
Bacteria, UA: NONE SEEN
Bilirubin Urine: NEGATIVE
GLUCOSE, UA: 50 mg/dL — AB
Ketones, ur: NEGATIVE mg/dL
LEUKOCYTES UA: NEGATIVE
Nitrite: NEGATIVE
PROTEIN: NEGATIVE mg/dL
Specific Gravity, Urine: 1.001 — ABNORMAL LOW (ref 1.005–1.030)
Squamous Epithelial / LPF: NONE SEEN
pH: 6 (ref 5.0–8.0)

## 2017-05-08 LAB — CBC
HCT: 44.1 % (ref 39.0–52.0)
Hemoglobin: 14.9 g/dL (ref 13.0–17.0)
MCH: 29.4 pg (ref 26.0–34.0)
MCHC: 33.8 g/dL (ref 30.0–36.0)
MCV: 87 fL (ref 78.0–100.0)
PLATELETS: 198 10*3/uL (ref 150–400)
RBC: 5.07 MIL/uL (ref 4.22–5.81)
RDW: 13.6 % (ref 11.5–15.5)
WBC: 6.1 10*3/uL (ref 4.0–10.5)

## 2017-05-08 MED ORDER — OXYCODONE HCL 5 MG PO TABS: 5.0000 mg | ORAL_TABLET | Freq: Four times a day (QID) | ORAL | 0 refills | Status: DC | PRN

## 2017-05-08 MED ORDER — AMLODIPINE BESYLATE 5 MG PO TABS: 5.0000 mg | ORAL_TABLET | Freq: Every day | ORAL | 1 refills | Status: DC

## 2017-05-08 NOTE — Discharge Instructions (Signed)
See Dr. [name] for recheck.  Continue your blood pressure medications.

## 2017-05-08 NOTE — Progress Notes (Signed)
Subjective:    Patient ID: Fred [name], male    DOB: February 26, 1951, 66 y.o.   MRN: [id number]  HPI: [name] is a 66year old male who returns for follow up for chronic pain and medication refill, arrived hypertensive,states he is compliant with his antihypertensive. Mr. [name] reports he hasn't been feeling well for over a week, SOB and headache, he didn't call his PCP. . Mr. Mode refused ED evalution at the onset of visit and Urgent Care. Placed a call to his PCP Dr. [name] office they were closed spoke to the on call physcian Dr. [name] she agrees with the assessment he should be seen at the emergency department, discuss Dr. [name] recommendation with Mr. [name] and he agrees. He refuses EMS and states he will go to [facility] Emergency Department. He statedhis pain is located in his neck radiating into his bilateral shoulder's and lower back. Also states he has a headache. He rates his pain 4. His current exercise regime is walking,performing stretching exercises and ball therapy.   Mr. [name] MME 29.35  Last UDS was performed on 10/21/2016, it was consistent.   Pain Inventory Average Pain 3 Pain Right Now 4 My pain is constant, stabbing, tingling and aching  In the last 24 hours, has pain interfered with the following? General activity 3 Relation with others 3 Enjoyment of life 0 What TIME of day is your pain at its worst? all Sleep (in general) Fair  Pain is worse with: walking, bending, standing and some activites Pain improves with: rest, heat/ice and medication Relief from Meds: 5  Mobility walk without assistance how many minutes can you walk? 10-15 ability to climb steps?  yes do you drive?  yes  Function not employed: date last employed . disabled: date disabled .  Neuro/Psych bladder control problems numbness loss of taste or smell  Prior Studies Any changes since last visit?  no  Physicians involved in your care Any changes since  last visit?  no   Family History  Problem Relation Age of Onset  . Stroke Mother   . Heart disease Father   . Stroke Father        heat stroke  . Kidney disease Brother    Social History   Socioeconomic History  . Marital status: Married    Spouse name: None  . Number of children: None  . Years of education: None  . Highest education level: None  Social Needs  . Financial resource strain: None  . Food insecurity - worry: None  . Food insecurity - inability: None  . Transportation needs - medical: None  . Transportation needs - non-medical: None  Occupational History  . None  Tobacco Use  . Smoking status: Former Smoker    Packs/day: 1.00    Years: 30.00    Pack years: 30.00    Types: Cigarettes    Last attempt to quit: 05/25/2001    Years since quitting: 15.9  . Smokeless tobacco: Former Systems developer    Quit date: 09/22/2001  Substance and Sexual Activity  . Alcohol use: No  . Drug use: No  . Sexual activity: None  Other Topics Concern  . None  Social History Narrative  . None   Past Surgical History:  Procedure Laterality Date  . BRAIN SURGERY     inserted rod to connect to BaHa(Hearing device)  . EYE SURGERY     lasik  . LYMPHADENECTOMY Bilateral 08/04/2013   Procedure: LYMPHADENECTOMY;  Surgeon:  Dutch Gray, MD;  Location: [facility];  Service: Urology;  Laterality: Bilateral;  . ROBOT ASSISTED LAPAROSCOPIC RADICAL PROSTATECTOMY N/A 08/04/2013   Procedure: ROBOTIC ASSISTED LAPAROSCOPIC RADICAL PROSTATECTOMY LEVEL 2;  Surgeon: Dutch Gray, MD;  Location: [facility];  Service: Urology;  Laterality: N/A;  . SPINE SURGERY     cervical disc removed and stabalized with bone- good mobility   Past Medical History:  Diagnosis Date  . Cancer [facility])    prostate  . Depression   . Hearing loss   . History of kidney stones   . HOH (hard of hearing)   . Hyperlipidemia   . Hypertension   . Neuromuscular disorder (HCC)    BP (!) 199/129 (BP Location: Right Arm, Patient Position:  Sitting, Cuff Size: Normal)   Pulse (!) 54   Resp 14   SpO2 95%   Opioid Risk Score:  1 Fall Risk Score:  `1  Depression screen PHQ 2/9  Depression screen [facility] 2/9 02/19/2017 12/12/2014 08/28/2014  Decreased Interest 0 0 2  Down, Depressed, Hopeless 0 0 1  PHQ - 2 Score 0 0 3  Altered sleeping - - 1  Tired, decreased energy - - 2  Change in appetite - - 0  Feeling bad or failure about yourself  - - 0  Trouble concentrating - - 1  Moving slowly or fidgety/restless - - 0  Suicidal thoughts - - 0  PHQ-9 Score - - 7   ] Review of Systems  Constitutional: Negative.   HENT: Negative.   Eyes: Negative.   Respiratory: Negative.   Cardiovascular: Negative.   Gastrointestinal: Negative.   Endocrine: Negative.   Genitourinary: Positive for difficulty urinating.  Musculoskeletal: Positive for arthralgias, back pain and neck pain.  Skin: Negative.   Allergic/Immunologic: Negative.   Neurological: Positive for numbness.  Hematological: Negative.   Psychiatric/Behavioral: Negative.        Objective:   Physical Exam  Constitutional: He is oriented to person, place, and time. He appears well-developed and well-nourished.  HENT:  Head: Normocephalic and atraumatic.  Neck: Normal range of motion. Neck supple.  Cervical Paraspinal Tenderness: C-5-C-6  Cardiovascular: Normal rate and regular rhythm.  Pulmonary/Chest: Effort normal and breath sounds normal.  Musculoskeletal:  Normal Muscle Bulk and Muscle Testing Reveals: Upper Extremities: Full ROM and Muscle Strength 4/5 Bilateral AC Joint Tenderness Lumbar Paraspinal Tenderness: L-3-L-5 Lower Extremities: Full ROM and Muscle Strength 5/5 Arises from Table with ease Narrow Based Gait   Neurological: He is alert and oriented to person, place, and time.  Skin: Skin is warm and dry.  Psychiatric: He has a normal mood and affect.  Nursing note and vitals reviewed.         Assessment & Plan:  1. Uncontrolled Hypertension:  Mr. Achord refuses EMS due to financial hardship he states. He states " he will go to  [facility] Emergency Department. 05/08/2017 2.Cervical Postlaminectomy: Continue HEP and Continue current medication regime . 05/08/2017 3. Cervicalgia/ Cervical Radiculitis: Continue current medication regime. Continue to Monitor. 05/08/2017 4. Chronic Bilateral Shoulder Pain: Continue HEP as Tolerated. 05/08/2017 5. L2 compression fracture/ Chronic Bilateral Low Back Pain without Sciatica: Continue to Monitor: 07/09/2016 Refilled:Oxycodone 5 mg one tablet every 6 hours as needed for moderate pain. # 90. We will Continue the opioid monitoring program. This consists of regular clinic visits, examinations, urine drug screen, pill counts as well as use of [place] controlled substance reporting System.  20 minutes of face to face patient care time  was spent during this visit. All questions were encouraged and answered.  F/U in 28month

## 2017-05-08 NOTE — ED Triage Notes (Signed)
Pt states he has been feeling bad for approximately 1 week. Generalized fatigue and weakness. He reports he also has some URI symptoms. Pt AOX4. Reports some headache and dizziness. Denies headache presently.

## 2017-05-08 NOTE — ED Provider Notes (Signed)
[place] EMERGENCY DEPARTMENT Provider Note   CSN: [id number] Arrival date & time: 05/08/17  1103     History   Chief Complaint Chief Complaint  Patient presents with  . Hypertension    HPI Fred [name] is a 66 y.o. male.  The history is provided by the patient. No language interpreter was used.  Hypertension  This is a new problem. The current episode started 12 to 24 hours ago. The problem occurs constantly. The problem has been gradually worsening. Nothing aggravates the symptoms. Nothing relieves the symptoms. He has tried nothing for the symptoms. The treatment provided no relief.   Pt complains of blood pressure being high at his pain management clinic today.  Pt reports he has had some uri symptoms.  (Pt denies headache, weakness, confusion.)  No stroke like symptoms  Past Medical History:  Diagnosis Date  . Cancer [facility])    prostate  . Depression   . Hearing loss   . History of kidney stones   . HOH (hard of hearing)   . Hyperlipidemia   . Hypertension   . Neuromuscular disorder [facility])     Patient Active Problem List   Diagnosis Date Noted  . Chronic low back pain 11/13/2014  . Prostate cancer ([place]) 08/04/2013  . Compression fracture of L2 lumbar vertebra (HCC) 05/03/2013  . Cervical postlaminectomy syndrome 09/23/2011  . Cervical stenosis of spinal canal 09/23/2011    Past Surgical History:  Procedure Laterality Date  . BRAIN SURGERY     inserted rod to connect to BaHa(Hearing device)  . EYE SURGERY     lasik  . LYMPHADENECTOMY Bilateral 08/04/2013   Procedure: LYMPHADENECTOMY;  Surgeon: Dutch Gray, MD;  Location: [facility];  Service: Urology;  Laterality: Bilateral;  . ROBOT ASSISTED LAPAROSCOPIC RADICAL PROSTATECTOMY N/A 08/04/2013   Procedure: ROBOTIC ASSISTED LAPAROSCOPIC RADICAL PROSTATECTOMY LEVEL 2;  Surgeon: Dutch Gray, MD;  Location: [facility];  Service: Urology;  Laterality: N/A;  . SPINE SURGERY     cervical disc removed and  stabalized with bone- good mobility       Home Medications    Prior to Admission medications   Medication Sig Start Date End Date Taking? Authorizing Provider  amLODipine (NORVASC) 5 MG tablet Take 1 tablet (5 mg total) by mouth daily. 05/08/17   Fransico Meadow, PA-C  DULoxetine (CYMBALTA) 60 MG capsule Take 60 mg by mouth at bedtime.     [provider]  lisinopril-hydrochlorothiazide (PRINZIDE,ZESTORETIC) 20-12.5 MG per tablet Take 2 tablets by mouth every morning.    [provider]  oxyCODONE (OXY IR/ROXICODONE) 5 MG immediate release tablet Take 1 tablet (5 mg total) by mouth every 6 (six) hours as needed for severe pain. 05/08/17   Bayard Hugger, NP    Family History Family History  Problem Relation Age of Onset  . Stroke Mother   . Heart disease Father   . Stroke Father        heat stroke  . Kidney disease Brother     Social History Social History   Tobacco Use  . Smoking status: Former Smoker    Packs/day: 1.00    Years: 30.00    Pack years: 30.00    Types: Cigarettes    Last attempt to quit: 05/25/2001    Years since quitting: 15.9  . Smokeless tobacco: Former Systems developer    Quit date: 09/22/2001  Substance Use Topics  . Alcohol use: No  . Drug use: No  Allergies   Lyrica [pregabalin]; Neurontin [gabapentin]; and Percodan [oxycodone-aspirin]   Review of Systems Review of Systems  All other systems reviewed and are negative.    Physical Exam Updated Vital Signs BP (!) 196/114   Pulse (!) 57   Temp 97.7 F (36.5 C) (Oral)   Resp 14   Ht 5\' 8"  (1.727 m)   Wt 78.9 kg (174 lb)   SpO2 98%   BMI 26.46 kg/m   Physical Exam  Constitutional: He appears well-developed and well-nourished.  HENT:  Head: Normocephalic and atraumatic.  Right Ear: External ear normal.  Left Ear: External ear normal.  Nose: Nose normal.  Mouth/Throat: Oropharynx is clear and moist.  Eyes: Conjunctivae are normal.  Neck: Neck supple.  Cardiovascular:  Normal rate and regular rhythm.  No murmur heard. Pulmonary/Chest: Effort normal and breath sounds normal. No respiratory distress.  Abdominal: Soft. There is no tenderness.  Musculoskeletal: Normal range of motion. He exhibits no edema.  Neurological: He is alert. He displays normal reflexes. No cranial nerve deficit. Coordination normal.  Skin: Skin is warm and dry.  Psychiatric: He has a normal mood and affect.  Nursing note and vitals reviewed.    ED Treatments / Results  Labs (all labs ordered are listed, but only abnormal results are displayed) Labs Reviewed  URINALYSIS, ROUTINE W REFLEX MICROSCOPIC - Abnormal; Notable for the following components:      Result Value   Color, Urine COLORLESS (*)    Specific Gravity, Urine 1.001 (*)    Glucose, UA 50 (*)    Hgb urine dipstick SMALL (*)    All other components within normal limits  BASIC METABOLIC PANEL  CBC    EKG  EKG Interpretation None       Radiology No results found.  Procedures Procedures (including critical care time)  Medications Ordered in ED Medications - No data to display   Initial Impression / Assessment and Plan / ED Course  I have reviewed the triage vital signs and the nursing notes.  Pertinent labs & imaging results that were available during my care of the patient were reviewed by me and considered in my medical decision making (see chart for details).     Pt looks good overall.  Blood pressure has been slightly elevated. Pt has been on amlodipine which worked well in the past.  I will restart and have pt continue his lisinipril.  Pt has no stroke like symptoms   Final Clinical Impressions(s) / ED Diagnoses   Final diagnoses:  Hypertension, unspecified type    ED Discharge Orders        Ordered    amLODipine (NORVASC) 5 MG tablet  Daily     05/08/17 1621    An After Visit Summary was printed and given to the patient.    Sidney Ace 05/08/17 [place],  MD 05/08/17 2259

## 2017-05-08 NOTE — ED Notes (Signed)
ED Provider at bedside. 

## 2017-05-08 NOTE — ED Notes (Signed)
Pt's family in lobby concerned pt is having stroke. Pt has been having dizziness and intermittent headache X3 days. No slurred speech, no unilateral weakness noted, no facial droop. Pt still AOX4. Updated family on the pt's plan of care and they verbalized understanding.

## 2017-05-25 ENCOUNTER — Other Ambulatory Visit: Payer: Self-pay | Admitting: Family Medicine

## 2017-05-25 DIAGNOSIS — R413 Other amnesia: Secondary | ICD-10-CM

## 2017-05-25 DIAGNOSIS — R269 Unspecified abnormalities of gait and mobility: Secondary | ICD-10-CM

## 2017-06-03 ENCOUNTER — Encounter: Payer: Medicare Other | Attending: Registered Nurse | Admitting: Registered Nurse

## 2017-06-03 ENCOUNTER — Encounter: Payer: Self-pay | Admitting: Registered Nurse

## 2017-06-03 VITALS — BP 130/87 | HR 59 | Resp 14

## 2017-06-03 DIAGNOSIS — Z8249 Family history of ischemic heart disease and other diseases of the circulatory system: Secondary | ICD-10-CM | POA: Diagnosis not present

## 2017-06-03 DIAGNOSIS — M961 Postlaminectomy syndrome, not elsewhere classified: Secondary | ICD-10-CM | POA: Diagnosis not present

## 2017-06-03 DIAGNOSIS — E785 Hyperlipidemia, unspecified: Secondary | ICD-10-CM | POA: Diagnosis not present

## 2017-06-03 DIAGNOSIS — Z841 Family history of disorders of kidney and ureter: Secondary | ICD-10-CM | POA: Diagnosis not present

## 2017-06-03 DIAGNOSIS — I10 Essential (primary) hypertension: Secondary | ICD-10-CM | POA: Insufficient documentation

## 2017-06-03 DIAGNOSIS — M545 Low back pain: Secondary | ICD-10-CM

## 2017-06-03 DIAGNOSIS — M62830 Muscle spasm of back: Secondary | ICD-10-CM | POA: Diagnosis not present

## 2017-06-03 DIAGNOSIS — M5412 Radiculopathy, cervical region: Secondary | ICD-10-CM | POA: Diagnosis not present

## 2017-06-03 DIAGNOSIS — G8929 Other chronic pain: Secondary | ICD-10-CM | POA: Insufficient documentation

## 2017-06-03 DIAGNOSIS — Z87442 Personal history of urinary calculi: Secondary | ICD-10-CM | POA: Diagnosis not present

## 2017-06-03 DIAGNOSIS — Z5181 Encounter for therapeutic drug level monitoring: Secondary | ICD-10-CM

## 2017-06-03 DIAGNOSIS — M25511 Pain in right shoulder: Secondary | ICD-10-CM | POA: Diagnosis not present

## 2017-06-03 DIAGNOSIS — G894 Chronic pain syndrome: Secondary | ICD-10-CM | POA: Diagnosis not present

## 2017-06-03 DIAGNOSIS — H9190 Unspecified hearing loss, unspecified ear: Secondary | ICD-10-CM | POA: Diagnosis not present

## 2017-06-03 DIAGNOSIS — Z823 Family history of stroke: Secondary | ICD-10-CM | POA: Insufficient documentation

## 2017-06-03 DIAGNOSIS — M25512 Pain in left shoulder: Secondary | ICD-10-CM | POA: Diagnosis not present

## 2017-06-03 DIAGNOSIS — Z79899 Other long term (current) drug therapy: Secondary | ICD-10-CM | POA: Diagnosis not present

## 2017-06-03 DIAGNOSIS — Z981 Arthrodesis status: Secondary | ICD-10-CM | POA: Insufficient documentation

## 2017-06-03 DIAGNOSIS — Z87891 Personal history of nicotine dependence: Secondary | ICD-10-CM | POA: Insufficient documentation

## 2017-06-03 DIAGNOSIS — M2550 Pain in unspecified joint: Secondary | ICD-10-CM | POA: Diagnosis not present

## 2017-06-03 DIAGNOSIS — M542 Cervicalgia: Secondary | ICD-10-CM | POA: Diagnosis not present

## 2017-06-03 MED ORDER — OXYCODONE HCL 5 MG PO TABS: 5.0000 mg | ORAL_TABLET | Freq: Four times a day (QID) | ORAL | 0 refills | Status: DC | PRN

## 2017-06-03 MED ORDER — CYCLOBENZAPRINE HCL 5 MG PO TABS: 5.0000 mg | ORAL_TABLET | Freq: Two times a day (BID) | ORAL | 1 refills | Status: DC | PRN

## 2017-06-03 NOTE — Patient Instructions (Addendum)
Don't Drive when you take your muscle Relaxer: Call with any questions : [phone]  Take the Flexeril in the evening or bedtime, call with any questions

## 2017-06-03 NOTE — Progress Notes (Signed)
Subjective:    Patient ID: Fred [name], male    DOB: Oct 18, 1950, 67 y.o.   MRN: [id number]  HPI: [name] is a 67year old male who returns for follow up appointment for chronic pain and medication refill. He states his pain is located in his neck radiating into his bilateral shoulders, lower back pain and reports generalized joint pain. He rates his pain 3. His current exercise regime is walking and performing stretching exercises.   Fred [name] Apical Pulse 62.  Fred [name] MME 23.48  Last UDS was performed on 10/21/2016, it was consistent. Oral Swab Performed Today.   Pain Inventory Average Pain 3 Pain Right Now 3 My pain is constant, stabbing, tingling and aching  In the last 24 hours, has pain interfered with the following? General activity 3 Relation with others 4 Enjoyment of life 4 What TIME of day is your pain at its worst? daytime and evening Sleep (in general) Fair  Pain is worse with: walking, bending, sitting and standing Pain improves with: rest and medication Relief from Meds: 5  Mobility walk without assistance how many minutes can you walk? 10-15 ability to climb steps?  yes do you drive?  yes  Function not employed: date last employed . disabled: date disabled .  Neuro/Psych No problems in this area  Prior Studies Any changes since last visit?  no  Physicians involved in your care Any changes since last visit?  no   Family History  Problem Relation Age of Onset  . Stroke Mother   . Heart disease Father   . Stroke Father        heat stroke  . Kidney disease Brother    Social History   Socioeconomic History  . Marital status: Married    Spouse name: None  . Number of children: None  . Years of education: None  . Highest education level: None  Social Needs  . Financial resource strain: None  . Food insecurity - worry: None  . Food insecurity - inability: None  . Transportation needs - medical: None  .  Transportation needs - non-medical: None  Occupational History  . None  Tobacco Use  . Smoking status: Former Smoker    Packs/day: 1.00    Years: 30.00    Pack years: 30.00    Types: Cigarettes    Last attempt to quit: 05/25/2001    Years since quitting: 16.0  . Smokeless tobacco: Former Systems developer    Quit date: 09/22/2001  Substance and Sexual Activity  . Alcohol use: No  . Drug use: No  . Sexual activity: None  Other Topics Concern  . None  Social History Narrative  . None   Past Surgical History:  Procedure Laterality Date  . BRAIN SURGERY     inserted rod to connect to BaHa(Hearing device)  . EYE SURGERY     lasik  . LYMPHADENECTOMY Bilateral 08/04/2013   Procedure: LYMPHADENECTOMY;  Surgeon: Dutch Gray, MD;  Location: [facility];  Service: Urology;  Laterality: Bilateral;  . ROBOT ASSISTED LAPAROSCOPIC RADICAL PROSTATECTOMY N/A 08/04/2013   Procedure: ROBOTIC ASSISTED LAPAROSCOPIC RADICAL PROSTATECTOMY LEVEL 2;  Surgeon: Dutch Gray, MD;  Location: [facility];  Service: Urology;  Laterality: N/A;  . SPINE SURGERY     cervical disc removed and stabalized with bone- good mobility   Past Medical History:  Diagnosis Date  . Cancer [facility])    prostate  . Depression   . Hearing loss   . History  of kidney stones   . HOH (hard of hearing)   . Hyperlipidemia   . Hypertension   . Neuromuscular disorder (HCC)    BP 130/87 (BP Location: Right Arm, Patient Position: Sitting, Cuff Size: Normal)   Pulse (!) 59   Resp 14   SpO2 96%   Opioid Risk Score:  1 Fall Risk Score:  `1  Depression screen PHQ 2/9  Depression screen [facility] 2/9 02/19/2017 12/12/2014 08/28/2014  Decreased Interest 0 0 2  Down, Depressed, Hopeless 0 0 1  PHQ - 2 Score 0 0 3  Altered sleeping - - 1  Tired, decreased energy - - 2  Change in appetite - - 0  Feeling bad or failure about yourself  - - 0  Trouble concentrating - - 1  Moving slowly or fidgety/restless - - 0  Suicidal thoughts - - 0  PHQ-9 Score - - 7    ] Review of Systems  Constitutional: Negative.   HENT: Negative.   Eyes: Negative.   Respiratory: Negative.   Cardiovascular: Negative.   Gastrointestinal: Negative.   Endocrine: Negative.   Genitourinary: Negative.   Musculoskeletal: Positive for arthralgias, back pain and neck pain.  Skin: Negative.   Allergic/Immunologic: Negative.   Neurological: Negative.   Hematological: Negative.   Psychiatric/Behavioral: Negative.        Objective:   Physical Exam  Constitutional: He is oriented to person, place, and time. He appears well-developed and well-nourished.  HENT:  Head: Normocephalic and atraumatic.  Neck: Normal range of motion. Neck supple.  Cervical Paraspinal Tenderness: C-5-C-6  Cardiovascular: Normal rate and regular rhythm.  Pulmonary/Chest: Effort normal and breath sounds normal.  Musculoskeletal:  Normal Muscle Bulk and Muscle Testing Reveals: Upper Extremities: Full ROM  and Muscle Strength 4/5 Bilateral AC Joint Tenderness Lumbar Paraspinal Tenderness: L-3- L-5 Lower Extremities: Full ROM and Muscle Strength 5/5 Arises from Table with ease Narrow Based Gait   Neurological: He is alert and oriented to person, place, and time.  Skin: Skin is warm and dry.  Psychiatric: He has a normal mood and affect.  Nursing note and vitals reviewed.         Assessment & Plan:  1.Cervical Postlaminectomy: Continue HEP and Continue current medication regime . 06/03/2017 2. Cervicalgia/ Cervical Radiculitis: Continue current medication regime. Continue to Monitor. 06/03/2017 3. Chronic Bilateral Shoulder Pain: Continue HEP as Tolerated. 06/03/2017 4. L2 compression fracture/ Chronic Bilateral Low Back Pain without Sciatica: Continue to Monitor: 06/03/2017 Refilled:Oxycodone 5 mg one tablet every 6 hours as needed for moderate pain. # 90. 5. Polyarthralgia: Continue to Monitor. 06/03/2017 6. Muscle Spasm: RX: Flexeril, instructions given.   We will Continue  the opioid monitoring program. This consists of regular clinic visits, examinations, urine drug screen, pill counts as well as use of [place] controlled substance reporting System.  20 minutes of face to face patient care time was spent during this visit. All questions were encouraged and answered.  F/U in 46month

## 2017-06-07 LAB — DRUG TOX MONITOR 1 W/CONF, ORAL FLD
Amphetamines: NEGATIVE ng/mL (ref <10)
Barbiturates: NEGATIVE ng/mL (ref <10)
Benzodiazepines: NEGATIVE ng/mL (ref <0.50)
Buprenorphine: NEGATIVE ng/mL (ref <0.10)
COCAINE: NEGATIVE ng/mL (ref <5.0)
Codeine: NEGATIVE ng/mL (ref <2.5)
DIHYDROCODEINE: NEGATIVE ng/mL (ref <2.5)
Fentanyl: NEGATIVE ng/mL (ref <0.10)
HYDROCODONE: NEGATIVE ng/mL (ref <2.5)
Heroin Metabolite: NEGATIVE ng/mL (ref <1.0)
Hydromorphone: NEGATIVE ng/mL (ref <2.5)
MARIJUANA: NEGATIVE ng/mL (ref <2.5)
MDMA: NEGATIVE ng/mL (ref <10)
Meprobamate: NEGATIVE ng/mL (ref <2.5)
Methadone: NEGATIVE ng/mL (ref <5.0)
Morphine: NEGATIVE ng/mL (ref <2.5)
NICOTINE METABOLITE: NEGATIVE ng/mL (ref <5.0)
NORHYDROCODONE: NEGATIVE ng/mL (ref <2.5)
Noroxycodone: 19.4 ng/mL — ABNORMAL HIGH (ref <2.5)
OXYCODONE: 231.7 ng/mL — AB (ref <2.5)
OXYMORPHONE: NEGATIVE ng/mL (ref <2.5)
Opiates: POSITIVE ng/mL — AB (ref <2.5)
Phencyclidine: NEGATIVE ng/mL (ref <10)
TAPENTADOL: NEGATIVE ng/mL (ref <5.0)
TRAMADOL: NEGATIVE ng/mL (ref <5.0)
Zolpidem: NEGATIVE ng/mL (ref <5.0)

## 2017-06-07 LAB — DRUG TOX ALC METAB W/CON, ORAL FLD: ALCOHOL METABOLITE: NEGATIVE ng/mL (ref <25)

## 2017-06-08 ENCOUNTER — Telehealth: Payer: Self-pay | Admitting: *Deleted

## 2017-06-08 ENCOUNTER — Ambulatory Visit
Admission: RE | Admit: 2017-06-08 | Discharge: 2017-06-08 | Disposition: A | Discharge status: Home / Self Care | Payer: Medicare Other | Source: Ambulatory Visit | Attending: Family Medicine | Admitting: Family Medicine

## 2017-06-08 ENCOUNTER — Other Ambulatory Visit: Payer: Medicare Other

## 2017-06-08 DIAGNOSIS — R413 Other amnesia: Secondary | ICD-10-CM

## 2017-06-08 DIAGNOSIS — R269 Unspecified abnormalities of gait and mobility: Secondary | ICD-10-CM

## 2017-06-08 NOTE — Telephone Encounter (Signed)
Oral swab drug screen was consistent for prescribed medications.  ?

## 2017-06-17 ENCOUNTER — Telehealth: Payer: Self-pay | Admitting: *Deleted

## 2017-06-17 NOTE — Telephone Encounter (Signed)
Oral swab drug screen was consistent for prescribed medications.  ?

## 2017-07-09 ENCOUNTER — Telehealth: Payer: Self-pay | Admitting: Physical Medicine & Rehabilitation

## 2017-07-09 ENCOUNTER — Encounter: Payer: Medicare Other | Attending: Registered Nurse

## 2017-07-09 ENCOUNTER — Ambulatory Visit: Payer: Medicare Other | Admitting: Physical Medicine & Rehabilitation

## 2017-07-09 ENCOUNTER — Encounter: Payer: Self-pay | Admitting: Physical Medicine & Rehabilitation

## 2017-07-09 VITALS — BP 133/90 | HR 62

## 2017-07-09 DIAGNOSIS — I10 Essential (primary) hypertension: Secondary | ICD-10-CM | POA: Diagnosis not present

## 2017-07-09 DIAGNOSIS — E785 Hyperlipidemia, unspecified: Secondary | ICD-10-CM | POA: Insufficient documentation

## 2017-07-09 DIAGNOSIS — H9190 Unspecified hearing loss, unspecified ear: Secondary | ICD-10-CM | POA: Insufficient documentation

## 2017-07-09 DIAGNOSIS — Z87442 Personal history of urinary calculi: Secondary | ICD-10-CM | POA: Diagnosis not present

## 2017-07-09 DIAGNOSIS — Z981 Arthrodesis status: Secondary | ICD-10-CM | POA: Diagnosis not present

## 2017-07-09 DIAGNOSIS — M961 Postlaminectomy syndrome, not elsewhere classified: Secondary | ICD-10-CM | POA: Diagnosis not present

## 2017-07-09 DIAGNOSIS — M545 Low back pain: Secondary | ICD-10-CM | POA: Diagnosis not present

## 2017-07-09 DIAGNOSIS — M25512 Pain in left shoulder: Secondary | ICD-10-CM | POA: Diagnosis not present

## 2017-07-09 DIAGNOSIS — Z823 Family history of stroke: Secondary | ICD-10-CM | POA: Diagnosis not present

## 2017-07-09 DIAGNOSIS — Z8249 Family history of ischemic heart disease and other diseases of the circulatory system: Secondary | ICD-10-CM | POA: Insufficient documentation

## 2017-07-09 DIAGNOSIS — M25511 Pain in right shoulder: Secondary | ICD-10-CM | POA: Diagnosis not present

## 2017-07-09 DIAGNOSIS — G8929 Other chronic pain: Secondary | ICD-10-CM | POA: Insufficient documentation

## 2017-07-09 DIAGNOSIS — Z841 Family history of disorders of kidney and ureter: Secondary | ICD-10-CM | POA: Diagnosis not present

## 2017-07-09 DIAGNOSIS — M542 Cervicalgia: Secondary | ICD-10-CM | POA: Insufficient documentation

## 2017-07-09 DIAGNOSIS — Z87891 Personal history of nicotine dependence: Secondary | ICD-10-CM | POA: Insufficient documentation

## 2017-07-09 MED ORDER — OXYCODONE HCL 5 MG PO TABS: 5.0000 mg | ORAL_TABLET | Freq: Three times a day (TID) | ORAL | 0 refills | Status: DC | PRN

## 2017-07-09 NOTE — Telephone Encounter (Signed)
Mr [facility] notified and [facility] notified to cancel the previous order for oxycodone 5 mg tablets.  Pharmacy has been changed to [place].

## 2017-07-09 NOTE — Telephone Encounter (Signed)
Please change pharmacy for the order of Oxycodone as discussed verbally

## 2017-07-09 NOTE — Progress Notes (Signed)
Subjective:    Patient ID: Fred [name], male    DOB: 1950/12/23, 67 y.o.   MRN: [id number]  HPI  67 year old male with history of cervical postlaminectomy syndrome status post C4 through C7 ACDF in 2012 per Dr. Ronnald Ramp.  He has had chronic right hemiparesis as result of his myelopathy Patient also has chronic low back pain he has had L2 compression fracture in 2014. Patient returns today complaining of some increasing low back as well as neck pain.  He states the cold weather is factor.  He has exceeded his dose of narcotic analgesic this month and ran out about 4 days ago.  He is also taking cyclobenzaprine as a muscle relaxer. Using heat.Has not tried TENS Pain Inventory Average Pain 6 Pain Right Now 5 My pain is .  In the last 24 hours, has pain interfered with the following? General activity 6 Relation with others 7 Enjoyment of life 6 What TIME of day is your pain at its worst? daytime Sleep (in general) Fair  Pain is worse with: not answered Pain improves with: not answered Relief from Meds: not answered  Mobility walk without assistance ability to climb steps?  yes do you drive?  yes  Function disabled: date disabled 2001  Neuro/Psych No problems in this area  Prior Studies Any changes since last visit?  no  Physicians involved in your care Any changes since last visit?  no   Family History  Problem Relation Age of Onset  . Stroke Mother   . Heart disease Father   . Stroke Father        heat stroke  . Kidney disease Brother    Social History   Socioeconomic History  . Marital status: Married    Spouse name: Not on file  . Number of children: Not on file  . Years of education: Not on file  . Highest education level: Not on file  Social Needs  . Financial resource strain: Not on file  . Food insecurity - worry: Not on file  . Food insecurity - inability: Not on file  . Transportation needs - medical: Not on file  . Transportation needs -  non-medical: Not on file  Occupational History  . Not on file  Tobacco Use  . Smoking status: Former Smoker    Packs/day: 1.00    Years: 30.00    Pack years: 30.00    Types: Cigarettes    Last attempt to quit: 05/25/2001    Years since quitting: 16.1  . Smokeless tobacco: Former Systems developer    Quit date: 09/22/2001  Substance and Sexual Activity  . Alcohol use: No  . Drug use: No  . Sexual activity: Not on file  Other Topics Concern  . Not on file  Social History Narrative  . Not on file   Past Surgical History:  Procedure Laterality Date  . BRAIN SURGERY     inserted rod to connect to BaHa(Hearing device)  . EYE SURGERY     lasik  . LYMPHADENECTOMY Bilateral 08/04/2013   Procedure: LYMPHADENECTOMY;  Surgeon: Dutch Gray, MD;  Location: [facility];  Service: Urology;  Laterality: Bilateral;  . ROBOT ASSISTED LAPAROSCOPIC RADICAL PROSTATECTOMY N/A 08/04/2013   Procedure: ROBOTIC ASSISTED LAPAROSCOPIC RADICAL PROSTATECTOMY LEVEL 2;  Surgeon: Dutch Gray, MD;  Location: [facility];  Service: Urology;  Laterality: N/A;  . SPINE SURGERY     cervical disc removed and stabalized with bone- good mobility   Past Medical History:  Diagnosis Date  .  Cancer [facility])    prostate  . Depression   . Hearing loss   . History of kidney stones   . HOH (hard of hearing)   . Hyperlipidemia   . Hypertension   . Neuromuscular disorder ([place])    There were no vitals taken for this visit.  Opioid Risk Score:   Fall Risk Score:  `1  Depression screen PHQ 2/9  Depression screen [facility] 2/9 02/19/2017 12/12/2014 08/28/2014  Decreased Interest 0 0 2  Down, Depressed, Hopeless 0 0 1  PHQ - 2 Score 0 0 3  Altered sleeping - - 1  Tired, decreased energy - - 2  Change in appetite - - 0  Feeling bad or failure about yourself  - - 0  Trouble concentrating - - 1  Moving slowly or fidgety/restless - - 0  Suicidal thoughts - - 0  PHQ-9 Score - - 7     Review of Systems  Constitutional: Negative.   HENT: Negative.     Eyes: Negative.   Respiratory: Negative.   Cardiovascular: Negative.   Gastrointestinal: Negative.   Endocrine: Negative.   Genitourinary: Negative.   Musculoskeletal: Positive for arthralgias, back pain, myalgias and neck pain.  Skin: Negative.   Allergic/Immunologic: Negative.   Neurological: Negative.   Hematological: Negative.   Psychiatric/Behavioral: Negative.   All other systems reviewed and are negative.      Objective:   Physical Exam  Constitutional: He is oriented to person, place, and time. He appears well-developed and well-nourished.  HENT:  Head: Normocephalic and atraumatic.  Eyes: Conjunctivae are normal. Pupils are equal, round, and reactive to light.  Neck: Normal range of motion.  Neurological: He is alert and oriented to person, place, and time.  Psychiatric: He has a normal mood and affect.  Nursing note and vitals reviewed.  Mild tenderness palpation upper trapezius area, cervical range of motion 50% flexion extension lateral bending and rotation Low back no tenderness palpation. Upper extremity strength is 4/5 in the right deltoid bicep tricep grip Left upper extremity strength 5/5 in the deltoid bicep tricep grip Right lower extremity strength 4/5 in the right hip flexor knee extensor ankle dorsiflexor left lower extremity strength 5/5 in the left hip flexor knee extensor ankle dorsiflexor gait is without evidence of toe drag or knee instability no assistive device required       Assessment & Plan:  1 cervical postlaminectomy syndrome with chronic neck pain.  He has had chronic right hemiparesis secondary to cervical myelopathy.  I will continue his current dose of narcotic analgesics but have warned him not to exceed his prescribed dose.  We are looking at other nonnarcotic options and I have ordered a TENS unit that he could use on his upper trapezius area to manage his chronic neck pain. His other treatment options include trigger point injections  to the upper trapezius which can be performed every 2-3 months.  2.  Chronic lumbar pain history of lumbar compression fracture no evidence of radiculopathy.  Indication for chronic opioid: Chronic cervical myelopathy, cervical postlaminectomy syndrome Medication and dose: Oxycodone 5 mg 3 times daily # pills per month: 90 Last UDS date:06/03/2017 Pain contract signed (Y/N): Yes Date narcotic database last reviewed (include red flags): 07/09/2017 no red flags

## 2017-07-09 NOTE — Patient Instructions (Signed)
TENS unit ordered.

## 2017-07-09 NOTE — Telephone Encounter (Signed)
Patient went to his pharmacy [facility] and they do not have his oxycodone--they will not be able to get it until Monday.   Patient would like to know if we could call it into [facility] in [place].  Please call patient.

## 2017-07-09 NOTE — Telephone Encounter (Signed)
Prescription e scribed 

## 2017-07-14 ENCOUNTER — Ambulatory Visit: Payer: Medicare Other | Admitting: Neurology

## 2017-07-22 ENCOUNTER — Ambulatory Visit: Payer: Medicare Other | Admitting: Neurology

## 2017-07-22 ENCOUNTER — Encounter: Payer: Self-pay | Admitting: Neurology

## 2017-07-22 ENCOUNTER — Other Ambulatory Visit: Payer: Self-pay

## 2017-07-22 VITALS — BP 104/64 | HR 55 | Ht 68.0 in | Wt 174.5 lb

## 2017-07-22 DIAGNOSIS — E538 Deficiency of other specified B group vitamins: Secondary | ICD-10-CM | POA: Diagnosis not present

## 2017-07-22 DIAGNOSIS — R269 Unspecified abnormalities of gait and mobility: Secondary | ICD-10-CM | POA: Diagnosis not present

## 2017-07-22 DIAGNOSIS — R413 Other amnesia: Secondary | ICD-10-CM | POA: Insufficient documentation

## 2017-07-22 DIAGNOSIS — M542 Cervicalgia: Secondary | ICD-10-CM

## 2017-07-22 HISTORY — DX: Unspecified abnormalities of gait and mobility: R26.9

## 2017-07-22 NOTE — Progress Notes (Signed)
Reason for visit: Gait disorder, memory disorder  Referring physician: Dr. Octavia Bruckner is a 67 y.o. male  History of present illness:  Fred [name] is a 67 year old right-handed white male with a history of a closed head injury that occurred around 2012.  The patient lost hearing in his right ear following the fall.  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  The patient also had a herniated disc at the C4-5 level that required surgery.  The patient had spinal cord compression associated with this.  The patient had some alteration in his balance since the neck surgery.  Within the last 6 months, the patient has had some slight worsening of balance, he has not had any falls.  He has had some increase in neck and shoulder discomfort without pain down the arms.  He has chronic numbness in both feet that the patient claims came on many years ago following a frostbite episode while deer hunting.  The patient has not had any progression of the numbness of the feet.  The patient does have chronic low back pain as well, he denies pain down the legs.  The patient lives with his wife, his wife helps keep up with medications and appointments.  The patient has been on Aricept for 2 years, his memory problems have been present for 2 years.  The patient does drive a car, he uses GPS, he has not had any safety issues or significant problems with directions.  The wife does the finances, she always has.  The patient has a mother and an older brother with Alzheimer's disease.  He is sent to this office for an evaluation.  The patient does have occasional episodes of tremors that may come on often times if he misses a meal.  This is not a daily event.  Past Medical History:  Diagnosis Date  . Cancer [facility])    prostate  . Depression   . Gait abnormality 07/22/2017  . Hearing loss   . History of kidney stones     . HOH (hard of hearing)   . Hyperlipidemia   . Hypertension   . Memory disorder 07/22/2017  . Neuromuscular disorder [facility])     Past Surgical History:  Procedure Laterality Date  . BRAIN SURGERY     inserted rod to connect to BaHa(Hearing device)  . EYE SURGERY     lasik  . LYMPHADENECTOMY Bilateral 08/04/2013   Procedure: LYMPHADENECTOMY;  Surgeon: Fred Gray, MD;  [facility];  Service: Urology;  Laterality: Bilateral;  . PROSTATE SURGERY    . ROBOT ASSISTED LAPAROSCOPIC RADICAL PROSTATECTOMY N/A 08/04/2013   Procedure: ROBOTIC ASSISTED LAPAROSCOPIC RADICAL PROSTATECTOMY LEVEL 2;  Surgeon: Fred Gray, MD;  [facility];  Service: Urology;  Laterality: N/A;  . SPINE SURGERY     cervical disc removed and stabalized with bone- good mobility    Family History  Problem Relation Age of Onset  . Stroke Mother   . Heart disease Father   . Stroke Father        heat stroke  . Kidney disease Brother     Social history:  reports that he quit smoking about 16 years ago. His smoking use included cigarettes. He has a 30.00 pack-year smoking history. He quit smokeless tobacco use about 15 years ago. He reports that he does not drink alcohol or use drugs.  Medications:  Prior to Admission medications   Medication Sig Start Date End Date Taking? Authorizing Provider  cyclobenzaprine (FLEXERIL) 5 MG tablet Take 1 tablet (5 mg total) by mouth 2 (two) times daily as needed for muscle spasms. 06/03/17  Yes Bayard Hugger, NP  DULoxetine (CYMBALTA) 60 MG capsule Take 60 mg by mouth at bedtime.    Yes [provider]  lisinopril-hydrochlorothiazide (PRINZIDE,ZESTORETIC) 20-12.5 MG per tablet Take 2 tablets by mouth every morning.   Yes [provider]  oxyCODONE (OXY IR/ROXICODONE) 5 MG immediate release tablet Take 1 tablet (5 mg total) by mouth every 8 (eight) hours as needed for severe pain. 07/09/17  Yes Bayard Hugger, NP      Allergies  Allergen Reactions  .  Lyrica [Pregabalin]     Falls asleep while talking to people  . Neurontin [Gabapentin] Hives and Nausea And Vomiting    08/01/13  PT DENIES  . Percodan [Oxycodone-Aspirin] Hives and Nausea And Vomiting    ROS:  Out of a complete 14 system review of symptoms, the patient complains only of the following symptoms, and all other reviewed systems are negative.  Easy bleeding Hearing loss Joint pain, aching muscles Memory loss, confusion, numbness, tremor Depression, anxiety, not enough sleep Snoring  Blood pressure 104/64, pulse (!) 55, height 5\' 8"  (1.727 m), weight 174 lb 8 oz (79.2 kg).  Physical Exam  General: The patient is alert and cooperative at the time of the examination.  Eyes: Pupils are equal, round, and reactive to light. Discs are flat bilaterally.  Neck: The neck is supple, no carotid bruits are noted.  Respiratory: The respiratory examination is clear.  Cardiovascular: The cardiovascular examination reveals a regular rate and rhythm, no obvious murmurs or rubs are noted.  Skin: Extremities are without significant edema.  Neurologic Exam  Mental status: The patient is alert and oriented x 3 at the time of the examination. The Mini-Mental status examination done today shows a total score 26/30.  Cranial nerves: Facial symmetry is present. There is good sensation of the face to pinprick and soft touch bilaterally. The strength of the facial muscles and the muscles to head turning and shoulder shrug are normal bilaterally. Speech is well enunciated, no aphasia or dysarthria is noted. Extraocular movements are full. Visual fields are full. The tongue is midline, and the patient has symmetric elevation of the soft palate. No obvious hearing deficits are noted.  Motor: The motor testing reveals 5 over 5 strength of all 4 extremities. Good symmetric motor tone is noted throughout.  Sensory: Sensory testing is intact to pinprick, soft touch, vibration sensation, and  position sense on all 4 extremities, with exception of a stocking pattern pinprick sensory deficit across the ankles bilaterally. No evidence of extinction is noted.  Coordination: Cerebellar testing reveals good finger-nose-finger and heel-to-shin bilaterally.  Gait and station: Gait is slightly wide-based, the patient has a limping gait on the right leg. Tandem gait is minimally unsteady. Romberg is negative. No drift is seen.  Reflexes: Deep tendon reflexes are symmetric and normal bilaterally, with exception of slight reduction of the right ankle jerk reflexes compared to the left.  The left ankle jerk reflex is well maintained. Toes are downgoing bilaterally.   MRI cervical 09/06/10:  IMPRESSION:  1.  Multilevel spondylosis with resulting central and foraminal stenosis, not significantly changed compared with CT of 3 weeks ago. 2.  At C4-C5, there is a large central disc extrusion with caudal migration of disc material and  epidural hemorrhage contributing to cord compression and mild right greater than left foraminal stenosis.  There is a possible cord edema or early myelomalacia. 3.  Spondylosis at C5-C6 contributes to mild central stenosis without cord deformity but severe left and moderate right foraminal stenosis. 4.  Spondylosis and bilateral disc osteophyte complexes at C6-C7 contribute to mild central stenosis without cord deformity but severe right and moderate left foraminal stenosis.  * MRI scan images were reviewed online. I agree with the written report.   MRI brain 06/08/17:  IMPRESSION: 1. No acute intracranial abnormality. 2. 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 3. Mild chronic small vessel ischemic disease.  * MRI scan images were reviewed online. I agree with the written report.   Assessment/Plan:  1.  Progressive memory disorder  2.  Gait disorder  3.  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  After the fall in 2012 the patient also had cervical spine surgery with spinal cord compression at the C4-5 level.  This may have resulted in some injury to the spinal cord.  The patient did report some change in balance following that, but he has had some worsening of neck pain, shoulder discomfort and balance within the last 6 months.  The patient will be sent for blood work today, a repeat MRI of the cervical spine will be done.  The patient will follow-up in 6 months.  The patient will remain on the Aricept.  He has a strong family history for Alzheimer's disease.  Fred Alexanders MD 07/22/2017 2:40 PM  [facility] [address] [place], [place] [address]  Phone [phone] Fax [phone]

## 2017-07-22 NOTE — Patient Instructions (Signed)
   MRI of the cervical spine.

## 2017-07-23 LAB — SEDIMENTATION RATE: Sed Rate: 11 mm/hr (ref 0–30)

## 2017-07-23 LAB — VITAMIN B12: VITAMIN B 12: 268 pg/mL (ref 232–1245)

## 2017-07-23 LAB — B. BURGDORFI ANTIBODIES

## 2017-07-23 LAB — RPR: RPR Ser Ql: NONREACTIVE

## 2017-07-23 LAB — ANA W/REFLEX: Anti Nuclear Antibody(ANA): NEGATIVE

## 2017-07-23 LAB — HIV ANTIBODY (ROUTINE TESTING W REFLEX): HIV Screen 4th Generation wRfx: NONREACTIVE

## 2017-07-24 ENCOUNTER — Telehealth: Payer: Self-pay

## 2017-07-24 NOTE — Telephone Encounter (Signed)
I called pt, left a detailed message on cell number per DPR, advising him that his blood work was unremarkable and to call me back with any questions or concerns.

## 2017-07-24 NOTE — Telephone Encounter (Signed)
-----   Message from Kathrynn Ducking, MD sent at 07/23/2017  5:28 PM EST -----   The blood work results are unremarkable. Please call the patient.  ----- Message ----- From: Lavone Neri Lab Results In Sent: 07/23/2017   7:45 AM To: Kathrynn Ducking, MD

## 2017-08-03 ENCOUNTER — Ambulatory Visit
Admission: RE | Admit: 2017-08-03 | Discharge: 2017-08-03 | Disposition: A | Discharge status: Home / Self Care | Payer: Medicare Other | Source: Ambulatory Visit | Attending: Neurology | Admitting: Neurology

## 2017-08-03 ENCOUNTER — Telehealth: Payer: Self-pay | Admitting: Neurology

## 2017-08-03 DIAGNOSIS — R269 Unspecified abnormalities of gait and mobility: Secondary | ICD-10-CM

## 2017-08-03 DIAGNOSIS — M542 Cervicalgia: Secondary | ICD-10-CM | POA: Diagnosis not present

## 2017-08-03 NOTE — Telephone Encounter (Signed)
I called the patient.  The MRI of the cervical spine does not show any spinal cord compression or anything that would alter balance.  No evidence of nerve root compression is seen.  If his balance continues to worsen, he will call our office.  MRI cervical 08/03/17:    IMPRESSION: This MRI of the cervical spine without contrast shows the following: 1.    There is no spinal cord compression the spinal cord has normal signal. 2.    At C2-C3 there is moderate right foraminal narrowing due to small disc protrusion and right uncovertebral spurring and facet hypertrophy.  There did not appear to be any definite nerve root compression. 3.    At C3-C4, there is moderate spinal stenosis and moderate right foraminal narrowing.  There does not appear to be any definite nerve root compression. 4.    Since the MRI dated 09/06/2010, there has been fusion from C4 through C7.  There continues to be some degenerative changes at these levels and mild residual spinal stenosis at C4-C5 and C6-C7.    At C4-C5 there is mild to moderate right foraminal narrowing, at C5-C6 there is mild bilateral foraminal narrowing and a C6-C7 there is mild to moderate right foraminal narrowing.  There does not appear to be any nerve root compression at these levels.

## 2017-08-05 ENCOUNTER — Encounter: Payer: Self-pay | Admitting: Family Medicine

## 2017-08-06 ENCOUNTER — Encounter: Payer: Self-pay | Admitting: Registered Nurse

## 2017-08-06 ENCOUNTER — Encounter: Payer: Medicare Other | Attending: Registered Nurse | Admitting: Registered Nurse

## 2017-08-06 VITALS — BP 125/84 | HR 55 | Resp 14

## 2017-08-06 DIAGNOSIS — M545 Low back pain: Secondary | ICD-10-CM | POA: Diagnosis not present

## 2017-08-06 DIAGNOSIS — Z5181 Encounter for therapeutic drug level monitoring: Secondary | ICD-10-CM | POA: Diagnosis not present

## 2017-08-06 DIAGNOSIS — Z87891 Personal history of nicotine dependence: Secondary | ICD-10-CM | POA: Insufficient documentation

## 2017-08-06 DIAGNOSIS — Z79899 Other long term (current) drug therapy: Secondary | ICD-10-CM | POA: Diagnosis not present

## 2017-08-06 DIAGNOSIS — H9190 Unspecified hearing loss, unspecified ear: Secondary | ICD-10-CM | POA: Diagnosis not present

## 2017-08-06 DIAGNOSIS — E785 Hyperlipidemia, unspecified: Secondary | ICD-10-CM | POA: Diagnosis not present

## 2017-08-06 DIAGNOSIS — M25512 Pain in left shoulder: Secondary | ICD-10-CM

## 2017-08-06 DIAGNOSIS — Z981 Arthrodesis status: Secondary | ICD-10-CM | POA: Insufficient documentation

## 2017-08-06 DIAGNOSIS — M961 Postlaminectomy syndrome, not elsewhere classified: Secondary | ICD-10-CM | POA: Diagnosis not present

## 2017-08-06 DIAGNOSIS — Z841 Family history of disorders of kidney and ureter: Secondary | ICD-10-CM | POA: Insufficient documentation

## 2017-08-06 DIAGNOSIS — M25511 Pain in right shoulder: Secondary | ICD-10-CM

## 2017-08-06 DIAGNOSIS — G894 Chronic pain syndrome: Secondary | ICD-10-CM

## 2017-08-06 DIAGNOSIS — Z8249 Family history of ischemic heart disease and other diseases of the circulatory system: Secondary | ICD-10-CM | POA: Diagnosis not present

## 2017-08-06 DIAGNOSIS — M542 Cervicalgia: Secondary | ICD-10-CM | POA: Diagnosis not present

## 2017-08-06 DIAGNOSIS — G8929 Other chronic pain: Secondary | ICD-10-CM | POA: Insufficient documentation

## 2017-08-06 DIAGNOSIS — Z87442 Personal history of urinary calculi: Secondary | ICD-10-CM | POA: Insufficient documentation

## 2017-08-06 DIAGNOSIS — M5412 Radiculopathy, cervical region: Secondary | ICD-10-CM

## 2017-08-06 DIAGNOSIS — Z823 Family history of stroke: Secondary | ICD-10-CM | POA: Insufficient documentation

## 2017-08-06 DIAGNOSIS — I10 Essential (primary) hypertension: Secondary | ICD-10-CM | POA: Insufficient documentation

## 2017-08-06 MED ORDER — OXYCODONE HCL 5 MG PO TABS: 5.0000 mg | ORAL_TABLET | Freq: Three times a day (TID) | ORAL | 0 refills | Status: DC | PRN

## 2017-08-06 NOTE — Progress Notes (Signed)
Subjective:    Patient ID: Fred [name], male    DOB: Jul 15, 1950, 67 y.o.   MRN: [id number]  HPI: [name] is a 67 year old male who returns for follow up appointment for chronic pain and medication refill. He states his pain is located in his neck radiating into his bilateral shoulders, mid- lower back pain and bilateral feet with numbness.Marland Kitchen He rates his pain 3. His current exercise regime is walking.   [name] 61.  [name] MME 21.00  Last  Oral Swab was  Performed on 06/03/2017 it was consistent.    Pain Inventory Average Pain 4 Pain Right Now 3 My pain is constant, sharp, stabbing, tingling and aching  In the last 24 hours, has pain interfered with the following? General activity 3 Relation with others 4 Enjoyment of life 4 What TIME of day is your pain at its worst? daytime, evening Sleep (in general) Fair  Pain is worse with: walking, bending, sitting and standing Pain improves with: rest and medication Relief from Meds: 5  Mobility walk without assistance how many minutes can you walk? 10-15 ability to climb steps?  yes do you drive?  yes  Function not employed: date last employed . disabled: date disabled .  Neuro/Psych tremor loss of taste or smell  Prior Studies Any changes since last visit?  no  Physicians involved in your care Any changes since last visit?  no   Family History  Problem Relation Age of Onset  . Stroke Mother   . Dementia Mother   . Heart disease Father   . Stroke Father        heat stroke  . Kidney disease Brother   . Dementia Brother    Social History   Socioeconomic History  . Marital status: Married    Spouse name: Not on file  . Number of children: 6  . Years of education: 12+  . Highest education level: Not on file  Occupational History  . Occupation: Retired  Scientific laboratory technician  . Financial resource strain: Not on file  . Food insecurity:    Worry: Not on file    Inability:  Not on file  . Transportation needs:    Medical: Not on file    Non-medical: Not on file  Tobacco Use  . Smoking status: Former Smoker    Packs/day: 1.00    Years: 30.00    Pack years: 30.00    Types: Cigarettes    Last attempt to quit: 05/25/2001    Years since quitting: 16.2  . Smokeless tobacco: Former Systems developer    Quit date: 09/22/2001  Substance and Sexual Activity  . Alcohol use: No  . Drug use: No  . Sexual activity: Not on file  Lifestyle  . Physical activity:    Days per week: Not on file    Minutes per session: Not on file  . Stress: Not on file  Relationships  . Social connections:    Talks on phone: Not on file    Gets together: Not on file    Attends religious service: Not on file    Active member of club or organization: Not on file    Attends meetings of clubs or organizations: Not on file    Relationship status: Not on file  Other Topics Concern  . Not on file  Social History Narrative   Lives with wife   Caffeine use: Coffee daily (1-2 per day)   Some  soda   Right handed    Past Surgical History:  Procedure Laterality Date  . BRAIN SURGERY     inserted rod to connect to BaHa(Hearing device)  . EYE SURGERY     lasik  . LYMPHADENECTOMY Bilateral 08/04/2013   Procedure: LYMPHADENECTOMY;  Surgeon: Dutch Gray, MD;  [facility];  Service: Urology;  Laterality: Bilateral;  . PROSTATE SURGERY    . ROBOT ASSISTED LAPAROSCOPIC RADICAL PROSTATECTOMY N/A 08/04/2013   Procedure: ROBOTIC ASSISTED LAPAROSCOPIC RADICAL PROSTATECTOMY LEVEL 2;  Surgeon: Dutch Gray, MD;  Location[facility];  Service: Urology;  Laterality: N/A;  . SPINE SURGERY     cervical disc removed and stabalized with bone- good mobility   Past Medical History:  Diagnosis Date  . Cancer [facility])    prostate  . Depression   . Gait abnormality 07/22/2017  . Hearing loss   . History of kidney stones   . HOH (hard of hearing)   . Hyperlipidemia   . Hypertension   . Memory disorder 07/22/2017  .  Neuromuscular disorder ([facility])    BP 125/84 (BP Location: Right Arm, Patient Position: Sitting, Cuff Size: Normal)   [name] (!) 55   Resp 14   SpO2 96%   Opioid Risk Score:  1 Fall Risk Score:  `1  Depression screen PHQ 2/9  Depression screen [facility] 2/9 02/19/2017 12/12/2014 08/28/2014  Decreased Interest 0 0 2  Down, Depressed, Hopeless 0 0 1  PHQ - 2 Score 0 0 3  Altered sleeping - - 1  Tired, decreased energy - - 2  Change in appetite - - 0  Feeling bad or failure about yourself  - - 0  Trouble concentrating - - 1  Moving slowly or fidgety/restless - - 0  Suicidal thoughts - - 0  PHQ-9 Score - - 7   ] Review of Systems  Constitutional: Negative.   HENT: Negative.   Eyes: Negative.   Respiratory: Negative.   Cardiovascular: Negative.   Gastrointestinal: Negative.   Endocrine: Negative.   Genitourinary: Negative.   Musculoskeletal: Positive for arthralgias, back pain and neck pain.  Skin: Negative.   Allergic/Immunologic: Negative.   Neurological: Positive for tremors.  Hematological: Negative.   Psychiatric/Behavioral: Negative.        Objective:   Physical Exam  Constitutional: He is oriented to person, place, and time. He appears well-developed and well-nourished.  HENT:  Head: Normocephalic and atraumatic.  Neck: Normal range of motion. Neck supple.  Cervical Paraspinal Tenderness: C-5-C-6  Cardiovascular: Normal rate and regular rhythm.  Pulmonary/Chest: Effort normal and breath sounds normal.  Musculoskeletal:  Normal Muscle Bulk and Muscle Testing Reveals: Upper Extremities: Full ROM  and Muscle Strength 4/5 Bilateral AC Joint Tenderness Lumbar Paraspinal Tenderness: L-3- L-5 Lower Extremities: Full ROM and Muscle Strength 5/5 Arises from Table with ease Narrow Based Gait   Neurological: He is alert and oriented to person, place, and time.  Skin: Skin is warm and dry.  Psychiatric: He has a normal mood and affect.  Nursing note and vitals  reviewed.         Assessment & Plan:  1.Cervical Postlaminectomy: Continue HEP and Continue current medication regime . 08/06/2017 2. Cervicalgia/ Cervical Radiculitis: Continue current medication regime. Continue to Monitor. 08/06/2017 3. Chronic Bilateral Shoulder Pain: Continue HEP as Tolerated. 08/06/2017 4. L2 compression fracture/ Chronic Bilateral Low Back Pain without Sciatica: Continue to Monitor: 08/06/2017 Refilled:Oxycodone 5 mg one tablet every 8 hours as needed for moderate pain. # 90. We  will Continue the opioid monitoring program. This consists of regular clinic visits, examinations, urine drug screen, pill counts as well as use of [place] controlled substance reporting System. 5. Polyarthralgia: Continue to Monitor. 08/06/2017 6. Muscle Spasm: Continue : Flexeril.  20 minutes of face to face patient care time was spent during this visit. All questions were encouraged and answered.  F/U in 37month

## 2017-09-01 ENCOUNTER — Encounter: Payer: Medicare Other | Admitting: Registered Nurse

## 2017-09-02 ENCOUNTER — Encounter: Payer: Medicare Other | Attending: Registered Nurse | Admitting: Registered Nurse

## 2017-09-02 ENCOUNTER — Encounter: Payer: Self-pay | Admitting: Registered Nurse

## 2017-09-02 VITALS — BP 145/88 | HR 70 | Ht 68.0 in | Wt 176.0 lb

## 2017-09-02 DIAGNOSIS — G8929 Other chronic pain: Secondary | ICD-10-CM

## 2017-09-02 DIAGNOSIS — M961 Postlaminectomy syndrome, not elsewhere classified: Secondary | ICD-10-CM

## 2017-09-02 DIAGNOSIS — M545 Low back pain: Secondary | ICD-10-CM

## 2017-09-02 DIAGNOSIS — Z8249 Family history of ischemic heart disease and other diseases of the circulatory system: Secondary | ICD-10-CM | POA: Insufficient documentation

## 2017-09-02 DIAGNOSIS — M542 Cervicalgia: Secondary | ICD-10-CM

## 2017-09-02 DIAGNOSIS — Z5181 Encounter for therapeutic drug level monitoring: Secondary | ICD-10-CM

## 2017-09-02 DIAGNOSIS — Z79899 Other long term (current) drug therapy: Secondary | ICD-10-CM

## 2017-09-02 DIAGNOSIS — Z87442 Personal history of urinary calculi: Secondary | ICD-10-CM | POA: Diagnosis not present

## 2017-09-02 DIAGNOSIS — Z841 Family history of disorders of kidney and ureter: Secondary | ICD-10-CM | POA: Diagnosis not present

## 2017-09-02 DIAGNOSIS — M25512 Pain in left shoulder: Secondary | ICD-10-CM | POA: Insufficient documentation

## 2017-09-02 DIAGNOSIS — Z981 Arthrodesis status: Secondary | ICD-10-CM | POA: Diagnosis not present

## 2017-09-02 DIAGNOSIS — I10 Essential (primary) hypertension: Secondary | ICD-10-CM | POA: Diagnosis not present

## 2017-09-02 DIAGNOSIS — Z87891 Personal history of nicotine dependence: Secondary | ICD-10-CM | POA: Diagnosis not present

## 2017-09-02 DIAGNOSIS — M25511 Pain in right shoulder: Secondary | ICD-10-CM | POA: Insufficient documentation

## 2017-09-02 DIAGNOSIS — Z823 Family history of stroke: Secondary | ICD-10-CM | POA: Insufficient documentation

## 2017-09-02 DIAGNOSIS — G894 Chronic pain syndrome: Secondary | ICD-10-CM | POA: Diagnosis not present

## 2017-09-02 DIAGNOSIS — H9190 Unspecified hearing loss, unspecified ear: Secondary | ICD-10-CM | POA: Insufficient documentation

## 2017-09-02 DIAGNOSIS — M5412 Radiculopathy, cervical region: Secondary | ICD-10-CM

## 2017-09-02 DIAGNOSIS — E785 Hyperlipidemia, unspecified: Secondary | ICD-10-CM | POA: Insufficient documentation

## 2017-09-02 MED ORDER — OXYCODONE HCL 5 MG PO TABS: 5.0000 mg | ORAL_TABLET | Freq: Three times a day (TID) | ORAL | 0 refills | Status: DC | PRN

## 2017-09-02 NOTE — Progress Notes (Signed)
Subjective:    Patient ID: Fred [name], male    DOB: 1950/06/25, 67 y.o.   MRN: [id number]  HPI: [name] is a 67 year old male who returns for follow up appointment for chronic pain and medication refill. He states his pain is located in his neck radiating into his bilateral shoulders and mid-lower back. He rates his pain 3. His current exercise regime is walking.   Fred [name] Morphine Equivalent is 23.25 MME.   Last Oral Swab was Performed on 06/03/2017 it was consistent.   Pain Inventory Average Pain 4 Pain Right Now 3 My pain is stabbing, tingling and aching  In the last 24 hours, has pain interfered with the following? General activity 3 Relation with others 4 Enjoyment of life 4 What TIME of day is your pain at its worst? daytime Sleep (in general) Fair  Pain is worse with: walking, bending and standing Pain improves with: medication Relief from Meds: 5  Mobility walk without assistance ability to climb steps?  yes do you drive?  yes  Function disabled: date disabled .  Neuro/Psych numbness loss of taste or smell  Prior Studies Any changes since last visit?  no  Physicians involved in your care Any changes since last visit?  no   Family History  Problem Relation Age of Onset  . Stroke Mother   . Dementia Mother   . Heart disease Father   . Stroke Father        heat stroke  . Kidney disease Brother   . Dementia Brother    Social History   Socioeconomic History  . Marital status: Married    Spouse name: Not on file  . Number of children: 6  . Years of education: 12+  . Highest education level: Not on file  Occupational History  . Occupation: Retired  Scientific laboratory technician  . Financial resource strain: Not on file  . Food insecurity:    Worry: Not on file    Inability: Not on file  . Transportation needs:    Medical: Not on file    Non-medical: Not on file  Tobacco Use  . Smoking status: Former Smoker    Packs/day: 1.00   Years: 30.00    Pack years: 30.00    Types: Cigarettes    Last attempt to quit: 05/25/2001    Years since quitting: 16.2  . Smokeless tobacco: Former Systems developer    Quit date: 09/22/2001  Substance and Sexual Activity  . Alcohol use: No  . Drug use: No  . Sexual activity: Not on file  Lifestyle  . Physical activity:    Days per week: Not on file    Minutes per session: Not on file  . Stress: Not on file  Relationships  . Social connections:    Talks on phone: Not on file    Gets together: Not on file    Attends religious service: Not on file    Active member of club or organization: Not on file    Attends meetings of clubs or organizations: Not on file    Relationship status: Not on file  Other Topics Concern  . Not on file  Social History Narrative   Lives with wife   Caffeine use: Coffee daily (1-2 per day)   Some soda   Right handed    Past Surgical History:  Procedure Laterality Date  . BRAIN SURGERY     inserted rod to connect to BaHa(Hearing device)  . EYE  SURGERY     lasik  . LYMPHADENECTOMY Bilateral 08/04/2013   Procedure: LYMPHADENECTOMY;  Surgeon: Fred Gray, MD;  Location: [facility];  Service: Urology;  Laterality: Bilateral;  . PROSTATE SURGERY    . ROBOT ASSISTED LAPAROSCOPIC RADICAL PROSTATECTOMY N/A 08/04/2013   Procedure: ROBOTIC ASSISTED LAPAROSCOPIC RADICAL PROSTATECTOMY LEVEL 2;  Surgeon: Fred Gray, MD;  Location: [facility];  Service: Urology;  Laterality: N/A;  . SPINE SURGERY     cervical disc removed and stabalized with bone- good mobility   Past Medical History:  Diagnosis Date  . Cancer [facility])    prostate  . Depression   . Gait abnormality 07/22/2017  . Hearing loss   . History of kidney stones   . HOH (hard of hearing)   . Hyperlipidemia   . Hypertension   . Memory disorder 07/22/2017  . Neuromuscular disorder ([place])    There were no vitals taken for this visit.  Opioid Risk Score:   Fall Risk Score:  `1  Depression screen PHQ 2/9  Depression  screen [facility] 2/9 02/19/2017 12/12/2014 08/28/2014  Decreased Interest 0 0 2  Down, Depressed, Hopeless 0 0 1  PHQ - 2 Score 0 0 3  Altered sleeping - - 1  Tired, decreased energy - - 2  Change in appetite - - 0  Feeling bad or failure about yourself  - - 0  Trouble concentrating - - 1  Moving slowly or fidgety/restless - - 0  Suicidal thoughts - - 0  PHQ-9 Score - - 7     Review of Systems  Constitutional: Negative.   HENT: Negative.   Eyes: Negative.   Respiratory: Negative.   Cardiovascular: Negative.   Gastrointestinal: Negative.   Endocrine: Negative.   Genitourinary: Negative.   Musculoskeletal: Positive for arthralgias, back pain, myalgias and neck pain.  Skin: Negative.   Allergic/Immunologic: Negative.   Neurological: Negative.   Hematological: Negative.   Psychiatric/Behavioral: Negative.   All other systems reviewed and are negative.      Objective:   Physical Exam  Constitutional: He is oriented to person, place, and time. He appears well-developed and well-nourished.  HENT:  Head: Normocephalic and atraumatic.  Neck: Normal range of motion. Neck supple.  Cardiovascular: Normal rate and regular rhythm.  Pulmonary/Chest: Effort normal and breath sounds normal.  Musculoskeletal:  Normal Muscle Bulk and Muscle Testing Reveals: Upper Extremities: Full ROM and Muscle Strength 5/5 Bilateral AC Joint Tenderness Thoracic Paraspinal Tenderness: T-7-T-9 Lumbar Paraspinal Tenderness: L-3-L-5 Lower Extremities: Full ROM and Muscle Strength 5/5 Arises from Table with Ease Narrow Based Gait  Neurological: He is alert and oriented to person, place, and time.  Skin: Skin is warm and dry.  Psychiatric: He has a normal mood and affect.  Nursing note and vitals reviewed.         Assessment & Plan:  1.Cervical Postlaminectomy: Continue HEP and Continue current medication regime . 09/02/2017 2. Cervicalgia/ Cervical Radiculitis: Continue current medication regime.  Continue to Monitor. 09/02/2017 3. Chronic BilateralShoulder Pain: Continue HEP as Tolerated. 09/02/2017 4. L2 compression fracture/ Chronic Bilateral Low Back Pain without Sciatica: Continue to Monitor: 09/02/2017 Refilled:Oxycodone 5 mg one tablet every 8 hours as needed for moderate pain. # 90. We will Continue the opioid monitoring program. This consists of regular clinic visits, examinations, urine drug screen, pill counts as well as use of [place] controlled substance reporting System. 5. Polyarthralgia: Continue to Monitor. 09/02/2017 6. Muscle Spasm: Continue current medication regimen with : Flexeril.  20 minutes  of face to face patient care time was spent during this visit. All questions were encouraged and answered.  F/U in 63month

## 2017-10-01 ENCOUNTER — Encounter: Payer: Medicare Other | Attending: Registered Nurse | Admitting: Registered Nurse

## 2017-10-01 ENCOUNTER — Encounter: Payer: Self-pay | Admitting: Registered Nurse

## 2017-10-01 VITALS — BP 128/86 | HR 64 | Ht 67.0 in | Wt 176.0 lb

## 2017-10-01 DIAGNOSIS — M542 Cervicalgia: Secondary | ICD-10-CM | POA: Diagnosis not present

## 2017-10-01 DIAGNOSIS — Z87442 Personal history of urinary calculi: Secondary | ICD-10-CM | POA: Diagnosis not present

## 2017-10-01 DIAGNOSIS — Z5181 Encounter for therapeutic drug level monitoring: Secondary | ICD-10-CM | POA: Diagnosis not present

## 2017-10-01 DIAGNOSIS — G8929 Other chronic pain: Secondary | ICD-10-CM | POA: Diagnosis not present

## 2017-10-01 DIAGNOSIS — M25512 Pain in left shoulder: Secondary | ICD-10-CM | POA: Insufficient documentation

## 2017-10-01 DIAGNOSIS — G894 Chronic pain syndrome: Secondary | ICD-10-CM

## 2017-10-01 DIAGNOSIS — Z87891 Personal history of nicotine dependence: Secondary | ICD-10-CM | POA: Diagnosis not present

## 2017-10-01 DIAGNOSIS — Z981 Arthrodesis status: Secondary | ICD-10-CM | POA: Diagnosis not present

## 2017-10-01 DIAGNOSIS — Z8249 Family history of ischemic heart disease and other diseases of the circulatory system: Secondary | ICD-10-CM | POA: Insufficient documentation

## 2017-10-01 DIAGNOSIS — E785 Hyperlipidemia, unspecified: Secondary | ICD-10-CM | POA: Diagnosis not present

## 2017-10-01 DIAGNOSIS — H9190 Unspecified hearing loss, unspecified ear: Secondary | ICD-10-CM | POA: Insufficient documentation

## 2017-10-01 DIAGNOSIS — Z79899 Other long term (current) drug therapy: Secondary | ICD-10-CM | POA: Diagnosis not present

## 2017-10-01 DIAGNOSIS — M25511 Pain in right shoulder: Secondary | ICD-10-CM

## 2017-10-01 DIAGNOSIS — M961 Postlaminectomy syndrome, not elsewhere classified: Secondary | ICD-10-CM

## 2017-10-01 DIAGNOSIS — I10 Essential (primary) hypertension: Secondary | ICD-10-CM | POA: Diagnosis not present

## 2017-10-01 DIAGNOSIS — Z841 Family history of disorders of kidney and ureter: Secondary | ICD-10-CM | POA: Insufficient documentation

## 2017-10-01 DIAGNOSIS — Z823 Family history of stroke: Secondary | ICD-10-CM | POA: Insufficient documentation

## 2017-10-01 DIAGNOSIS — M5412 Radiculopathy, cervical region: Secondary | ICD-10-CM

## 2017-10-01 MED ORDER — OXYCODONE HCL 5 MG PO TABS: 5.0000 mg | ORAL_TABLET | Freq: Three times a day (TID) | ORAL | 0 refills | Status: DC | PRN

## 2017-10-01 NOTE — Progress Notes (Signed)
Subjective:    Patient ID: Fred [name], male    DOB: [birth year], 67 y.o.   MRN: [id number]  HPI; Fred [name] is a 67 year old male who returns for follow up appointment for chronic pain and medication refill. He states his pain is located in his neck radiating into his bilateral shoulders. He rates his pain 3. His current exercise regime is walking and performing stretching exercises.   [name] Morphine Equivalent is 23.25 MME. Last Oral Swab was Performed on 06/03/2017, it was consistent. Oral Swab Performed Today.   Pain Inventory Average Pain 3 Pain Right Now 3 My pain is tingling and aching  In the last 24 hours, has pain interfered with the following? General activity 4 Relation with others 3 Enjoyment of life 3 What TIME of day is your pain at its worst? evening Sleep (in general) Fair  Pain is worse with: walking, bending, sitting and standing Pain improves with: medication Relief from Meds: 5  Mobility walk with assistance  Function disabled: date disabled .  Neuro/Psych No problems in this area  Prior Studies Any changes since last visit?  no  Physicians involved in your care Any changes since last visit?  no   Family History  Problem Relation Age of Onset  . Stroke Mother   . Dementia Mother   . Heart disease Father   . Stroke Father        heat stroke  . Kidney disease Brother   . Dementia Brother    Social History   Socioeconomic History  . Marital status: Married    Spouse name: Not on file  . Number of children: 6  . Years of education: 12+  . Highest education level: Not on file  Occupational History  . Occupation: Retired  Scientific laboratory technician  . Financial resource strain: Not on file  . Food insecurity:    Worry: Not on file    Inability: Not on file  . Transportation needs:    Medical: Not on file    Non-medical: Not on file  Tobacco Use  . Smoking status: Former Smoker    Packs/day: 1.00    Years: 30.00    Pack  years: 30.00    Types: Cigarettes    Last attempt to quit: 05/25/2001    Years since quitting: 16.3  . Smokeless tobacco: Former Systems developer    Quit date: 09/22/2001  Substance and Sexual Activity  . Alcohol use: No  . Drug use: No  . Sexual activity: Not on file  Lifestyle  . Physical activity:    Days per week: Not on file    Minutes per session: Not on file  . Stress: Not on file  Relationships  . Social connections:    Talks on phone: Not on file    Gets together: Not on file    Attends religious service: Not on file    Active member of club or organization: Not on file    Attends meetings of clubs or organizations: Not on file    Relationship status: Not on file  Other Topics Concern  . Not on file  Social History Narrative   Lives with wife   Caffeine use: Coffee daily (1-2 per day)   Some soda   Right handed    Past Surgical History:  Procedure Laterality Date  . BRAIN SURGERY     inserted rod to connect to BaHa(Hearing device)  . EYE SURGERY     lasik  .  LYMPHADENECTOMY Bilateral 08/04/2013   Procedure: LYMPHADENECTOMY;  Surgeon: Dutch Gray, MD;  Location: [facility];  Service: Urology;  Laterality: Bilateral;  . PROSTATE SURGERY    . ROBOT ASSISTED LAPAROSCOPIC RADICAL PROSTATECTOMY N/A 08/04/2013   Procedure: ROBOTIC ASSISTED LAPAROSCOPIC RADICAL PROSTATECTOMY LEVEL 2;  Surgeon: Dutch Gray, MD;  Location: [facility];  Service: Urology;  Laterality: N/A;  . SPINE SURGERY     cervical disc removed and stabalized with bone- good mobility   Past Medical History:  Diagnosis Date  . Cancer [facility])    prostate  . Depression   . Gait abnormality 07/22/2017  . Hearing loss   . History of kidney stones   . HOH (hard of hearing)   . Hyperlipidemia   . Hypertension   . Memory disorder 07/22/2017  . Neuromuscular disorder ([facility])    BP 128/86   Pulse 64   Ht 5\' 7"  (1.702 m)   Wt 176 lb (79.8 kg)   SpO2 97%   BMI 27.57 kg/m   Opioid Risk Score:   Fall Risk Score:  `1  Depression  screen PHQ 2/9  Depression screen [facility] 2/9 02/19/2017 12/12/2014 08/28/2014  Decreased Interest 0 0 2  Down, Depressed, Hopeless 0 0 1  PHQ - 2 Score 0 0 3  Altered sleeping - - 1  Tired, decreased energy - - 2  Change in appetite - - 0  Feeling bad or failure about yourself  - - 0  Trouble concentrating - - 1  Moving slowly or fidgety/restless - - 0  Suicidal thoughts - - 0  PHQ-9 Score - - 7     Review of Systems  Constitutional: Negative.   HENT: Negative.   Eyes: Negative.   Respiratory: Negative.   Cardiovascular: Negative.   Gastrointestinal: Negative.   Endocrine: Negative.   Genitourinary: Negative.   Musculoskeletal: Positive for arthralgias, back pain and myalgias.  Skin: Negative.   Allergic/Immunologic: Negative.   Neurological: Negative.   Hematological: Negative.   Psychiatric/Behavioral: Negative.   All other systems reviewed and are negative.      Objective:   Physical Exam  Constitutional: He is oriented to person, place, and time. He appears well-developed and well-nourished.  HENT:  Head: Normocephalic and atraumatic.  Neck: Normal range of motion. Neck supple.  Cervical Paraspinal Tenderness: C-5-C-6  Cardiovascular: Normal rate and regular rhythm.  Pulmonary/Chest: Effort normal and breath sounds normal.  Musculoskeletal:  Normal Muscle Bulk and Muscle Testing Reveals: Upper Extremities: Decreased ROM 90 Degrees and Muscle Strength 4/5 Bilateral AC Joint Tenderness Lower Extremities: Full ROM and Muscle Strength 5/5 Arises from Table with Ease Narrow Based Gait  Neurological: He is alert and oriented to person, place, and time.  Skin: Skin is warm and dry.  Psychiatric: He has a normal mood and affect.  Nursing note and vitals reviewed.         Assessment & Plan:  1.Cervical Postlaminectomy: Continue HEP and Continue current medication regime . 10/01/2017 2. Cervicalgia/ Cervical Radiculitis: Continue current medication regime. Continue  to Monitor. 10/01/2017 3. Chronic BilateralShoulder Pain: Continue HEP as Tolerated. 10/01/2017 4. L2 compression fracture/ Chronic Bilateral Low Back Pain without Sciatica: Continue to Monitor: 10/01/2017 Refilled:Oxycodone 5 mg one tablet every 8hours as needed for moderate pain. # 90. We will Continue the opioid monitoring program. This consists of regular clinic visits, examinations, urine drug screen, pill counts as well as use of [place] controlled substance reporting System. 5. Polyarthralgia: Continue to Monitor. 10/01/2017 6. Muscle Spasm:Continue  current medication regimen with: Flexeril.  20 minutes of face to face patient care time was spent during this visit. All questions were encouraged and answered.  F/U in 11month

## 2017-10-04 LAB — DRUG TOX MONITOR 1 W/CONF, ORAL FLD
Amphetamines: NEGATIVE ng/mL (ref <10)
BUPRENORPHINE: NEGATIVE ng/mL (ref <0.10)
Barbiturates: NEGATIVE ng/mL (ref <10)
Benzodiazepines: NEGATIVE ng/mL (ref <0.50)
CODEINE: NEGATIVE ng/mL (ref <2.5)
Cocaine: NEGATIVE ng/mL (ref <5.0)
Dihydrocodeine: NEGATIVE ng/mL (ref <2.5)
FENTANYL: NEGATIVE ng/mL (ref <0.10)
HEROIN METABOLITE: NEGATIVE ng/mL (ref <1.0)
Hydrocodone: NEGATIVE ng/mL (ref <2.5)
Hydromorphone: NEGATIVE ng/mL (ref <2.5)
MARIJUANA: NEGATIVE ng/mL (ref <2.5)
MDMA: NEGATIVE ng/mL (ref <10)
METHADONE: NEGATIVE ng/mL (ref <5.0)
Meprobamate: NEGATIVE ng/mL (ref <2.5)
Morphine: NEGATIVE ng/mL (ref <2.5)
NOROXYCODONE: 34.7 ng/mL — AB (ref <2.5)
Nicotine Metabolite: NEGATIVE ng/mL (ref <5.0)
Norhydrocodone: NEGATIVE ng/mL (ref <2.5)
OPIATES: POSITIVE ng/mL — AB (ref <2.5)
Oxymorphone: NEGATIVE ng/mL (ref <2.5)
PHENCYCLIDINE: NEGATIVE ng/mL (ref <10)
TAPENTADOL: NEGATIVE ng/mL (ref <5.0)
Tramadol: NEGATIVE ng/mL (ref <5.0)
Zolpidem: NEGATIVE ng/mL (ref <5.0)

## 2017-10-04 LAB — DRUG TOX ALC METAB W/CON, ORAL FLD: Alcohol Metabolite: NEGATIVE ng/mL (ref <25)

## 2017-10-05 ENCOUNTER — Telehealth: Payer: Self-pay | Admitting: *Deleted

## 2017-10-05 NOTE — Telephone Encounter (Signed)
Oral swab drug screen was consistent for prescribed medications.  ?

## 2017-11-03 ENCOUNTER — Encounter: Payer: Self-pay | Admitting: Registered Nurse

## 2017-11-03 ENCOUNTER — Other Ambulatory Visit: Payer: Self-pay

## 2017-11-03 ENCOUNTER — Encounter: Payer: Medicare Other | Attending: Registered Nurse | Admitting: Registered Nurse

## 2017-11-03 VITALS — BP 130/87 | HR 68 | Ht 68.0 in | Wt 177.4 lb

## 2017-11-03 DIAGNOSIS — Z841 Family history of disorders of kidney and ureter: Secondary | ICD-10-CM | POA: Insufficient documentation

## 2017-11-03 DIAGNOSIS — G894 Chronic pain syndrome: Secondary | ICD-10-CM

## 2017-11-03 DIAGNOSIS — Z8249 Family history of ischemic heart disease and other diseases of the circulatory system: Secondary | ICD-10-CM | POA: Insufficient documentation

## 2017-11-03 DIAGNOSIS — Z5181 Encounter for therapeutic drug level monitoring: Secondary | ICD-10-CM | POA: Diagnosis not present

## 2017-11-03 DIAGNOSIS — M961 Postlaminectomy syndrome, not elsewhere classified: Secondary | ICD-10-CM

## 2017-11-03 DIAGNOSIS — Z87891 Personal history of nicotine dependence: Secondary | ICD-10-CM | POA: Insufficient documentation

## 2017-11-03 DIAGNOSIS — Z79899 Other long term (current) drug therapy: Secondary | ICD-10-CM | POA: Diagnosis not present

## 2017-11-03 DIAGNOSIS — I10 Essential (primary) hypertension: Secondary | ICD-10-CM | POA: Diagnosis not present

## 2017-11-03 DIAGNOSIS — H9190 Unspecified hearing loss, unspecified ear: Secondary | ICD-10-CM | POA: Insufficient documentation

## 2017-11-03 DIAGNOSIS — M25511 Pain in right shoulder: Secondary | ICD-10-CM

## 2017-11-03 DIAGNOSIS — M545 Low back pain: Secondary | ICD-10-CM

## 2017-11-03 DIAGNOSIS — M25512 Pain in left shoulder: Secondary | ICD-10-CM

## 2017-11-03 DIAGNOSIS — Z87442 Personal history of urinary calculi: Secondary | ICD-10-CM | POA: Insufficient documentation

## 2017-11-03 DIAGNOSIS — Z981 Arthrodesis status: Secondary | ICD-10-CM | POA: Insufficient documentation

## 2017-11-03 DIAGNOSIS — M542 Cervicalgia: Secondary | ICD-10-CM

## 2017-11-03 DIAGNOSIS — G8929 Other chronic pain: Secondary | ICD-10-CM | POA: Diagnosis not present

## 2017-11-03 DIAGNOSIS — M5412 Radiculopathy, cervical region: Secondary | ICD-10-CM | POA: Diagnosis not present

## 2017-11-03 DIAGNOSIS — Z823 Family history of stroke: Secondary | ICD-10-CM | POA: Insufficient documentation

## 2017-11-03 DIAGNOSIS — E785 Hyperlipidemia, unspecified: Secondary | ICD-10-CM | POA: Insufficient documentation

## 2017-11-03 MED ORDER — OXYCODONE HCL 5 MG PO TABS: 5.0000 mg | ORAL_TABLET | Freq: Three times a day (TID) | ORAL | 0 refills | Status: DC | PRN

## 2017-11-03 NOTE — Progress Notes (Addendum)
Subjective:    Patient ID: Fred [name], male    DOB: 1950/11/19, 67 y.o.   MRN: [id number]  HPI: Fred [name] is a 67 year old male who returns for follow up appointment for chronic pain and medication refill. He states his pain is located in his  neck radiating into his bilateral shoulders. He rates his pain 3. His current exercise regime is walking.   Fred [name] Morphine Equivalent is 22.50 MME.   Pain Inventory Average Pain 3 Pain Right Now 3 My pain is constant and dull  In the last 24 hours, has pain interfered with the following? General activity 3 Relation with others 4 Enjoyment of life 3 What TIME of day is your pain at its worst? morning and evening Sleep (in general) Fair  Pain is worse with: walking, bending, sitting, standing and some activites Pain improves with: rest and medication Relief from Meds: 6  Mobility walk without assistance ability to climb steps?  yes  Function disabled: date disabled n/a  Neuro/Psych numbness loss of taste or smell  Prior Studies Any changes since last visit?  no  Physicians involved in your care Any changes since last visit?  no   Family History  Problem Relation Age of Onset  . Stroke Mother   . Dementia Mother   . Heart disease Father   . Stroke Father        heat stroke  . Kidney disease Brother   . Dementia Brother    Social History   Socioeconomic History  . Marital status: Married    Spouse name: Not on file  . Number of children: 6  . Years of education: 12+  . Highest education level: Not on file  Occupational History  . Occupation: Retired  Scientific laboratory technician  . Financial resource strain: Not on file  . Food insecurity:    Worry: Not on file    Inability: Not on file  . Transportation needs:    Medical: Not on file    Non-medical: Not on file  Tobacco Use  . Smoking status: Former Smoker    Packs/day: 1.00    Years: 30.00    Pack years: 30.00    Types: Cigarettes    Last  attempt to quit: 05/25/2001    Years since quitting: 16.4  . Smokeless tobacco: Former Systems developer    Quit date: 09/22/2001  Substance and Sexual Activity  . Alcohol use: No  . Drug use: No  . Sexual activity: Not on file  Lifestyle  . Physical activity:    Days per week: Not on file    Minutes per session: Not on file  . Stress: Not on file  Relationships  . Social connections:    Talks on phone: Not on file    Gets together: Not on file    Attends religious service: Not on file    Active member of club or organization: Not on file    Attends meetings of clubs or organizations: Not on file    Relationship status: Not on file  Other Topics Concern  . Not on file  Social History Narrative   Lives with wife   Caffeine use: Coffee daily (1-2 per day)   Some soda   Right handed    Past Surgical History:  Procedure Laterality Date  . BRAIN SURGERY     inserted rod to connect to BaHa(Hearing device)  . EYE SURGERY     lasik  . LYMPHADENECTOMY Bilateral  08/04/2013   Procedure: LYMPHADENECTOMY;  Surgeon: Dutch Gray, MD;  Location: [facility];  Service: Urology;  Laterality: Bilateral;  . PROSTATE SURGERY    . ROBOT ASSISTED LAPAROSCOPIC RADICAL PROSTATECTOMY N/A 08/04/2013   Procedure: ROBOTIC ASSISTED LAPAROSCOPIC RADICAL PROSTATECTOMY LEVEL 2;  Surgeon: Dutch Gray, MD;  Location: [facility];  Service: Urology;  Laterality: N/A;  . SPINE SURGERY     cervical disc removed and stabalized with bone- good mobility   Past Medical History:  Diagnosis Date  . Cancer [facility])    prostate  . Depression   . Gait abnormality 07/22/2017  . Hearing loss   . History of kidney stones   . HOH (hard of hearing)   . Hyperlipidemia   . Hypertension   . Memory disorder 07/22/2017  . Neuromuscular disorder ([facility])    BP 130/87   Pulse 68   Ht 5\' 8"  (1.727 m)   Wt 177 lb 6.4 oz (80.5 kg)   SpO2 95%   BMI 26.97 kg/m   Opioid Risk Score:   Fall Risk Score:  `1  Depression screen PHQ 2/9  Depression screen  [facility] 2/9 11/03/2017 02/19/2017 12/12/2014 08/28/2014  Decreased Interest 0 0 0 2  Down, Depressed, Hopeless 0 0 0 1  PHQ - 2 Score 0 0 0 3  Altered sleeping - - - 1  Tired, decreased energy - - - 2  Change in appetite - - - 0  Feeling bad or failure about yourself  - - - 0  Trouble concentrating - - - 1  Moving slowly or fidgety/restless - - - 0  Suicidal thoughts - - - 0  PHQ-9 Score - - - 7    Review of Systems  Constitutional: Negative.   HENT: Negative.   Eyes: Negative.   Respiratory: Negative.   Cardiovascular: Negative.   Gastrointestinal: Negative.   Endocrine: Negative.   Genitourinary: Negative.   Musculoskeletal: Negative.   Skin: Negative.   Allergic/Immunologic: Negative.   Neurological: Negative.   Hematological: Negative.   Psychiatric/Behavioral: Negative.   All other systems reviewed and are negative.      Objective:   Physical Exam  Constitutional: He is oriented to person, place, and time. He appears well-developed and well-nourished.  HENT:  Head: Normocephalic and atraumatic.  Neck: Normal range of motion. Neck supple.  Cervical Paraspinal Tenderness: C-5-C-6  Cardiovascular: Normal rate and regular rhythm.  Pulmonary/Chest: Effort normal and breath sounds normal.  Musculoskeletal:  Normal Muscle Bulk and Muscle Testing Reveals: Upper Extremities: Full ROM and Muscle Strength 5/5 Bilateral AC Joint Tenderness Thoracic Paraspinal Tenderness: T-7-T-9 Lumbar Paraspinal Tenderness: L-3-L-5 Lower Extremities: Full ROM and Muscle Strength 5/5 Arises from Table with ease Narrow Based Gait  Neurological: He is alert and oriented to person, place, and time.  Skin: Skin is warm and dry.  Psychiatric: He has a normal mood and affect.  Nursing note and vitals reviewed.         Assessment & Plan:  1.Cervical Postlaminectomy: Continue HEP and Continue current medication regime . 11/03/2017 2. Cervicalgia/ Cervical Radiculitis: Continue current  medication regime. Continue to Monitor. 11/03/2017 3. Chronic BilateralShoulder Pain: Continue HEP as Tolerated. 11/05/2017 4. L2 compression fracture/ Chronic Bilateral Low Back Pain without Sciatica: Continue to Monitor: 11/03/2017 Refilled:Oxycodone 5 mg one tablet every 8hours as needed for moderate pain. # 90. We will Continue the opioid monitoring program. This consists of regular clinic visits, examinations, urine drug screen, pill counts as well as use of [place] controlled  substance reporting System. 5. Polyarthralgia: Continue to Monitor. 11/03/2017 6. Muscle Spasm:Continuecurrent medication regimen with: Flexeril.11/03/2017  20 minutes of face to face patient care time was spent during this visit. All questions were encouraged and answered.  F/U in 28month

## 2017-12-01 ENCOUNTER — Encounter: Payer: Medicare Other | Attending: Registered Nurse | Admitting: Registered Nurse

## 2017-12-01 ENCOUNTER — Encounter: Payer: Self-pay | Admitting: Registered Nurse

## 2017-12-01 VITALS — BP 131/85 | HR 67 | Resp 14 | Ht 68.0 in | Wt 174.0 lb

## 2017-12-01 DIAGNOSIS — M542 Cervicalgia: Secondary | ICD-10-CM | POA: Diagnosis not present

## 2017-12-01 DIAGNOSIS — Z981 Arthrodesis status: Secondary | ICD-10-CM | POA: Insufficient documentation

## 2017-12-01 DIAGNOSIS — I10 Essential (primary) hypertension: Secondary | ICD-10-CM | POA: Insufficient documentation

## 2017-12-01 DIAGNOSIS — M2550 Pain in unspecified joint: Secondary | ICD-10-CM | POA: Diagnosis not present

## 2017-12-01 DIAGNOSIS — Z79899 Other long term (current) drug therapy: Secondary | ICD-10-CM | POA: Diagnosis not present

## 2017-12-01 DIAGNOSIS — G8929 Other chronic pain: Secondary | ICD-10-CM | POA: Diagnosis not present

## 2017-12-01 DIAGNOSIS — Z8249 Family history of ischemic heart disease and other diseases of the circulatory system: Secondary | ICD-10-CM | POA: Insufficient documentation

## 2017-12-01 DIAGNOSIS — Z87891 Personal history of nicotine dependence: Secondary | ICD-10-CM | POA: Insufficient documentation

## 2017-12-01 DIAGNOSIS — M961 Postlaminectomy syndrome, not elsewhere classified: Secondary | ICD-10-CM | POA: Diagnosis not present

## 2017-12-01 DIAGNOSIS — Z823 Family history of stroke: Secondary | ICD-10-CM | POA: Insufficient documentation

## 2017-12-01 DIAGNOSIS — M545 Low back pain, unspecified: Secondary | ICD-10-CM

## 2017-12-01 DIAGNOSIS — M25512 Pain in left shoulder: Secondary | ICD-10-CM | POA: Diagnosis not present

## 2017-12-01 DIAGNOSIS — G894 Chronic pain syndrome: Secondary | ICD-10-CM | POA: Diagnosis not present

## 2017-12-01 DIAGNOSIS — M25511 Pain in right shoulder: Secondary | ICD-10-CM | POA: Insufficient documentation

## 2017-12-01 DIAGNOSIS — M5412 Radiculopathy, cervical region: Secondary | ICD-10-CM

## 2017-12-01 DIAGNOSIS — H9190 Unspecified hearing loss, unspecified ear: Secondary | ICD-10-CM | POA: Insufficient documentation

## 2017-12-01 DIAGNOSIS — Z5181 Encounter for therapeutic drug level monitoring: Secondary | ICD-10-CM | POA: Diagnosis not present

## 2017-12-01 DIAGNOSIS — Z87442 Personal history of urinary calculi: Secondary | ICD-10-CM | POA: Insufficient documentation

## 2017-12-01 DIAGNOSIS — E785 Hyperlipidemia, unspecified: Secondary | ICD-10-CM | POA: Insufficient documentation

## 2017-12-01 DIAGNOSIS — Z841 Family history of disorders of kidney and ureter: Secondary | ICD-10-CM | POA: Insufficient documentation

## 2017-12-01 MED ORDER — OXYCODONE HCL 5 MG PO TABS: 5.0000 mg | ORAL_TABLET | Freq: Three times a day (TID) | ORAL | 0 refills | Status: DC | PRN

## 2017-12-01 NOTE — Progress Notes (Signed)
Subjective:    Patient ID: Fred [name], male    DOB: July 12, 1950, 67 y.o.   MRN: [id number]  HPI: Fred [name] is a 67 year old male who returns for follow up appointment for chronic pain and medication refill. He states his pain is located in his neck radiating into hi right shoulder and lower back. Also reports he has joint pain all over. His current exercise regime is walking and outside chores when weather permits.   Fred [name] Equivalent is 22.50 MME. Last Oral Swab was Performed on 10/01/2017, it was consistent.   Pain Inventory Average Pain 3 Pain Right Now 3 My pain is constant, stabbing and aching  In the last 24 hours, has pain interfered with the following? General activity 0 Relation with others 4 Enjoyment of life 0 What TIME of day is your pain at its worst? all Sleep (in general) Fair  Pain is worse with: walking, sitting and standing Pain improves with: rest and medication Relief from Meds: 5  Mobility walk without assistance  Function retired  Neuro/Psych loss of taste or smell  Prior Studies Any changes since last visit?  no  Physicians involved in your care Any changes since last visit?  no   Family History  Problem Relation Age of Onset  . Stroke Mother   . Dementia Mother   . Heart disease Father   . Stroke Father        heat stroke  . Kidney disease Brother   . Dementia Brother    Social History   Socioeconomic History  . Marital status: Married    Spouse name: Not on file  . Number of children: 6  . Years of education: 12+  . Highest education level: Not on file  Occupational History  . Occupation: Retired  Scientific laboratory technician  . Financial resource strain: Not on file  . Food insecurity:    Worry: Not on file    Inability: Not on file  . Transportation needs:    Medical: Not on file    Non-medical: Not on file  Tobacco Use  . Smoking status: Former Smoker    Packs/day: 1.00    Years: 30.00    Pack years:  30.00    Types: Cigarettes    Last attempt to quit: 05/25/2001    Years since quitting: 16.5  . Smokeless tobacco: Former Systems developer    Quit date: 09/22/2001  Substance and Sexual Activity  . Alcohol use: No  . Drug use: No  . Sexual activity: Not on file  Lifestyle  . Physical activity:    Days per week: Not on file    Minutes per session: Not on file  . Stress: Not on file  Relationships  . Social connections:    Talks on phone: Not on file    Gets together: Not on file    Attends religious service: Not on file    Active member of club or organization: Not on file    Attends meetings of clubs or organizations: Not on file    Relationship status: Not on file  Other Topics Concern  . Not on file  Social History Narrative   Lives with wife   Caffeine use: Coffee daily (1-2 per day)   Some soda   Right handed    Past Surgical History:  Procedure Laterality Date  . BRAIN SURGERY     inserted rod to connect to BaHa(Hearing device)  . EYE SURGERY  lasik  . LYMPHADENECTOMY Bilateral 08/04/2013   Procedure: LYMPHADENECTOMY;  Surgeon: Dutch Gray, MD;  Location: [facility];  Service: Urology;  Laterality: Bilateral;  . PROSTATE SURGERY    . ROBOT ASSISTED LAPAROSCOPIC RADICAL PROSTATECTOMY N/A 08/04/2013   Procedure: ROBOTIC ASSISTED LAPAROSCOPIC RADICAL PROSTATECTOMY LEVEL 2;  Surgeon: Dutch Gray, MD;  Location: [facility];  Service: Urology;  Laterality: N/A;  . SPINE SURGERY     cervical disc removed and stabalized with bone- good mobility   Past Medical History:  Diagnosis Date  . Cancer [facility])    prostate  . Depression   . Gait abnormality 07/22/2017  . Hearing loss   . History of kidney stones   . HOH (hard of hearing)   . Hyperlipidemia   . Hypertension   . Memory disorder 07/22/2017  . Neuromuscular disorder (HCC)    BP 131/85 (BP Location: Left Arm, Patient Position: Sitting, Cuff Size: Normal)   Pulse 67   Resp 14   Ht 5\' 8"  (1.727 m)   Wt 174 lb (78.9 kg)   SpO2 96%   BMI  26.46 kg/m   Opioid Risk Score:   Fall Risk Score:  `1  Depression screen PHQ 2/9  Depression screen [facility] 2/9 11/03/2017 02/19/2017 12/12/2014 08/28/2014  Decreased Interest 0 0 0 2  Down, Depressed, Hopeless 0 0 0 1  PHQ - 2 Score 0 0 0 3  Altered sleeping - - - 1  Tired, decreased energy - - - 2  Change in appetite - - - 0  Feeling bad or failure about yourself  - - - 0  Trouble concentrating - - - 1  Moving slowly or fidgety/restless - - - 0  Suicidal thoughts - - - 0  PHQ-9 Score - - - 7    Review of Systems  Constitutional: Negative.   HENT: Negative.   Eyes: Negative.   Respiratory: Negative.   Cardiovascular: Negative.   Gastrointestinal: Negative.   Endocrine: Negative.   Genitourinary: Negative.   Musculoskeletal: Positive for arthralgias, back pain and neck pain.  Skin: Negative.   Neurological: Negative.   Hematological: Negative.   Psychiatric/Behavioral: Negative.        Objective:   Physical Exam  Constitutional: He is oriented to person, place, and time. He appears well-developed and well-nourished.  HENT:  Head: Normocephalic and atraumatic.  Neck: Normal range of motion. Neck supple.  Cervical Paraspinal Tenderness: C-5-C-6  Cardiovascular: Normal rate and regular rhythm.  Pulmonary/Chest: Effort normal and breath sounds normal.  Musculoskeletal:  Normal Muscle Bulk and Muscle Testing Reveals:  Upper Extremities: Full ROM and Muscle Strength 5/5 Bilateral AC Joint Tenderness Lumbar Paraspinal Tenderness: L-3-L-5 Lower Extremities: Full ROM and Muscle Strength 5/5 Arises from chair with ease Narrow Based Gait  Neurological: He is alert and oriented to person, place, and time.  Skin: Skin is warm and dry.  Psychiatric: He has a normal mood and affect. His behavior is normal.  Nursing note and vitals reviewed.         Assessment & Plan:  1.Cervical Postlaminectomy: Continue HEP and Continue current medication regime . 12/01/2017 2.  Cervicalgia/ Cervical Radiculitis: Continue current medication regime. Continue to Monitor. 12/01/2017 3. Chronic BilateralShoulder Pain: Continue HEP as Tolerated. 12/01/2017 4. L2 compression fracture/ Chronic Bilateral Low Back Pain without Sciatica: Continue to Monitor: 12/01/2017 Refilled:Oxycodone 5 mg one tablet every 8hours as needed for moderate pain. # 90. We will Continue the opioid monitoring program. This consists of regular clinic visits, examinations,  urine drug screen, pill counts as well as use of [place] controlled substance reporting System. 5. Polyarthralgia: Continue to Monitor. 12/01/2017 6. Muscle Spasm:Continuecurrent medication regimen with: Flexeril. 12/01/2017  20 minutes of face to face patient care time was spent during this visit. All questions were encouraged and answered.  F/U in 25month

## 2017-12-02 ENCOUNTER — Emergency Department (HOSPITAL_COMMUNITY): Payer: Medicare Other

## 2017-12-02 ENCOUNTER — Encounter (HOSPITAL_COMMUNITY): Payer: Self-pay

## 2017-12-02 ENCOUNTER — Other Ambulatory Visit: Payer: Self-pay

## 2017-12-02 ENCOUNTER — Inpatient Hospital Stay (HOSPITAL_COMMUNITY)
Admission: EM | Admit: 2017-12-02 | Discharge: 2017-12-05 | DRG: 176 | Disposition: A | Discharge status: Home / Self Care | Payer: Medicare Other | Attending: Family Medicine | Admitting: Family Medicine

## 2017-12-02 DIAGNOSIS — Z8546 Personal history of malignant neoplasm of prostate: Secondary | ICD-10-CM

## 2017-12-02 DIAGNOSIS — Z885 Allergy status to narcotic agent status: Secondary | ICD-10-CM

## 2017-12-02 DIAGNOSIS — Z888 Allergy status to other drugs, medicaments and biological substances status: Secondary | ICD-10-CM | POA: Diagnosis not present

## 2017-12-02 DIAGNOSIS — Z87442 Personal history of urinary calculi: Secondary | ICD-10-CM | POA: Diagnosis not present

## 2017-12-02 DIAGNOSIS — I2699 Other pulmonary embolism without acute cor pulmonale: Secondary | ICD-10-CM

## 2017-12-02 DIAGNOSIS — Z87891 Personal history of nicotine dependence: Secondary | ICD-10-CM | POA: Diagnosis not present

## 2017-12-02 DIAGNOSIS — F419 Anxiety disorder, unspecified: Secondary | ICD-10-CM | POA: Diagnosis present

## 2017-12-02 DIAGNOSIS — I361 Nonrheumatic tricuspid (valve) insufficiency: Secondary | ICD-10-CM | POA: Diagnosis not present

## 2017-12-02 DIAGNOSIS — I272 Pulmonary hypertension, unspecified: Secondary | ICD-10-CM | POA: Diagnosis present

## 2017-12-02 DIAGNOSIS — I10 Essential (primary) hypertension: Secondary | ICD-10-CM | POA: Diagnosis present

## 2017-12-02 DIAGNOSIS — C61 Malignant neoplasm of prostate: Secondary | ICD-10-CM | POA: Diagnosis not present

## 2017-12-02 DIAGNOSIS — R079 Chest pain, unspecified: Secondary | ICD-10-CM

## 2017-12-02 DIAGNOSIS — Z823 Family history of stroke: Secondary | ICD-10-CM

## 2017-12-02 DIAGNOSIS — F319 Bipolar disorder, unspecified: Secondary | ICD-10-CM | POA: Diagnosis present

## 2017-12-02 DIAGNOSIS — E785 Hyperlipidemia, unspecified: Secondary | ICD-10-CM | POA: Diagnosis present

## 2017-12-02 DIAGNOSIS — Z841 Family history of disorders of kidney and ureter: Secondary | ICD-10-CM

## 2017-12-02 DIAGNOSIS — Z8249 Family history of ischemic heart disease and other diseases of the circulatory system: Secondary | ICD-10-CM

## 2017-12-02 DIAGNOSIS — I2510 Atherosclerotic heart disease of native coronary artery without angina pectoris: Secondary | ICD-10-CM | POA: Diagnosis present

## 2017-12-02 HISTORY — DX: Other pulmonary embolism without acute cor pulmonale: I26.99

## 2017-12-02 LAB — I-STAT TROPONIN, ED: Troponin i, poc: 0 ng/mL (ref 0.00–0.08)

## 2017-12-02 LAB — CBC
HCT: 41 % (ref 39.0–52.0)
Hemoglobin: 13.4 g/dL (ref 13.0–17.0)
MCH: 28.8 pg (ref 26.0–34.0)
MCHC: 32.7 g/dL (ref 30.0–36.0)
MCV: 88.2 fL (ref 78.0–100.0)
PLATELETS: 224 10*3/uL (ref 150–400)
RBC: 4.65 MIL/uL (ref 4.22–5.81)
RDW: 13.3 % (ref 11.5–15.5)
WBC: 10.2 10*3/uL (ref 4.0–10.5)

## 2017-12-02 LAB — BASIC METABOLIC PANEL
Anion gap: 7 (ref 5–15)
BUN: 16 mg/dL (ref 8–23)
CHLORIDE: 103 mmol/L (ref 98–111)
CO2: 30 mmol/L (ref 22–32)
CREATININE: 1.18 mg/dL (ref 0.61–1.24)
Calcium: 9.1 mg/dL (ref 8.9–10.3)
GFR calc Af Amer: >60 mL/min (ref >60)
GFR calc non Af Amer: >60 mL/min (ref >60)
GLUCOSE: 91 mg/dL (ref 70–99)
Potassium: 3.9 mmol/L (ref 3.5–5.1)
Sodium: 140 mmol/L (ref 135–145)

## 2017-12-02 LAB — D-DIMER, QUANTITATIVE: D-Dimer, Quant: 1.43 ug/mL-FEU — ABNORMAL HIGH (ref 0.00–0.50)

## 2017-12-02 LAB — TROPONIN I

## 2017-12-02 MED ORDER — ONDANSETRON HCL 4 MG/2ML IJ SOLN: 4.0000 mg | Freq: Four times a day (QID) | INTRAMUSCULAR | Status: DC | PRN

## 2017-12-02 MED ORDER — LIDOCAINE 5 % EX PTCH
1.0000 | MEDICATED_PATCH | CUTANEOUS | Status: DC
  Administered 2017-12-02 – 2017-12-04 (×3): 1 via TRANSDERMAL
  Filled 2017-12-02 (×3): qty 1

## 2017-12-02 MED ORDER — IOPAMIDOL (ISOVUE-370) INJECTION 76%
100.0000 mL | Freq: Once | INTRAVENOUS | Status: AC | PRN
  Administered 2017-12-02: 100 mL via INTRAVENOUS

## 2017-12-02 MED ORDER — ACETAMINOPHEN 325 MG PO TABS
650.0000 mg | ORAL_TABLET | Freq: Four times a day (QID) | ORAL | Status: DC | PRN
  Administered 2017-12-03: 650 mg via ORAL
  Filled 2017-12-02: qty 2

## 2017-12-02 MED ORDER — HEPARIN BOLUS VIA INFUSION
2200.0000 [IU] | Freq: Once | INTRAVENOUS | Status: AC
  Administered 2017-12-02: 2200 [IU] via INTRAVENOUS
  Filled 2017-12-02: qty 2200

## 2017-12-02 MED ORDER — OXYCODONE-ACETAMINOPHEN 7.5-325 MG PO TABS: 1.0000 | ORAL_TABLET | ORAL | Status: DC | PRN

## 2017-12-02 MED ORDER — HEPARIN (PORCINE) IN NACL 100-0.45 UNIT/ML-% IJ SOLN
1300.0000 [IU]/h | INTRAMUSCULAR | Status: DC
Start: 2017-12-02 — End: 2017-12-04
  Administered 2017-12-02: 1300 [IU]/h via INTRAVENOUS
  Filled 2017-12-02 (×3): qty 250

## 2017-12-02 MED ORDER — DIPHENHYDRAMINE HCL 50 MG/ML IJ SOLN
25.0000 mg | Freq: Four times a day (QID) | INTRAMUSCULAR | Status: DC | PRN
  Administered 2017-12-03: 25 mg via INTRAVENOUS
  Filled 2017-12-02: qty 1

## 2017-12-02 MED ORDER — IOPAMIDOL (ISOVUE-370) INJECTION 76%
INTRAVENOUS | Status: AC
  Filled 2017-12-02: qty 100

## 2017-12-02 MED ORDER — KETOROLAC TROMETHAMINE 30 MG/ML IJ SOLN
15.0000 mg | Freq: Once | INTRAMUSCULAR | Status: AC
  Administered 2017-12-02: 15 mg via INTRAVENOUS
  Filled 2017-12-02: qty 1

## 2017-12-02 MED ORDER — HYDROMORPHONE HCL 1 MG/ML IJ SOLN
0.5000 mg | Freq: Four times a day (QID) | INTRAMUSCULAR | Status: DC | PRN
  Administered 2017-12-02: 0.5 mg via INTRAVENOUS
  Filled 2017-12-02: qty 1

## 2017-12-02 MED ORDER — HYDROMORPHONE HCL 1 MG/ML IJ SOLN
1.0000 mg | INTRAMUSCULAR | Status: DC | PRN
  Administered 2017-12-02 – 2017-12-04 (×11): 1 mg via INTRAVENOUS
  Filled 2017-12-02 (×12): qty 1

## 2017-12-02 MED ORDER — SODIUM CHLORIDE 0.9 % IV SOLN
INTRAVENOUS | Status: DC
  Administered 2017-12-02 – 2017-12-03 (×2): via INTRAVENOUS

## 2017-12-02 MED ORDER — ACETAMINOPHEN 650 MG RE SUPP: 650.0000 mg | Freq: Four times a day (QID) | RECTAL | Status: DC | PRN

## 2017-12-02 MED ORDER — MORPHINE SULFATE (PF) 4 MG/ML IV SOLN
4.0000 mg | Freq: Once | INTRAVENOUS | Status: AC
  Administered 2017-12-02: 4 mg via INTRAVENOUS
  Filled 2017-12-02: qty 1

## 2017-12-02 NOTE — ED Notes (Signed)
Patient transported to CT 

## 2017-12-02 NOTE — ED Triage Notes (Signed)
Pt reports that he has rt sided chest pain sharp 10/10 that comes and goes. Pt reports that pain started 8 hours ago. Pt reports some associated SOB however is breathing normal and unlabored. Pt wife is at bedside.

## 2017-12-02 NOTE — Progress Notes (Signed)
[facility] for Heparin Indication: pulmonary embolus  Allergies  Allergen Reactions  . Lyrica [Pregabalin] Other (See Comments)    Falls asleep while talking to people  . Percodan [Oxycodone-Aspirin] Hives and Nausea And Vomiting   Patient Measurements:   Heparin Dosing Weight: 78.9 kg  Vital Signs: Temp: 98.3 F (36.8 C) (07/17 1351) Temp Source: Oral (07/17 1351) BP: 111/85 (07/17 1817) Pulse Rate: 59 (07/17 1848)  Labs: Recent Labs    12/02/17 1517  HGB 13.4  HCT 41.0  PLT 224  CREATININE 1.18   Estimated Creatinine Clearance: 58 mL/min (by C-G formula based on SCr of 1.18 mg/dL).  Medical History: Past Medical History:  Diagnosis Date  . Cancer [facility])    prostate  . Depression   . Gait abnormality 07/22/2017  . Hearing loss   . History of kidney stones   . HOH (hard of hearing)   . Hyperlipidemia   . Hypertension   . Memory disorder 07/22/2017  . Neuromuscular disorder ([place])    Medications:  Scheduled:  . heparin  2,200 Units Intravenous Once  . iopamidol      . lidocaine  1 patch Transdermal Q24H   Infusions:  . heparin     Assessment: 81 yoM with sudden onset pleuritic chest pain, D-dimer elevated, CT/angio with multiple bilateral PE with small right sided pleural effusion. No recent surgery/travel/immobilization, leg swelling, hemoptysis, prior DVT/PE, or hormone use. He does have hx of prostate cancer which is in remission.   Today, 12/02/2017  Heparin bolus, infusion to begin in ED  Baseline CBC wnl  SCr 1.18, Cl ~ 60 ml/min  Goal of Therapy:  Heparin level 0.3-0.7 units/ml Monitor platelets by anticoagulation protocol: Yes   Plan:   Heparin bolus 2200 units, infusion to begin at 1300 units/hr  1st Heparin level at 0200 tomorrow  Daily CBC while on Heparin  Minda Ditto PharmD Pager [phone] 12/02/2017, 7:07 PM

## 2017-12-02 NOTE — ED Provider Notes (Signed)
[place] DEPT Provider Note   CSN: [id number] Arrival date & time: 12/02/17  1326     History   Chief Complaint Chief Complaint  Patient presents with  . Chest Pain    HPI Fred [name] is a 67 y.o. male.  HPI' Patient presents to the emergency department with right-sided chest pain that started earlier this morning.  The patient states that it is sharp pain that comes and goes worse with deep breathing and certain movements and palpation.  The patient states he never had pain like this in the past.  He did take a Percocet at home with no relief of his symptoms.  Patient states that the pain does not seem to radiate to the back or abdomen.  Patient denies any trauma . the pain is over the right side of the ribs and lateral rib cage.  The patient denies shortness of breath, headache,blurred vision, neck pain, fever, cough, weakness, numbness, dizziness, anorexia, edema, abdominal pain, nausea, vomiting, diarrhea, rash, back pain, dysuria, hematemesis, bloody stool, near syncope, or syncope. Past Medical History:  Diagnosis Date  . Cancer [facility])    prostate  . Depression   . Gait abnormality 07/22/2017  . Hearing loss   . History of kidney stones   . HOH (hard of hearing)   . Hyperlipidemia   . Hypertension   . Memory disorder 07/22/2017  . Neuromuscular disorder [facility])     Patient Active Problem List   Diagnosis Date Noted  . Memory disorder 07/22/2017  . Gait abnormality 07/22/2017  . Chronic low back pain 11/13/2014  . Prostate cancer ([place]) 08/04/2013  . Compression fracture of L2 lumbar vertebra (HCC) 05/03/2013  . Cervical postlaminectomy syndrome 09/23/2011  . Cervical stenosis of spinal canal 09/23/2011    Past Surgical History:  Procedure Laterality Date  . BRAIN SURGERY     inserted rod to connect to BaHa(Hearing device)  . EYE SURGERY     lasik  . LYMPHADENECTOMY Bilateral 08/04/2013   Procedure: LYMPHADENECTOMY;  Surgeon: Dutch Gray, MD;  Location: [facility];  Service: Urology;  Laterality: Bilateral;  . PROSTATE SURGERY    . ROBOT ASSISTED LAPAROSCOPIC RADICAL PROSTATECTOMY N/A 08/04/2013   Procedure: ROBOTIC ASSISTED LAPAROSCOPIC RADICAL PROSTATECTOMY LEVEL 2;  Surgeon: Dutch Gray, MD;  Location: [facility];  Service: Urology;  Laterality: N/A;  . SPINE SURGERY     cervical disc removed and stabalized with bone- good mobility        Home Medications    Prior to Admission medications   Medication Sig Start Date End Date Taking? Authorizing Provider  atorvastatin (LIPITOR) 40 MG tablet Take 40 mg by mouth daily. 11/10/17  Yes [provider]  donepezil (ARICEPT) 10 MG tablet Take 10 mg by mouth daily. 11/10/17  Yes [provider]  DULoxetine (CYMBALTA) 60 MG capsule Take 60 mg by mouth at bedtime.    Yes [provider]  lisinopril-hydrochlorothiazide (PRINZIDE,ZESTORETIC) 20-12.5 MG per tablet Take 1 tablet by mouth every morning.    Yes [provider]  oxyCODONE (OXY IR/ROXICODONE) 5 MG immediate release tablet Take 1 tablet (5 mg total) by mouth every 8 (eight) hours as needed for severe pain. 12/01/17  Yes Bayard Hugger, NP    Family History Family History  Problem Relation Age of Onset  . Stroke Mother   . Dementia Mother   . Heart disease Father   . Stroke Father        heat stroke  .  Kidney disease Brother   . Dementia Brother     Social History Social History   Tobacco Use  . Smoking status: Former Smoker    Packs/day: 1.00    Years: 30.00    Pack years: 30.00    Types: Cigarettes    Last attempt to quit: 05/25/2001    Years since quitting: 16.5  . Smokeless tobacco: Former Systems developer    Quit date: 09/22/2001  Substance Use Topics  . Alcohol use: No  . Drug use: No     Allergies   Lyrica [pregabalin] and Percodan [oxycodone-aspirin]   Review of Systems Review of Systems  All other systems negative except as documented in the HPI. All pertinent  positives and negatives as reviewed in the HPI. Physical Exam Updated Vital Signs BP 129/89 (BP Location: Right Arm)   Pulse 72   Temp 98.3 F (36.8 C) (Oral)   Resp (!) 23   SpO2 95%   Physical Exam  Constitutional: He is oriented to person, place, and time. He appears well-developed and well-nourished. No distress.  HENT:  Head: Normocephalic and atraumatic.  Mouth/Throat: Oropharynx is clear and moist.  Eyes: Pupils are equal, round, and reactive to light.  Neck: Normal range of motion. Neck supple.  Cardiovascular: Normal rate, regular rhythm and normal heart sounds. Exam reveals no gallop and no friction rub.  No murmur heard. Pulmonary/Chest: Effort normal and breath sounds normal. No respiratory distress. He has no decreased breath sounds. He has no wheezes. He exhibits tenderness.    Abdominal: Soft. Bowel sounds are normal. He exhibits no distension. There is no tenderness.  Neurological: He is alert and oriented to person, place, and time. He exhibits normal muscle tone. Coordination normal.  Skin: Skin is warm and dry. Capillary refill takes less than 2 seconds. No rash noted. No erythema.  Psychiatric: He has a normal mood and affect. His behavior is normal.  Nursing note and vitals reviewed.    ED Treatments / Results  Labs (all labs ordered are listed, but only abnormal results are displayed) Labs Reviewed  BASIC METABOLIC PANEL  CBC  D-DIMER, QUANTITATIVE (NOT AT [facility])  I-STAT TROPONIN, ED    EKG EKG Interpretation  Date/Time:  Wednesday December 02 2017 13:49:00 EDT Ventricular Rate:  68 PR Interval:    QRS Duration: 105 QT Interval:  386 QTC Calculation: 411 R Axis:   -31 Text Interpretation:  Sinus rhythm Left axis deviation ST-t wave abnormality Artifact Baseline wander Abnormal ekg Confirmed by Carmin Muskrat [phone]) on 12/02/2017 2:39:41 PM   Radiology Dg Chest 2 View  Result Date: 12/02/2017 CLINICAL DATA:  Right-sided chest pain beginning  this morning. EXAM: CHEST - 2 VIEW COMPARISON:  Two-view chest x-ray 04/18/2014. FINDINGS: Heart size is normal. Bibasilar airspace disease is present. Effusions are not excluded. Lung volumes are low. The visualized soft tissues and bony thorax are unremarkable. IMPRESSION: 1. Bibasilar airspace disease with concern for bilateral lower lobe pneumonia, left greater than right. 2. Low lung volumes. Electronically Signed   By: San Morelle M.D.   On: 12/02/2017 14:42    Procedures Procedures (including critical care time)  Medications Ordered in ED Medications  morphine 4 MG/ML injection 4 mg (has no administration in time range)     Initial Impression / Assessment and Plan / ED Course  I have reviewed the triage vital signs and the nursing notes.  Pertinent labs & imaging results that were available during my care of the patient were reviewed by  me and considered in my medical decision making (see chart for details).     Patient is in significant pain and we will give IV pain medications.  It is palpable right-sided chest pain that is worse with certain movement palpation and deep breathing.  Patient with our plan and all questions were answered at this point.  Final Clinical Impressions(s) / ED Diagnoses   Final diagnoses:  None    ED Discharge Orders    None       Dalia Heading, PA-C 12/03/17 1519    Carmin Muskrat, MD 12/04/17 [phone]

## 2017-12-02 NOTE — H&P (Addendum)
TRH H&P   Patient Demographics:    Fred [name], is a 67 y.o. male  MRN: [id number]   DOB - [birth year]  Admit Date - 12/02/2017  Outpatient Primary MD for the patient is Maury Dus, MD  Referring MD/NP/PA:   Janetta Hora  Outpatient Specialists:     Patient coming from:   home  Chief Complaint  Patient presents with  . Chest Pain      HPI:    Fred [name]  is a 67 y.o. male, w prostate cancer s/p prostatectomy, hypertension, hyperlipidemia, apparently presents with right sided chest pain and dyspnea starting earlier today.  Pt denies fever, chills, cough, palp, n/v, diarrhea, brbpr, black stool.   In ED,  Na 140, k 3.9, Bun 16, Creatinine 1.18 Wbc 10.2, hgb 13.4, Plt 224  D dimer 1.43 Trop 0.00  IMPRESSION: CT is positive for pulmonary emboli, with low volume emboli involving subsegmental branches of the left upper lobe and bilateral lower lobes.  Mixed ground-glass and nodular opacities the bilateral lobes with small right-sided pleural effusion may be reactive given the pulmonary emboli, or alternatively represent developing infection.  Two vessel coronary artery disease.  Pt will be admitted for PE.     Review of systems:    In addition to the HPI above,   No Fever-chills, No Headache, No changes with Vision or hearing, No problems swallowing food or Liquids,  No Abdominal pain, No Nausea or Vommitting, Bowel movements are regular, No Blood in stool or Urine, No dysuria, No new skin rashes or bruises, No new joints pains-aches,  No new weakness, tingling, numbness in any extremity, No recent weight gain or loss, No polyuria, polydypsia or polyphagia, No significant Mental Stressors.  A full 10 point Review of Systems was done, except as stated above, all other Review of Systems were negative.   With Past History of the following :     Past Medical History:  Diagnosis Date  . Cancer [facility])    prostate  . Depression   . Gait abnormality 07/22/2017  . Hearing loss   . History of kidney stones   . HOH (hard of hearing)   . Hyperlipidemia   . Hypertension   . Memory disorder 07/22/2017  . Neuromuscular disorder [facility])       Past Surgical History:  Procedure Laterality Date  . BRAIN SURGERY     inserted rod to connect to BaHa(Hearing device)  . EYE SURGERY     lasik  . LYMPHADENECTOMY Bilateral 08/04/2013   Procedure: LYMPHADENECTOMY;  Surgeon: Dutch Gray, MD;  Location: [facility];  Service: Urology;  Laterality: Bilateral;  . PROSTATE SURGERY    . ROBOT ASSISTED LAPAROSCOPIC RADICAL PROSTATECTOMY N/A 08/04/2013   Procedure: ROBOTIC ASSISTED LAPAROSCOPIC RADICAL PROSTATECTOMY LEVEL 2;  Surgeon: Dutch Gray, MD;  Location: [facility];  Service: Urology;  Laterality: N/A;  . SPINE SURGERY  cervical disc removed and stabalized with bone- good mobility      Social History:     Social History   Tobacco Use  . Smoking status: Former Smoker    Packs/day: 1.00    Years: 30.00    Pack years: 30.00    Types: Cigarettes    Last attempt to quit: 05/25/2001    Years since quitting: 16.5  . Smokeless tobacco: Former Systems developer    Quit date: 09/22/2001  Substance Use Topics  . Alcohol use: No     Lives - at home  Mobility - walks by self   Family History :     Family History  Problem Relation Age of Onset  . Stroke Mother   . Dementia Mother   . Heart disease Father   . Stroke Father        heat stroke  . Kidney disease Brother   . Dementia Brother       Home Medications:   Prior to Admission medications   Medication Sig Start Date End Date Taking? Authorizing Provider  atorvastatin (LIPITOR) 40 MG tablet Take 40 mg by mouth daily. 11/10/17  Yes [provider]  donepezil (ARICEPT) 10 MG tablet Take 10 mg by mouth daily. 11/10/17  Yes [provider]  DULoxetine (CYMBALTA) 60 MG capsule Take 60  mg by mouth at bedtime.    Yes [provider]  lisinopril-hydrochlorothiazide (PRINZIDE,ZESTORETIC) 20-12.5 MG per tablet Take 1 tablet by mouth every morning.    Yes [provider]  oxyCODONE (OXY IR/ROXICODONE) 5 MG immediate release tablet Take 1 tablet (5 mg total) by mouth every 8 (eight) hours as needed for severe pain. 12/01/17  Yes Bayard Hugger, NP     Allergies:     Allergies  Allergen Reactions  . Lyrica [Pregabalin] Other (See Comments)    Falls asleep while talking to people  . Percodan [Oxycodone-Aspirin] Hives and Nausea And Vomiting     Physical Exam:   Vitals    Blood pressure 128/80, pulse (!) 53, temperature 98.3 F (36.8 C), temperature source Oral, resp. rate 13, SpO2 97 %.   1. General  lying in bed in NAD,   2. Normal affect and insight, Not Suicidal or Homicidal, Awake Alert, Oriented X 3.  3. No F.N deficits, ALL C.Nerves Intact, Strength 5/5 all 4 extremities, Sensation intact all 4 extremities, Plantars down going.  4. Ears and Eyes appear Normal, Conjunctivae clear, PERRLA. Moist Oral Mucosa.  5. Supple Neck, No JVD, No cervical lymphadenopathy appriciated, No Carotid Bruits.  6. Symmetrical Chest wall movement, Good air movement bilaterally, CTAB.  7. RRR, No Gallops, Rubs or Murmurs, No Parasternal Heave.  8. Positive Bowel Sounds, Abdomen Soft, No tenderness, No organomegaly appriciated,No rebound -guarding or rigidity.  9.  No Cyanosis, Normal Skin Turgor, No Skin Rash or Bruise.  10. Good muscle tone,  joints appear normal , no effusions, Normal ROM.  11. No Palpable Lymph Nodes in Neck or Axillae      Data Review:    CBC Recent Labs  Lab 12/02/17 1517  WBC 10.2  HGB 13.4  HCT 41.0  PLT 224  MCV 88.2  MCH 28.8  MCHC 32.7  RDW 13.3   ------------------------------------------------------------------------------------------------------------------  Chemistries  Recent Labs  Lab 12/02/17 1517  NA  140  K 3.9  CL 103  CO2 30  GLUCOSE 91  BUN 16  CREATININE 1.18  CALCIUM 9.1   ------------------------------------------------------------------------------------------------------------------ estimated creatinine clearance is 58 mL/min (  by C-G formula based on SCr of 1.18 mg/dL). ------------------------------------------------------------------------------------------------------------------ No results for input(s): TSH, T4TOTAL, T3FREE, THYROIDAB in the last 72 hours.  Invalid input(s): FREET3  Coagulation profile No results for input(s): INR, PROTIME in the last 168 hours. ------------------------------------------------------------------------------------------------------------------- Recent Labs    12/02/17 1517  DDIMER 1.43*   -------------------------------------------------------------------------------------------------------------------  Cardiac Enzymes No results for input(s): CKMB, TROPONINI, MYOGLOBIN in the last 168 hours.  Invalid input(s): CK ------------------------------------------------------------------------------------------------------------------ No results found for: BNP   ---------------------------------------------------------------------------------------------------------------  Urinalysis    Component Value Date/Time   COLORURINE COLORLESS (A) 05/08/2017 1500   APPEARANCEUR CLEAR 05/08/2017 1500   LABSPEC 1.001 (L) 05/08/2017 1500   PHURINE 6.0 05/08/2017 1500   GLUCOSEU 50 (A) 05/08/2017 1500   HGBUR SMALL (A) 05/08/2017 1500   BILIRUBINUR NEGATIVE 05/08/2017 1500   KETONESUR NEGATIVE 05/08/2017 1500   PROTEINUR NEGATIVE 05/08/2017 1500   NITRITE NEGATIVE 05/08/2017 1500   LEUKOCYTESUR NEGATIVE 05/08/2017 1500    ----------------------------------------------------------------------------------------------------------------   Imaging Results:    Dg Chest 2 View  Result Date: 12/02/2017 CLINICAL DATA:  Right-sided chest pain  beginning this morning. EXAM: CHEST - 2 VIEW COMPARISON:  Two-view chest x-ray 04/18/2014. FINDINGS: Heart size is normal. Bibasilar airspace disease is present. Effusions are not excluded. Lung volumes are low. The visualized soft tissues and bony thorax are unremarkable. IMPRESSION: 1. Bibasilar airspace disease with concern for bilateral lower lobe pneumonia, left greater than right. 2. Low lung volumes. Electronically Signed   By: San Morelle M.D.   On: 12/02/2017 14:42   Ct Angio Chest Pe W/cm &/or Wo Cm  Result Date: 12/02/2017 CLINICAL DATA:  67 year old male with a history of right-sided chest pain starting 8 hours ago. EXAM: CT ANGIOGRAPHY CHEST WITH CONTRAST TECHNIQUE: Multidetector CT imaging of the chest was performed using the standard protocol during bolus administration of intravenous contrast. Multiplanar CT image reconstructions and MIPs were obtained to evaluate the vascular anatomy. CONTRAST:  153mL ISOVUE-370 IOPAMIDOL (ISOVUE-370) INJECTION 76% COMPARISON:  None. FINDINGS: Cardiovascular: Heart: No cardiomegaly. No pericardial fluid/thickening. Calcifications of the left anterior descending, circumflex coronary arteries. Ratio of the right ventricle to left ventricle remains below 1. No septal inversion. Aorta: Unremarkable course, caliber, contour of the thoracic aorta. No aneurysm or dissection flap. No periaortic fluid. Pulmonary arteries: No filling defects of the main pulmonary artery, lobar arteries, or segmental arteries. There are a few emboli identified within the subsegmental pulmonary arteries of the right lower lobe, left upper lobe, and left lower lobe. Mediastinum/Nodes: No mediastinal adenopathy. Unremarkable appearance of the thoracic inlet. Unremarkable course of the thoracic esophagus. Lungs/Pleura: Respiratory motion somewhat limits evaluation of the lung bases. Interlobular septal thickening with nodular opacities at the bilateral lung bases with mixed  ground-glass opacities. Trace right-sided pleural effusion. Calcified granuloma on the right within the major fissure (image 75 of series 6). Subpleural lymph node just distal to the granuloma. Upper Abdomen: No acute finding of the upper abdomen. Nonobstructive left-sided nephrolithiasis Musculoskeletal: No acute displaced fracture. Degenerative changes of the spine. Review of the MIP images confirms the above findings. IMPRESSION: CT is positive for pulmonary emboli, with low volume emboli involving subsegmental branches of the left upper lobe and bilateral lower lobes. Mixed ground-glass and nodular opacities the bilateral lobes with small right-sided pleural effusion may be reactive given the pulmonary emboli, or alternatively represent developing infection. These results were called by telephone at the time of interpretation on 12/02/2017 at 6:21 pm to Ms. KELLY GEKAS , who verbally acknowledged these results.  Two vessel coronary artery disease. Electronically Signed   By: Corrie Mckusick D.O.   On: 12/02/2017 18:24    nsr at 11, nl axis, no st-t changes c/w ischemia   Assessment & Plan:    Principal Problem:   Pulmonary embolism (HCC) Active Problems:   Prostate cancer (HCC)   Chest pain    Chest pain secondary to PE Pulmonary Embolism Tele Trop iq 6h x3 Check cardiac echo Heparin iv pharmacy to dose  CAD , by CT scan Please f/u with PCP to evaluate  Prostate cancer  F/u with [name]  Hyperlipidemia Cont Lipitor 40mg  po qhs  Hypertension Hold lisinopril/hydrochlorothiazide Hydralazine 10mg  iv q6h prn   Memory disorder Aricept 10mg  po qday  Anxiety Cont cymbalta   DVT Prophylaxis Heparin -  SCDs  AM Labs Ordered, also please review Full Orders  Family Communication: Admission, patients condition and plan of care including tests being ordered have been discussed with the patient  who indicate understanding and agree with the plan and Code Status.  Code Status  FULL CODE  Likely DC to  home  Condition GUARDED   Consults called:  None  Admission status: inpatient  Time spent in minutes : 70   Jani Gravel M.D on 12/02/2017 at 7:59 PM  Between 7am to 7pm - Pager - [phone]  . After 7pm go to [email or web address] - password [facility]  [facility] - Office  [phone]

## 2017-12-02 NOTE — ED Provider Notes (Signed)
67 year old male presents with acute onset of pleuritic chest pain while watching TV earlier today. The pain is severe. He took a Oxycodone without relief. He is hypertensive at times but other vitals are normal. Pain is worsened with deep breaths and palpation. EKG is NSR. CXR shows possible bilateral PNA but history is not consistent with this. Plan is to obtain non-contrast scan to further characterize abnormality on the xray and to obtain PE study if d-dimer is abnormal. He received IV Morphine for pain control.  5:07 PM D-dimer is elevated. Discussed with the patient and wife. They are agreeable to the scan. They want to know if he will be admitted for this. I told them I don't know yet and we will wait till work up is finished. His pain is not better with Morphine. Toradol and Lidocaine patch were ordered.  6:48 PM  CTA shows multiple bilateral PE with small right sided pleural effusion. No recent surgery/travel/immobilization, leg swelling, hemoptysis, prior DVT/PE, or hormone use. He does have hx of prostate cancer which is in remission. Pain is well controlled after Toradol. Heparin ordered. Discussed with Dr. Maudie Mercury with [facility] who will admit.   Recardo Evangelist, PA-C 12/02/17 1850    Dorie Rank, MD 12/02/17 2213

## 2017-12-02 NOTE — ED Triage Notes (Signed)
Per EMS-right chest wall pain at rest this am-no trauma/injury-no radiation-doesn't radiate-was watching TV at the time-patient took his home percocet which has given him no relief-NSR on the monitor/ekg

## 2017-12-03 ENCOUNTER — Inpatient Hospital Stay (HOSPITAL_COMMUNITY): Payer: Medicare Other

## 2017-12-03 DIAGNOSIS — I2699 Other pulmonary embolism without acute cor pulmonale: Principal | ICD-10-CM

## 2017-12-03 DIAGNOSIS — I361 Nonrheumatic tricuspid (valve) insufficiency: Secondary | ICD-10-CM

## 2017-12-03 LAB — COMPREHENSIVE METABOLIC PANEL
ALT: 20 U/L (ref 0–44)
ANION GAP: 7 (ref 5–15)
AST: 19 U/L (ref 15–41)
Albumin: 3.6 g/dL (ref 3.5–5.0)
Alkaline Phosphatase: 64 U/L (ref 38–126)
BUN: 18 mg/dL (ref 8–23)
CO2: 27 mmol/L (ref 22–32)
CREATININE: 1.2 mg/dL (ref 0.61–1.24)
Calcium: 8.8 mg/dL — ABNORMAL LOW (ref 8.9–10.3)
Chloride: 105 mmol/L (ref 98–111)
Glucose, Bld: 87 mg/dL (ref 70–99)
Potassium: 4 mmol/L (ref 3.5–5.1)
SODIUM: 139 mmol/L (ref 135–145)
TOTAL PROTEIN: 6.4 g/dL — AB (ref 6.5–8.1)
Total Bilirubin: 0.8 mg/dL (ref 0.3–1.2)

## 2017-12-03 LAB — BRAIN NATRIURETIC PEPTIDE: B Natriuretic Peptide: 33.9 pg/mL (ref 0.0–100.0)

## 2017-12-03 LAB — MRSA PCR SCREENING: MRSA by PCR: NEGATIVE

## 2017-12-03 LAB — HEPATIC FUNCTION PANEL
ALBUMIN: 3.8 g/dL (ref 3.5–5.0)
ALK PHOS: 65 U/L (ref 38–126)
ALT: 18 U/L (ref 0–44)
AST: 23 U/L (ref 15–41)
BILIRUBIN DIRECT: 0.2 mg/dL (ref 0.0–0.2)
BILIRUBIN INDIRECT: 0.8 mg/dL (ref 0.3–0.9)
BILIRUBIN TOTAL: 1 mg/dL (ref 0.3–1.2)
Total Protein: 6.7 g/dL (ref 6.5–8.1)

## 2017-12-03 LAB — CBC
HCT: 37.7 % — ABNORMAL LOW (ref 39.0–52.0)
HEMOGLOBIN: 12.6 g/dL — AB (ref 13.0–17.0)
MCH: 29.4 pg (ref 26.0–34.0)
MCHC: 33.4 g/dL (ref 30.0–36.0)
MCV: 88.1 fL (ref 78.0–100.0)
Platelets: 222 10*3/uL (ref 150–400)
RBC: 4.28 MIL/uL (ref 4.22–5.81)
RDW: 13.1 % (ref 11.5–15.5)
WBC: 7.2 10*3/uL (ref 4.0–10.5)

## 2017-12-03 LAB — RAPID URINE DRUG SCREEN, HOSP PERFORMED
Amphetamines: NOT DETECTED
Benzodiazepines: NOT DETECTED
COCAINE: NOT DETECTED
Opiates: POSITIVE — AB
TETRAHYDROCANNABINOL: NOT DETECTED

## 2017-12-03 LAB — TROPONIN I

## 2017-12-03 LAB — ECHOCARDIOGRAM COMPLETE

## 2017-12-03 LAB — LACTIC ACID, PLASMA: LACTIC ACID, VENOUS: 1.3 mmol/L (ref 0.5–1.9)

## 2017-12-03 LAB — HEPARIN LEVEL (UNFRACTIONATED)
Heparin Unfractionated: 0.58 IU/mL (ref 0.30–0.70)
Heparin Unfractionated: 0.65 IU/mL (ref 0.30–0.70)

## 2017-12-03 MED ORDER — OXYCODONE HCL 5 MG PO TABS
5.0000 mg | ORAL_TABLET | Freq: Three times a day (TID) | ORAL | Status: DC | PRN
  Administered 2017-12-03: 5 mg via ORAL
  Filled 2017-12-03: qty 1

## 2017-12-03 MED ORDER — PANTOPRAZOLE SODIUM 40 MG PO TBEC
40.0000 mg | DELAYED_RELEASE_TABLET | Freq: Once | ORAL | Status: AC
  Administered 2017-12-03: 40 mg via ORAL
  Filled 2017-12-03: qty 1

## 2017-12-03 MED ORDER — KETOROLAC TROMETHAMINE 15 MG/ML IJ SOLN
15.0000 mg | Freq: Once | INTRAMUSCULAR | Status: AC
  Administered 2017-12-03: 15 mg via INTRAVENOUS
  Filled 2017-12-03: qty 1

## 2017-12-03 MED ORDER — RIVAROXABAN 15 MG PO TABS
15.0000 mg | ORAL_TABLET | Freq: Two times a day (BID) | ORAL | Status: DC
  Administered 2017-12-04 – 2017-12-05 (×2): 15 mg via ORAL
  Filled 2017-12-03 (×2): qty 1

## 2017-12-03 MED ORDER — RIVAROXABAN 20 MG PO TABS: 20.0000 mg | ORAL_TABLET | Freq: Every day | ORAL | Status: DC

## 2017-12-03 MED ORDER — DULOXETINE HCL 60 MG PO CPEP
60.0000 mg | ORAL_CAPSULE | Freq: Every day | ORAL | Status: DC
  Administered 2017-12-03 – 2017-12-04 (×3): 60 mg via ORAL
  Filled 2017-12-03: qty 1
  Filled 2017-12-03 (×2): qty 2

## 2017-12-03 MED ORDER — DIPHENHYDRAMINE HCL 25 MG PO CAPS
25.0000 mg | ORAL_CAPSULE | Freq: Four times a day (QID) | ORAL | Status: DC | PRN
  Filled 2017-12-03: qty 1

## 2017-12-03 MED ORDER — OXYCODONE HCL 5 MG PO TABS
5.0000 mg | ORAL_TABLET | Freq: Four times a day (QID) | ORAL | Status: DC | PRN
  Administered 2017-12-03 – 2017-12-05 (×6): 5 mg via ORAL
  Filled 2017-12-03 (×6): qty 1

## 2017-12-03 MED ORDER — DONEPEZIL HCL 10 MG PO TABS
10.0000 mg | ORAL_TABLET | Freq: Every day | ORAL | Status: DC
  Administered 2017-12-03 – 2017-12-05 (×3): 10 mg via ORAL
  Filled 2017-12-03: qty 2
  Filled 2017-12-03 (×2): qty 1

## 2017-12-03 MED ORDER — KETOROLAC TROMETHAMINE 15 MG/ML IJ SOLN
15.0000 mg | Freq: Four times a day (QID) | INTRAMUSCULAR | Status: AC | PRN
  Administered 2017-12-03 – 2017-12-04 (×4): 15 mg via INTRAVENOUS
  Filled 2017-12-03 (×4): qty 1

## 2017-12-03 MED ORDER — DIPHENHYDRAMINE HCL 25 MG PO CAPS
25.0000 mg | ORAL_CAPSULE | ORAL | Status: DC | PRN
  Administered 2017-12-03 – 2017-12-04 (×4): 25 mg via ORAL
  Filled 2017-12-03 (×3): qty 1

## 2017-12-03 MED ORDER — ATORVASTATIN CALCIUM 40 MG PO TABS
40.0000 mg | ORAL_TABLET | Freq: Every day | ORAL | Status: DC
  Administered 2017-12-03 – 2017-12-05 (×3): 40 mg via ORAL
  Filled 2017-12-03 (×3): qty 1

## 2017-12-03 MED ORDER — HYDRALAZINE HCL 20 MG/ML IJ SOLN: 10.0000 mg | Freq: Four times a day (QID) | INTRAMUSCULAR | Status: DC | PRN

## 2017-12-03 MED ORDER — HYDROMORPHONE HCL 1 MG/ML IJ SOLN
1.0000 mg | Freq: Once | INTRAMUSCULAR | Status: AC
  Administered 2017-12-03: 1 mg via INTRAVENOUS

## 2017-12-03 NOTE — ED Notes (Signed)
Spoke to Dr. Hal Hope about pts pain, still writhing in the bed and inconsolable.

## 2017-12-03 NOTE — ED Notes (Addendum)
Pt crying stating that he cannot tolerate the pain, Dr. Maudie Mercury made aware.

## 2017-12-03 NOTE — ED Notes (Signed)
ED TO INPATIENT HANDOFF REPORT  Name/Age/Gender Fred [name] 67 y.o. male  Code Status    Code Status Orders  (From admission, onward)        Start     Ordered   12/02/17 2019  Full code  Continuous     12/02/17 2021    Code Status History    Date Active Date Inactive Code Status Order ID Comments User Context   08/04/2013 1503 08/05/2013 1637 Full Code [id number]  [name], MD Inpatient    Advance Directive Documentation     Most Recent Value  Type of Advance Directive  Healthcare Power of Attorney  Pre-existing out of facility DNR order (yellow form or pink MOST form)  -  "MOST" Form in Place?  -      Home/SNF/Other Home  Chief Complaint chest wall pain  Level of Care/Admitting Diagnosis ED Disposition    ED Disposition Condition Echo: [place] [[id number]]  Level of Care: Stepdown [14]  Admit to SDU based on following criteria: Cardiac Instability:  Patients experiencing chest pain, unconfirmed MI and stable, arrhythmias and CHF requiring medical management and potentially compromising patient's stability  Diagnosis: Pulmonary embolism [facility]) [[id number]]  Admitting Physician: Jani Gravel [phone]  Attending Physician: Jani Gravel [phone]  Estimated length of stay: past midnight tomorrow  Certification:: I certify this patient will need inpatient services for at least 2 midnights  PT Class (Do Not Modify): Inpatient [101]  PT Acc Code (Do Not Modify): Private [1]       Medical History Past Medical History:  Diagnosis Date  . Cancer [facility])    prostate  . Depression   . Gait abnormality 07/22/2017  . Hearing loss   . History of kidney stones   . HOH (hard of hearing)   . Hyperlipidemia   . Hypertension   . Memory disorder 07/22/2017  . Neuromuscular disorder (HCC)     Allergies Allergies  Allergen Reactions  . Lyrica [Pregabalin] Other (See Comments)    Falls asleep while talking to people  . Percodan  [Oxycodone-Aspirin] Hives and Nausea And Vomiting    IV Location/Drains/Wounds Patient Lines/Drains/Airways Status   Active Line/Drains/Airways    Name:   Placement date:   Placement time:   Site:   Days:   Peripheral IV 12/02/17 Right Antecubital   12/02/17    1516    Antecubital   1          Labs/Imaging Results for orders placed or performed during the hospital encounter of 12/02/17 (from the past 48 hour(s))  Basic metabolic panel     Status: None   Collection Time: 12/02/17  3:17 PM  Result Value Ref Range   Sodium 140 135 - 145 mmol/L   Potassium 3.9 3.5 - 5.1 mmol/L   Chloride 103 98 - 111 mmol/L    Comment: Please note change in reference range.   CO2 30 22 - 32 mmol/L   Glucose, Bld 91 70 - 99 mg/dL    Comment: Please note change in reference range.   BUN 16 8 - 23 mg/dL    Comment: Please note change in reference range.   Creatinine, Ser 1.18 0.61 - 1.24 mg/dL   Calcium 9.1 8.9 - 10.3 mg/dL   GFR calc non Af Amer >60 >60 mL/min   GFR calc Af Amer >60 >60 mL/min    Comment: (NOTE) The eGFR has been calculated using the CKD EPI equation. This  calculation has not been validated in all clinical situations. eGFR's persistently <60 mL/min signify possible Chronic Kidney Disease.    Anion gap 7 5 - 15    Comment: Performed at [facility], [place] [address] [place], [place] [address]  CBC     Status: None   Collection Time: 12/02/17  3:17 PM  Result Value Ref Range   WBC 10.2 4.0 - 10.5 K/uL   RBC 4.65 4.22 - 5.81 MIL/uL   Hemoglobin 13.4 13.0 - 17.0 g/dL   HCT 41.0 39.0 - 52.0 %   MCV 88.2 78.0 - 100.0 fL   MCH 28.8 26.0 - 34.0 pg   MCHC 32.7 30.0 - 36.0 g/dL   RDW 13.3 11.5 - 15.5 %   Platelets 224 150 - 400 K/uL    Comment: Performed at [facility], [place] [address] [place], [place] [address]  D-dimer, quantitative     Status: Abnormal   Collection Time: 12/02/17  3:17 PM  Result Value Ref Range   D-Dimer, Quant 1.43 (H)  0.00 - 0.50 ug/mL-FEU    Comment: (NOTE) At the manufacturer cut-off of 0.50 ug/mL FEU, this assay has been documented to exclude PE with a sensitivity and negative predictive value of 97 to 99%.  At this time, this assay has not been approved by the FDA to exclude DVT/VTE. Results should be correlated with clinical presentation. Performed at [facility], [place] [address], [place], [place] [address]   I-stat troponin, ED     Status: None   Collection Time: 12/02/17  3:33 PM  Result Value Ref Range   Troponin i, poc 0.00 0.00 - 0.08 ng/mL   Comment 3            Comment: Due to the release kinetics of cTnI, a negative result within the first hours of the onset of symptoms does not rule out myocardial infarction with certainty. If myocardial infarction is still suspected, repeat the test at appropriate intervals.   Troponin I     Status: None   Collection Time: 12/02/17  8:47 PM  Result Value Ref Range   Troponin I <0.03 <0.03 ng/mL    Comment: Performed at [facility], [place] [address], [place], [place] [address]  Heparin level (unfractionated)     Status: None   Collection Time: 12/03/17  2:32 AM  Result Value Ref Range   Heparin Unfractionated 0.58 0.30 - 0.70 IU/mL    Comment: (NOTE) If heparin results are below expected values, and patient dosage has  been confirmed, suggest follow up testing of antithrombin III levels. Performed at [facility], [place] [address], [place], [place] [address]   CBC     Status: Abnormal   Collection Time: 12/03/17  2:32 AM  Result Value Ref Range   WBC 7.2 4.0 - 10.5 K/uL   RBC 4.28 4.22 - 5.81 MIL/uL   Hemoglobin 12.6 (L) 13.0 - 17.0 g/dL   HCT 37.7 (L) 39.0 - 52.0 %   MCV 88.1 78.0 - 100.0 fL   MCH 29.4 26.0 - 34.0 pg   MCHC 33.4 30.0 - 36.0 g/dL   RDW 13.1 11.5 - 15.5 %   Platelets 222 150 - 400 K/uL    Comment: Performed at [facility], [place] [address],  [place],  [address]  Troponin I     Status: None   Collection Time: 12/03/17  2:32 AM  Result Value Ref Range  Troponin I <0.03 <0.03 ng/mL    Comment: Performed at [facility], [place] [address], [place], [place] [address]  Lactic acid, plasma     Status: None   Collection Time: 12/03/17  2:32 AM  Result Value Ref Range   Lactic Acid, Venous 1.3 0.5 - 1.9 mmol/L    Comment: Performed at [facility], [place] [address], [place], [place] [address]  Brain natriuretic peptide     Status: None   Collection Time: 12/03/17  2:32 AM  Result Value Ref Range   B Natriuretic Peptide 33.9 0.0 - 100.0 pg/mL    Comment: Performed at [facility], [place] [address] [place], [place] [address]  Hepatic function panel     Status: None   Collection Time: 12/03/17  2:32 AM  Result Value Ref Range   Total Protein 6.7 6.5 - 8.1 g/dL   Albumin 3.8 3.5 - 5.0 g/dL   AST 23 15 - 41 U/L   ALT 18 0 - 44 U/L    Comment: Please note change in reference range.   Alkaline Phosphatase 65 38 - 126 U/L   Total Bilirubin 1.0 0.3 - 1.2 mg/dL   Bilirubin, Direct 0.2 0.0 - 0.2 mg/dL    Comment: Please note change in reference range.   Indirect Bilirubin 0.8 0.3 - 0.9 mg/dL    Comment: Performed at [facility], [place] [address], [place], [place] [address]  Urine rapid drug screen (hosp performed)     Status: Abnormal   Collection Time: 12/03/17  6:23 AM  Result Value Ref Range   Opiates POSITIVE (A) NONE DETECTED   Cocaine NONE DETECTED NONE DETECTED   Benzodiazepines NONE DETECTED NONE DETECTED   Amphetamines NONE DETECTED NONE DETECTED   Tetrahydrocannabinol NONE DETECTED NONE DETECTED   Barbiturates (A) NONE DETECTED    Result not available. Reagent lot number recalled by manufacturer.    Comment: Performed at [facility], [place] [address] [place] [address], [place], [place] [address]  Comprehensive metabolic panel     Status: Abnormal    Collection Time: 12/03/17  8:19 AM  Result Value Ref Range   Sodium 139 135 - 145 mmol/L   Potassium 4.0 3.5 - 5.1 mmol/L   Chloride 105 98 - 111 mmol/L    Comment: Please note change in reference range.   CO2 27 22 - 32 mmol/L   Glucose, Bld 87 70 - 99 mg/dL    Comment: Please note change in reference range.   BUN 18 8 - 23 mg/dL    Comment: Please note change in reference range.   Creatinine, Ser 1.20 0.61 - 1.24 mg/dL   Calcium 8.8 (L) 8.9 - 10.3 mg/dL   Total Protein 6.4 (L) 6.5 - 8.1 g/dL   Albumin 3.6 3.5 - 5.0 g/dL   AST 19 15 - 41 U/L   ALT 20 0 - 44 U/L    Comment: Please note change in reference range.   Alkaline Phosphatase 64 38 - 126 U/L   Total Bilirubin 0.8 0.3 - 1.2 mg/dL   GFR calc non Af Amer >60 >60 mL/min   GFR calc Af Amer >60 >60 mL/min    Comment: (NOTE) The eGFR has been calculated using the CKD EPI equation. This calculation has not been validated in all clinical situations. eGFR's persistently <60 mL/min signify possible Chronic Kidney Disease.    Anion gap 7 5 - 15    Comment: Performed at [facility], [place] [address], [place], [place] [address]  Troponin I     Status: None   Collection Time: 12/03/17  8:19 AM  Result Value Ref Range   Troponin I <0.03 <0.03 ng/mL    Comment: Performed at [facility], [place] [address], [place], [place] [address]  Heparin level (unfractionated)     Status: None   Collection Time: 12/03/17  8:19 AM  Result Value Ref Range   Heparin Unfractionated 0.65 0.30 - 0.70 IU/mL    Comment: (NOTE) If heparin results are below expected values, and patient dosage has  been confirmed, suggest follow up testing of antithrombin III levels. Performed at [facility], [place] [address], [place], [place] [address]    Dg Chest 2 View  Result Date: 12/02/2017 CLINICAL DATA:  Right-sided chest pain beginning this morning. EXAM: CHEST - 2 VIEW COMPARISON:  Two-view chest x-ray  04/18/2014. FINDINGS: Heart size is normal. Bibasilar airspace disease is present. Effusions are not excluded. Lung volumes are low. The visualized soft tissues and bony thorax are unremarkable. IMPRESSION: 1. Bibasilar airspace disease with concern for bilateral lower lobe pneumonia, left greater than right. 2. Low lung volumes. Electronically Signed   By: San Morelle M.D.   On: 12/02/2017 14:42   Ct Angio Chest Pe W/cm &/or Wo Cm  Result Date: 12/02/2017 CLINICAL DATA:  67 year old male with a history of right-sided chest pain starting 8 hours ago. EXAM: CT ANGIOGRAPHY CHEST WITH CONTRAST TECHNIQUE: Multidetector CT imaging of the chest was performed using the standard protocol during bolus administration of intravenous contrast. Multiplanar CT image reconstructions and MIPs were obtained to evaluate the vascular anatomy. CONTRAST:  163m ISOVUE-370 IOPAMIDOL (ISOVUE-370) INJECTION 76% COMPARISON:  None. FINDINGS: Cardiovascular: Heart: No cardiomegaly. No pericardial fluid/thickening. Calcifications of the left anterior descending, circumflex coronary arteries. Ratio of the right ventricle to left ventricle remains below 1. No septal inversion. Aorta: Unremarkable course, caliber, contour of the thoracic aorta. No aneurysm or dissection flap. No periaortic fluid. Pulmonary arteries: No filling defects of the main pulmonary artery, lobar arteries, or segmental arteries. There are a few emboli identified within the subsegmental pulmonary arteries of the right lower lobe, left upper lobe, and left lower lobe. Mediastinum/Nodes: No mediastinal adenopathy. Unremarkable appearance of the thoracic inlet. Unremarkable course of the thoracic esophagus. Lungs/Pleura: Respiratory motion somewhat limits evaluation of the lung bases. Interlobular septal thickening with nodular opacities at the bilateral lung bases with mixed ground-glass opacities. Trace right-sided pleural effusion. Calcified granuloma on the  right within the major fissure (image 75 of series 6). Subpleural lymph node just distal to the granuloma. Upper Abdomen: No acute finding of the upper abdomen. Nonobstructive left-sided nephrolithiasis Musculoskeletal: No acute displaced fracture. Degenerative changes of the spine. Review of the MIP images confirms the above findings. IMPRESSION: CT is positive for pulmonary emboli, with low volume emboli involving subsegmental branches of the left upper lobe and bilateral lower lobes. Mixed ground-glass and nodular opacities the bilateral lobes with small right-sided pleural effusion may be reactive given the pulmonary emboli, or alternatively represent developing infection. These results were called by telephone at the time of interpretation on 12/02/2017 at 6:21 pm to Ms. KELLY GEKAS , who verbally acknowledged these results. Two vessel coronary artery disease. Electronically Signed   By: JCorrie MckusickD.O.   On: 12/02/2017 18:24   Dg Chest Port 1 View  Result Date: 12/03/2017 CLINICAL DATA:  67y/o  M; chest pain. EXAM: PORTABLE CHEST  1 VIEW COMPARISON:  12/02/2017 CT chest and chest radiograph. FINDINGS: Stable normal cardiac silhouette. Anterior cervical fusion hardware noted. Streaky opacities at the lung bases. No pneumothorax. Bowel super position over the liver. No acute osseous abnormality is evident. IMPRESSION: Streaky opacities at the lung bases may represent atelectasis or pneumonia. Electronically Signed   By: Kristine Garbe M.D.   On: 12/03/2017 04:22    Pending Labs Unresulted Labs (From admission, onward)   Start     Ordered   12/04/17 0500  CBC  Daily,   R     12/02/17 1904   12/04/17 0500  Heparin level (unfractionated)  Daily,   R     12/03/17 1007      Vitals/Pain Today's Vitals   12/03/17 1242 12/03/17 1330 12/03/17 1422 12/03/17 1430  BP:  106/73 113/76 92/66  Pulse:  (!) 49 (!) 49 (!) 52  Resp:  '15 16 15  ' Temp:      TempSrc:      SpO2:  93% 94% 99%   PainSc: 4        Isolation Precautions No active isolations  Medications Medications  lidocaine (LIDODERM) 5 % 1 patch (1 patch Transdermal Patch Removed 12/03/17 0545)  heparin ADULT infusion 100 units/mL (25000 units/227m sodium chloride 0.45%) (1,300 Units/hr Intravenous Rate/Dose Verify 12/03/17 1000)  acetaminophen (TYLENOL) tablet 650 mg (650 mg Oral Given 12/03/17 0114)    Or  acetaminophen (TYLENOL) suppository 650 mg ( Rectal See Alternative 12/03/17 0114)  ondansetron (ZOFRAN) injection 4 mg (has no administration in time range)  atorvastatin (LIPITOR) tablet 40 mg (40 mg Oral Given 12/03/17 1056)  donepezil (ARICEPT) tablet 10 mg (10 mg Oral Given 12/03/17 1049)  DULoxetine (CYMBALTA) DR capsule 60 mg (60 mg Oral Given 12/03/17 0114)  hydrALAZINE (APRESOLINE) injection 10 mg (has no administration in time range)  HYDROmorphone (DILAUDID) injection 1 mg (1 mg Intravenous Given 12/03/17 1203)  diphenhydrAMINE (BENADRYL) capsule 25 mg (25 mg Oral Given 12/03/17 1049)  ketorolac (TORADOL) 15 MG/ML injection 15 mg (15 mg Intravenous Given 12/03/17 1455)  oxyCODONE (Oxy IR/ROXICODONE) immediate release tablet 5 mg (has no administration in time range)  morphine 4 MG/ML injection 4 mg (4 mg Intravenous Given 12/02/17 1523)  ketorolac (TORADOL) 30 MG/ML injection 15 mg (15 mg Intravenous Given 12/02/17 1718)  iopamidol (ISOVUE-370) 76 % injection 100 mL (100 mLs Intravenous Contrast Given 12/02/17 1754)  heparin bolus via infusion 2,200 Units (2,200 Units Intravenous Bolus from Bag 12/02/17 1938)  HYDROmorphone (DILAUDID) injection 1 mg (1 mg Intravenous Given 12/03/17 0130)  pantoprazole (PROTONIX) EC tablet 40 mg (40 mg Oral Given 12/03/17 0603)  ketorolac (TORADOL) 15 MG/ML injection 15 mg (15 mg Intravenous Given 12/03/17 0606)    Mobility walks

## 2017-12-03 NOTE — Progress Notes (Signed)
  Echocardiogram 2D Echocardiogram has been performed.  Fred [name] 12/03/2017, 10:42 AM

## 2017-12-03 NOTE — Progress Notes (Signed)
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 [name] will CC on this note  HTN-Home meds Hyzaar held-continue hydralazine  Bipolar continue Cymbalta 60  Memory deficit-continue Aricept  Hyperlipidemia continue Lipitor 40 daily  Hearing loss-history of     DVT prophylaxis: IV heparin at this time code Status: Full code presumed family Communication: Discussed with wife disposition Plan: Inpatient pending echocardiogram and hemodynamic stability over the next 48 hours expect will be stabilized-care manager to assess and help with starter pack and De Nurse, MD  [facility] Direct contact: [phone] --Via amion app OR  --[email or web address]; password [email or web address]  7PM-7AM contact night coverage as above 12/03/2017, 8:57 AM  LOS: 1 day   Consultants:  None  Procedures:  None  Antimicrobials:  None  Interval history/Subjective: Awake alert 4/10 chest pain center of her chest no radiation no cardiogenic type factors Eating drinking  Objective:  Vitals:  Vitals:   12/03/17 0800 12/03/17 0845  BP: 96/64 111/75  Pulse: (!) 54 (!) 52  Resp:  15  Temp:    SpO2: 99% 100%    Exam:  EOMI NCAT no distress no pallor no icterus Moderate dentition Well-built and nourished Chest clinically clear no added sound S1-S2 no murmur No  lower extremity edema no cord in legs Abdomen is soft nontender no rebound no guarding Skin is without rash   I have personally reviewed the following:   Labs:  LFTs are normal  Imaging studies:  Reviewed  Medical tests:  Reviewed  Test discussed with performing physician:  No  Decision to obtain old records:  No  Review and summation of old records:  yes  Scheduled Meds: . atorvastatin  40 mg Oral Daily  . donepezil  10 mg Oral Daily  . DULoxetine  60 mg Oral QHS  . lidocaine  1 patch Transdermal Q24H   Continuous Infusions: . heparin 1,300 Units/hr (12/02/17 1936)    Principal Problem:   Pulmonary embolism (HCC) Active Problems:   Prostate cancer ([place])   Chest pain   LOS: 1 day

## 2017-12-03 NOTE — Progress Notes (Signed)
LE venous duplex prelim: negative for DVT. Dam Ashraf Eunice, RDMS, RVT  

## 2017-12-03 NOTE — Progress Notes (Addendum)
Pharmacy - IV heparin  Assessment:    Please see note from Gretta Arab, PharmD earlier today for full details.  Briefly, 67 y.o. male on IV heparin for new PE. MD requesting to switch to Xarelto after 24 hr on IV heparin   Therapeutic on heparin at current rate  SCr acceptable, CrcCl > 30 ml/min  Plan:   Continue current IV heparin and monitoring as ordered  Start Xarelto 15 mg PO bidwc on 7/19 at 8pm  Stop heparin with first dose of [place] to provide Xarelto education prior to discharge   Reuel Boom, PharmD, BCPS [phone] 12/03/2017, 5:52 PM

## 2017-12-03 NOTE — Progress Notes (Signed)
[place] for Heparin Indication: pulmonary embolus  Allergies  Allergen Reactions  . Lyrica [Pregabalin] Other (See Comments)    Falls asleep while talking to people  . Percodan [Oxycodone-Aspirin] Hives and Nausea And Vomiting   Patient Measurements:   Heparin Dosing Weight: 78.9 kg  Vital Signs: BP: 107/75 (07/18 0700) Pulse Rate: 48 (07/18 0700)  Labs: Recent Labs    12/02/17 1517 12/02/17 2047 12/03/17 0232  HGB 13.4  --  12.6*  HCT 41.0  --  37.7*  PLT 224  --  222  HEPARINUNFRC  --   --  0.58  CREATININE 1.18  --   --   TROPONINI  --  <0.03 <0.03   Estimated Creatinine Clearance: 58 mL/min (by C-G formula based on SCr of 1.18 mg/dL).  Medications:  Infusions:  . sodium chloride 75 mL/hr at 12/03/17 0608  . heparin 1,300 Units/hr (12/02/17 1936)   Assessment: 39 yoM presents to ED on 7/17 with sudden onset pleuritic chest pain.  D-dimer elevated, CT/angio with multiple bilateral PE with small right sided pleural effusion. No recent surgery/travel/immobilization, leg swelling, hemoptysis, prior DVT/PE, or hormone use. He does have hx of prostate cancer which is in remission.  Pharmacy is consulted for heparin IV.  Today, 12/03/2017   HL 0.65, therapeutic  CBC:  H/H slightly low, Plts WNL  No bleeding or complications reported  SCr 1.18, Cl ~ 60 ml/min  Goal of Therapy:  Heparin level 0.3-0.7 units/ml Monitor platelets by anticoagulation protocol: Yes   Plan:   Continue Heparin drip at 1300 units/hr Daily heparin level and CBC  Follow up long-term anticoagulation plans  Gretta Arab PharmD, BCPS Pager [phone] 12/03/2017 7:51 AM

## 2017-12-03 NOTE — ED Notes (Signed)
Dr Hal Hope was called to come reevaluate the pt, the patient can't get comfortable and it seems as if his condition is getting worse.

## 2017-12-03 NOTE — Progress Notes (Signed)
[place] for Heparin Indication: pulmonary embolus  Allergies  Allergen Reactions  . Lyrica [Pregabalin] Other (See Comments)    Falls asleep while talking to people  . Percodan [Oxycodone-Aspirin] Hives and Nausea And Vomiting   Patient Measurements:   Heparin Dosing Weight: 78.9 kg  Vital Signs: BP: 108/74 (07/18 0307) Pulse Rate: 49 (07/18 0307)  Labs: Recent Labs    12/02/17 1517 12/02/17 2047 12/03/17 0232  HGB 13.4  --  12.6*  HCT 41.0  --  37.7*  PLT 224  --  222  HEPARINUNFRC  --   --  0.58  CREATININE 1.18  --   --   TROPONINI  --  <0.03 <0.03   Estimated Creatinine Clearance: 58 mL/min (by C-G formula based on SCr of 1.18 mg/dL).  Medical History: Past Medical History:  Diagnosis Date  . Cancer [facility])    prostate  . Depression   . Gait abnormality 07/22/2017  . Hearing loss   . History of kidney stones   . HOH (hard of hearing)   . Hyperlipidemia   . Hypertension   . Memory disorder 07/22/2017  . Neuromuscular disorder (HCC)    Medications:  Scheduled:  . atorvastatin  40 mg Oral Daily  . donepezil  10 mg Oral Daily  . DULoxetine  60 mg Oral QHS  . lidocaine  1 patch Transdermal Q24H   Infusions:  . sodium chloride 75 mL/hr at 12/02/17 2056  . heparin 1,300 Units/hr (12/02/17 1936)   Assessment: 89 yoM with sudden onset pleuritic chest pain, D-dimer elevated, CT/angio with multiple bilateral PE with small right sided pleural effusion. No recent surgery/travel/immobilization, leg swelling, hemoptysis, prior DVT/PE, or hormone use. He does have hx of prostate cancer which is in remission.   Today, 12/03/2017   HL 0.58, therapeutic  H/H slightly low  Plts WNL  SCr 1.18, Cl ~ 60 ml/min  Goal of Therapy:  Heparin level 0.3-0.7 units/ml Monitor platelets by anticoagulation protocol: Yes   Plan:   Continue Heparin drip at 1300 units/hr  HL in 6 hours  Daily CBC while on Heparin  Dolly Rias  RPh 12/03/2017, 3:58 AM Pager [phone]

## 2017-12-03 NOTE — ED Notes (Addendum)
Pt stating pain medication is not helping anymore, Dr Maudie Mercury made aware.

## 2017-12-04 LAB — CBC
HEMATOCRIT: 37.4 % — AB (ref 39.0–52.0)
HEMOGLOBIN: 12.7 g/dL — AB (ref 13.0–17.0)
MCH: 29.7 pg (ref 26.0–34.0)
MCHC: 34 g/dL (ref 30.0–36.0)
MCV: 87.6 fL (ref 78.0–100.0)
Platelets: 195 10*3/uL (ref 150–400)
RBC: 4.27 MIL/uL (ref 4.22–5.81)
RDW: 13 % (ref 11.5–15.5)
WBC: 6.1 10*3/uL (ref 4.0–10.5)

## 2017-12-04 LAB — HEPARIN LEVEL (UNFRACTIONATED)
HEPARIN UNFRACTIONATED: 0.66 [IU]/mL (ref 0.30–0.70)
Heparin Unfractionated: 0.94 IU/mL — ABNORMAL HIGH (ref 0.30–0.70)

## 2017-12-04 MED ORDER — HEPARIN (PORCINE) IN NACL 100-0.45 UNIT/ML-% IJ SOLN
1100.0000 [IU]/h | INTRAMUSCULAR | Status: AC
  Administered 2017-12-04: 1100 [IU]/h via INTRAVENOUS
  Filled 2017-12-04: qty 250

## 2017-12-04 NOTE — Progress Notes (Signed)
[place] for Heparin Indication: pulmonary embolus  Allergies  Allergen Reactions  . Lyrica [Pregabalin] Other (See Comments)    Falls asleep while talking to people  . Percodan [Oxycodone-Aspirin] Hives and Nausea And Vomiting   Patient Measurements: Height: 5\' 8"  (172.7 cm) Weight: 176 lb 12.9 oz (80.2 kg) IBW/kg (Calculated) : 68.4 Heparin Dosing Weight: 78.9 kg  Vital Signs: Temp: 97.9 F (36.6 C) (07/19 1500) Temp Source: Oral (07/19 1500) BP: 89/48 (07/19 1000) Pulse Rate: 51 (07/19 1000)  Labs: Recent Labs    12/02/17 1517 12/02/17 2047  12/03/17 0232 12/03/17 0819 12/04/17 0348 12/04/17 1508  HGB 13.4  --   --  12.6*  --  12.7*  --   HCT 41.0  --   --  37.7*  --  37.4*  --   PLT 224  --   --  222  --  195  --   HEPARINUNFRC  --   --    < > 0.58 0.65 0.94* 0.66  CREATININE 1.18  --   --   --  1.20  --   --   TROPONINI  --  <0.03  --  <0.03 <0.03  --   --    < > = values in this interval not displayed.   Estimated Creatinine Clearance: 57 mL/min (by C-G formula based on SCr of 1.2 mg/dL).  Medications:  Infusions:  . heparin 1,100 Units/hr (12/04/17 3244)   Assessment: 66 yoM presents to ED on 7/17 with sudden onset pleuritic chest pain.  D-dimer elevated, CT/angio with multiple bilateral PE with small right sided pleural effusion. No recent surgery/travel/immobilization, leg swelling, hemoptysis, prior DVT/PE, or hormone use. He does have hx of prostate cancer which is in remission.  Pharmacy is consulted for heparin IV.  Today, 12/04/2017   HL therapeutic at 0.66 after rate decreased to 1100 units/hr  CBC:  H/H slightly low but stable from admission, Plts WNL  No bleeding or complications reported  SCr 1.2, Cl ~ 60 ml/min  No drug-drug interactions noted, but concurrent toradol use can increase bleeding risk  Goal of Therapy:  Heparin level 0.3-0.7 units/ml Monitor platelets by anticoagulation protocol:  Yes   Plan:   Stop heparin drip at 8pm, then begin Xarelto 15 mg PO bidwc on 7/19 at 8pm x 21 days, then 20mg  daily Xarelto education & coupon voucher completed 7/19  Eudelia Bunch, Pharm.D 12/04/2017 5:14 PM

## 2017-12-04 NOTE — Progress Notes (Signed)
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 [name]   Chr pain from multiple back surgeries-re-implemented Chr pain meds-breakthrough on board and in no current pasin  HTN-Home meds Hyzaar held-continue hydralazine  Mild Pulm HTn on Echo-2/2 PE-OP monitoring  Bipolar continue Cymbalta 60  Memory deficit-continue Aricept  Hyperlipidemia continue Lipitor 40 daily  Hearing loss-history of     DVT prophylaxis: IV heparin at this time code Status: Full code presumed family Communication: Discussed with wife disposition Plan: Inpatient pending 24 hours IV heparin GTT and eventual transition to NOAC and OP follow up  Verlon Au, MD  [facility] Direct contact: [phone] --Via amion app OR  --[email or web address]; password [email or web address]  7PM-7AM contact night coverage as above 12/04/2017, 7:49 AM  LOS: 2 days   Consultants:  None  Procedures:  None  Antimicrobials:  None  Interval history/Subjective:   Objective:  Vitals:  Vitals:   12/04/17 0500 12/04/17 0600  BP: 122/89 126/75  Pulse: (!) 55 (!) 59  Resp: 13 12  Temp:    SpO2: 97% 96%    Exam:  EOMI NCAT no distress no pallor no icterus partial denture in the upper Well-built and nourished Chest clinically clear no added sound S1-S2 no murmur No lower extremity edema no cord in legs Abdomen is soft nontender no rebound no  guarding Skin is without rash   I have personally reviewed the following:   Labs:  PLT 222--->195  Imaging studies: ECHO-Impressions:  - Normal LV EF. Elevated right sided filling pressures. Mildly   dilated RV with normal systolic function.  Duplex LE's is neg for DVT  Medical tests:  Reviewed  Test discussed with performing physician:  No  Decision to obtain old records:  No  Review and summation of old records:  yes  Scheduled Meds: . atorvastatin  40 mg Oral Daily  . donepezil  10 mg Oral Daily  . DULoxetine  60 mg Oral QHS  . lidocaine  1 patch Transdermal Q24H  . Rivaroxaban  15 mg Oral BID WC  . [START ON 12/25/2017] rivaroxaban  20 mg Oral Q supper   Continuous Infusions: . heparin 1,100 Units/hr (12/04/17 6606)    Principal Problem:   Pulmonary embolism (HCC) Active Problems:   Prostate cancer ([place])   Chest pain   LOS: 2 days

## 2017-12-04 NOTE — Progress Notes (Signed)
[place] for Heparin Indication: pulmonary embolus  Allergies  Allergen Reactions  . Lyrica [Pregabalin] Other (See Comments)    Falls asleep while talking to people  . Percodan [Oxycodone-Aspirin] Hives and Nausea And Vomiting   Patient Measurements: Height: 5\' 8"  (172.7 cm) Weight: 175 lb 0.7 oz (79.4 kg) IBW/kg (Calculated) : 68.4 Heparin Dosing Weight: 78.9 kg  Vital Signs: Temp: 97.6 F (36.4 C) (07/19 0354) Temp Source: Oral (07/19 0354) BP: 126/75 (07/19 0600) Pulse Rate: 59 (07/19 0600)  Labs: Recent Labs    12/02/17 1517 12/02/17 2047 12/03/17 0232 12/03/17 0819 12/04/17 0348  HGB 13.4  --  12.6*  --  12.7*  HCT 41.0  --  37.7*  --  37.4*  PLT 224  --  222  --  195  HEPARINUNFRC  --   --  0.58 0.65 0.94*  CREATININE 1.18  --   --  1.20  --   TROPONINI  --  <0.03 <0.03 <0.03  --    Estimated Creatinine Clearance: 57 mL/min (by C-G formula based on SCr of 1.2 mg/dL).  Medications:  Infusions:  . heparin 1,300 Units/hr (12/04/17 0600)   Assessment: 75 yoM presents to ED on 7/17 with sudden onset pleuritic chest pain.  D-dimer elevated, CT/angio with multiple bilateral PE with small right sided pleural effusion. No recent surgery/travel/immobilization, leg swelling, hemoptysis, prior DVT/PE, or hormone use. He does have hx of prostate cancer which is in remission.  Pharmacy is consulted for heparin IV.  Today, 12/04/2017   HL 0.94, SUPRAtherapeutic on heparin 1300 units/hr  CBC:  H/H slightly low but stable from admission, Plts WNL  No bleeding or complications reported  SCr 1.2, Cl ~ 60 ml/min  No drug-drug interactions noted, but concurrent toradol use can increase bleeding risk  Goal of Therapy:  Heparin level 0.3-0.7 units/ml Monitor platelets by anticoagulation protocol: Yes   Plan:   Decrease heparin drip to 1100 units/hr Recheck heparin level in 8hrs  Stop heparin drip at 8pm, then begin Xarelto 15  mg PO bidwc on 7/19 at 8pm x 21 days, then 20mg  daily  Pharmacy to provide Xarelto education & coupon voucher prior to discharge  Peggyann Juba, PharmD, BCPS Pager: [phone] 12/04/2017 6:41 AM

## 2017-12-05 LAB — CBC WITH DIFFERENTIAL/PLATELET
Basophils Absolute: 0 10*3/uL (ref 0.0–0.1)
Basophils Relative: 0 %
Eosinophils Absolute: 0.2 10*3/uL (ref 0.0–0.7)
Eosinophils Relative: 4 %
HCT: 36 % — ABNORMAL LOW (ref 39.0–52.0)
HEMOGLOBIN: 11.9 g/dL — AB (ref 13.0–17.0)
LYMPHS ABS: 1.7 10*3/uL (ref 0.7–4.0)
Lymphocytes Relative: 31 %
MCH: 29 pg (ref 26.0–34.0)
MCHC: 33.1 g/dL (ref 30.0–36.0)
MCV: 87.8 fL (ref 78.0–100.0)
Monocytes Absolute: 0.5 10*3/uL (ref 0.1–1.0)
Monocytes Relative: 10 %
NEUTROS PCT: 55 %
Neutro Abs: 2.9 10*3/uL (ref 1.7–7.7)
Platelets: 211 10*3/uL (ref 150–400)
RBC: 4.1 MIL/uL — ABNORMAL LOW (ref 4.22–5.81)
RDW: 13 % (ref 11.5–15.5)
WBC: 5.4 10*3/uL (ref 4.0–10.5)

## 2017-12-05 MED ORDER — RIVAROXABAN (XARELTO) VTE STARTER PACK (15 & 20 MG): ORAL_TABLET | ORAL | 0 refills | Status: DC

## 2017-12-05 MED ORDER — LISINOPRIL 10 MG PO TABS: 10.0000 mg | ORAL_TABLET | Freq: Every day | ORAL | 0 refills | Status: DC

## 2017-12-05 NOTE — Progress Notes (Signed)
Discharge instructions (including medications) discussed with and copy provided to patient/caregiver 

## 2017-12-05 NOTE — Progress Notes (Signed)
Patient  was noted bleeding from IV site due to malfunction. Was able to stop bleeding  with a pressure. Patient remain asymptomatic. BP stable.

## 2017-12-05 NOTE — Discharge Summary (Signed)
Physician Discharge Summary  Fred [name] IWP:[id number] DOB: Aug 20, 1950 DOA: 12/02/2017  PCP: Maury Dus, MD  Admit date: 12/02/2017 Discharge date: 12/05/2017  Time spent: 40 minutes  Recommendations for Outpatient Follow-up:  1. Patient will need Xarelto lifelong-recommend outpatient discussion with an oncologist if felt otherwise 2. Please follow-up for his chronic pain with his pain physician 3. Will need screening PSA as an outpatient 4. Recommend lisinopril 10 >HCTZ combination with lisinopril given mild rise in creatinine-can be discussed as an outpatient 5. Will need probable screening echo in 1 to 2 years given mild pulmonary hypertension probably secondary to PE 6. Please consider CBC and complete metabolic panel in about 1 week  Discharge Diagnoses:  Principal Problem:   Pulmonary embolism (HCC) Active Problems:   Prostate cancer [facility])   Chest pain   Discharge Condition: Improved  Diet recommendation: Heart healthy  Filed Weights   12/03/17 1530 12/04/17 0500 12/05/17 0512  Weight: 79.4 kg (175 lb 0.7 oz) 80.2 kg (176 lb 12.9 oz) 79.9 kg (176 lb 3.2 oz)    History of present illness:  86 m localized pr cancer Gleason 7 s/p Radical prostatectomy, nephrlithiasis s/p surgery 19080's chr pain-post-laminectomy +cervicalgia-L2 compression, htn, bipolar, presbycusis with implanted hearing aid  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 Kathleene Hazel carbon  copied both him and Dr. Mariea Clonts on this note  Chr pain from multiple back surgeries-re-implemented Chr pain meds-breakthrough on board and in no current pasin  HTN-Home meds Hyzaar held-continue hydralazine  Mild Pulm HTn on Echo-2/2 PE-OP monitoring  Bipolar continue Cymbalta 60  Memory deficit-continue Aricept  Hyperlipidemia continue Lipitor 40 daily  Hearing loss-history of    Procedures:  CT angiogram  Echocardiogram   Consultations:  None  Discharge Exam: Vitals:   12/05/17 0054 12/05/17 0509  BP: 130/81 125/67  Pulse: (!) 53 (!) 57  Resp:  12  Temp:  97.8 F (36.6 C)  SpO2:  99%    General: Awake alert oriented no distress Cardiovascular: N0-N3 holosystolic murmur left lower sternal edge no JVD no bruit Respiratory: Clinically clear no added sounds Abdomen soft no rebound no guarding Neurologically intact  Discharge Instructions   Discharge Instructions    Diet - low sodium heart healthy   Complete by:  As directed    Discharge instructions   Complete by:  As directed    You will notice that your blood pressure medications have changed slightly-please follow-up with Dr. Mariea Clonts for repeat labs in about a week I would recommend that you get a prescription for the blood thinner and you will probably need to continue this lifelong or as directed by Dr. 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   donepezil 10 MG tablet Commonly known as:  ARICEPT Take 10 mg by mouth daily.   DULoxetine 60 MG capsule Commonly known as:  CYMBALTA Take 60 mg by mouth at bedtime.   lisinopril 10 MG tablet Commonly known as:  PRINIVIL,ZESTRIL Take 1 tablet (10 mg total) by mouth daily.   oxyCODONE 5 MG immediate release tablet Commonly known as:  Oxy IR/ROXICODONE Take 1 tablet (5 mg total) by mouth every 8 (eight) hours as needed for severe pain.   Rivaroxaban 15 & 20 MG Tbpk Take as directed on package: Start with one 15mg  tablet by mouth twice a day with food. On Day 22, switch to one 20mg  tablet once a day with food.      Allergies  Allergen Reactions  . Lyrica [Pregabalin] Other (See Comments)    Falls asleep while talking to people  . Percodan [Oxycodone-Aspirin] Hives and Nausea And Vomiting   Follow-up Information    Maury Dus, MD.   Specialty:  Family Medicine Contact information: [place] [phone]            The results of significant diagnostics from this hospitalization (including imaging, microbiology, ancillary and laboratory) are listed below for reference.    Significant Diagnostic Studies: Dg Chest 2 View  Result Date: 12/02/2017 CLINICAL DATA:  Right-sided chest pain beginning this morning. EXAM: CHEST - 2 VIEW COMPARISON:  Two-view chest x-ray 04/18/2014. FINDINGS: Heart size is normal. Bibasilar airspace disease is present. Effusions are not excluded. Lung volumes are low. The visualized soft tissues and bony thorax are unremarkable. IMPRESSION: 1. Bibasilar airspace disease with concern for bilateral lower lobe pneumonia, left greater than right. 2. Low lung volumes. Electronically Signed   By: San Morelle M.D.   On: 12/02/2017 14:42   Ct Angio Chest Pe W/cm &/or Wo Cm  Result Date: 12/02/2017 CLINICAL DATA:  67 year old male with a history of right-sided chest pain starting 8 hours ago. EXAM: CT ANGIOGRAPHY CHEST WITH CONTRAST TECHNIQUE: Multidetector CT imaging of the chest was performed using the standard protocol during bolus administration of intravenous contrast. Multiplanar CT image reconstructions and MIPs were obtained to evaluate the vascular anatomy. CONTRAST:  188mL ISOVUE-370 IOPAMIDOL (ISOVUE-370) INJECTION 76% COMPARISON:  None. FINDINGS: Cardiovascular: Heart: No cardiomegaly. No pericardial fluid/thickening. Calcifications of the left anterior descending, circumflex coronary arteries. Ratio of the right ventricle to left ventricle remains below 1. No septal inversion. Aorta: Unremarkable course, caliber, contour of the thoracic aorta. No aneurysm or dissection flap. No periaortic fluid. Pulmonary arteries: No filling defects of the main pulmonary artery, lobar arteries, or segmental arteries. There are a few emboli identified within the subsegmental pulmonary arteries of the right lower lobe, left upper lobe, and left lower lobe. Mediastinum/Nodes: No mediastinal adenopathy. Unremarkable appearance of the thoracic inlet. Unremarkable course of the thoracic esophagus. Lungs/Pleura: Respiratory motion somewhat limits evaluation of the lung bases. Interlobular septal thickening with nodular opacities at  the bilateral lung bases with mixed ground-glass opacities. Trace right-sided pleural effusion. Calcified granuloma on the right within the major fissure (image 75 of series 6). Subpleural lymph node just distal to the granuloma. Upper Abdomen: No acute finding of the upper abdomen. Nonobstructive left-sided nephrolithiasis Musculoskeletal: No acute displaced fracture. Degenerative changes of the spine. Review of the MIP images confirms the above findings. IMPRESSION: CT is positive for  pulmonary emboli, with low volume emboli involving subsegmental branches of the left upper lobe and bilateral lower lobes. Mixed ground-glass and nodular opacities the bilateral lobes with small right-sided pleural effusion may be reactive given the pulmonary emboli, or alternatively represent developing infection. These results were called by telephone at the time of interpretation on 12/02/2017 at 6:21 pm to Ms. KELLY GEKAS , who verbally acknowledged these results. Two vessel coronary artery disease. Electronically Signed   By: Corrie Mckusick D.O.   On: 12/02/2017 18:24   Dg Chest Port 1 View  Result Date: 12/03/2017 CLINICAL DATA:  67 y/o  M; chest pain. EXAM: PORTABLE CHEST 1 VIEW COMPARISON:  12/02/2017 CT chest and chest radiograph. FINDINGS: Stable normal cardiac silhouette. Anterior cervical fusion hardware noted. Streaky opacities at the lung bases. No pneumothorax. Bowel super position over the liver. No acute osseous abnormality is evident. IMPRESSION: Streaky opacities at the lung bases may represent atelectasis or pneumonia. Electronically Signed   By: Kristine Garbe M.D.   On: 12/03/2017 04:22    Microbiology: Recent Results (from the past 240 hour(s))  MRSA PCR Screening     Status: None   Collection Time: 12/03/17  3:38 PM  Result Value Ref Range Status   MRSA by PCR NEGATIVE NEGATIVE Final    Comment:        The GeneXpert MRSA Assay (FDA approved for NASAL specimens only), is one component of a comprehensive MRSA colonization surveillance program. It is not intended to diagnose MRSA infection nor to guide or monitor treatment for MRSA infections. Performed at [facility], [place] [address], [place], [place] [address]      Labs: Basic Metabolic Panel: Recent Labs  Lab 12/02/17 1517 12/03/17 0819  NA 140 139  K 3.9 4.0  CL 103 105  CO2 30 27  GLUCOSE 91 87  BUN 16 18  CREATININE 1.18 1.20  CALCIUM 9.1 8.8*   Liver Function  Tests: Recent Labs  Lab 12/03/17 0232 12/03/17 0819  AST 23 19  ALT 18 20  ALKPHOS 65 64  BILITOT 1.0 0.8  PROT 6.7 6.4*  ALBUMIN 3.8 3.6   No results for input(s): LIPASE, AMYLASE in the last 168 hours. No results for input(s): AMMONIA in the last 168 hours. CBC: Recent Labs  Lab 12/02/17 1517 12/03/17 0232 12/04/17 0348 12/05/17 0418  WBC 10.2 7.2 6.1 5.4  NEUTROABS  --   --   --  2.9  HGB 13.4 12.6* 12.7* 11.9*  HCT 41.0 37.7* 37.4* 36.0*  MCV 88.2 88.1 87.6 87.8  PLT 224 222 195 211   Cardiac Enzymes: Recent Labs  Lab 12/02/17 2047 12/03/17 0232 12/03/17 0819  TROPONINI <0.03 <0.03 <0.03   BNP: BNP (last 3 results) Recent Labs    12/03/17 0232  BNP 33.9    ProBNP (last 3 results) No results for input(s): PROBNP in the last 8760 hours.  CBG: No results for input(s): GLUCAP in the last 168 hours.     Signed:  Nita Sells MD   [facility] 12/05/2017, 9:19 AM

## 2018-01-05 ENCOUNTER — Encounter: Payer: Medicare Other | Attending: Registered Nurse | Admitting: Registered Nurse

## 2018-01-05 ENCOUNTER — Encounter: Payer: Self-pay | Admitting: Registered Nurse

## 2018-01-05 VITALS — BP 133/90 | HR 60 | Ht 68.0 in | Wt 174.6 lb

## 2018-01-05 DIAGNOSIS — I10 Essential (primary) hypertension: Secondary | ICD-10-CM | POA: Diagnosis not present

## 2018-01-05 DIAGNOSIS — G8929 Other chronic pain: Secondary | ICD-10-CM

## 2018-01-05 DIAGNOSIS — M542 Cervicalgia: Secondary | ICD-10-CM | POA: Diagnosis not present

## 2018-01-05 DIAGNOSIS — M25511 Pain in right shoulder: Secondary | ICD-10-CM | POA: Insufficient documentation

## 2018-01-05 DIAGNOSIS — E785 Hyperlipidemia, unspecified: Secondary | ICD-10-CM | POA: Insufficient documentation

## 2018-01-05 DIAGNOSIS — Z79899 Other long term (current) drug therapy: Secondary | ICD-10-CM

## 2018-01-05 DIAGNOSIS — Z981 Arthrodesis status: Secondary | ICD-10-CM | POA: Diagnosis not present

## 2018-01-05 DIAGNOSIS — Z823 Family history of stroke: Secondary | ICD-10-CM | POA: Diagnosis not present

## 2018-01-05 DIAGNOSIS — M961 Postlaminectomy syndrome, not elsewhere classified: Secondary | ICD-10-CM

## 2018-01-05 DIAGNOSIS — M545 Low back pain, unspecified: Secondary | ICD-10-CM

## 2018-01-05 DIAGNOSIS — Z841 Family history of disorders of kidney and ureter: Secondary | ICD-10-CM | POA: Insufficient documentation

## 2018-01-05 DIAGNOSIS — M25512 Pain in left shoulder: Secondary | ICD-10-CM | POA: Diagnosis not present

## 2018-01-05 DIAGNOSIS — Z87891 Personal history of nicotine dependence: Secondary | ICD-10-CM | POA: Insufficient documentation

## 2018-01-05 DIAGNOSIS — H9190 Unspecified hearing loss, unspecified ear: Secondary | ICD-10-CM | POA: Diagnosis not present

## 2018-01-05 DIAGNOSIS — G894 Chronic pain syndrome: Secondary | ICD-10-CM

## 2018-01-05 DIAGNOSIS — Z8249 Family history of ischemic heart disease and other diseases of the circulatory system: Secondary | ICD-10-CM | POA: Insufficient documentation

## 2018-01-05 DIAGNOSIS — M2550 Pain in unspecified joint: Secondary | ICD-10-CM

## 2018-01-05 DIAGNOSIS — M5412 Radiculopathy, cervical region: Secondary | ICD-10-CM

## 2018-01-05 DIAGNOSIS — Z5181 Encounter for therapeutic drug level monitoring: Secondary | ICD-10-CM

## 2018-01-05 DIAGNOSIS — Z87442 Personal history of urinary calculi: Secondary | ICD-10-CM | POA: Insufficient documentation

## 2018-01-05 MED ORDER — OXYCODONE HCL 5 MG PO TABS: 5.0000 mg | ORAL_TABLET | Freq: Three times a day (TID) | ORAL | 0 refills | Status: DC | PRN

## 2018-01-05 NOTE — Progress Notes (Signed)
Subjective:    Patient ID: Fred [name], male    DOB: 01-14-1951, 67 y.o.   MRN: [id number]  HPI: [name] is a 67 year old male who returns for follow up appointment for chronic pain and medication refill. He states his pain is located in his neck radiating into his bilateral shoulders and lower back pain. Also reports joint pain all over. He rates his pain 5. His current exercise regime is walking.   Mr. Gum was hospitalized at [facility] for Pulmonary Embolism on 12/02/2017 and discharged on 11/2017.   [name] Morphine equivalent is 18.75 MME. Last Oral Swab was Performed on 10/01/2017, it was consistent.   Pain Inventory Average Pain 5 Pain Right Now 5 My pain is sharp, stabbing and aching  In the last 24 hours, has pain interfered with the following? General activity 4 Relation with others 5 Enjoyment of life 0 What TIME of day is your pain at its worst? daytime Sleep (in general) na  Pain is worse with: bending, sitting, standing and some activites Pain improves with: medication and TENS Relief from Meds: na  Mobility walk without assistance ability to climb steps?  yes  Function disabled: date disabled .  Neuro/Psych No problems in this area  Prior Studies Any changes since last visit?  no  Physicians involved in your care Any changes since last visit?  no   Family History  Problem Relation Age of Onset  . Stroke Mother   . Dementia Mother   . Heart disease Father   . Stroke Father        heat stroke  . Kidney disease Brother   . Dementia Brother    Social History   Socioeconomic History  . Marital status: Married    Spouse name: Not on file  . Number of children: 6  . Years of education: 12+  . Highest education level: Not on file  Occupational History  . Occupation: Retired  Scientific laboratory technician  . Financial resource strain: Not on file  . Food insecurity:    Worry: Not on file    Inability: Not on file  .  Transportation needs:    Medical: Not on file    Non-medical: Not on file  Tobacco Use  . Smoking status: Former Smoker    Packs/day: 1.00    Years: 30.00    Pack years: 30.00    Types: Cigarettes    Last attempt to quit: 05/25/2001    Years since quitting: 16.6  . Smokeless tobacco: Former Systems developer    Quit date: 09/22/2001  Substance and Sexual Activity  . Alcohol use: No  . Drug use: No  . Sexual activity: Not on file  Lifestyle  . Physical activity:    Days per week: Not on file    Minutes per session: Not on file  . Stress: Not on file  Relationships  . Social connections:    Talks on phone: Not on file    Gets together: Not on file    Attends religious service: Not on file    Active member of club or organization: Not on file    Attends meetings of clubs or organizations: Not on file    Relationship status: Not on file  Other Topics Concern  . Not on file  Social History Narrative   Lives with wife   Caffeine use: Coffee daily (1-2 per day)   Some soda   Right handed    Past  Surgical History:  Procedure Laterality Date  . BRAIN SURGERY     inserted rod to connect to BaHa(Hearing device)  . EYE SURGERY     lasik  . LYMPHADENECTOMY Bilateral 08/04/2013   Procedure: LYMPHADENECTOMY;  Surgeon: Fred Gray, MD;  Location: [facility];  Service: Urology;  Laterality: Bilateral;  . PROSTATE SURGERY    . ROBOT ASSISTED LAPAROSCOPIC RADICAL PROSTATECTOMY N/A 08/04/2013   Procedure: ROBOTIC ASSISTED LAPAROSCOPIC RADICAL PROSTATECTOMY LEVEL 2;  Surgeon: Fred Gray, MD;  Location: [facility];  Service: Urology;  Laterality: N/A;  . SPINE SURGERY     cervical disc removed and stabalized with bone- good mobility   Past Medical History:  Diagnosis Date  . Cancer [facility])    prostate  . Depression   . Gait abnormality 07/22/2017  . Hearing loss   . History of kidney stones   . HOH (hard of hearing)   . Hyperlipidemia   . Hypertension   . Memory disorder 07/22/2017  . Neuromuscular disorder  ([place])    There were no vitals taken for this visit.  Opioid Risk Score:   Fall Risk Score:  `1  Depression screen PHQ 2/9  Depression screen [facility] 2/9 11/03/2017 02/19/2017 12/12/2014 08/28/2014  Decreased Interest 0 0 0 2  Down, Depressed, Hopeless 0 0 0 1  PHQ - 2 Score 0 0 0 3  Altered sleeping - - - 1  Tired, decreased energy - - - 2  Change in appetite - - - 0  Feeling bad or failure about yourself  - - - 0  Trouble concentrating - - - 1  Moving slowly or fidgety/restless - - - 0  Suicidal thoughts - - - 0  PHQ-9 Score - - - 7     Review of Systems  Constitutional: Negative.   HENT: Negative.   Eyes: Negative.   Respiratory: Negative.   Cardiovascular: Negative.   Gastrointestinal: Negative.   Endocrine: Negative.   Genitourinary: Negative.   Musculoskeletal: Positive for arthralgias, back pain, myalgias and neck pain.  Skin: Negative.   Allergic/Immunologic: Negative.   Neurological: Negative.   Hematological: Negative.   Psychiatric/Behavioral: Negative.   All other systems reviewed and are negative.      Objective:   Physical Exam  Constitutional: He is oriented to person, place, and time. He appears well-developed and well-nourished.  HENT:  Head: Normocephalic and atraumatic.  Neck: Normal range of motion. Neck supple.  Cardiovascular: Normal rate and regular rhythm.  Pulmonary/Chest: Effort normal and breath sounds normal.  Musculoskeletal:  Normal Muscle Bulk and Muscle Testing Reveals: Upper Extremities: Full ROM and Muscle Strength 4/5 Bilateral AC Joint Tenderness Lower Extremities: Full ROM and Muscle Strength 5/5 Arises from Table with ease Narrow Based gait  Neurological: He is alert and oriented to person, place, and time.  Skin: Skin is warm and dry.  Psychiatric: He has a normal mood and affect. His behavior is normal.  Nursing note and vitals reviewed.         Assessment & Plan:  1.Cervical Postlaminectomy: Continue HEP and Continue  current medication regime . 01/05/2018 2. Cervicalgia/ Cervical Radiculitis: Continue current medication regime. Continue to Monitor. 01/05/2018 3. Chronic BilateralShoulder Pain: Continue HEP as Tolerated. 01/05/2018 4. L2 compression fracture/ Chronic Bilateral Low Back Pain without Sciatica: Continue to Monitor: 01/05/2018 Refilled:Oxycodone 5 mg one tablet every 8hours as needed for moderate pain. # 90. We will Continue the opioid monitoring program. This consists of regular clinic visits, examinations, urine drug screen, pill  counts as well as use of [place] controlled substance reporting System. 5. Polyarthralgia: Continue to Monitor. 01/05/2018 6. Muscle Spasm:Continuecurrent medication regimen with: Flexeril.01/05/2018  20 minutes of face to face patient care time was spent during this visit. All questions were encouraged and answered.  F/U in 71month

## 2018-01-25 ENCOUNTER — Ambulatory Visit: Payer: Medicare Other | Admitting: Physical Medicine & Rehabilitation

## 2018-02-02 ENCOUNTER — Ambulatory Visit: Payer: Medicare Other | Admitting: Neurology

## 2018-02-02 ENCOUNTER — Encounter: Payer: Self-pay | Admitting: Neurology

## 2018-02-02 VITALS — BP 130/85 | HR 64 | Wt 180.5 lb

## 2018-02-02 DIAGNOSIS — R269 Unspecified abnormalities of gait and mobility: Secondary | ICD-10-CM

## 2018-02-02 DIAGNOSIS — R413 Other amnesia: Secondary | ICD-10-CM | POA: Diagnosis not present

## 2018-02-02 NOTE — Progress Notes (Signed)
Reason for visit: Gait disorder, memory disorder  Fred [name] is an 67 y.o. male  History of present illness:  Fred [name] is a 67 year old right-handed white male with a history of a closed head injury and a family history of Alzheimer's disease.  The patient has had some issues with memory over time, he currently is on Aricept and is tolerating the medication well.  He does not believe there has been any change in his memory issues since last seen.  The patient has had cervical spine surgery in the past, he has had some troubles with balance since the surgery, he has had some mild alteration in balance more recently.  MRI of the cervical spine was repeated, no evidence of spinal cord compression was noted.  The patient also reports some low back pain without radiation down the legs.  The patient has not had any falls, he denies issues controlling the bowels or the bladder.  The patient was admitted to the hospital on 02 December 2017 with a pulmonary embolism, he is now on Xarelto.  The patient has complained of bilateral shoulder discomfort that is worse in the morning, better as the day goes on, he has some restriction of movement across the shoulders bilaterally.  Past Medical History:  Diagnosis Date  . Cancer [facility])    prostate  . Depression   . Gait abnormality 07/22/2017  . Hearing loss   . History of kidney stones   . HOH (hard of hearing)   . Hyperlipidemia   . Hypertension   . Memory disorder 07/22/2017  . Neuromuscular disorder [facility])     Past Surgical History:  Procedure Laterality Date  . BRAIN SURGERY     inserted rod to connect to BaHa(Hearing device)  . EYE SURGERY     lasik  . LYMPHADENECTOMY Bilateral 08/04/2013   Procedure: LYMPHADENECTOMY;  Surgeon: Dutch Gray, MD;  Location: [facility];  Service: Urology;  Laterality: Bilateral;  . PROSTATE SURGERY    . ROBOT ASSISTED LAPAROSCOPIC RADICAL PROSTATECTOMY N/A 08/04/2013   Procedure: ROBOTIC ASSISTED LAPAROSCOPIC  RADICAL PROSTATECTOMY LEVEL 2;  Surgeon: Dutch Gray, MD;  Location: [facility];  Service: Urology;  Laterality: N/A;  . SPINE SURGERY     cervical disc removed and stabalized with bone- good mobility    Family History  Problem Relation Age of Onset  . Stroke Mother   . Dementia Mother   . Heart disease Father   . Stroke Father        heat stroke  . Kidney disease Brother   . Dementia Brother     Social history:  reports that he quit smoking about 16 years ago. His smoking use included cigarettes. He has a 30.00 pack-year smoking history. He quit smokeless tobacco use about 16 years ago. He reports that he does not drink alcohol or use drugs.    Allergies  Allergen Reactions  . Lyrica [Pregabalin] Other (See Comments)    Falls asleep while talking to people  . Percodan [Oxycodone-Aspirin] Hives and Nausea And Vomiting    Medications:  Prior to Admission medications   Medication Sig Start Date End Date Taking? Authorizing Provider  atorvastatin (LIPITOR) 40 MG tablet Take 40 mg by mouth daily. 11/10/17  Yes [provider]  donepezil (ARICEPT) 10 MG tablet Take 10 mg by mouth daily. 11/10/17  Yes [provider]  DULoxetine (CYMBALTA) 60 MG capsule Take 60 mg by mouth at bedtime.    Yes [provider]  oxyCODONE (OXY IR/ROXICODONE) 5 MG immediate release tablet Take 1 tablet (5 mg total) by mouth every 8 (eight) hours as needed for severe pain. 01/05/18  Yes Bayard Hugger, NP  rivaroxaban (XARELTO) 20 MG TABS tablet Take 20 mg by mouth daily with supper.   Yes [provider]  lisinopril (PRINIVIL,ZESTRIL) 10 MG tablet Take 1 tablet (10 mg total) by mouth daily. 12/05/17 01/04/18  Nita Sells, MD    ROS:  Out of a complete 14 system review of symptoms, the patient complains only of the following symptoms, and all other reviewed systems are negative.  Memory loss Joint pain, back pain, aching muscles, muscle cramps  Blood pressure  130/85, pulse 64, weight 180 lb 8 oz (81.9 kg).  Physical Exam  General: The patient is alert and cooperative at the time of the examination.  Neuromuscular: The patient has some restriction of elevation of the shoulders bilaterally, lacking about 15 degrees of abduction.  Restriction of internal and external rotation of the shoulders is also noted.  The patient has discomfort with these maneuvers.  Skin: No significant peripheral edema is noted.   Neurologic Exam  Mental status: The patient is alert and oriented x 3 at the time of the examination. The Mini-Mental status examination done today shows a total score 24/30.   Cranial nerves: Facial symmetry is present. Speech is normal, no aphasia or dysarthria is noted. Extraocular movements are full. Visual fields are full.  Motor: The patient has good strength in all 4 extremities.  Sensory examination: Soft touch sensation is symmetric on the face, arms, and legs.  Coordination: The patient has good finger-nose-finger and heel-to-shin bilaterally.  Gait and station: The patient has a normal gait. Tandem gait is slightly unsteady. Romberg is negative. No drift is seen.  Reflexes: Deep tendon reflexes are symmetric.   MRI cervical 08/03/17:    IMPRESSION: This MRI of the cervical spine without contrast shows the following: 1. There is no spinal cord compression the spinal cord has normal signal. 2. At C2-C3 there is moderate right foraminal narrowing due to small disc protrusion and right uncovertebral spurring and facet hypertrophy. There did not appear to be any definite nerve root compression. 3. At C3-C4, there is moderate spinal stenosis and moderate right foraminal narrowing. There does not appear to be any definite nerve root compression. 4. Since the MRI dated 09/06/2010, there has been fusion from C4 through C7. There continues to be some degenerative changes at these levels and mild residual spinal stenosis at  C4-C5 and C6-C7. At C4-C5 there is mild to moderate right foraminal narrowing, at C5-C6 there is mild bilateral foraminal narrowing and a C6-C7 there is mild to moderate right foraminal narrowing. There does not appear to be any nerve root compression at these levels.  * MRI scan images were reviewed online. I agree with the written report.    Assessment/Plan:  1.  Memory disturbance  2.  Gait disturbance  3.  History of closed head injury  The patient will continue on the Aricept, he is not getting a medication through this office.  We will follow-up with him if needed.  I have recommended the possibility of physical therapy for his gait disturbance, if his shoulders remain a problem, and orthopedic surgery referral may need to be considered.  Jill Alexanders MD 02/02/2018 10:24 AM  [facility] [address] [place], [place] [address]  Phone [phone] Fax [phone]

## 2018-02-04 ENCOUNTER — Other Ambulatory Visit: Payer: Self-pay

## 2018-02-04 ENCOUNTER — Encounter: Payer: Medicare Other | Attending: Registered Nurse

## 2018-02-04 ENCOUNTER — Encounter: Payer: Self-pay | Admitting: Physical Medicine & Rehabilitation

## 2018-02-04 ENCOUNTER — Ambulatory Visit: Payer: Medicare Other | Admitting: Physical Medicine & Rehabilitation

## 2018-02-04 VITALS — BP 138/84 | HR 62 | Ht 68.0 in | Wt 179.0 lb

## 2018-02-04 DIAGNOSIS — Z87442 Personal history of urinary calculi: Secondary | ICD-10-CM | POA: Diagnosis not present

## 2018-02-04 DIAGNOSIS — M25512 Pain in left shoulder: Secondary | ICD-10-CM

## 2018-02-04 DIAGNOSIS — M4802 Spinal stenosis, cervical region: Secondary | ICD-10-CM

## 2018-02-04 DIAGNOSIS — M542 Cervicalgia: Secondary | ICD-10-CM | POA: Diagnosis not present

## 2018-02-04 DIAGNOSIS — M25511 Pain in right shoulder: Secondary | ICD-10-CM

## 2018-02-04 DIAGNOSIS — G8929 Other chronic pain: Secondary | ICD-10-CM | POA: Insufficient documentation

## 2018-02-04 DIAGNOSIS — I10 Essential (primary) hypertension: Secondary | ICD-10-CM | POA: Diagnosis not present

## 2018-02-04 DIAGNOSIS — Z8249 Family history of ischemic heart disease and other diseases of the circulatory system: Secondary | ICD-10-CM | POA: Diagnosis not present

## 2018-02-04 DIAGNOSIS — Z87891 Personal history of nicotine dependence: Secondary | ICD-10-CM | POA: Insufficient documentation

## 2018-02-04 DIAGNOSIS — H9190 Unspecified hearing loss, unspecified ear: Secondary | ICD-10-CM | POA: Diagnosis not present

## 2018-02-04 DIAGNOSIS — Z981 Arthrodesis status: Secondary | ICD-10-CM | POA: Insufficient documentation

## 2018-02-04 DIAGNOSIS — Z823 Family history of stroke: Secondary | ICD-10-CM | POA: Diagnosis not present

## 2018-02-04 DIAGNOSIS — E785 Hyperlipidemia, unspecified: Secondary | ICD-10-CM | POA: Insufficient documentation

## 2018-02-04 DIAGNOSIS — Z841 Family history of disorders of kidney and ureter: Secondary | ICD-10-CM | POA: Diagnosis not present

## 2018-02-04 MED ORDER — OXYCODONE HCL 5 MG PO TABS: 5.0000 mg | ORAL_TABLET | Freq: Three times a day (TID) | ORAL | 0 refills | Status: DC | PRN

## 2018-02-04 NOTE — Progress Notes (Signed)
Subjective:    Patient ID: Fred [name], male    DOB: 25-Aug-1950, 67 y.o.   MRN: [id number]  HPI   CC Neck and low back pain 67 yo male with cervical stenosis , had a repeat MRI C spine and neuro f/u , no severe stenosis Interval hx PE now on Xarelto  Has lost ~20lb by dieting Pt remains active, does his own yard work Bilateral shoulder pain when elevating arms, had xray evidence of left acromioclavicular arthritis.  No evidence of glenohumeral arthritis on either side. Shoulder pain does not appear to be connected to his neck pain.  He does not have any radiating pain or numbness tingling in the shoulder area Pain Inventory Average Pain 4 Pain Right Now 4 My pain is stabbing, tingling and aching  In the last 24 hours, has pain interfered with the following? General activity 4 Relation with others 3 Enjoyment of life 4 What TIME of day is your pain at its worst? daytime evening night Sleep (in general) Fair  Pain is worse with: walking and standing Pain improves with: medication Relief from Meds: 5  Mobility ability to climb steps?  yes do you drive?  yes  Function disabled: date disabled n/a  Neuro/Psych numbness loss of taste or smell  Prior Studies Any changes since last visit?  no  Physicians involved in your care Any changes since last visit?  no   Family History  Problem Relation Age of Onset  . Stroke Mother   . Dementia Mother   . Heart disease Father   . Stroke Father        heat stroke  . Kidney disease Brother   . Dementia Brother    Social History   Socioeconomic History  . Marital status: Married    Spouse name: Not on file  . Number of children: 6  . Years of education: 12+  . Highest education level: Not on file  Occupational History  . Occupation: Retired  Scientific laboratory technician  . Financial resource strain: Not on file  . Food insecurity:    Worry: Not on file    Inability: Not on file  . Transportation needs:    Medical: Not on  file    Non-medical: Not on file  Tobacco Use  . Smoking status: Former Smoker    Packs/day: 1.00    Years: 30.00    Pack years: 30.00    Types: Cigarettes    Last attempt to quit: 05/25/2001    Years since quitting: 16.7  . Smokeless tobacco: Former Systems developer    Quit date: 09/22/2001  Substance and Sexual Activity  . Alcohol use: No  . Drug use: No  . Sexual activity: Not on file  Lifestyle  . Physical activity:    Days per week: Not on file    Minutes per session: Not on file  . Stress: Not on file  Relationships  . Social connections:    Talks on phone: Not on file    Gets together: Not on file    Attends religious service: Not on file    Active member of club or organization: Not on file    Attends meetings of clubs or organizations: Not on file    Relationship status: Not on file  Other Topics Concern  . Not on file  Social History Narrative   Lives with wife   Caffeine use: Coffee daily (1-2 per day)   Some soda   Right handed  Past Surgical History:  Procedure Laterality Date  . BRAIN SURGERY     inserted rod to connect to BaHa(Hearing device)  . EYE SURGERY     lasik  . LYMPHADENECTOMY Bilateral 08/04/2013   Procedure: LYMPHADENECTOMY;  Surgeon: Dutch Gray, MD;  Location: [facility];  Service: Urology;  Laterality: Bilateral;  . PROSTATE SURGERY    . ROBOT ASSISTED LAPAROSCOPIC RADICAL PROSTATECTOMY N/A 08/04/2013   Procedure: ROBOTIC ASSISTED LAPAROSCOPIC RADICAL PROSTATECTOMY LEVEL 2;  Surgeon: Dutch Gray, MD;  Location: [facility];  Service: Urology;  Laterality: N/A;  . SPINE SURGERY     cervical disc removed and stabalized with bone- good mobility   Past Medical History:  Diagnosis Date  . Cancer [facility])    prostate  . Depression   . Gait abnormality 07/22/2017  . Hearing loss   . History of kidney stones   . HOH (hard of hearing)   . Hyperlipidemia   . Hypertension   . Memory disorder 07/22/2017  . Neuromuscular disorder ([facility])    BP 138/84   Pulse 62   Ht 5'  8" (1.727 m)   Wt 179 lb (81.2 kg)   SpO2 93%   BMI 27.22 kg/m   Opioid Risk Score:   Fall Risk Score:  `1  Depression screen PHQ 2/9  Depression screen [name] 2/9 02/04/2018 11/03/2017 02/19/2017 12/12/2014 08/28/2014  Decreased Interest 0 0 0 0 2  Down, Depressed, Hopeless 0 0 0 0 1  PHQ - 2 Score 0 0 0 0 3  Altered sleeping - - - - 1  Tired, decreased energy - - - - 2  Change in appetite - - - - 0  Feeling bad or failure about yourself  - - - - 0  Trouble concentrating - - - - 1  Moving slowly or fidgety/restless - - - - 0  Suicidal thoughts - - - - 0  PHQ-9 Score - - - - 7    Review of Systems  Constitutional: Negative.   HENT: Negative.   Eyes: Negative.   Respiratory: Negative.   Cardiovascular: Negative.   Gastrointestinal: Negative.   Endocrine: Negative.   Genitourinary: Negative.   Musculoskeletal: Negative.   Skin: Negative.   Allergic/Immunologic: Negative.   Neurological: Negative.   Hematological: Negative.   Psychiatric/Behavioral: Negative.   All other systems reviewed and are negative.      Objective:   Physical Exam  Constitutional: He appears well-developed and well-nourished. No distress.  HENT:  Head: Normocephalic and atraumatic.  Eyes: Pupils are equal, round, and reactive to light.  Neck: Normal range of motion.  Musculoskeletal: Normal range of motion.  Skin: Skin is warm and dry. He is not diaphoretic.  Psychiatric: He has a normal mood and affect.  Nursing note and vitals reviewed.  Patient with impingement signs bilaterally at 90 degrees also has pain with shoulder abduction.  His pain is mainly in the anterior area over the acromioclavicular joint. Neck range of motion is diminished to 50% flexion extension lateral bending and rotation Lumbar spine range of motion 50% flexion extension lateral bending and rotation. There is no tenderness palpation lumbar paraspinal muscles.        Assessment & Plan:  1.  History of cervical  stenosis with cervical postlaminectomy syndrome status post C4-C7 ACDF in 2013  Continue oxycodone 5 mg 3 times daily Reviewed PMP aware website, no red flags Reviewed last urine drug screen which was performed on 12/03/2017 and was appropriate Patient receives 90 tablets/month  with accurate pill counts Nurse practitioner visit in 1 month  2 bilateral shoulder pain has exam consistent with [facility] joint arthritis as well as possible rotator cuff syndrome.  His pain appears to be more anterior implicating AC joint.  I discussed injections if his pendulum exercises are not helpful.

## 2018-02-04 NOTE — Patient Instructions (Signed)

## 2018-03-04 ENCOUNTER — Ambulatory Visit: Payer: Medicare Other | Admitting: Registered Nurse

## 2018-03-05 ENCOUNTER — Encounter: Payer: Self-pay | Admitting: Registered Nurse

## 2018-03-05 ENCOUNTER — Encounter: Payer: Medicare Other | Attending: Registered Nurse | Admitting: Registered Nurse

## 2018-03-05 VITALS — BP 123/81 | HR 76 | Resp 14 | Ht 68.0 in | Wt 182.0 lb

## 2018-03-05 DIAGNOSIS — G894 Chronic pain syndrome: Secondary | ICD-10-CM

## 2018-03-05 DIAGNOSIS — M545 Low back pain, unspecified: Secondary | ICD-10-CM

## 2018-03-05 DIAGNOSIS — M25511 Pain in right shoulder: Secondary | ICD-10-CM | POA: Insufficient documentation

## 2018-03-05 DIAGNOSIS — M4802 Spinal stenosis, cervical region: Secondary | ICD-10-CM | POA: Diagnosis not present

## 2018-03-05 DIAGNOSIS — Z981 Arthrodesis status: Secondary | ICD-10-CM | POA: Insufficient documentation

## 2018-03-05 DIAGNOSIS — G8929 Other chronic pain: Secondary | ICD-10-CM | POA: Insufficient documentation

## 2018-03-05 DIAGNOSIS — M542 Cervicalgia: Secondary | ICD-10-CM | POA: Insufficient documentation

## 2018-03-05 DIAGNOSIS — M2550 Pain in unspecified joint: Secondary | ICD-10-CM

## 2018-03-05 DIAGNOSIS — Z79899 Other long term (current) drug therapy: Secondary | ICD-10-CM

## 2018-03-05 DIAGNOSIS — Z87442 Personal history of urinary calculi: Secondary | ICD-10-CM | POA: Insufficient documentation

## 2018-03-05 DIAGNOSIS — M546 Pain in thoracic spine: Secondary | ICD-10-CM

## 2018-03-05 DIAGNOSIS — H9190 Unspecified hearing loss, unspecified ear: Secondary | ICD-10-CM | POA: Insufficient documentation

## 2018-03-05 DIAGNOSIS — M5412 Radiculopathy, cervical region: Secondary | ICD-10-CM

## 2018-03-05 DIAGNOSIS — Z823 Family history of stroke: Secondary | ICD-10-CM | POA: Insufficient documentation

## 2018-03-05 DIAGNOSIS — Z5181 Encounter for therapeutic drug level monitoring: Secondary | ICD-10-CM

## 2018-03-05 DIAGNOSIS — Z841 Family history of disorders of kidney and ureter: Secondary | ICD-10-CM | POA: Diagnosis not present

## 2018-03-05 DIAGNOSIS — Z87891 Personal history of nicotine dependence: Secondary | ICD-10-CM | POA: Insufficient documentation

## 2018-03-05 DIAGNOSIS — E785 Hyperlipidemia, unspecified: Secondary | ICD-10-CM | POA: Diagnosis not present

## 2018-03-05 DIAGNOSIS — Z8249 Family history of ischemic heart disease and other diseases of the circulatory system: Secondary | ICD-10-CM | POA: Insufficient documentation

## 2018-03-05 DIAGNOSIS — M961 Postlaminectomy syndrome, not elsewhere classified: Secondary | ICD-10-CM | POA: Diagnosis not present

## 2018-03-05 DIAGNOSIS — I10 Essential (primary) hypertension: Secondary | ICD-10-CM | POA: Insufficient documentation

## 2018-03-05 DIAGNOSIS — M25512 Pain in left shoulder: Secondary | ICD-10-CM | POA: Insufficient documentation

## 2018-03-05 DIAGNOSIS — M7918 Myalgia, other site: Secondary | ICD-10-CM

## 2018-03-05 MED ORDER — OXYCODONE HCL 5 MG PO TABS: 5.0000 mg | ORAL_TABLET | Freq: Three times a day (TID) | ORAL | 0 refills | Status: DC | PRN

## 2018-03-05 NOTE — Progress Notes (Signed)
Subjective:    Patient ID: Fred [name], male    DOB: 12/03/1950, 67 y.o.   MRN: [id number]  HPI: [name] is a 67 year old male who returns for follow up appointment for chronic pain and medication refill. He states his pain is located in his neck radiating into his bilateral shoulders, upper- lower back pain and also reports generalized joint pain. He rates his pain 4. His current exercise regime is walking and performing stretching exercises.   Fred [name] Morphine Equivalent is 22.50 MME. Last Oral Swab was Performed on 10/01/2017, it was consistent.    Pain Inventory Average Pain 5 Pain Right Now 4 My pain is sharp, stabbing, tingling and aching  In the last 24 hours, has pain interfered with the following? General activity 3 Relation with others 3 Enjoyment of life 3 What TIME of day is your pain at its worst? daytime, evening  Sleep (in general) Fair  Pain is worse with: walking, sitting, standing and some activites Pain improves with: medication Relief from Meds: 2  Mobility walk without assistance ability to climb steps?  yes do you drive?  yes  Function retired  Neuro/Psych No problems in this area  Prior Studies Any changes since last visit?  no  Physicians involved in your care Any changes since last visit?  no   Family History  Problem Relation Age of Onset  . Stroke Mother   . Dementia Mother   . Heart disease Father   . Stroke Father        heat stroke  . Kidney disease Brother   . Dementia Brother    Social History   Socioeconomic History  . Marital status: Married    Spouse name: Not on file  . Number of children: 6  . Years of education: 12+  . Highest education level: Not on file  Occupational History  . Occupation: Retired  Scientific laboratory technician  . Financial resource strain: Not on file  . Food insecurity:    Worry: Not on file    Inability: Not on file  . Transportation needs:    Medical: Not on file    Non-medical:  Not on file  Tobacco Use  . Smoking status: Former Smoker    Packs/day: 1.00    Years: 30.00    Pack years: 30.00    Types: Cigarettes    Last attempt to quit: 05/25/2001    Years since quitting: 16.7  . Smokeless tobacco: Former Systems developer    Quit date: 09/22/2001  Substance and Sexual Activity  . Alcohol use: No  . Drug use: No  . Sexual activity: Not on file  Lifestyle  . Physical activity:    Days per week: Not on file    Minutes per session: Not on file  . Stress: Not on file  Relationships  . Social connections:    Talks on phone: Not on file    Gets together: Not on file    Attends religious service: Not on file    Active member of club or organization: Not on file    Attends meetings of clubs or organizations: Not on file    Relationship status: Not on file  Other Topics Concern  . Not on file  Social History Narrative   Lives with wife   Caffeine use: Coffee daily (1-2 per day)   Some soda   Right handed    Past Surgical History:  Procedure Laterality Date  . BRAIN SURGERY  inserted rod to connect to BaHa(Hearing device)  . EYE SURGERY     lasik  . LYMPHADENECTOMY Bilateral 08/04/2013   Procedure: LYMPHADENECTOMY;  Surgeon: Dutch Gray, MD;  Location: [facility];  Service: Urology;  Laterality: Bilateral;  . PROSTATE SURGERY    . ROBOT ASSISTED LAPAROSCOPIC RADICAL PROSTATECTOMY N/A 08/04/2013   Procedure: ROBOTIC ASSISTED LAPAROSCOPIC RADICAL PROSTATECTOMY LEVEL 2;  Surgeon: Dutch Gray, MD;  Location: [facility];  Service: Urology;  Laterality: N/A;  . SPINE SURGERY     cervical disc removed and stabalized with bone- good mobility   Past Medical History:  Diagnosis Date  . Cancer [facility])    prostate  . Depression   . Gait abnormality 07/22/2017  . Hearing loss   . History of kidney stones   . HOH (hard of hearing)   . Hyperlipidemia   . Hypertension   . Memory disorder 07/22/2017  . Neuromuscular disorder (HCC)    BP 123/81   Pulse 76   Resp 14   Ht 5\' 8"  (1.727  m)   Wt 182 lb (82.6 kg)   SpO2 98%   BMI 27.67 kg/m   Opioid Risk Score:   Fall Risk Score:  `1  Depression screen PHQ 2/9  Depression screen [facility] 2/9 02/04/2018 11/03/2017 02/19/2017 12/12/2014 08/28/2014  Decreased Interest 0 0 0 0 2  Down, Depressed, Hopeless 0 0 0 0 1  PHQ - 2 Score 0 0 0 0 3  Altered sleeping - - - - 1  Tired, decreased energy - - - - 2  Change in appetite - - - - 0  Feeling bad or failure about yourself  - - - - 0  Trouble concentrating - - - - 1  Moving slowly or fidgety/restless - - - - 0  Suicidal thoughts - - - - 0  PHQ-9 Score - - - - 7    Review of Systems  Constitutional: Negative.   HENT: Negative.   Eyes: Negative.   Respiratory: Negative.   Cardiovascular: Negative.   Gastrointestinal: Negative.   Endocrine: Negative.   Musculoskeletal: Positive for arthralgias, back pain and neck pain.  Skin: Negative.   Allergic/Immunologic: Negative.   Neurological: Negative.   Hematological: Negative.   Psychiatric/Behavioral: Negative.   All other systems reviewed and are negative.      Objective:   Physical Exam  Constitutional: He is oriented to person, place, and time. He appears well-developed and well-nourished.  HENT:  Head: Normocephalic and atraumatic.  Neck: Normal range of motion. Neck supple.  Cardiovascular: Normal rate and regular rhythm.  Pulmonary/Chest: Effort normal and breath sounds normal.  Musculoskeletal:  Normal Muscle Bulk and Muscle Testing Reveals: Upper Extremities: Decreased ROM 90 Degrees and Muscle Strength 4/5 Thoracic Paraspinal Tenderness: T-7-T-9 Lumbar Paraspinal Tenderness: L-3-L-5 Lower Extremities: Full ROM and Muscle Strength 5/5 Arises from chair with ease Narrow Based gait  Neurological: He is alert and oriented to person, place, and time.  Skin: Skin is warm and dry.  Psychiatric: He has a normal mood and affect. His behavior is normal.  Nursing note and vitals reviewed.         Assessment &  Plan:  1.Cervical Postlaminectomy: Continue HEP and Continue current medication regime. 03/05/2018 2. Cervicalgia/ Cervical Radiculitis: Continue current medication regime. Continue to Monitor. 03/05/2018 3. Chronic BilateralShoulder Pain: Continue HEP as Tolerated. 03/05/2018 4. L2 compression fracture/ Chronic Bilateral Low Back Pain without Sciatica: Continue to Monitor: 03/05/2018 Refilled:Oxycodone 5 mg one tablet every 8hours as needed  for moderate pain. # 90. We will Continue the opioid monitoring program. This consists of regular clinic visits, examinations, urine drug screen, pill counts as well as use of [place] controlled substance reporting System. 5. Polyarthralgia: Continue to Monitor. 10/18//2019 6. Muscle Spasm:Continuecurrent medication regimen with: Flexeril.03/05/2018  20 minutes of face to face patient care time was spent during this visit. All questions were encouraged and answered.  F/U in 6month

## 2018-04-05 ENCOUNTER — Other Ambulatory Visit: Payer: Self-pay

## 2018-04-05 ENCOUNTER — Telehealth: Payer: Self-pay | Admitting: *Deleted

## 2018-04-05 ENCOUNTER — Encounter: Payer: Medicare Other | Attending: Registered Nurse | Admitting: Registered Nurse

## 2018-04-05 ENCOUNTER — Encounter: Payer: Self-pay | Admitting: Registered Nurse

## 2018-04-05 VITALS — BP 207/84 | HR 63 | Ht 68.0 in | Wt 185.0 lb

## 2018-04-05 DIAGNOSIS — M7918 Myalgia, other site: Secondary | ICD-10-CM

## 2018-04-05 DIAGNOSIS — M25512 Pain in left shoulder: Secondary | ICD-10-CM | POA: Insufficient documentation

## 2018-04-05 DIAGNOSIS — E785 Hyperlipidemia, unspecified: Secondary | ICD-10-CM | POA: Insufficient documentation

## 2018-04-05 DIAGNOSIS — G8929 Other chronic pain: Secondary | ICD-10-CM | POA: Insufficient documentation

## 2018-04-05 DIAGNOSIS — M542 Cervicalgia: Secondary | ICD-10-CM | POA: Insufficient documentation

## 2018-04-05 DIAGNOSIS — Z981 Arthrodesis status: Secondary | ICD-10-CM | POA: Diagnosis not present

## 2018-04-05 DIAGNOSIS — M5412 Radiculopathy, cervical region: Secondary | ICD-10-CM | POA: Diagnosis not present

## 2018-04-05 DIAGNOSIS — M546 Pain in thoracic spine: Secondary | ICD-10-CM

## 2018-04-05 DIAGNOSIS — Z5181 Encounter for therapeutic drug level monitoring: Secondary | ICD-10-CM

## 2018-04-05 DIAGNOSIS — Z8249 Family history of ischemic heart disease and other diseases of the circulatory system: Secondary | ICD-10-CM | POA: Insufficient documentation

## 2018-04-05 DIAGNOSIS — Z823 Family history of stroke: Secondary | ICD-10-CM | POA: Diagnosis not present

## 2018-04-05 DIAGNOSIS — I10 Essential (primary) hypertension: Secondary | ICD-10-CM

## 2018-04-05 DIAGNOSIS — Z87442 Personal history of urinary calculi: Secondary | ICD-10-CM | POA: Insufficient documentation

## 2018-04-05 DIAGNOSIS — H9190 Unspecified hearing loss, unspecified ear: Secondary | ICD-10-CM | POA: Diagnosis not present

## 2018-04-05 DIAGNOSIS — Z841 Family history of disorders of kidney and ureter: Secondary | ICD-10-CM | POA: Diagnosis not present

## 2018-04-05 DIAGNOSIS — M545 Low back pain: Secondary | ICD-10-CM

## 2018-04-05 DIAGNOSIS — M25511 Pain in right shoulder: Secondary | ICD-10-CM | POA: Diagnosis not present

## 2018-04-05 DIAGNOSIS — M4802 Spinal stenosis, cervical region: Secondary | ICD-10-CM | POA: Diagnosis not present

## 2018-04-05 DIAGNOSIS — M2550 Pain in unspecified joint: Secondary | ICD-10-CM

## 2018-04-05 DIAGNOSIS — M961 Postlaminectomy syndrome, not elsewhere classified: Secondary | ICD-10-CM | POA: Diagnosis not present

## 2018-04-05 DIAGNOSIS — Z87891 Personal history of nicotine dependence: Secondary | ICD-10-CM | POA: Diagnosis not present

## 2018-04-05 DIAGNOSIS — Z79899 Other long term (current) drug therapy: Secondary | ICD-10-CM

## 2018-04-05 MED ORDER — OXYCODONE HCL 5 MG PO TABS: 5.0000 mg | ORAL_TABLET | Freq: Three times a day (TID) | ORAL | 0 refills | Status: DC | PRN

## 2018-04-05 NOTE — Progress Notes (Signed)
Subjective:    Patient ID: Fred [name], male    DOB: 27-Jun-1950, 67 y.o.   MRN: [id number]  HPI: [name] is a 67 year old male who returns for follow up appointment for chronic pain and medication refill. He states his pain is located in his neck radiating into his bilateral shoulders, upper and lower back pain. He rates his pain 4. His current exercise regime is walking.  [name] arrived hypertensive, blood pressure re-checked, [name] reports he had taken his anti-hypertensive medication this morning. [facility] was called medication was mailed on 03/08/2018. [name] called his wife she reports he has the medication at their home, [name] refuses ED evaluation, and reports he will go home and check his pill organizer and Mrs. Fred [name] is awre of the above. Instructed to keep blood pressure journal and follow up with PCP they verbalize understanding.   [name] Morphine Equivalent is 22.50 MME. Last Oral Swab was Performed on 10/01/2017, it was consistent.   Pain Inventory Average Pain 5 Pain Right Now 4 My pain is sharp, stabbing, tingling and aching  In the last 24 hours, has pain interfered with the following? General activity 3 Relation with others 3 Enjoyment of life 4 What TIME of day is your pain at its worst? daytime and evening Sleep (in general) Fair  Pain is worse with: walking, bending, sitting, standing and some activites Pain improves with: medication Relief from Meds: 5  Mobility how many minutes can you walk? 10 do you drive?  yes  Function disabled: date disabled n/a  Neuro/Psych numbness tingling depression loss of taste or smell  Prior Studies Any changes since last visit?  no  Physicians involved in your care Any changes since last visit?  no   Family History  Problem Relation Age of Onset  . Stroke Mother   . Dementia Mother   . Heart disease Father   . Stroke Father        heat stroke  . Kidney  disease Brother   . Dementia Brother    Social History   Socioeconomic History  . Marital status: Married    Spouse name: Not on file  . Number of children: 6  . Years of education: 12+  . Highest education level: Not on file  Occupational History  . Occupation: Retired  Scientific laboratory technician  . Financial resource strain: Not on file  . Food insecurity:    Worry: Not on file    Inability: Not on file  . Transportation needs:    Medical: Not on file    Non-medical: Not on file  Tobacco Use  . Smoking status: Former Smoker    Packs/day: 1.00    Years: 30.00    Pack years: 30.00    Types: Cigarettes    Last attempt to quit: 05/25/2001    Years since quitting: 16.8  . Smokeless tobacco: Former Systems developer    Quit date: 09/22/2001  Substance and Sexual Activity  . Alcohol use: No  . Drug use: No  . Sexual activity: Not on file  Lifestyle  . Physical activity:    Days per week: Not on file    Minutes per session: Not on file  . Stress: Not on file  Relationships  . Social connections:    Talks on phone: Not on file    Gets together: Not on file    Attends religious service: Not on file    Active member of  club or organization: Not on file    Attends meetings of clubs or organizations: Not on file    Relationship status: Not on file  Other Topics Concern  . Not on file  Social History Narrative   Lives with wife   Caffeine use: Coffee daily (1-2 per day)   Some soda   Right handed    Past Surgical History:  Procedure Laterality Date  . BRAIN SURGERY     inserted rod to connect to BaHa(Hearing device)  . EYE SURGERY     lasik  . LYMPHADENECTOMY Bilateral 08/04/2013   Procedure: LYMPHADENECTOMY;  Surgeon: Dutch Gray, MD;  Location: [facility];  Service: Urology;  Laterality: Bilateral;  . PROSTATE SURGERY    . ROBOT ASSISTED LAPAROSCOPIC RADICAL PROSTATECTOMY N/A 08/04/2013   Procedure: ROBOTIC ASSISTED LAPAROSCOPIC RADICAL PROSTATECTOMY LEVEL 2;  Surgeon: Dutch Gray, MD;  Location: [facility];  Service: Urology;  Laterality: N/A;  . SPINE SURGERY     cervical disc removed and stabalized with bone- good mobility   Past Medical History:  Diagnosis Date  . Cancer [facility])    prostate  . Depression   . Gait abnormality 07/22/2017  . Hearing loss   . History of kidney stones   . HOH (hard of hearing)   . Hyperlipidemia   . Hypertension   . Memory disorder 07/22/2017  . Neuromuscular disorder (HCC)    BP (!) 168/110   Pulse (!) 57   Ht 5\' 8"  (1.727 m)   Wt 185 lb (83.9 kg)   SpO2 96%   BMI 28.13 kg/m   Opioid Risk Score:   Fall Risk Score:  `1  Depression screen PHQ 2/9  Depression screen [facility] 2/9 04/05/2018 02/04/2018 11/03/2017 02/19/2017 12/12/2014 08/28/2014  Decreased Interest 1 0 0 0 0 2  Down, Depressed, Hopeless 1 0 0 0 0 1  PHQ - 2 Score 2 0 0 0 0 3  Altered sleeping - - - - - 1  Tired, decreased energy - - - - - 2  Change in appetite - - - - - 0  Feeling bad or failure about yourself  - - - - - 0  Trouble concentrating - - - - - 1  Moving slowly or fidgety/restless - - - - - 0  Suicidal thoughts - - - - - 0  PHQ-9 Score - - - - - 7    Review of Systems  Constitutional: Negative.   HENT: Negative.   Eyes: Negative.   Respiratory: Negative.   Cardiovascular: Negative.   Gastrointestinal: Negative.   Endocrine: Negative.   Genitourinary: Negative.   Musculoskeletal: Negative.   Skin: Negative.   Allergic/Immunologic: Negative.   Neurological: Negative.   Hematological: Negative.   Psychiatric/Behavioral: Negative.   All other systems reviewed and are negative.      Objective:   Physical Exam  Constitutional: He is oriented to person, place, and time. He appears well-developed and well-nourished.  HENT:  Head: Normocephalic and atraumatic.  Neck: Normal range of motion. Neck supple.  Cervical Paraspinal Tenderness: C-5-C-6  Cardiovascular: Normal rate and regular rhythm.  Pulmonary/Chest: Effort normal and breath sounds normal.    Musculoskeletal:  Normal Muscle Bulk and Muscle Testing Reveals: Upper Extremities: Decreased l ROM and Muscle Strength 5/5 Bilateral AC Joint Tenderness Thoracic Paraspinal Tenderness: T-1-T-7 Lumbar Hypersensitivity Lower Extremities: Full ROM and Muscle Strength 5/5 Arises from chair with ease Narrow Based gait   Neurological: He is alert and oriented to person, place,  and time.  Skin: Skin is warm and dry.  Psychiatric: He has a normal mood and affect. His behavior is normal.  Nursing note and vitals reviewed.         Assessment & Plan:  1.Cervical Postlaminectomy: Continue HEP and Continue current medication regime. 04/05/2018 2. Cervicalgia/ Cervical Radiculitis: Continue current medication regime. Continue to Monitor. 04/05/2018 3. Chronic BilateralShoulder Pain: Continue HEP as Tolerated. 04/05/2018 4. L2 compression fracture/ Chronic Bilateral Low Back Pain without Sciatica: Continue to Monitor: 04/05/2018 Refilled:Oxycodone 5 mg one tablet every 8hours as needed for moderate pain. # 90. We will Continue the opioid monitoring program. This consists of regular clinic visits, examinations, urine drug screen, pill counts as well as use of [place] controlled substance reporting System. 5. Polyarthralgia: Continue to Monitor. 11/18//2019 6. Muscle Spasm:Continuecurrent medication regimen with: Flexeril.04/05/2018 7. Uncontrolled Hypertension: Refuses ED evaluation. Instructed to keep blood pressure journal and F.U with PCP he verbalizes understanding.   30 minutes of face to face patient care time was spent during this visit. All questions were encouraged and answered.  F/U in 75month

## 2018-04-05 NOTE — Telephone Encounter (Signed)
Fred [name] called to report to Fred [name] that his BP is down to 135/91 and he took 1/2 bp pill. She wants him to keep a diary and to only take medication as prescribed. Notified Fred [name].

## 2018-04-30 ENCOUNTER — Other Ambulatory Visit: Payer: Self-pay | Admitting: Urology

## 2018-05-05 ENCOUNTER — Encounter: Payer: Self-pay | Admitting: Registered Nurse

## 2018-05-05 ENCOUNTER — Encounter: Payer: Medicare Other | Attending: Registered Nurse | Admitting: Registered Nurse

## 2018-05-05 VITALS — BP 156/111 | HR 64 | Resp 14 | Ht 68.0 in | Wt 184.0 lb

## 2018-05-05 DIAGNOSIS — M545 Low back pain: Secondary | ICD-10-CM

## 2018-05-05 DIAGNOSIS — M25511 Pain in right shoulder: Secondary | ICD-10-CM | POA: Diagnosis not present

## 2018-05-05 DIAGNOSIS — G8929 Other chronic pain: Secondary | ICD-10-CM

## 2018-05-05 DIAGNOSIS — Z87442 Personal history of urinary calculi: Secondary | ICD-10-CM | POA: Diagnosis not present

## 2018-05-05 DIAGNOSIS — Z981 Arthrodesis status: Secondary | ICD-10-CM | POA: Insufficient documentation

## 2018-05-05 DIAGNOSIS — M25512 Pain in left shoulder: Secondary | ICD-10-CM | POA: Diagnosis not present

## 2018-05-05 DIAGNOSIS — E785 Hyperlipidemia, unspecified: Secondary | ICD-10-CM | POA: Insufficient documentation

## 2018-05-05 DIAGNOSIS — M5412 Radiculopathy, cervical region: Secondary | ICD-10-CM | POA: Diagnosis not present

## 2018-05-05 DIAGNOSIS — M542 Cervicalgia: Secondary | ICD-10-CM | POA: Diagnosis not present

## 2018-05-05 DIAGNOSIS — Z87891 Personal history of nicotine dependence: Secondary | ICD-10-CM | POA: Insufficient documentation

## 2018-05-05 DIAGNOSIS — Z79899 Other long term (current) drug therapy: Secondary | ICD-10-CM

## 2018-05-05 DIAGNOSIS — Z8249 Family history of ischemic heart disease and other diseases of the circulatory system: Secondary | ICD-10-CM | POA: Insufficient documentation

## 2018-05-05 DIAGNOSIS — M546 Pain in thoracic spine: Secondary | ICD-10-CM

## 2018-05-05 DIAGNOSIS — M961 Postlaminectomy syndrome, not elsewhere classified: Secondary | ICD-10-CM

## 2018-05-05 DIAGNOSIS — I10 Essential (primary) hypertension: Secondary | ICD-10-CM | POA: Diagnosis not present

## 2018-05-05 DIAGNOSIS — M4802 Spinal stenosis, cervical region: Secondary | ICD-10-CM | POA: Diagnosis not present

## 2018-05-05 DIAGNOSIS — M2550 Pain in unspecified joint: Secondary | ICD-10-CM

## 2018-05-05 DIAGNOSIS — H9190 Unspecified hearing loss, unspecified ear: Secondary | ICD-10-CM | POA: Insufficient documentation

## 2018-05-05 DIAGNOSIS — Z823 Family history of stroke: Secondary | ICD-10-CM | POA: Insufficient documentation

## 2018-05-05 DIAGNOSIS — M7918 Myalgia, other site: Secondary | ICD-10-CM

## 2018-05-05 DIAGNOSIS — Z5181 Encounter for therapeutic drug level monitoring: Secondary | ICD-10-CM

## 2018-05-05 DIAGNOSIS — G894 Chronic pain syndrome: Secondary | ICD-10-CM

## 2018-05-05 DIAGNOSIS — Z841 Family history of disorders of kidney and ureter: Secondary | ICD-10-CM | POA: Insufficient documentation

## 2018-05-05 MED ORDER — OXYCODONE HCL 5 MG PO TABS: 5.0000 mg | ORAL_TABLET | Freq: Three times a day (TID) | ORAL | 0 refills | Status: DC | PRN

## 2018-05-05 NOTE — Progress Notes (Signed)
Subjective:    Patient ID: Fred [name], male    DOB: [birth year], 67 y.o.   MRN: [id number]  HPI: Fred [name] is a 67 y.o. male who returns for follow up appointment for chronic pain and medication refill. He states his pain is located in his neck radiating into his bilateral shoulders and mid-lower back pain.He rates his pain 3. His current exercise regime is walking and performing stretching exercises.  Fred [name] arrived to office hypertensive, he states he didn't take his antihypertensive medication this morning, because he was running late. He was educated on medication compliance he verbalizes understanding. He refuses ED evaluation. Placed a call to Mrs. Miyasato regarding the above she reports his antihypertensive  medication is in his pill container. Also states she's not sure if Fred [name] forgot to take his medication or he didn't remember,  she has noticed some memory changes  and also Fred [name] was late for his appointment today, she's not sure if it was traffic or if he was lost. She was encouraged to call his neurologist and asked her to assist Fred [name] with his medication and driving him to his appointments. She was very tearful and emotional support given. She verbalizes understanding.   Ms. Prevette called office around 11:20 reporting Fred [name] had taken his medication and his blood pressure is 120/91.    Fred [name] Morphine equivalent is 22.50  MME.  Last Oral Swab was Performed on 10/01/2017, it was consistent.   Pain Inventory Average Pain 4 Pain Right Now 3 My pain is sharp, stabbing and tingling  In the last 24 hours, has pain interfered with the following? General activity 4 Relation with others 4 Enjoyment of life 0 What TIME of day is your pain at its worst? evening Sleep (in general) Fair  Pain is worse with: walking, bending and standing Pain improves with: medication Relief from Meds: 5  Mobility walk without assistance ability  to climb steps?  yes do you drive?  yes  Function disabled: date disabled .  Neuro/Psych numbness tingling loss of taste or smell  Prior Studies Any changes since last visit?  no  Physicians involved in your care Any changes since last visit?  no   Family History  Problem Relation Age of Onset  . Stroke Mother   . Dementia Mother   . Heart disease Father   . Stroke Father        heat stroke  . Kidney disease Brother   . Dementia Brother    Social History   Socioeconomic History  . Marital status: Married    Spouse name: Not on file  . Number of children: 6  . Years of education: 12+  . Highest education level: Not on file  Occupational History  . Occupation: Retired  Scientific laboratory technician  . Financial resource strain: Not on file  . Food insecurity:    Worry: Not on file    Inability: Not on file  . Transportation needs:    Medical: Not on file    Non-medical: Not on file  Tobacco Use  . Smoking status: Former Smoker    Packs/day: 1.00    Years: 30.00    Pack years: 30.00    Types: Cigarettes    Last attempt to quit: 05/25/2001    Years since quitting: 16.9  . Smokeless tobacco: Former Systems developer    Quit date: 09/22/2001  Substance and Sexual Activity  . Alcohol use: No  . Drug use: No  .  Sexual activity: Not on file  Lifestyle  . Physical activity:    Days per week: Not on file    Minutes per session: Not on file  . Stress: Not on file  Relationships  . Social connections:    Talks on phone: Not on file    Gets together: Not on file    Attends religious service: Not on file    Active member of club or organization: Not on file    Attends meetings of clubs or organizations: Not on file    Relationship status: Not on file  Other Topics Concern  . Not on file  Social History Narrative   Lives with wife   Caffeine use: Coffee daily (1-2 per day)   Some soda   Right handed    Past Surgical History:  Procedure Laterality Date  . BRAIN SURGERY     inserted  rod to connect to BaHa(Hearing device)  . EYE SURGERY     lasik  . LYMPHADENECTOMY Bilateral 08/04/2013   Procedure: LYMPHADENECTOMY;  Surgeon: Dutch Gray, MD;  Location: [facility];  Service: Urology;  Laterality: Bilateral;  . PROSTATE SURGERY    . ROBOT ASSISTED LAPAROSCOPIC RADICAL PROSTATECTOMY N/A 08/04/2013   Procedure: ROBOTIC ASSISTED LAPAROSCOPIC RADICAL PROSTATECTOMY LEVEL 2;  Surgeon: Dutch Gray, MD;  Location: [facility];  Service: Urology;  Laterality: N/A;  . SPINE SURGERY     cervical disc removed and stabalized with bone- good mobility   Past Medical History:  Diagnosis Date  . Cancer [facility])    prostate  . Depression   . Gait abnormality 07/22/2017  . Hearing loss   . History of kidney stones   . HOH (hard of hearing)   . Hyperlipidemia   . Hypertension   . Memory disorder 07/22/2017  . Neuromuscular disorder (HCC)    BP (!) 164/105   Pulse 60   Resp 14   Ht 5\' 8"  (1.727 m)   Wt 184 lb (83.5 kg)   SpO2 96%   BMI 27.98 kg/m   Opioid Risk Score:   Fall Risk Score:  `1  Depression screen PHQ 2/9  Depression screen [facility] 2/9 04/05/2018 02/04/2018 11/03/2017 02/19/2017 12/12/2014 08/28/2014  Decreased Interest 1 0 0 0 0 2  Down, Depressed, Hopeless 1 0 0 0 0 1  PHQ - 2 Score 2 0 0 0 0 3  Altered sleeping - - - - - 1  Tired, decreased energy - - - - - 2  Change in appetite - - - - - 0  Feeling bad or failure about yourself  - - - - - 0  Trouble concentrating - - - - - 1  Moving slowly or fidgety/restless - - - - - 0  Suicidal thoughts - - - - - 0  PHQ-9 Score - - - - - 7    Review of Systems  Constitutional: Negative.   HENT: Negative.   Eyes: Negative.   Respiratory: Negative.   Gastrointestinal: Negative.   Endocrine: Negative.   Genitourinary: Negative.   Musculoskeletal: Positive for back pain and neck pain.  Skin: Negative.   Allergic/Immunologic: Negative.   Neurological: Positive for numbness.       Tingling  Psychiatric/Behavioral: Negative.   All other  systems reviewed and are negative.      Objective:   Physical Exam Vitals signs and nursing note reviewed.  Constitutional:      Appearance: Normal appearance.  Neck:     Musculoskeletal: Normal range of  motion and neck supple.     Comments: Cervical Paraspinal Tenderness: C-5-C-6 Cardiovascular:     Rate and Rhythm: Normal rate and regular rhythm.     Pulses: Normal pulses.     Heart sounds: Normal heart sounds.  Pulmonary:     Effort: Pulmonary effort is normal.     Breath sounds: Normal breath sounds.  Musculoskeletal:     Comments: Normal Muscle Bulk and Muscle Testing Reveals:  Upper Extremities: Decreased ROM 90 Degrees and ROM and Muscle Strength 4/5 Bilateral AC Joint Tenderness Lumbar Paraspinal Tenderness: L-4-L-5 Lower Extremities: Full ROM and Muscle Strength 5/5  Narrow Based Gait   Skin:    General: Skin is warm and dry.  Neurological:     Mental Status: He is alert and oriented to person, place, and time.  Psychiatric:        Mood and Affect: Mood normal.        Behavior: Behavior normal.           Assessment & Plan:  1.Cervical Postlaminectomy: Continue HEP and Continue current medication regime. 05/05/2018 2. Cervicalgia/ Cervical Radiculitis: Continue current medication regime. Continue to Monitor.05/05/2018 3. Chronic BilateralShoulder Pain: Continue HEP as Tolerated.05/05/2018 4. L2 compression fracture/ Chronic Bilateral Low Back Pain without Sciatica: Continue to Monitor:05/05/2018 Refilled:Oxycodone 5 mg one tablet every 8hours as needed for moderate pain. # 90. We will Continue the opioid monitoring program. This consists of regular clinic visits, examinations, urine drug screen, pill counts as well as use of [place] controlled substance reporting System. 5. Polyarthralgia: Continue to Monitor.12/18//2019 6. Muscle Spasm:Continuecurrent medication regimen with: Flexeril.05/05/2018 7. Uncontrolled Hypertension: Placed a call  to Mrs. Shutters regarding assisting Mr. Seefeldt with his antihypertensive medication: Fred [name] ED evaluation. Instructed to keep blood pressure journal and F.U with PCP he verbalizes understanding.   40 minutes of face to face patient care time was spent during this visit. All questions were encouraged and answered.  F/U in 55month

## 2018-05-05 NOTE — Progress Notes (Signed)
Reviewing pt chart for PAT 05-06-2018.  Noted the pt told pharmacy tech today that is procedure is cancelled.  Called and spoke w/ pt and his wife via phone .  They stated pt passed his stone and already had spoken with dr borden or scheduler, coni, and she is aware.  They stated that procedure is cancelled and will not be at PAT appointment 05-06-2018.

## 2018-05-06 ENCOUNTER — Encounter (HOSPITAL_COMMUNITY)
Admission: RE | Admit: 2018-05-06 | Discharge: 2018-05-06 | Disposition: A | Discharge status: Home / Self Care | Payer: Medicare Other | Source: Ambulatory Visit | Attending: Family Medicine | Admitting: Family Medicine

## 2018-05-11 ENCOUNTER — Encounter: Payer: Self-pay | Admitting: Registered Nurse

## 2018-05-13 NOTE — Patient Instructions (Addendum)
VISHAAL STROLLO  Aug 08, 1950    Your procedure is scheduled on:  05-17-2018   Report to [facility] Main  Entrance, Report to admitting at  8:00 AM    Call this number if you have problems the morning of surgery [phone]       Remember: Do not eat food or drink liquids :After Midnight.                                        BRUSH YOUR TEETH MORNING OF SURGERY AND RINSE YOUR MOUTH OUT, NO CHEWING GUM CANDY OR MINTS.        Take these medicines the morning of surgery with A SIP OF WATER:  Atorvastatin,  Donepezil (aricept),  Duloxetine (cymbalta)                                    You may not have any metal on your body including hair pins and               piercings  Do not wear jewelry, make-up, lotions, powders or perfumes, deodorant              Men may shave face and neck.       Do not bring valuables to the hospital. [place].  Contacts, dentures or bridgework may not be worn into surgery.  Leave suitcase in the car. After surgery it may be brought to your room.      Patients discharged the day of surgery will not be allowed to drive home.  Name and phone number of your driver:     _____________________________________________________________________             [facility] - Preparing for Surgery Before surgery, you can play an important role.  Because skin is not sterile, your skin needs to be as free of germs as possible.  You can reduce the number of germs on your skin by washing with CHG (chlorahexidine gluconate) soap before surgery.  CHG is an antiseptic cleaner which kills germs and bonds with the skin to continue killing germs even after washing. Please DO NOT use if you have an allergy to CHG or antibacterial soaps.  If your skin becomes reddened/irritated stop using the CHG and inform your nurse when you arrive at Short Stay. Do not shave (including legs and  underarms) for at least 48 hours prior to the first CHG shower.  You may shave your face/neck. Please follow these instructions carefully:  1.  Shower with CHG Soap the night before surgery and the  morning of Surgery.  2.  If you choose to wash your hair, wash your hair first as usual with your  normal  shampoo.  3.  After you shampoo, rinse your hair and body thoroughly to remove the  shampoo.                            4.  Use CHG as you would any other liquid soap.  You can apply chg directly  to the skin and wash  Gently with a scrungie or clean washcloth.  5.  Apply the CHG Soap to your body ONLY FROM THE NECK DOWN.   Do not use on face/ open                           Wound or open sores. Avoid contact with eyes, ears mouth and genitals (private parts).                       Wash face,  Genitals (private parts) with your normal soap.             6.  Wash thoroughly, paying special attention to the area where your surgery  will be performed.  7.  Thoroughly rinse your body with warm water from the neck down.  8.  DO NOT shower/wash with your normal soap after using and rinsing off  the CHG Soap.             9.  Pat yourself dry with a clean towel.            10.  Wear clean pajamas.            11.  Place clean sheets on your bed the night of your first shower and do not  sleep with pets. Day of Surgery : Do not apply any lotions/deodorants the morning of surgery.  Please wear clean clothes to the hospital/surgery center.  FAILURE TO FOLLOW THESE INSTRUCTIONS MAY RESULT IN THE CANCELLATION OF YOUR SURGERY PATIENT SIGNATURE_________________________________  NURSE SIGNATURE__________________________________  ________________________________________________________________________

## 2018-05-14 ENCOUNTER — Encounter (HOSPITAL_COMMUNITY): Payer: Self-pay

## 2018-05-14 ENCOUNTER — Encounter (HOSPITAL_COMMUNITY): Payer: Self-pay | Admitting: *Deleted

## 2018-05-14 ENCOUNTER — Encounter (HOSPITAL_COMMUNITY)
Admission: RE | Admit: 2018-05-14 | Discharge: 2018-05-14 | Disposition: A | Discharge status: Home / Self Care | Payer: Medicare Other | Source: Ambulatory Visit | Attending: Urology | Admitting: Urology

## 2018-05-14 ENCOUNTER — Other Ambulatory Visit: Payer: Self-pay

## 2018-05-14 DIAGNOSIS — R310 Gross hematuria: Secondary | ICD-10-CM | POA: Diagnosis not present

## 2018-05-14 DIAGNOSIS — T190XXA Foreign body in urethra, initial encounter: Secondary | ICD-10-CM | POA: Diagnosis not present

## 2018-05-14 DIAGNOSIS — N393 Stress incontinence (female) (male): Secondary | ICD-10-CM | POA: Diagnosis not present

## 2018-05-14 DIAGNOSIS — Z8546 Personal history of malignant neoplasm of prostate: Secondary | ICD-10-CM | POA: Diagnosis not present

## 2018-05-14 DIAGNOSIS — Z886 Allergy status to analgesic agent status: Secondary | ICD-10-CM | POA: Diagnosis not present

## 2018-05-14 DIAGNOSIS — F419 Anxiety disorder, unspecified: Secondary | ICD-10-CM | POA: Diagnosis not present

## 2018-05-14 DIAGNOSIS — Z01812 Encounter for preprocedural laboratory examination: Secondary | ICD-10-CM | POA: Insufficient documentation

## 2018-05-14 DIAGNOSIS — Z7901 Long term (current) use of anticoagulants: Secondary | ICD-10-CM | POA: Diagnosis not present

## 2018-05-14 DIAGNOSIS — X58XXXA Exposure to other specified factors, initial encounter: Secondary | ICD-10-CM | POA: Diagnosis not present

## 2018-05-14 DIAGNOSIS — F329 Major depressive disorder, single episode, unspecified: Secondary | ICD-10-CM | POA: Diagnosis not present

## 2018-05-14 DIAGNOSIS — K219 Gastro-esophageal reflux disease without esophagitis: Secondary | ICD-10-CM | POA: Diagnosis not present

## 2018-05-14 DIAGNOSIS — Z87891 Personal history of nicotine dependence: Secondary | ICD-10-CM | POA: Diagnosis not present

## 2018-05-14 DIAGNOSIS — I10 Essential (primary) hypertension: Secondary | ICD-10-CM | POA: Diagnosis not present

## 2018-05-14 DIAGNOSIS — E7800 Pure hypercholesterolemia, unspecified: Secondary | ICD-10-CM | POA: Diagnosis not present

## 2018-05-14 DIAGNOSIS — Z79899 Other long term (current) drug therapy: Secondary | ICD-10-CM | POA: Diagnosis not present

## 2018-05-14 HISTORY — DX: Other amnesia: R41.3

## 2018-05-14 HISTORY — DX: Malignant neoplasm of prostate: C61

## 2018-05-14 HISTORY — DX: Personal history of traumatic brain injury: Z87.820

## 2018-05-14 HISTORY — DX: Nocturia: R35.1

## 2018-05-14 LAB — CBC
HCT: 42.9 % (ref 39.0–52.0)
Hemoglobin: 13.9 g/dL (ref 13.0–17.0)
MCH: 28.4 pg (ref 26.0–34.0)
MCHC: 32.4 g/dL (ref 30.0–36.0)
MCV: 87.7 fL (ref 80.0–100.0)
Platelets: 201 10*3/uL (ref 150–400)
RBC: 4.89 MIL/uL (ref 4.22–5.81)
RDW: 12.9 % (ref 11.5–15.5)
WBC: 9.9 10*3/uL (ref 4.0–10.5)
nRBC: 0 % (ref 0.0–0.2)

## 2018-05-14 LAB — BASIC METABOLIC PANEL
Anion gap: 10 (ref 5–15)
BUN: 17 mg/dL (ref 8–23)
CO2: 27 mmol/L (ref 22–32)
Calcium: 9.1 mg/dL (ref 8.9–10.3)
Chloride: 102 mmol/L (ref 98–111)
Creatinine, Ser: 1.1 mg/dL (ref 0.61–1.24)
GFR calc Af Amer: >60 mL/min (ref >60)
GFR calc non Af Amer: >60 mL/min (ref >60)
GLUCOSE: 85 mg/dL (ref 70–99)
Potassium: 3.7 mmol/L (ref 3.5–5.1)
Sodium: 139 mmol/L (ref 135–145)

## 2018-05-14 NOTE — Progress Notes (Signed)
EKG dated 12-02-2017 in epic.  CXR/ Chest CT dated 12-02-2017 in epic.  ECHO dated 12-03-2017 in epic.  Called and spoke w/ coni, or scheduler for dr borden, to confirm with dr borden if pt to stop or continue xarelto.  Coni stated in dr borden office note , pt is to continue xarelto.  Pt and his wife at time of PAT appointment , verbalized understanding to not stop xarelto per dr borden.

## 2018-05-17 ENCOUNTER — Ambulatory Visit (HOSPITAL_COMMUNITY)
Admission: RE | Admit: 2018-05-17 | Discharge: 2018-05-17 | Disposition: A | Discharge status: Home / Self Care | Payer: Medicare Other | Attending: Urology | Admitting: Urology

## 2018-05-17 ENCOUNTER — Ambulatory Visit (HOSPITAL_COMMUNITY): Payer: Medicare Other | Admitting: Certified Registered Nurse Anesthetist

## 2018-05-17 ENCOUNTER — Ambulatory Visit (HOSPITAL_COMMUNITY): Payer: Medicare Other | Admitting: Physician Assistant

## 2018-05-17 ENCOUNTER — Encounter (HOSPITAL_COMMUNITY): Payer: Self-pay

## 2018-05-17 ENCOUNTER — Encounter (HOSPITAL_COMMUNITY)
Admission: RE | Disposition: A | Discharge status: Home / Self Care | Payer: Self-pay | Source: Home / Self Care | Attending: Urology

## 2018-05-17 DIAGNOSIS — I10 Essential (primary) hypertension: Secondary | ICD-10-CM | POA: Insufficient documentation

## 2018-05-17 DIAGNOSIS — Z87891 Personal history of nicotine dependence: Secondary | ICD-10-CM | POA: Insufficient documentation

## 2018-05-17 DIAGNOSIS — K219 Gastro-esophageal reflux disease without esophagitis: Secondary | ICD-10-CM | POA: Insufficient documentation

## 2018-05-17 DIAGNOSIS — R310 Gross hematuria: Secondary | ICD-10-CM | POA: Insufficient documentation

## 2018-05-17 DIAGNOSIS — E7800 Pure hypercholesterolemia, unspecified: Secondary | ICD-10-CM | POA: Diagnosis not present

## 2018-05-17 DIAGNOSIS — F329 Major depressive disorder, single episode, unspecified: Secondary | ICD-10-CM | POA: Insufficient documentation

## 2018-05-17 DIAGNOSIS — Z79899 Other long term (current) drug therapy: Secondary | ICD-10-CM | POA: Insufficient documentation

## 2018-05-17 DIAGNOSIS — N393 Stress incontinence (female) (male): Secondary | ICD-10-CM | POA: Insufficient documentation

## 2018-05-17 DIAGNOSIS — T190XXA Foreign body in urethra, initial encounter: Secondary | ICD-10-CM | POA: Insufficient documentation

## 2018-05-17 DIAGNOSIS — Z886 Allergy status to analgesic agent status: Secondary | ICD-10-CM | POA: Insufficient documentation

## 2018-05-17 DIAGNOSIS — F419 Anxiety disorder, unspecified: Secondary | ICD-10-CM | POA: Insufficient documentation

## 2018-05-17 DIAGNOSIS — Z7901 Long term (current) use of anticoagulants: Secondary | ICD-10-CM | POA: Insufficient documentation

## 2018-05-17 DIAGNOSIS — Z8546 Personal history of malignant neoplasm of prostate: Secondary | ICD-10-CM | POA: Insufficient documentation

## 2018-05-17 DIAGNOSIS — X58XXXA Exposure to other specified factors, initial encounter: Secondary | ICD-10-CM | POA: Insufficient documentation

## 2018-05-17 HISTORY — DX: Long term (current) use of anticoagulants: Z79.01

## 2018-05-17 HISTORY — DX: Other pulmonary embolism without acute cor pulmonale: I26.99

## 2018-05-17 HISTORY — PX: CYSTOSCOPY: SHX5120

## 2018-05-17 HISTORY — DX: Presence of dental prosthetic device (complete) (partial): Z97.2

## 2018-05-17 HISTORY — DX: Presence of external hearing-aid: Z97.4

## 2018-05-17 HISTORY — DX: Presence of spectacles and contact lenses: Z97.3

## 2018-05-17 SURGERY — CYSTOSCOPY
Anesthesia: General

## 2018-05-17 MED ORDER — CEFAZOLIN SODIUM-DEXTROSE 2-4 GM/100ML-% IV SOLN
2.0000 g | Freq: Once | INTRAVENOUS | Status: AC
  Administered 2018-05-17: 2 g via INTRAVENOUS

## 2018-05-17 MED ORDER — PHENAZOPYRIDINE HCL 200 MG PO TABS: 200.0000 mg | ORAL_TABLET | Freq: Three times a day (TID) | ORAL | 0 refills | Status: DC | PRN

## 2018-05-17 MED ORDER — MIDAZOLAM HCL 2 MG/2ML IJ SOLN
INTRAMUSCULAR | Status: AC
  Filled 2018-05-17: qty 2

## 2018-05-17 MED ORDER — LIDOCAINE 2% (20 MG/ML) 5 ML SYRINGE
INTRAMUSCULAR | Status: DC | PRN
  Administered 2018-05-17: 60 mg via INTRAVENOUS

## 2018-05-17 MED ORDER — LACTATED RINGERS IV SOLN
INTRAVENOUS | Status: DC
  Administered 2018-05-17: 1000 mL via INTRAVENOUS

## 2018-05-17 MED ORDER — FENTANYL CITRATE (PF) 100 MCG/2ML IJ SOLN: 25.0000 ug | INTRAMUSCULAR | Status: DC | PRN

## 2018-05-17 MED ORDER — DEXAMETHASONE SODIUM PHOSPHATE 10 MG/ML IJ SOLN
INTRAMUSCULAR | Status: DC | PRN
  Administered 2018-05-17: 5 mg via INTRAVENOUS

## 2018-05-17 MED ORDER — CEFAZOLIN SODIUM-DEXTROSE 2-4 GM/100ML-% IV SOLN
INTRAVENOUS | Status: AC
  Filled 2018-05-17: qty 100

## 2018-05-17 MED ORDER — EPHEDRINE SULFATE-NACL 50-0.9 MG/10ML-% IV SOSY
PREFILLED_SYRINGE | INTRAVENOUS | Status: DC | PRN
  Administered 2018-05-17: 15 mg via INTRAVENOUS
  Administered 2018-05-17 (×2): 10 mg via INTRAVENOUS

## 2018-05-17 MED ORDER — MIDAZOLAM HCL 5 MG/5ML IJ SOLN
INTRAMUSCULAR | Status: DC | PRN
  Administered 2018-05-17: 2 mg via INTRAVENOUS

## 2018-05-17 MED ORDER — SODIUM CHLORIDE 0.9 % IR SOLN
Status: DC | PRN
  Administered 2018-05-17: 3000 mL

## 2018-05-17 MED ORDER — PROPOFOL 10 MG/ML IV BOLUS
INTRAVENOUS | Status: DC | PRN
  Administered 2018-05-17: 150 mg via INTRAVENOUS

## 2018-05-17 MED ORDER — ONDANSETRON HCL 4 MG/2ML IJ SOLN
INTRAMUSCULAR | Status: DC | PRN
  Administered 2018-05-17: 4 mg via INTRAVENOUS

## 2018-05-17 MED ORDER — PROPOFOL 10 MG/ML IV BOLUS
INTRAVENOUS | Status: AC
  Filled 2018-05-17: qty 20

## 2018-05-17 MED ORDER — FENTANYL CITRATE (PF) 100 MCG/2ML IJ SOLN
INTRAMUSCULAR | Status: AC
  Filled 2018-05-17: qty 2

## 2018-05-17 MED ORDER — FENTANYL CITRATE (PF) 100 MCG/2ML IJ SOLN
INTRAMUSCULAR | Status: DC | PRN
  Administered 2018-05-17: 50 ug via INTRAVENOUS

## 2018-05-17 SURGICAL SUPPLY — 14 items
BAG URO CATCHER STRL LF (MISCELLANEOUS) ×3 IMPLANT
CATH INTERMIT  6FR 70CM (CATHETERS) IMPLANT
CLOTH BEACON ORANGE TIMEOUT ST (SAFETY) ×3 IMPLANT
COVER WAND RF STERILE (DRAPES) IMPLANT
GLOVE BIO SURGEON STRL SZ 6.5 (GLOVE) ×2 IMPLANT
GLOVE BIO SURGEONS STRL SZ 6.5 (GLOVE) ×1
GLOVE BIOGEL M STRL SZ7.5 (GLOVE) ×3 IMPLANT
GOWN STRL REUS W/TWL LRG LVL3 (GOWN DISPOSABLE) ×6 IMPLANT
GUIDEWIRE STR DUAL SENSOR (WIRE) IMPLANT
LEGGING LITHOTOMY PAIR STRL (DRAPES) ×3 IMPLANT
MANIFOLD NEPTUNE II (INSTRUMENTS) ×3 IMPLANT
PACK CYSTO (CUSTOM PROCEDURE TRAY) ×3 IMPLANT
TUBING CONNECTING 10 (TUBING) ×2 IMPLANT
TUBING CONNECTING 10' (TUBING) ×1

## 2018-05-17 NOTE — Anesthesia Preprocedure Evaluation (Addendum)
Anesthesia Evaluation  Patient identified by MRN, date of birth, ID band Patient awake    Reviewed: Allergy & Precautions, NPO status , Patient's Chart, lab work & pertinent test results  Airway Mallampati: II  TM Distance: >3 FB Neck ROM: Full    Dental no notable dental hx. (+) Edentulous Upper, Missing,    Pulmonary former smoker, PE (on xarelto)   Pulmonary exam normal breath sounds clear to auscultation       Cardiovascular hypertension, negative cardio ROS Normal cardiovascular exam Rhythm:Regular Rate:Normal  TTE 11/2017 - Normal LV EF. Elevated right sided filling pressures. Mildly dilated RV with normal systolic function. No valvular abnormalities   Neuro/Psych PSYCHIATRIC DISORDERS Depression TBI 2010 negative neurological ROS     GI/Hepatic negative GI ROS, Neg liver ROS,   Endo/Other  negative endocrine ROS  Renal/GU negative Renal ROS  negative genitourinary   Musculoskeletal negative musculoskeletal ROS (+)   Abdominal   Peds  Hematology negative hematology ROS (+)   Anesthesia Other Findings Bladder calculus, h/o prostate CA On xarelto 2/2 PE (last dose 12/29 morning)  Reproductive/Obstetrics                           Anesthesia Physical Anesthesia Plan  ASA: III  Anesthesia Plan: General   Post-op Pain Management:    Induction: Intravenous  PONV Risk Score and Plan: 1 and Ondansetron, Dexamethasone and Treatment may vary due to age or medical condition  Airway Management Planned: LMA  Additional Equipment:   Intra-op Plan:   Post-operative Plan: Extubation in OR  Informed Consent: I have reviewed the patients History and Physical, chart, labs and discussed the procedure including the risks, benefits and alternatives for the proposed anesthesia with the patient or authorized representative who has indicated his/her understanding and acceptance.   Dental advisory  given  Plan Discussed with: CRNA  Anesthesia Plan Comments:         Anesthesia Quick Evaluation

## 2018-05-17 NOTE — Interval H&P Note (Signed)
History and Physical Interval Note:  05/17/2018 10:00 AM  Druscilla Brownie  has presented today for surgery, with the diagnosis of BLADDER CALCULUS  The various methods of treatment have been discussed with the patient and family. After consideration of risks, benefits and other options for treatment, the patient has consented to  Procedure(s) with comments: CYSTOSCOPY/ REMOVAL OF BLADDER CALCULUS OR FOREIGN BODY (N/A) - ONLY NEEDS 45 MIN as a surgical intervention .  The patient's history has been reviewed, patient examined, no change in status, stable for surgery.  I have reviewed the patient's chart and labs.  Questions were answered to the patient's satisfaction.     Neeraj Housand,LES

## 2018-05-17 NOTE — H&P (Signed)
Office Visit Report     04/28/2018   --------------------------------------------------------------------------------   Fred [name]. Bard  MRN: [id number]  PRIMARY CARE:  Audree Camel. [name], MD  DOB: [birth year], 67 year old Male  REFERRING:  Robert A. [name], MD  SSN: [id number]  PROVIDER:  [name], M.D.    LOCATION:  [facility] [phone]   --------------------------------------------------------------------------------   CC/HPI: 1. Prostate cancer  2. Hematuria   He returns today for half years out from his radical prostatectomy. He continues to maintain stable continence. He continues to use 1 pad per day although this has not become more bothersome to him. He has continued to use a vacuum erection device prn for treatment of erectile dysfunction.   He did start on Xarelto for treatment of a bilateral pulmonary embolus in July. Since then, he has noted intermittent gross hematuria. He has denied any dysuria or change in his voiding symptoms otherwise. He does have a history of tobacco use but quit smoking over 20 years ago.     ALLERGIES: Lyrica CAPS NSAIDs Percodan    MEDICATIONS: Donepezil Hcl 10 mg tablet  Duloxetine Hcl 60 mg capsule,delayed release Oral  Lisinopril-Hydrochlorothiazide 20 mg-12.5 mg tablet Oral  Xarelto     GU PSH: Laparoscopy; Lymphadenectomy - 2015 Prostate Needle Biopsy - 2015 Pyeloplasty (open) - 2015 Robotic Radical Prostatectomy - 2015      PSH Notes: Laparoscopy With Bilateral Total Pelvic Lymphadenectomy, Prostatect Retropubic Radical W/ Nerve Sparing Laparoscopic, Biopsy Of The Prostate Needle, Pyeloplasty, Neck Surgery   NON-GU PSH: None   GU PMH: Prostate Cancer (Stable), Previously NED via serial PSA measurements post prostatectomy. 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   Diagnosis: pT2c N0 Mx, Gleason 3+4=7 adenocarcinoma with negative surgical margins  Pretreatment PSA: 5.4  Pretreatment SHIM: 15     NON-GU PMH: Anxiety, Anxiety - 2015 Arthritis Depression GERD Glaucoma Hypercholesterolemia Hypertension    FAMILY HISTORY: Colon Cancer - Runs In Family Kidney Cancer - Runs In Family Prostate Cancer - Runs In Family   SOCIAL HISTORY: Marital Status: Married Preferred Language: English; Ethnicity: Not Hispanic Or Latino; Race: White Current Smoking Status: Patient does not smoke anymore.   Tobacco Use Assessment Completed: Used Tobacco in last 30 days?     Notes: Alcohol use, Married, Former smoker   REVIEW OF SYSTEMS:    GU Review Male:   Patient denies frequent urination, hard to postpone urination, burning/ pain with urination, get up at night to urinate, leakage of urine, stream starts and stops, trouble starting your streams, and have to strain to urinate .  Gastrointestinal (Lower):   Patient denies diarrhea and constipation.  Gastrointestinal (Upper):   Patient denies nausea and vomiting.  Constitutional:   Patient denies fever, night sweats, weight loss, and fatigue.  Skin:   Patient denies skin rash/ lesion and itching.  Eyes:   Patient denies blurred vision and double vision.  Ears/ Nose/ Throat:   Patient denies sore throat and sinus problems.  Hematologic/Lymphatic:   Patient denies swollen glands and easy  bruising.  Cardiovascular:   Patient denies leg swelling and chest pains.  Respiratory:   Patient denies cough and shortness of breath.  Endocrine:   Patient denies excessive thirst.  Musculoskeletal:   Patient denies  joint pain and back pain.  Neurological:   Patient denies headaches and dizziness.  Psychologic:   Patient denies depression and anxiety.   VITAL SIGNS:      04/28/2018 11:23 AM  Weight 174 lb / 78.93 kg  Height 68 in / 172.72 cm  BP 145/91 mmHg  Pulse 61 /min  BMI 26.5 kg/m   GU PHYSICAL EXAMINATION:    Urethral Meatus: Normal size. No lesion, no wart, no discharge, no polyp. Normal location.   MULTI-SYSTEM PHYSICAL EXAMINATION:    Constitutional: Well-nourished. No physical deformities. Normally developed. Good grooming.     PAST DATA REVIEWED:  Source Of History:  Patient  Lab Test Review:   PSA  Urine Test Review:   Urinalysis   04/14/18 04/07/17 08/12/16 09/12/15 02/24/15 08/03/14 02/02/14 09/28/13  PSA  Total PSA <0.015 ng/mL <0.015 ng/mL < 0.015 ng/dl <0.01  <0.01  <0.01  0.03  <0.01     PROCEDURES:         Flexible Cystoscopy - 52000  Indication: Hematuria Risks, benefits, and potential complications of the procedure were discussed with the patient including infection, bleeding, voiding discomfort, urinary retention, fever, chills, sepsis, and others. All questions were answered. Informed consent was obtained. Sterile technique and intraurethral analgesia were used.  Meatus:  Normal size. Normal location. Normal condition.  Urethra:  No strictures.  External Sphincter:  Normal.  Verumontanum:  Verumontanum Surgically Absent.  Prostate:  Prostate Surgically Absent.  Bladder Neck:  At his bladder neck, he was noted to have an obstructing calculus. The did appear to be a small staple in the anterior urethra at the site of the anastomosis and possibly on the stone itself. I was able to dislodge the stone and push it back into the bladder.  Ureteral Orifices:  Normal location. Normal size. Normal shape. Effluxed clear urine.  Bladder:  No trabeculation. No tumors. Normal mucosa. No stones.      Chaperone: Alleen Borne The procedure was well-tolerated and without complications.  Instructions were given to call the office immediately if questions or problems.         Urinalysis w/Scope Dipstick Dipstick Cont'd Micro  Color: Yellow Bilirubin: Neg mg/dL WBC/hpf: 0 - 5/hpf  Appearance: Clear Ketones: Neg mg/dL RBC/hpf: 20 - 40/hpf  Specific Gravity: 1.030 Blood: 3+ ery/uL Bacteria: NS (Not Seen)  pH: 5.5 Protein: 1+ mg/dL Cystals: Ca Oxalate  Glucose: Neg mg/dL Urobilinogen: 1.0 mg/dL Casts: Hyaline    Nitrites: Neg Trichomonas: Not Present    Leukocyte Esterase: Neg leu/uL Mucous: Present      Epithelial Cells: 0 - 5/hpf      Yeast: NS (Not Seen)      Sperm: Not Present    ASSESSMENT:      ICD-10 Details  1 GU:   Prostate Cancer - C61   2   Stress Incontinence - N39.3   3   ED due to arterial insufficiency - N52.01   4   Gross hematuria - R31.0    PLAN:           Orders Labs BUN/Creatinine  X-Ray Notes: ...          Schedule Labs: 1 Year - PSA    1 Year - Urinalysis  X-Rays: 2 Weeks - C.T. Hematuria With  and Without I.V. Contrast  Return Visit/Planned Activity: Other See Visit Notes             Note: Will call to schedule surgery          Document Letter(s):  Created for Patient: Clinical Summary         Notes:   1. Prostate cancer: His PSA is undetectable. He will continue annual surveillance.   2. Incontinence: This is stable. This might possibly improve following treatment of his stone.   3. Erectile dysfunction: Continue use of his vacuum erection device prn.   4. Hematuria: He will undergo a hematuria protocol CT scan to complete his evaluation. However, he does have a calculus that clearly would explain his hematuria. I have recommended that he proceed with an outpatient surgery for cystoscopy and removal of his bladder stone and any potential foreign body at the site of his anastomosis. We discussed this procedure in detail including the potential risks, complications, and the expected recovery process. He gives informed consent. This  will be scheduled for the near future.   Cc: Dr. Maury Dus         Next Appointment:      Next Appointment: 05/07/2018 08:45 AM    Appointment Type: CT Scan    Location: [facility] [phone]    Provider: CT CT    Reason for Visit: CT hematuria w/wo contrast / Atlee Villers/ hematuria      * Signed by [name], M.D. on 04/28/18 at 5:38 PM (EST)*

## 2018-05-17 NOTE — Transfer of Care (Signed)
Immediate Anesthesia Transfer of Care Note  Patient: Fred [name]  Procedure(s) Performed: CYSTOSCOPY/ REMOVAL OF BLADDER CALCULUS (N/A )  Patient Location: PACU  Anesthesia Type:General  Level of Consciousness: sedated, patient cooperative and responds to stimulation  Airway & Oxygen Therapy: Patient Spontanous Breathing and Patient connected to face mask oxygen  Post-op Assessment: Report given to RN and Post -op Vital signs reviewed and stable  Post vital signs: Reviewed and stable  Last Vitals:  Vitals Value Taken Time  BP 149/108 05/17/2018 10:36 AM  Temp    Pulse 62 05/17/2018 10:37 AM  Resp 25 05/17/2018 10:37 AM  SpO2 96 % 05/17/2018 10:37 AM  Vitals shown include unvalidated device data.  Last Pain:  Vitals:   05/17/18 0821  PainSc: 0-No pain      Patients Stated Pain Goal: 4 (72/62/03 5597)  Complications: No apparent anesthesia complications

## 2018-05-17 NOTE — Anesthesia Procedure Notes (Signed)
Procedure Name: LMA Insertion Performed by: Rachana Malesky J, CRNA Pre-anesthesia Checklist: Patient identified, Emergency Drugs available, Suction available, Patient being monitored and Timeout performed Patient Re-evaluated:Patient Re-evaluated prior to induction Oxygen Delivery Method: Circle system utilized Preoxygenation: Pre-oxygenation with 100% oxygen Induction Type: IV induction Ventilation: Mask ventilation without difficulty LMA: LMA inserted LMA Size: 4.0 Number of attempts: 1 Placement Confirmation: positive ETCO2,  CO2 detector and breath sounds checked- equal and bilateral Tube secured with: Tape Dental Injury: Teeth and Oropharynx as per pre-operative assessment        

## 2018-05-17 NOTE — Discharge Instructions (Addendum)
1. You may see some blood in the urine and may have some burning with urination for 48-72 hours. You also may notice that you have to urinate more frequently or urgently after your procedure which is normal.  2. You should call should you develop an inability urinate, fever > 101, persistent nausea and vomiting that prevents you from eating or drinking to stay hydrated.   Cystoscopy  Cystoscopy is a procedure that is used to help diagnose and sometimes treat conditions that affect that lower urinary tract. The lower urinary tract includes the bladder and the tube that drains urine from the bladder out of the body (urethra). Cystoscopy is performed with a thin, tube-shaped instrument with a light and camera at the end (cystoscope). The cystoscope may be hard (rigid) or flexible, depending on the goal of the procedure.The cystoscope is inserted through the urethra, into the bladder. Cystoscopy may be recommended if you have:  Urinary tractinfections that keep coming back (recurring).  Blood in the urine (hematuria).  Loss of bladder control (urinary incontinence) or an overactive bladder.  Unusual cells found in a urine sample.  A blockage in the urethra.  Painful urination.  An abnormality in the bladder found during an intravenous pyelogram (IVP) or CT scan. Cystoscopy may also be done to remove a sample of tissue to be examined under a microscope (biopsy). Tell a health care provider about:  Any allergies you have.  All medicines you are taking, including vitamins, herbs, eye drops, creams, and over-the-counter medicines.  Any problems you or family members have had with anesthetic medicines.  Any blood disorders you have.  Any surgeries you have had.  Any medical conditions you have.  Whether you are pregnant or may be pregnant. What are the risks? Generally, this is a safe procedure. However, problems may occur, including:  Infection.  Bleeding.  Allergic reactions to  medicines.  Damage to other structures or organs. What happens before the procedure?  Ask your health care provider about: ? Changing or stopping your regular medicines. This is especially important if you are taking diabetes medicines or blood thinners. ? Taking medicines such as aspirin and ibuprofen. These medicines can thin your blood. Do not take these medicines before your procedure if your health care provider instructs you not to.  Follow instructions from your health care provider about eating or drinking restrictions.  You may be given antibiotic medicine to help prevent infection.  You may have an exam or testing, such as X-rays of the bladder, urethra, or kidneys.  You may have urine tests to check for signs of infection.  Plan to have someone take you home after the procedure. What happens during the procedure?  To reduce your risk of infection,your health care team will wash or sanitize their hands.  You will be given one or more of the following: ? A medicine to help you relax (sedative). ? A medicine to numb the area (local anesthetic).  The area around the opening of your urethra will be cleaned.  The cystoscope will be passed through your urethra into your bladder.  Germ-free (sterile)fluid will flow through the cystoscope to fill your bladder. The fluid will stretch your bladder so that your surgeon can clearly examine your bladder walls.  The cystoscope will be removed and your bladder will be emptied. The procedure may vary among health care providers and hospitals. What happens after the procedure?  You may have some soreness or pain in your abdomen and urethra. Medicines  will be available to help you.  You may have some blood in your urine.  Do not drive for 24 hours if you received a sedative. This information is not intended to replace advice given to you by your health care provider. Make sure you discuss any questions you have with your health  care provider. Document Released: 05/02/2000 Document Revised: 02/13/2017 Document Reviewed: 03/22/2015 Elsevier Interactive Patient Education  2019 [employer].

## 2018-05-17 NOTE — Anesthesia Postprocedure Evaluation (Signed)
Anesthesia Post Note  Patient: Fred [name]  Procedure(s) Performed: CYSTOSCOPY/ REMOVAL OF BLADDER CALCULUS (N/A )     Patient location during evaluation: PACU Anesthesia Type: General Level of consciousness: awake and alert Pain management: pain level controlled Vital Signs Assessment: post-procedure vital signs reviewed and stable Respiratory status: spontaneous breathing, nonlabored ventilation, respiratory function stable and patient connected to nasal cannula oxygen Cardiovascular status: blood pressure returned to baseline and stable Postop Assessment: no apparent nausea or vomiting Anesthetic complications: no    Last Vitals:  Vitals:   05/17/18 1100 05/17/18 1120  BP: (!) 131/95 (!) 139/103  Pulse: 61   Resp: 12 16  Temp: 36.6 C 36.5 C  SpO2: 95% 95%    Last Pain:  Vitals:   05/17/18 1120  PainSc: 0-No pain                 Autumne Kallio L Fergus Throne

## 2018-05-17 NOTE — Op Note (Signed)
Preoperative diagnosis:  Urethral foreign body  Postoperative diagnosis:  Urethral foreign body  Procedure:  Cystoscopy and removal of urethral foreign body  Surgeon: Dr. Roxy Horseman, Brooke Bonito.  Anesthesia:  General  Complications: None  Indication: Fred [name] is a 67 year old gentleman who was recently found to have microscopic hematuria.  On evaluation in the office, he underwent cystoscopy was noted to have a large calculus at his bladder neck.  This was able to be manipulated but not removed.  He subsequently passed this stone.  However, it was felt that he likely had an underlying foreign body within the urethra as the nidus for his stone formation.  He presents today for further evaluation and removal.  The potential risks, complications, and the expected recovery process associated with this procedure were discussed and he gave informed consent.  Description of procedure: The patient was taken to the operating room and a general anesthetic was administered.  He was given preoperative antibiotics, placed in the dorsolithotomy position, and prepped and draped in the usual sterile fashion.  Next, a preoperative timeout was performed.  Cystourethroscopy was then performed with the 30 and 70 degree lens and a 22 French cystoscope sheath.  The bladder revealed no evidence of any abnormalities including no tumors, stones, or other mucosal pathology.  The ureteral orifices were in their expected anatomic location.  Withdrawing back to the bladder neck, a small remnant of stone was seen anteriorly along with an underlying small staple that appeared to have eroded into the urethral mucosa.  This was able to be removed with a flexible grasper intact.  Further examination of the bladder neck was then performed with both a 30 degree and 70 degree lens.  No remaining foreign bodies or other abnormalities were identified.  The patient's bladder was emptied and the procedure was ended.  He tolerated the  procedure well without complications.  He was able to be awakened and transferred to the recovery unit in satisfactory condition.

## 2018-05-18 ENCOUNTER — Encounter (HOSPITAL_COMMUNITY): Payer: Self-pay | Admitting: Urology

## 2018-06-01 ENCOUNTER — Encounter: Payer: Medicare Other | Attending: Registered Nurse | Admitting: Registered Nurse

## 2018-06-01 ENCOUNTER — Encounter: Payer: Self-pay | Admitting: Registered Nurse

## 2018-06-01 VITALS — BP 130/85 | HR 60 | Ht 68.0 in | Wt 184.0 lb

## 2018-06-01 DIAGNOSIS — Z981 Arthrodesis status: Secondary | ICD-10-CM | POA: Diagnosis not present

## 2018-06-01 DIAGNOSIS — G894 Chronic pain syndrome: Secondary | ICD-10-CM

## 2018-06-01 DIAGNOSIS — M2550 Pain in unspecified joint: Secondary | ICD-10-CM

## 2018-06-01 DIAGNOSIS — M25511 Pain in right shoulder: Secondary | ICD-10-CM

## 2018-06-01 DIAGNOSIS — M961 Postlaminectomy syndrome, not elsewhere classified: Secondary | ICD-10-CM | POA: Diagnosis not present

## 2018-06-01 DIAGNOSIS — I10 Essential (primary) hypertension: Secondary | ICD-10-CM | POA: Insufficient documentation

## 2018-06-01 DIAGNOSIS — M545 Low back pain: Secondary | ICD-10-CM

## 2018-06-01 DIAGNOSIS — Z8249 Family history of ischemic heart disease and other diseases of the circulatory system: Secondary | ICD-10-CM | POA: Diagnosis not present

## 2018-06-01 DIAGNOSIS — H9190 Unspecified hearing loss, unspecified ear: Secondary | ICD-10-CM | POA: Insufficient documentation

## 2018-06-01 DIAGNOSIS — M4802 Spinal stenosis, cervical region: Secondary | ICD-10-CM

## 2018-06-01 DIAGNOSIS — M546 Pain in thoracic spine: Secondary | ICD-10-CM

## 2018-06-01 DIAGNOSIS — Z79899 Other long term (current) drug therapy: Secondary | ICD-10-CM

## 2018-06-01 DIAGNOSIS — M7918 Myalgia, other site: Secondary | ICD-10-CM

## 2018-06-01 DIAGNOSIS — E785 Hyperlipidemia, unspecified: Secondary | ICD-10-CM | POA: Diagnosis not present

## 2018-06-01 DIAGNOSIS — G8929 Other chronic pain: Secondary | ICD-10-CM | POA: Diagnosis present

## 2018-06-01 DIAGNOSIS — Z87442 Personal history of urinary calculi: Secondary | ICD-10-CM | POA: Insufficient documentation

## 2018-06-01 DIAGNOSIS — Z841 Family history of disorders of kidney and ureter: Secondary | ICD-10-CM | POA: Diagnosis not present

## 2018-06-01 DIAGNOSIS — M25512 Pain in left shoulder: Secondary | ICD-10-CM | POA: Diagnosis not present

## 2018-06-01 DIAGNOSIS — Z87891 Personal history of nicotine dependence: Secondary | ICD-10-CM | POA: Insufficient documentation

## 2018-06-01 DIAGNOSIS — Z823 Family history of stroke: Secondary | ICD-10-CM | POA: Insufficient documentation

## 2018-06-01 DIAGNOSIS — Z5181 Encounter for therapeutic drug level monitoring: Secondary | ICD-10-CM

## 2018-06-01 DIAGNOSIS — M542 Cervicalgia: Secondary | ICD-10-CM | POA: Diagnosis not present

## 2018-06-01 MED ORDER — OXYCODONE HCL 5 MG PO TABS: 5.0000 mg | ORAL_TABLET | Freq: Three times a day (TID) | ORAL | 0 refills | Status: DC | PRN

## 2018-06-01 NOTE — Progress Notes (Signed)
Subjective:    Patient ID: Fred [name], male    DOB: Sep 15, 1950, 68 y.o.   MRN: [id number]  HPI: Fred [name] is a 68 y.o. male who returns for follow up appointment for chronic pain and medication refill. He states his pain is located in his neck, bilateral shoulders and upper-lower back pain, He rates his pain 4. His. current exercise regime is walking and performing stretching exercises. Also reports they  ( he and his wife) will be joining the [employer] this month.   Fred [name] had a Cystoscopy on 05/17/2018 by Dr. [name].   Fred [name] had a pill container in his  Pocket, he had 13 tablets of Oxycodone. He was instructed to keep all medication in his pill container, he verbalizes understanding.   Fred [name] Morphine equivalent is 22.50MME.  Last Oral Swab was Performed on 10/01/2017, it was consistent.   Wife in room all questions answered.    Pain Inventory Average Pain 4 Pain Right Now 4 My pain is constant, sharp, stabbing and aching  In the last 24 hours, has pain interfered with the following? General activity 4 Relation with others 4 Enjoyment of life 4 What TIME of day is your pain at its worst? evening Sleep (in general) Good  Pain is worse with: sitting and inactivity Pain improves with: therapy/exercise and medication Relief from Meds: 4  Mobility walk without assistance ability to climb steps?  yes do you drive?  yes  Function retired  Neuro/Psych numbness tremor tingling  Prior Studies Any changes since last visit?  no  Physicians involved in your care Any changes since last visit?  no   Family History  Problem Relation Age of Onset  . Stroke Mother   . Dementia Mother   . Heart disease Father   . Stroke Father        heat stroke  . Kidney disease Brother   . Dementia Brother    Social History   Socioeconomic History  . Marital status: Married    Spouse name: Not on file  . Number of children: 6  . Years of education: 12+    . Highest education level: Not on file  Occupational History  . Occupation: Retired  Scientific laboratory technician  . Financial resource strain: Not on file  . Food insecurity:    Worry: Not on file    Inability: Not on file  . Transportation needs:    Medical: Not on file    Non-medical: Not on file  Tobacco Use  . Smoking status: Former Smoker    Packs/day: 1.00    Years: 30.00    Pack years: 30.00    Types: Cigarettes    Last attempt to quit: 05/25/2001    Years since quitting: 17.0  . Smokeless tobacco: Former Systems developer    Types: Chew, Snuff    Quit date: 09/22/2001  Substance and Sexual Activity  . Alcohol use: Not Currently  . Drug use: No  . Sexual activity: Not on file  Lifestyle  . Physical activity:    Days per week: Not on file    Minutes per session: Not on file  . Stress: Not on file  Relationships  . Social connections:    Talks on phone: Not on file    Gets together: Not on file    Attends religious service: Not on file    Active member of club or organization: Not on file    Attends meetings of clubs or organizations:  Not on file    Relationship status: Not on file  Other Topics Concern  . Not on file  Social History Narrative   Lives with wife   Caffeine use: Coffee daily (1-2 per day)   Some soda   Right handed    Past Surgical History:  Procedure Laterality Date  . ANTERIOR CERVICAL DECOMP/DISCECTOMY FUSION  09-19-2010    dr Ronnald Ramp  @[facility]   . COLONOSCOPY W/ POLYPECTOMY  last one 03/ 2019  . CYSTOSCOPY N/A 05/17/2018   Procedure: CYSTOSCOPY/ REMOVAL OF BLADDER CALCULUS;  Surgeon: [name], MD;  Location: [facility];  Service: Urology;  Laterality: N/A;  ONLY NEEDS 45 MIN  . FINGER SURGERY  1970s   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 RIGHT MIDDLE FINGER  . IMPLANTATION BONE ANCHORED HEARING AID Right 2011    @[facility]    "no longer have this"  . LYMPHADENECTOMY Bilateral 08/04/2013   Procedure: LYMPHADENECTOMY;  Surgeon: Dutch Gray, MD;   Location: [facility];  Service: Urology;  Laterality: Bilateral;  . PROSTATE SURGERY    . ROBOT ASSISTED LAPAROSCOPIC RADICAL PROSTATECTOMY N/A 08/04/2013   Procedure: ROBOTIC ASSISTED LAPAROSCOPIC RADICAL PROSTATECTOMY LEVEL 2;  Surgeon: Dutch Gray, MD;  Location: [facility];  Service: Urology;  Laterality: N/A;   Past Medical History:  Diagnosis Date  . Anticoagulated    xarelto  . Depression   . Gait abnormality    MILD --- DOES NOT USE CANE  . 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 History of kidney stones   . 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 Hyperlipidemia   . Hypertension   . 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 Nocturia    twice  . Prostate cancer ([place])    UROLOGIST-- DR [name]---  Stage T2a, Gleason 4+3, PSA 5.4,  s/p  radical prostatectomy 2015;   05-14-2018  per pt PSA undetectable  . Pulmonary embolism, bilateral ([place]) 12/02/2017   currently taking xarelto  . Wears dentures    upper  . Wears glasses   . Wears hearing aid in both ears    BP 130/85   Pulse 60   Ht 5\' 8"  (1.727 m)   Wt 184 lb (83.5 kg)   SpO2 96%   BMI 27.98 kg/m   Opioid Risk Score:   Fall Risk Score:  `1  Depression screen PHQ 2/9  Depression screen [facility] 2/9 04/05/2018 02/04/2018 11/03/2017 02/19/2017 12/12/2014 08/28/2014  Decreased Interest 1 0 0 0 0 2  Down, Depressed, Hopeless 1 0 0 0 0 1  PHQ - 2 Score 2 0 0 0 0 3  Altered sleeping - - - - - 1  Tired, decreased energy - - - - - 2  Change in appetite - - - - - 0  Feeling bad or failure about yourself  - - - - - 0  Trouble concentrating - - - - - 1  Moving slowly or fidgety/restless - - - - - 0  Suicidal thoughts - - - - - 0  PHQ-9 Score - - - - - 7     Review of Systems  Constitutional: Negative.   HENT: Negative.   Eyes:  Negative.   Respiratory: Negative.   Cardiovascular: Negative.  Gastrointestinal: Negative.   Endocrine: Negative.   Genitourinary: Negative.   Musculoskeletal: Positive for arthralgias, back pain and myalgias.  Skin: Negative.   Allergic/Immunologic: Negative.   Neurological: Positive for tremors and numbness.  Hematological: Negative.   Psychiatric/Behavioral: Negative.   All other systems reviewed and are negative.      Objective:   Physical Exam Vitals signs and nursing note reviewed.  Constitutional:      Appearance: Normal appearance.  Neck:     Musculoskeletal: Normal range of motion and neck supple.     Comments: Cervical Paraspinal Tenderness: C-5-C-6 Cardiovascular:     Rate and Rhythm: Normal rate and regular rhythm.     Pulses: Normal pulses.     Heart sounds: Normal heart sounds.  Pulmonary:     Effort: Pulmonary effort is normal.     Breath sounds: Normal breath sounds.  Musculoskeletal:     Comments: Normal Muscle Bulk and Muscle Testing Reveals:  Upper Extremities: Full ROM and Muscle Strength 5/5 Bilateral AC Joint Tenderness  Thoracic Paraspinal Tenderness: T-1-T-3  Lumbar Paraspinal Tenderness: L-4-L-5  Lower Extremities: Full ROM and Muscle Strength 5/5 Arises from Table with ease Narrow Based Gait   Skin:    General: Skin is warm and dry.  Neurological:     Mental Status: He is alert and oriented to person, place, and time.  Psychiatric:        Mood and Affect: Mood normal.        Behavior: Behavior normal.           Assessment & Plan:  1.Cervical Postlaminectomy: Continue HEP and Continue current medication regime. 06/01/2018. 2. Cervicalgia/ Cervical Radiculitis: Continue current medication regime. Continue to Monitor.06/01/2018 3. Chronic BilateralShoulder Pain: Continue HEP as Tolerated.06/01/2018 4. L2 compression fracture/ Chronic Bilateral Low Back Pain without Sciatica: Continue to Monitor:06/01/2018 Refilled:Oxycodone 5  mg one tablet every 8hours as needed for moderate pain. # 90. We will Continue the opioid monitoring program. This consists of regular clinic visits, examinations, urine drug screen, pill counts as well as use of [place] controlled substance reporting System. 5. Polyarthralgia: Continue to Monitor.01/14//2020 6. Muscle Spasm:Continuecurrent medication regimen with: Flexeril.06/01/2018  20 minutes of face to face patient care time was spent during this visit. All questions were encouraged and answered.  F/U in 61month

## 2018-06-30 ENCOUNTER — Telehealth: Payer: Self-pay | Admitting: Registered Nurse

## 2018-06-30 MED ORDER — OXYCODONE HCL 5 MG PO TABS: 5.0000 mg | ORAL_TABLET | Freq: Three times a day (TID) | ORAL | 0 refills | Status: DC | PRN

## 2018-06-30 NOTE — Telephone Encounter (Signed)
Fred [name] called regarding Fred [name] refill of Oxycodone. Last visit they let me know they were going to the beach returning on Saturday 07/03/2018. Oxycodone e-scribed and he has an appointment with Dr. Letta Pate on Tuesday 07/06/2018, they are awre of the above and verbalizes understanding.

## 2018-07-06 ENCOUNTER — Ambulatory Visit: Payer: Medicare Other | Admitting: Physical Medicine & Rehabilitation

## 2018-07-06 ENCOUNTER — Encounter: Payer: Medicare Other | Attending: Registered Nurse

## 2018-07-06 ENCOUNTER — Encounter: Payer: Self-pay | Admitting: Physical Medicine & Rehabilitation

## 2018-07-06 VITALS — BP 141/95 | HR 63 | Ht 68.0 in | Wt 184.0 lb

## 2018-07-06 DIAGNOSIS — M25511 Pain in right shoulder: Secondary | ICD-10-CM | POA: Insufficient documentation

## 2018-07-06 DIAGNOSIS — Z823 Family history of stroke: Secondary | ICD-10-CM | POA: Diagnosis not present

## 2018-07-06 DIAGNOSIS — E785 Hyperlipidemia, unspecified: Secondary | ICD-10-CM | POA: Diagnosis not present

## 2018-07-06 DIAGNOSIS — Z981 Arthrodesis status: Secondary | ICD-10-CM | POA: Insufficient documentation

## 2018-07-06 DIAGNOSIS — Z8249 Family history of ischemic heart disease and other diseases of the circulatory system: Secondary | ICD-10-CM | POA: Insufficient documentation

## 2018-07-06 DIAGNOSIS — M25512 Pain in left shoulder: Secondary | ICD-10-CM | POA: Insufficient documentation

## 2018-07-06 DIAGNOSIS — Z5181 Encounter for therapeutic drug level monitoring: Secondary | ICD-10-CM | POA: Diagnosis not present

## 2018-07-06 DIAGNOSIS — G8929 Other chronic pain: Secondary | ICD-10-CM | POA: Diagnosis present

## 2018-07-06 DIAGNOSIS — Z79899 Other long term (current) drug therapy: Secondary | ICD-10-CM | POA: Diagnosis not present

## 2018-07-06 DIAGNOSIS — Z841 Family history of disorders of kidney and ureter: Secondary | ICD-10-CM | POA: Insufficient documentation

## 2018-07-06 DIAGNOSIS — I10 Essential (primary) hypertension: Secondary | ICD-10-CM | POA: Insufficient documentation

## 2018-07-06 DIAGNOSIS — Z87891 Personal history of nicotine dependence: Secondary | ICD-10-CM | POA: Insufficient documentation

## 2018-07-06 DIAGNOSIS — Z87442 Personal history of urinary calculi: Secondary | ICD-10-CM | POA: Insufficient documentation

## 2018-07-06 DIAGNOSIS — H9190 Unspecified hearing loss, unspecified ear: Secondary | ICD-10-CM | POA: Insufficient documentation

## 2018-07-06 DIAGNOSIS — M542 Cervicalgia: Secondary | ICD-10-CM | POA: Diagnosis not present

## 2018-07-06 MED ORDER — OXYCODONE-ACETAMINOPHEN 7.5-325 MG PO TABS: 1.0000 | ORAL_TABLET | Freq: Three times a day (TID) | ORAL | 0 refills | Status: DC | PRN

## 2018-07-06 NOTE — Patient Instructions (Signed)
Non medication pain relief  Try ice 20min alternating with heating pad for 20 minutes every 2 hours as needed  Try TENs unit that can be puchased in a pharmacy for about 40.00, brands include Aleve or Icy Hot  Try various muscle cremes  Aspercreme or others that contain trolamine- this is anti inflammatory  Capsaicin creme which reduces Pain substance P  Lidocaine containing creme which has a numbing medicine  Methol and camphor containing creme which has a cooling effect 

## 2018-07-06 NOTE — Progress Notes (Signed)
Subjective:    Patient ID: Fred [name], male    DOB: 1951-02-11, 68 y.o.   MRN: [id number]  HPI  Doesn't feel like oxycodone is working anymore, the patient feels like the weather is causing his arthritis to bother him more.  Occasionally trembling, no certain time of day.    Still on Cymbalta 60mg  BID  Still on Aricept, Wife notes some weight gain.  We discussed that this could be contributing some of his pain particularly in the back. The patient was started on Xarelto last fall for a pulmonary embolus. Patient has neck pain that radiates into the shoulder blades. History of C4-C7 fusion Pain Inventory Average Pain 3 Pain Right Now 3 My pain is sharp, stabbing, tingling and aching  In the last 24 hours, has pain interfered with the following? General activity 3 Relation with others 4 Enjoyment of life 3 What TIME of day is your pain at its worst? daytime Sleep (in general) Fair  Pain is worse with: sitting Pain improves with: rest and medication Relief from Meds: na  Mobility walk without assistance ability to climb steps?  yes do you drive?  yes  Function disabled: date disabled .  Neuro/Psych numbness tremor tingling loss of taste or smell  Prior Studies Any changes since last visit?  no  Physicians involved in your care Any changes since last visit?  no   Family History  Problem Relation Age of Onset  . Stroke Mother   . Dementia Mother   . Heart disease Father   . Stroke Father        heat stroke  . Kidney disease Brother   . Dementia Brother    Social History   Socioeconomic History  . Marital status: Married    Spouse name: Not on file  . Number of children: 6  . Years of education: 12+  . Highest education level: Not on file  Occupational History  . Occupation: Retired  Scientific laboratory technician  . Financial resource strain: Not on file  . Food insecurity:    Worry: Not on file    Inability: Not on file  . Transportation needs:   Medical: Not on file    Non-medical: Not on file  Tobacco Use  . Smoking status: Former Smoker    Packs/day: 1.00    Years: 30.00    Pack years: 30.00    Types: Cigarettes    Last attempt to quit: 05/25/2001    Years since quitting: 17.1  . Smokeless tobacco: Former Systems developer    Types: Chew, Snuff    Quit date: 09/22/2001  Substance and Sexual Activity  . Alcohol use: Not Currently  . Drug use: No  . Sexual activity: Not on file  Lifestyle  . Physical activity:    Days per week: Not on file    Minutes per session: Not on file  . Stress: Not on file  Relationships  . Social connections:    Talks on phone: Not on file    Gets together: Not on file    Attends religious service: Not on file    Active member of club or organization: Not on file    Attends meetings of clubs or organizations: Not on file    Relationship status: Not on file  Other Topics Concern  . Not on file  Social History Narrative   Lives with wife   Caffeine use: Coffee daily (1-2 per day)   Some soda   Right handed  Past Surgical History:  Procedure Laterality Date  . ANTERIOR CERVICAL DECOMP/DISCECTOMY FUSION  09-19-2010    dr Ronnald Ramp  @[facility]   . COLONOSCOPY W/ POLYPECTOMY  last one 03/ 2019  . CYSTOSCOPY N/A 05/17/2018   Procedure: CYSTOSCOPY/ REMOVAL OF BLADDER CALCULUS;  Surgeon: [name], MD;  Location[facility];  Service: Urology;  Laterality: N/A;  ONLY NEEDS 45 MIN  . FINGER SURGERY  1970s   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 RIGHT MIDDLE FINGER  . IMPLANTATION BONE ANCHORED HEARING AID Right 2011    @[facility]    "no longer have this"  . LYMPHADENECTOMY Bilateral 08/04/2013   Procedure: LYMPHADENECTOMY;  Surgeon: Dutch Gray, MD;  Location: [facility];  Service: Urology;  Laterality: Bilateral;  . PROSTATE SURGERY    . ROBOT ASSISTED LAPAROSCOPIC RADICAL PROSTATECTOMY N/A 08/04/2013   Procedure: ROBOTIC ASSISTED LAPAROSCOPIC RADICAL PROSTATECTOMY LEVEL 2;  Surgeon: Dutch Gray, MD;   Location[facility];  Service: Urology;  Laterality: N/A;   Past Medical History:  Diagnosis Date  . Anticoagulated    xarelto  . Depression   . Gait abnormality    MILD --- DOES NOT USE CANE  . 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 History of kidney stones   . 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 Hyperlipidemia   . Hypertension   . 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 Nocturia    twice  . Prostate cancer ([place])    UROLOGIST-- DR [name]---  Stage T2a, Gleason 4+3, PSA 5.4,  s/p  radical prostatectomy 2015;   05-14-2018  per pt PSA undetectable  . Pulmonary embolism, bilateral ([place]) 12/02/2017   currently taking xarelto  . Wears dentures    upper  . Wears glasses   . Wears hearing aid in both ears    BP (!) 141/95   Pulse 63   Ht 5\' 8"  (1.727 m)   Wt 184 lb (83.5 kg)   SpO2 97%   BMI 27.98 kg/m   Opioid Risk Score:   Fall Risk Score:  `1  Depression screen PHQ 2/9  Depression screen [facility] 2/9 04/05/2018 02/04/2018 11/03/2017 02/19/2017 12/12/2014 08/28/2014  Decreased Interest 1 0 0 0 0 2  Down, Depressed, Hopeless 1 0 0 0 0 1  PHQ - 2 Score 2 0 0 0 0 3  Altered sleeping - - - - - 1  Tired, decreased energy - - - - - 2  Change in appetite - - - - - 0  Feeling bad or failure about yourself  - - - - - 0  Trouble concentrating - - - - - 1  Moving slowly or fidgety/restless - - - - - 0  Suicidal thoughts - - - - - 0  PHQ-9 Score - - - - - 7    Review of Systems  Constitutional: Negative.   HENT: Negative.   Eyes: Negative.   Respiratory: Negative.   Cardiovascular: Negative.   Gastrointestinal: Negative.   Endocrine: Negative.   Genitourinary: Negative.   Musculoskeletal: Positive for arthralgias, back pain and myalgias.  Skin: Negative.   Allergic/Immunologic:  Negative.   Neurological: Positive for tremors and numbness.  Hematological: Negative.   Psychiatric/Behavioral: Negative.   All other systems  reviewed and are negative.      Objective:   Physical Exam Vitals signs and nursing note reviewed.  Constitutional:      Appearance: Normal appearance.  HENT:     Head: Normocephalic and atraumatic.     Nose: Nose normal.     Mouth/Throat:     Mouth: Mucous membranes are moist.  Eyes:     Extraocular Movements: Extraocular movements intact.     Pupils: Pupils are equal, round, and reactive to light.  Musculoskeletal:     Comments: The patient has normal gait pattern Cervical spine has no tenderness palpation Thoracic spine has no tenderness palpation Lumbar spine has mild tenderness in the lumbosacral junction Patient has mildly diminished hip internal and external rotation.  Shoulder pain with 90 degrees of abduction bilaterally.  Also has some shoulder pain over the posterior subacromial area left greater than right side. No tenderness over the acromioclavicular joints.   Skin:    General: Skin is warm and dry.  Neurological:     Mental Status: He is alert.     Gait: Gait normal.     Deep Tendon Reflexes:     Reflex Scores:      Patellar reflexes are 1+ on the right side and 1+ on the left side.      Achilles reflexes are 1+ on the right side and 1+ on the left side.    Comments: Motor strength is 5/5 bilateral deltoid bicep tricep grip hip flexor knee extensor ankle dorsiflexor            Assessment & Plan:  1.  1.  History of cervical stenosis with cervical postlaminectomy syndrome status post C4-C7 ACDF in 2013 We discussed that narcotic analgesics over the long-term usually do not produce more than a 30% pain relief. We discussed that we may try at least during winter months going up on his oxycodone from 5 mg 3 times per day  to 7.5 mg 3 times per day The patient's pain is in 3-4 out of 10 range.  If he remains at that  same pain level or worsens, I would go back to his 5 mg dose since that would indicate no additional improvement with the higher narcotic dosing. We also discussed the possibility that the higher dose of medication may contribute to memory problems.  He is on Aricept We also discussed other nonnarcotic treatments including use of TENS unit heating pad, heat alternating with ice, we discussed 3 different types of topical analgesics. We also discussed that he is less active and likely the sedentary lifestyle is also causing some problem.  Have encouraged some more walking. Nurse practitioner visit 1 month Urine drug screen to be performed today

## 2018-07-07 ENCOUNTER — Telehealth: Payer: Self-pay

## 2018-07-07 NOTE — Telephone Encounter (Signed)
Patient husband called stating patient was prescribed Oxycodone 7.5 mg yesterday copay $47 and the Oxycodone 5 mg that patient was on is copay $8. Asking if amount of Oxycodone 5 mg be increased instead of taking Oxycodone 7.5 mg

## 2018-07-07 NOTE — Telephone Encounter (Signed)
Ok to change to Oxy IR 5mg  #120 take QID

## 2018-07-08 MED ORDER — OXYCODONE HCL 5 MG PO TABS: 5.0000 mg | ORAL_TABLET | Freq: Four times a day (QID) | ORAL | 0 refills | Status: DC | PRN

## 2018-07-08 NOTE — Telephone Encounter (Signed)
Contacted patients wife. She verbalized understanding.  Will call back when patient gets close to running out do to increase in frequency.

## 2018-07-29 ENCOUNTER — Institutional Professional Consult (permissible substitution): Payer: Medicare Other | Admitting: Pulmonary Disease

## 2018-08-09 ENCOUNTER — Encounter: Payer: Medicare Other | Admitting: Registered Nurse

## 2018-08-13 ENCOUNTER — Ambulatory Visit: Payer: Medicare Other | Admitting: Pulmonary Disease

## 2018-08-13 ENCOUNTER — Encounter: Payer: Self-pay | Admitting: Pulmonary Disease

## 2018-08-13 ENCOUNTER — Other Ambulatory Visit: Payer: Self-pay

## 2018-08-13 VITALS — BP 124/80 | HR 67 | Ht 68.0 in | Wt 184.0 lb

## 2018-08-13 DIAGNOSIS — R0602 Shortness of breath: Secondary | ICD-10-CM | POA: Diagnosis not present

## 2018-08-13 DIAGNOSIS — R0609 Other forms of dyspnea: Secondary | ICD-10-CM | POA: Diagnosis not present

## 2018-08-13 DIAGNOSIS — I2699 Other pulmonary embolism without acute cor pulmonale: Secondary | ICD-10-CM | POA: Diagnosis not present

## 2018-08-13 MED ORDER — ALBUTEROL SULFATE HFA 108 (90 BASE) MCG/ACT IN AERS: 2.0000 | INHALATION_SPRAY | Freq: Four times a day (QID) | RESPIRATORY_TRACT | 0 refills | Status: DC | PRN

## 2018-08-13 NOTE — Assessment & Plan Note (Signed)
This was unprovoked 11/2017 and he has completed anticoagulation for 6 months.. We discussed that he is at high risk than normal population for recurrence but this risk is still low and okay to stay off anticoagulation for now

## 2018-08-13 NOTE — Progress Notes (Signed)
Subjective:    Patient ID: Fred [name], male    DOB: [birth year], 68 y.o.   MRN: [id number]  HPI Chief Complaint  Patient presents with  . 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 Has developed some SOB over the past few years.     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 He worked in the [employer] [place] for 30 years before he retired in 2000.  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  He remembers seeing a pulmonologist in 1990 and was checked for: Rock dust exposure and does not remember being told of any lung problem.  He has developed dyspnea on exertion for the past several years but increased over the past year.  He feels dyspneic and walking on level ground for 10 minutes.  This is relieved by resting He denies cough or wheezing or recurrent chest colds. He denies any chest pain, orthopnea or paroxysmal nocturnal dyspnea. There is history of unprovoked PE in 11/2017 -he took Xarelto for 6 months CT angiogram from 11/2017 was reviewed which shows low clot burden in left upper lobe and bilateral lower lobe pulmonary arteries. He also has chronic back pain and history of radical prostatectomy for prostate cancer and renal calculi.  He is also on Aricept for memory issues  He smoked about half pack per day starting as a teenager until he quit in 2000, about 18 pack years.  He was never formally diagnosed as COPD.  He moved to [facility] after retirement.  He was speaking to his family members in [place] and they suggested getting checked out for coalminer's lung  Significant tests/ events reviewed Echo 11/2017 RVSP 46  Past Medical History:  Diagnosis Date  . Anticoagulated    xarelto  . Depression   . Gait abnormality    MILD --- DOES NOT USE CANE  . 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 History  of kidney stones   . 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 Hyperlipidemia   . Hypertension   . 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 Nocturia    twice  . Prostate cancer ([place])    UROLOGIST-- DR [name]---  Stage T2a, Gleason 4+3, PSA 5.4,  s/p  radical prostatectomy 2015;   05-14-2018  per pt PSA undetectable  . Pulmonary embolism, bilateral ([place]) 12/02/2017   currently taking xarelto  . Wears dentures    upper  . Wears glasses   . Wears hearing aid in both ears     Past Surgical History:  Procedure Laterality Date  . ANTERIOR CERVICAL DECOMP/DISCECTOMY FUSION  09-19-2010    dr Ronnald Ramp  @[facility]   . COLONOSCOPY W/ POLYPECTOMY  last one 03/ 2019  . CYSTOSCOPY N/A 05/17/2018   Procedure: CYSTOSCOPY/ REMOVAL OF BLADDER CALCULUS;  Surgeon: [name], MD;  [facility];  Service: Urology;  Laterality: N/A;  ONLY NEEDS 45 MIN  . FINGER SURGERY  1970s   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 RIGHT MIDDLE FINGER  . IMPLANTATION BONE ANCHORED HEARING AID Right 2011    @[facility]    "  no longer have this"  . LYMPHADENECTOMY Bilateral 08/04/2013   Procedure: LYMPHADENECTOMY;  Surgeon: Dutch Gray, MD;  [facility];  Service: Urology;  Laterality: Bilateral;  . PROSTATE SURGERY    . ROBOT ASSISTED LAPAROSCOPIC RADICAL PROSTATECTOMY N/A 08/04/2013   Procedure: ROBOTIC ASSISTED LAPAROSCOPIC RADICAL PROSTATECTOMY LEVEL 2;  Surgeon: Dutch Gray, MD;  [facility];  Service: Urology;  Laterality: N/A;    Allergies  Allergen Reactions  . Lyrica [Pregabalin] Other (See Comments)    Falls asleep while talking to people  . Percodan [Oxycodone-Aspirin] Hives and Nausea And Vomiting    Per wife " this was 40 yrs ago"  Approx.  60s    Social History   Socioeconomic History  .  Marital status: Married    Spouse name: Not on file  . Number of children: 6  . Years of education: 12+  . Highest education level: Not on file  Occupational History  . Occupation: Retired  Scientific laboratory technician  . Financial resource strain: Not on file  . Food insecurity:    Worry: Not on file    Inability: Not on file  . Transportation needs:    Medical: Not on file    Non-medical: Not on file  Tobacco Use  . Smoking status: Former Smoker    Packs/day: 1.00    Years: 30.00    Pack years: 30.00    Types: Cigarettes    Last attempt to quit: 05/25/2001    Years since quitting: 17.2  . Smokeless tobacco: Former Systems developer    Types: Chew, Snuff    Quit date: 09/22/2001  Substance and Sexual Activity  . Alcohol use: Not Currently  . Drug use: No  . Sexual activity: Not on file  Lifestyle  . Physical activity:    Days per week: Not on file    Minutes per session: Not on file  . Stress: Not on file  Relationships  . Social connections:    Talks on phone: Not on file    Gets together: Not on file    Attends religious service: Not on file    Active member of club or organization: Not on file    Attends meetings of clubs or organizations: Not on file    Relationship status: Not on file  . Intimate partner violence:    Fear of current or ex partner: Not on file    Emotionally abused: Not on file    Physically abused: Not on file    Forced sexual activity: Not on file  Other Topics Concern  . Not on file  Social History Narrative   Lives with wife   Caffeine use: Coffee daily (1-2 per day)   Some soda   Right handed      Family History  Problem Relation Age of Onset  . Stroke Mother   . Dementia Mother   . Heart disease Father   . Stroke Father        heat stroke  . Kidney disease Brother   . Dementia Brother      Review of Systems  Constitutional: Negative for fever and unexpected weight change.  HENT: Negative for congestion, dental problem, ear pain, nosebleeds, postnasal  drip, rhinorrhea, sinus pressure, sneezing, sore throat and trouble swallowing.   Eyes: Negative for redness and itching.  Respiratory: Negative for cough, chest tightness, shortness of breath and wheezing.   Cardiovascular: Negative for palpitations and leg swelling.  Gastrointestinal: Negative for nausea and vomiting.  Genitourinary: Negative for  dysuria.  Musculoskeletal: Negative for joint swelling.  Skin: Negative for rash.  Allergic/Immunologic: Negative.  Negative for environmental allergies, food allergies and immunocompromised state.  Neurological: Negative for headaches.  Hematological: Does not bruise/bleed easily.  Psychiatric/Behavioral: Negative for dysphoric mood. The patient is not nervous/anxious.        Objective:   Physical Exam   Gen. Pleasant, well-nourished, in no distress, normal affect ENT - no pallor,icterus, no post nasal drip Neck: No JVD, no thyromegaly, no carotid bruits Lungs: no use of accessory muscles, no dullness to percussion, clear without rales or rhonchi  Cardiovascular: Rhythm regular, heart sounds  normal, no murmurs or gallops, no peripheral edema Abdomen: soft and non-tender, no hepatosplenomegaly, BS normal. Musculoskeletal: No deformities, no cyanosis or clubbing Neuro:  alert, non focal      Assessment & Plan:

## 2018-08-13 NOTE — Patient Instructions (Signed)
Schedule pFTs  Prescription for albuterol MDI 2 puffs every 6 hours as needed

## 2018-08-13 NOTE — Assessment & Plan Note (Signed)
I reviewed his CT imaging from 11/2017.  This does not show any evidence of pneumoconiosis or ILD. It does not show significant emphysema. We will schedule full PFTs-if this is an obstructive pattern then we would favor COPD due to smoking.  If this has a predominant restrictive pattern then we can consider pneumoconiosis  If neither then we may have to consider other etiology.  He does not seem to have any thing to suggest coronary artery disease.  Pulmonary hypertension was moderate when he had the acute PE and should have resolved by now

## 2018-08-16 MED ORDER — ALBUTEROL SULFATE HFA 108 (90 BASE) MCG/ACT IN AERS: 1.0000 | INHALATION_SPRAY | Freq: Four times a day (QID) | RESPIRATORY_TRACT | 1 refills | Status: DC | PRN

## 2018-08-16 NOTE — Addendum Note (Signed)
Addended by: Valerie Salts on: 08/16/2018 02:11 PM   Modules accepted: Orders

## 2018-08-23 ENCOUNTER — Encounter: Payer: Self-pay | Admitting: Registered Nurse

## 2018-08-23 ENCOUNTER — Other Ambulatory Visit: Payer: Self-pay

## 2018-08-23 ENCOUNTER — Encounter: Payer: Medicare Other | Attending: Registered Nurse | Admitting: Registered Nurse

## 2018-08-23 VITALS — BP 117/69 | HR 58 | Ht 68.0 in | Wt 184.0 lb

## 2018-08-23 DIAGNOSIS — M961 Postlaminectomy syndrome, not elsewhere classified: Secondary | ICD-10-CM | POA: Diagnosis not present

## 2018-08-23 DIAGNOSIS — M25511 Pain in right shoulder: Secondary | ICD-10-CM | POA: Insufficient documentation

## 2018-08-23 DIAGNOSIS — G8929 Other chronic pain: Secondary | ICD-10-CM | POA: Insufficient documentation

## 2018-08-23 DIAGNOSIS — Z8249 Family history of ischemic heart disease and other diseases of the circulatory system: Secondary | ICD-10-CM | POA: Insufficient documentation

## 2018-08-23 DIAGNOSIS — Z981 Arthrodesis status: Secondary | ICD-10-CM | POA: Insufficient documentation

## 2018-08-23 DIAGNOSIS — G894 Chronic pain syndrome: Secondary | ICD-10-CM

## 2018-08-23 DIAGNOSIS — Z841 Family history of disorders of kidney and ureter: Secondary | ICD-10-CM | POA: Insufficient documentation

## 2018-08-23 DIAGNOSIS — M546 Pain in thoracic spine: Secondary | ICD-10-CM

## 2018-08-23 DIAGNOSIS — M4802 Spinal stenosis, cervical region: Secondary | ICD-10-CM

## 2018-08-23 DIAGNOSIS — M25512 Pain in left shoulder: Secondary | ICD-10-CM | POA: Insufficient documentation

## 2018-08-23 DIAGNOSIS — M542 Cervicalgia: Secondary | ICD-10-CM | POA: Diagnosis not present

## 2018-08-23 DIAGNOSIS — Z87442 Personal history of urinary calculi: Secondary | ICD-10-CM | POA: Insufficient documentation

## 2018-08-23 DIAGNOSIS — M545 Low back pain: Secondary | ICD-10-CM

## 2018-08-23 DIAGNOSIS — E785 Hyperlipidemia, unspecified: Secondary | ICD-10-CM | POA: Insufficient documentation

## 2018-08-23 DIAGNOSIS — Z823 Family history of stroke: Secondary | ICD-10-CM | POA: Insufficient documentation

## 2018-08-23 DIAGNOSIS — Z87891 Personal history of nicotine dependence: Secondary | ICD-10-CM | POA: Insufficient documentation

## 2018-08-23 DIAGNOSIS — H9190 Unspecified hearing loss, unspecified ear: Secondary | ICD-10-CM | POA: Insufficient documentation

## 2018-08-23 DIAGNOSIS — I10 Essential (primary) hypertension: Secondary | ICD-10-CM | POA: Insufficient documentation

## 2018-08-23 DIAGNOSIS — Z5181 Encounter for therapeutic drug level monitoring: Secondary | ICD-10-CM

## 2018-08-23 DIAGNOSIS — Z79899 Other long term (current) drug therapy: Secondary | ICD-10-CM

## 2018-08-23 MED ORDER — OXYCODONE HCL 5 MG PO TABS: 5.0000 mg | ORAL_TABLET | Freq: Four times a day (QID) | ORAL | 0 refills | Status: DC | PRN

## 2018-08-23 NOTE — Progress Notes (Addendum)
Subjective:    Patient ID: Fred [name], male    DOB: 1951/03/17, 68 y.o.   MRN: [id number]  HPI: Fred [name] is a 68 y.o. male his appointment was change, due to national recommendations of social distancing due to [place] 19, an audio/video telehealth visit is felt to be most appropriate for this patient at this time.  See Chart message from today for the patient's consent to telehealth from [place].     He states his pain is located in his neck, middle and lower back. He rates his pain 4. His current exercise regime is walking and performing stretching exercises. This provider asked Fred [name] to check his heart rate he stated his blood pressure was 149/112 and heart rate was 58. I asked Fred [name] and spoke with his wife to re-check his vitals. This provider called Fred [name] at 11:00 am blood pressure was 149/96 and heart rate 67.   Fred [name] Morphine equivalent is  30.00 MME. Last Oral Swab was performed on 10/01/2017, it was consistent.    Fred Overland RN, asked the Health and History Questions. This Provider and Fred [name]  verified we were  speaking with the correct person using two identifiers.  Pain Inventory Average Pain 5 Pain Right Now 4 My pain is constant, sharp, stabbing, tingling, aching and numbness  In the last 24 hours, has pain interfered with the following? General activity 5 Relation with others 5 Enjoyment of life 6 What TIME of day is your pain at its worst? daytime and evening Sleep (in general) Good  Pain is worse with: walking and standing Pain improves with: medication Relief from Meds: 5  Mobility walk without assistance how many minutes can you walk? 10-15 ability to climb steps?  yes do you drive?  yes  Function disabled: date disabled . I need assistance with the following:  meal prep, household duties and shopping  Neuro/Psych numbness confusion loss of taste or smell  Prior  Studies Any changes since last visit?  no  Physicians involved in your care Any changes since last visit?  no   Family History  Problem Relation Age of Onset  . Stroke Mother   . Dementia Mother   . Heart disease Father   . Stroke Father        heat stroke  . Kidney disease Brother   . Dementia Brother    Social History   Socioeconomic History  . Marital status: Married    Spouse name: Not on file  . Number of children: 6  . Years of education: 12+  . Highest education level: Not on file  Occupational History  . Occupation: Retired  Scientific laboratory technician  . Financial resource strain: Not on file  . Food insecurity:    Worry: Not on file    Inability: Not on file  . Transportation needs:    Medical: Not on file    Non-medical: Not on file  Tobacco Use  . Smoking status: Former Smoker    Packs/day: 1.00    Years: 30.00    Pack years: 30.00    Types: Cigarettes    Last attempt to quit: 05/25/2001    Years since quitting: 17.2  . Smokeless tobacco: Former Systems developer    Types: Chew, Snuff    Quit date: 09/22/2001  Substance and Sexual Activity  . Alcohol use: Not Currently  . Drug use: No  . Sexual activity: Not on file  Lifestyle  .  Physical activity:    Days per week: Not on file    Minutes per session: Not on file  . Stress: Not on file  Relationships  . Social connections:    Talks on phone: Not on file    Gets together: Not on file    Attends religious service: Not on file    Active member of club or organization: Not on file    Attends meetings of clubs or organizations: Not on file    Relationship status: Not on file  Other Topics Concern  . Not on file  Social History Narrative   Lives with wife   Caffeine use: Coffee daily (1-2 per day)   Some soda   Right handed    Past Surgical History:  Procedure Laterality Date  . ANTERIOR CERVICAL DECOMP/DISCECTOMY FUSION  09-19-2010    dr Ronnald Ramp  @[facility]   . COLONOSCOPY W/ POLYPECTOMY  last one 03/ 2019  . CYSTOSCOPY N/A  05/17/2018   Procedure: CYSTOSCOPY/ REMOVAL OF BLADDER CALCULUS;  Surgeon: [name], MD;  [facility];  Service: Urology;  Laterality: N/A;  ONLY NEEDS 45 MIN  . FINGER SURGERY  1970s   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 RIGHT MIDDLE FINGER  . IMPLANTATION BONE ANCHORED HEARING AID Right 2011    @[facility]    "no longer have this"  . LYMPHADENECTOMY Bilateral 08/04/2013   Procedure: LYMPHADENECTOMY;  Surgeon: Dutch Gray, MD;  [facility];  Service: Urology;  Laterality: Bilateral;  . PROSTATE SURGERY    . ROBOT ASSISTED LAPAROSCOPIC RADICAL PROSTATECTOMY N/A 08/04/2013   Procedure: ROBOTIC ASSISTED LAPAROSCOPIC RADICAL PROSTATECTOMY LEVEL 2;  Surgeon: Dutch Gray, MD;  [facility];  Service: Urology;  Laterality: N/A;   Past Medical History:  Diagnosis Date  . Anticoagulated    xarelto  . Depression   . Gait abnormality    MILD --- DOES NOT USE CANE  . 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 History of kidney stones   . 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 Hyperlipidemia   . Hypertension   . 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 Nocturia    twice  . Prostate cancer ([place])    UROLOGIST-- DR [name]---  Stage T2a, Gleason 4+3, PSA 5.4,  s/p  radical prostatectomy 2015;   05-14-2018  per pt PSA undetectable  . Pulmonary embolism, bilateral ([place]) 12/02/2017   currently taking xarelto  . Wears dentures    upper  . Wears glasses   . Wears hearing aid in both ears    BP 117/69 Comment: patient checked with home monitor  Pulse (!) 58 Comment: patient checked with home monitor  Ht 5\' 8"  (1.727 m) Comment: reported  Wt 184 lb (83.5 kg) Comment: patient checked with home scale  BMI 27.98 kg/m   Opioid Risk Score:   Fall Risk Score:  `1   Depression screen PHQ 2/9  Depression screen [facility] 2/9 08/23/2018 04/05/2018 02/04/2018 11/03/2017 02/19/2017 12/12/2014 08/28/2014  Decreased Interest 0 1 0 0 0 0 2  Down, Depressed, Hopeless 1 1 0 0 0 0 1  PHQ - 2 Score 1 2 0 0 0 0 3  Altered sleeping - - - - - - 1  Tired, decreased energy - - - - - - 2  Change in appetite - - - - - - 0  Feeling bad or failure about yourself  - - - - - - 0  Trouble concentrating - - - - - - 1  Moving slowly or fidgety/restless - - - - - - 0  Suicidal thoughts - - - - - - 0  PHQ-9 Score - - - - - - 7     Review of Systems  Constitutional: Negative.   HENT: Negative.        Loss of taste or smell  Eyes: Negative.   Respiratory: Negative.   Cardiovascular: Negative.   Gastrointestinal: Negative.   Endocrine: Negative.   Genitourinary: Negative.   Musculoskeletal: Negative.   Skin: Negative.   Allergic/Immunologic: Negative.   Neurological: Positive for numbness.  Hematological: Negative.   Psychiatric/Behavioral: Positive for confusion.  All other systems reviewed and are negative.      Objective:   Physical Exam Vitals signs and nursing note reviewed.  Neurological:     Mental Status: He is oriented to person, place, and time.           Assessment & Plan:  1.Cervical Postlaminectomy: Continue HEP and Continue current medication regime. 08/23/2018. 2. Cervicalgia/ Cervical Radiculitis: Continue current medication regime. Continue to Monitor.08/23/2018 3. Chronic BilateralShoulder Pain: No complaints today. Continue HEP as Tolerated.08/23/2018 4. L2 compression fracture/ Chronic Bilateral Low Back Pain without Sciatica: Continue to Monitor:08/23/2018 Refilled:Oxycodone 5 mg one tablet every 6hours as needed for moderate pain. # 90. We will Continue the opioid monitoring program. This consists of regular clinic visits, examinations, urine drug screen, pill counts as well as use of [place] controlled substance reporting  System. 5. Polyarthralgia: Continue to Monitor.04/06//2020  Location of patient: His Home Location of provider: Office Time spent on call: 12 minutes   F/U in 1 month

## 2018-09-22 ENCOUNTER — Encounter: Payer: Self-pay | Admitting: Registered Nurse

## 2018-09-22 ENCOUNTER — Encounter: Payer: Medicare Other | Attending: Registered Nurse | Admitting: Registered Nurse

## 2018-09-22 ENCOUNTER — Other Ambulatory Visit: Payer: Self-pay

## 2018-09-22 VITALS — BP 137/94 | HR 62 | Temp 97.4°F | Ht 67.0 in | Wt 180.0 lb

## 2018-09-22 DIAGNOSIS — Z5181 Encounter for therapeutic drug level monitoring: Secondary | ICD-10-CM

## 2018-09-22 DIAGNOSIS — M542 Cervicalgia: Secondary | ICD-10-CM | POA: Diagnosis not present

## 2018-09-22 DIAGNOSIS — Z823 Family history of stroke: Secondary | ICD-10-CM | POA: Insufficient documentation

## 2018-09-22 DIAGNOSIS — M546 Pain in thoracic spine: Secondary | ICD-10-CM | POA: Diagnosis not present

## 2018-09-22 DIAGNOSIS — M4802 Spinal stenosis, cervical region: Secondary | ICD-10-CM

## 2018-09-22 DIAGNOSIS — M545 Low back pain: Secondary | ICD-10-CM

## 2018-09-22 DIAGNOSIS — I10 Essential (primary) hypertension: Secondary | ICD-10-CM | POA: Insufficient documentation

## 2018-09-22 DIAGNOSIS — M25512 Pain in left shoulder: Secondary | ICD-10-CM | POA: Insufficient documentation

## 2018-09-22 DIAGNOSIS — H9190 Unspecified hearing loss, unspecified ear: Secondary | ICD-10-CM | POA: Insufficient documentation

## 2018-09-22 DIAGNOSIS — M961 Postlaminectomy syndrome, not elsewhere classified: Secondary | ICD-10-CM

## 2018-09-22 DIAGNOSIS — E785 Hyperlipidemia, unspecified: Secondary | ICD-10-CM | POA: Insufficient documentation

## 2018-09-22 DIAGNOSIS — G894 Chronic pain syndrome: Secondary | ICD-10-CM

## 2018-09-22 DIAGNOSIS — M25511 Pain in right shoulder: Secondary | ICD-10-CM | POA: Insufficient documentation

## 2018-09-22 DIAGNOSIS — Z87442 Personal history of urinary calculi: Secondary | ICD-10-CM | POA: Insufficient documentation

## 2018-09-22 DIAGNOSIS — Z981 Arthrodesis status: Secondary | ICD-10-CM | POA: Insufficient documentation

## 2018-09-22 DIAGNOSIS — Z79899 Other long term (current) drug therapy: Secondary | ICD-10-CM

## 2018-09-22 DIAGNOSIS — Z841 Family history of disorders of kidney and ureter: Secondary | ICD-10-CM | POA: Insufficient documentation

## 2018-09-22 DIAGNOSIS — Z87891 Personal history of nicotine dependence: Secondary | ICD-10-CM | POA: Insufficient documentation

## 2018-09-22 DIAGNOSIS — Z8249 Family history of ischemic heart disease and other diseases of the circulatory system: Secondary | ICD-10-CM | POA: Insufficient documentation

## 2018-09-22 DIAGNOSIS — G8929 Other chronic pain: Secondary | ICD-10-CM | POA: Insufficient documentation

## 2018-09-22 MED ORDER — OXYCODONE HCL 5 MG PO TABS: 5.0000 mg | ORAL_TABLET | Freq: Four times a day (QID) | ORAL | 0 refills | Status: DC | PRN

## 2018-09-22 NOTE — Progress Notes (Signed)
Subjective:    Patient ID: Fred [name], male    DOB: Jan 15, 1951, 68 y.o.   MRN: [id number]  HPI: Fred [name] is a 68 y.o. male his appointment was changed, due to national recommendations of social distancing due to [place] 19, an audio/video telehealth visit is felt to be most appropriate for this patient at this time.  See Chart message from today for the patient's consent to telehealth from [place].     He states his pain is located in his neck, upper- lower back and reports numbness in his bilateral feet. He rates his pain 5. His current exercise regime is walking.   This provider asked Fred [name] to check his pulse again, vitals documented.   Fred [name] Morphine equivalent is 30.00 MME.  Last Oral Swab was Performed 09/30/2017, it was consistent.   Fred [name] CMA asked the Health an History Questions. This provider and Fred [name] verified we were speaking with the correct person using two identifiers.   Pain Inventory Average Pain 4 Pain Right Now 5 My pain is constant, sharp, tingling and aching  In the last 24 hours, has pain interfered with the following? General activity 5 Relation with others 5 Enjoyment of life 5 What TIME of day is your pain at its worst? daytime, evening Sleep (in general) Good  Pain is worse with: walking, bending, standing and some activites Pain improves with: rest and medication Relief from Meds: 6  Mobility walk without assistance how many minutes can you walk? 10 ability to climb steps?  yes do you drive?  yes  Function disabled: date disabled . retired  Neuro/Psych numbness tremor tingling  Prior Studies Any changes since last visit?  no  Physicians involved in your care Any changes since last visit?  no   Family History  Problem Relation Age of Onset  . Stroke Mother   . Dementia Mother   . Heart disease Father   . Stroke Father        heat stroke  . Kidney  disease Brother   . Dementia Brother    Social History   Socioeconomic History  . Marital status: Married    Spouse name: Not on file  . Number of children: 6  . Years of education: 12+  . Highest education level: Not on file  Occupational History  . Occupation: Retired  Scientific laboratory technician  . Financial resource strain: Not on file  . Food insecurity:    Worry: Not on file    Inability: Not on file  . Transportation needs:    Medical: Not on file    Non-medical: Not on file  Tobacco Use  . Smoking status: Former Smoker    Packs/day: 1.00    Years: 30.00    Pack years: 30.00    Types: Cigarettes    Last attempt to quit: 05/25/2001    Years since quitting: 17.3  . Smokeless tobacco: Former Systems developer    Types: Chew, Snuff    Quit date: 09/22/2001  Substance and Sexual Activity  . Alcohol use: Not Currently  . Drug use: No  . Sexual activity: Not on file  Lifestyle  . Physical activity:    Days per week: Not on file    Minutes per session: Not on file  . Stress: Not on file  Relationships  . Social connections:    Talks on phone: Not on file    Gets together: Not on file  Attends religious service: Not on file    Active member of club or organization: Not on file    Attends meetings of clubs or organizations: Not on file    Relationship status: Not on file  Other Topics Concern  . Not on file  Social History Narrative   Lives with wife   Caffeine use: Coffee daily (1-2 per day)   Some soda   Right handed    Past Surgical History:  Procedure Laterality Date  . ANTERIOR CERVICAL DECOMP/DISCECTOMY FUSION  09-19-2010    dr Ronnald Ramp  @[facility]   . COLONOSCOPY W/ POLYPECTOMY  last one 03/ 2019  . CYSTOSCOPY N/A 05/17/2018   Procedure: CYSTOSCOPY/ REMOVAL OF BLADDER CALCULUS;  Surgeon: [name], MD;  [facility];  Service: Urology;  Laterality: N/A;  ONLY NEEDS 45 MIN  . FINGER SURGERY  1970s   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 RIGHT MIDDLE FINGER   . IMPLANTATION BONE ANCHORED HEARING AID Right 2011    @[facility]    "no longer have this"  . LYMPHADENECTOMY Bilateral 08/04/2013   Procedure: LYMPHADENECTOMY;  Surgeon: Dutch Gray, MD;  [facility];  Service: Urology;  Laterality: Bilateral;  . PROSTATE SURGERY    . ROBOT ASSISTED LAPAROSCOPIC RADICAL PROSTATECTOMY N/A 08/04/2013   Procedure: ROBOTIC ASSISTED LAPAROSCOPIC RADICAL PROSTATECTOMY LEVEL 2;  Surgeon: Dutch Gray, MD;  [facility];  Service: Urology;  Laterality: N/A;   Past Medical History:  Diagnosis Date  . Anticoagulated    xarelto  . Depression   . Gait abnormality    MILD --- DOES NOT USE CANE  . 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 History of kidney stones   . 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 Hyperlipidemia   . Hypertension   . 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 Nocturia    twice  . Prostate cancer ([place])    UROLOGIST-- DR [name]---  Stage T2a, Gleason 4+3, PSA 5.4,  s/p  radical prostatectomy 2015;   05-14-2018  per pt PSA undetectable  . Pulmonary embolism, bilateral ([place]) 12/02/2017   currently taking xarelto  . Wears dentures    upper  . Wears glasses   . Wears hearing aid in both ears    BP 114/73   Pulse (!) 55   Temp (!) 97.4 F (36.3 C)   Ht 5\' 7"  (1.702 m)   Wt 180 lb (81.6 kg)   BMI 28.19 kg/m   Opioid Risk Score:   Fall Risk Score:  `1  Depression screen PHQ 2/9  Depression screen [facility] 2/9 08/23/2018 04/05/2018 02/04/2018 11/03/2017 02/19/2017 12/12/2014 08/28/2014  Decreased Interest 0 1 0 0 0 0 2  Down, Depressed, Hopeless 1 1 0 0 0 0 1  PHQ - 2 Score 1 2 0 0 0 0 3  Altered sleeping - - - - - - 1  Tired, decreased energy - - - - - - 2  Change in appetite - - - - - - 0  Feeling bad or failure about yourself  - - - - - - 0   Trouble concentrating - - - - - - 1  Moving slowly or fidgety/restless - - - - - - 0  Suicidal  thoughts - - - - - - 0  PHQ-9 Score - - - - - - 7   Review of Systems  Constitutional: Negative.   HENT: Negative.   Eyes: Negative.   Respiratory: Negative.   Cardiovascular: Negative.   Gastrointestinal: Negative.   Endocrine: Negative.   Genitourinary: Negative.   Musculoskeletal: Positive for back pain and neck pain.  Skin: Negative.   Allergic/Immunologic: Negative.   Neurological: Positive for tremors and numbness.       Tingling  Psychiatric/Behavioral: Negative.   All other systems reviewed and are negative.      Objective:   Physical Exam Vitals signs and nursing note reviewed.  Musculoskeletal:     Comments: No Physical Exam Perform: Virtual Visit           Assessment & Plan:  1.Cervical Postlaminectomy: Continue HEP and Continue current medication regime.09/22/2018. 2. Cervicalgia/ Cervical Radiculitis: Continue current medication regime. Continue to Monitor.09/22/2018 3. Chronic BilateralShoulder Pain: No complaints today. Continue HEP as Tolerated.09/22/2018 4. L2 compression fracture/ Chronic Bilateral Low Back Pain without Sciatica: Continue to Monitor:09/22/2018 Refilled:Oxycodone 5 mg one tablet every 6hours as needed for moderate pain. # 90. We will Continue the opioid monitoring program. This consists of regular clinic visits, examinations, urine drug screen, pill counts as well as use of [place] controlled substance reporting System. 5. Polyarthralgia: Continue to Monitor.05/06//2020  F/U in 1 month   Telephone Call  Location of patient: In his Home Location of provider: Office Established patient Time spent on call: 10 Minutes

## 2018-10-22 ENCOUNTER — Other Ambulatory Visit: Payer: Self-pay

## 2018-10-22 ENCOUNTER — Encounter: Payer: Medicare Other | Attending: Registered Nurse | Admitting: Registered Nurse

## 2018-10-22 ENCOUNTER — Encounter: Payer: Self-pay | Admitting: Registered Nurse

## 2018-10-22 VITALS — BP 137/90 | HR 60 | Temp 98.3°F | Ht 68.0 in | Wt 180.2 lb

## 2018-10-22 DIAGNOSIS — Z823 Family history of stroke: Secondary | ICD-10-CM | POA: Diagnosis not present

## 2018-10-22 DIAGNOSIS — M546 Pain in thoracic spine: Secondary | ICD-10-CM

## 2018-10-22 DIAGNOSIS — E785 Hyperlipidemia, unspecified: Secondary | ICD-10-CM | POA: Diagnosis not present

## 2018-10-22 DIAGNOSIS — Z5181 Encounter for therapeutic drug level monitoring: Secondary | ICD-10-CM

## 2018-10-22 DIAGNOSIS — Z981 Arthrodesis status: Secondary | ICD-10-CM | POA: Diagnosis not present

## 2018-10-22 DIAGNOSIS — Z841 Family history of disorders of kidney and ureter: Secondary | ICD-10-CM | POA: Diagnosis not present

## 2018-10-22 DIAGNOSIS — I10 Essential (primary) hypertension: Secondary | ICD-10-CM | POA: Insufficient documentation

## 2018-10-22 DIAGNOSIS — Z87442 Personal history of urinary calculi: Secondary | ICD-10-CM | POA: Diagnosis not present

## 2018-10-22 DIAGNOSIS — M25511 Pain in right shoulder: Secondary | ICD-10-CM | POA: Insufficient documentation

## 2018-10-22 DIAGNOSIS — Z79891 Long term (current) use of opiate analgesic: Secondary | ICD-10-CM | POA: Diagnosis not present

## 2018-10-22 DIAGNOSIS — G894 Chronic pain syndrome: Secondary | ICD-10-CM

## 2018-10-22 DIAGNOSIS — G8929 Other chronic pain: Secondary | ICD-10-CM | POA: Insufficient documentation

## 2018-10-22 DIAGNOSIS — H9190 Unspecified hearing loss, unspecified ear: Secondary | ICD-10-CM | POA: Insufficient documentation

## 2018-10-22 DIAGNOSIS — M961 Postlaminectomy syndrome, not elsewhere classified: Secondary | ICD-10-CM | POA: Diagnosis not present

## 2018-10-22 DIAGNOSIS — Z87891 Personal history of nicotine dependence: Secondary | ICD-10-CM | POA: Diagnosis not present

## 2018-10-22 DIAGNOSIS — Z8249 Family history of ischemic heart disease and other diseases of the circulatory system: Secondary | ICD-10-CM | POA: Insufficient documentation

## 2018-10-22 DIAGNOSIS — M542 Cervicalgia: Secondary | ICD-10-CM | POA: Insufficient documentation

## 2018-10-22 DIAGNOSIS — M545 Low back pain, unspecified: Secondary | ICD-10-CM

## 2018-10-22 DIAGNOSIS — M25512 Pain in left shoulder: Secondary | ICD-10-CM | POA: Diagnosis not present

## 2018-10-22 DIAGNOSIS — M4802 Spinal stenosis, cervical region: Secondary | ICD-10-CM

## 2018-10-22 MED ORDER — OXYCODONE HCL 5 MG PO TABS: 5.0000 mg | ORAL_TABLET | Freq: Four times a day (QID) | ORAL | 0 refills | Status: DC | PRN

## 2018-10-22 NOTE — Addendum Note (Signed)
Addended by: Caro Hight on: 10/22/2018 10:49 AM   Modules accepted: Orders

## 2018-10-22 NOTE — Progress Notes (Signed)
Subjective:    Patient ID: Fred [name], male    DOB: [birth year], 68 y.o.   MRN: [id number]  HPI: Fred [name] is a 68 y.o. male who returns for follow up appointment for chronic pain and medication refill. He states his  pain is located in his neck radiating into his bilateral shoulders and mid- lower back pain. He rates his  pain 4. His current exercise regime is walking and performing stretching exercises.  Fred [name] Morphine equivalent is 30.00MME.  Last Oral Swab was Performed on 10/01/2017, it was consistent.    Pain Inventory Average Pain 4 Pain Right Now 4 My pain is sharp, stabbing and tingling  In the last 24 hours, has pain interfered with the following? General activity 4 Relation with others 4 Enjoyment of life 4 What TIME of day is your pain at its worst? daytime, evening and night Sleep (in general) Good  Pain is worse with: walking, sitting and standing Pain improves with: medication Relief from Meds: 3  Mobility ability to climb steps?  yes  Function disabled: date disabled na  Neuro/Psych No problems in this area  Prior Studies Any changes since last visit?  no  Physicians involved in your care Any changes since last visit?  no   Family History  Problem Relation Age of Onset  . Stroke Mother   . Dementia Mother   . Heart disease Father   . Stroke Father        heat stroke  . Kidney disease Brother   . Dementia Brother    Social History   Socioeconomic History  . Marital status: Married    Spouse name: Not on file  . Number of children: 6  . Years of education: 12+  . Highest education level: Not on file  Occupational History  . Occupation: Retired  Scientific laboratory technician  . Financial resource strain: Not on file  . Food insecurity:    Worry: Not on file    Inability: Not on file  . Transportation needs:    Medical: Not on file    Non-medical: Not on file  Tobacco Use  . Smoking status: Former Smoker    Packs/day: 1.00   Years: 30.00    Pack years: 30.00    Types: Cigarettes    Last attempt to quit: 05/25/2001    Years since quitting: 17.4  . Smokeless tobacco: Former Systems developer    Types: Chew, Snuff    Quit date: 09/22/2001  Substance and Sexual Activity  . Alcohol use: Not Currently  . Drug use: No  . Sexual activity: Not on file  Lifestyle  . Physical activity:    Days per week: Not on file    Minutes per session: Not on file  . Stress: Not on file  Relationships  . Social connections:    Talks on phone: Not on file    Gets together: Not on file    Attends religious service: Not on file    Active member of club or organization: Not on file    Attends meetings of clubs or organizations: Not on file    Relationship status: Not on file  Other Topics Concern  . Not on file  Social History Narrative   Lives with wife   Caffeine use: Coffee daily (1-2 per day)   Some soda   Right handed    Past Surgical History:  Procedure Laterality Date  . ANTERIOR CERVICAL DECOMP/DISCECTOMY FUSION  09-19-2010    dr  Fred [name]  @[facility]   . COLONOSCOPY W/ POLYPECTOMY  last one 03/ 2019  . CYSTOSCOPY N/A 05/17/2018   Procedure: CYSTOSCOPY/ REMOVAL OF BLADDER CALCULUS;  Surgeon: Fred Bring, MD;  Location: [facility];  Service: Urology;  Laterality: N/A;  ONLY NEEDS 45 MIN  . FINGER SURGERY  1970s   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 RIGHT MIDDLE FINGER  . IMPLANTATION BONE ANCHORED HEARING AID Right 2011    @[facility]    "no longer have this"  . LYMPHADENECTOMY Bilateral 08/04/2013   Procedure: LYMPHADENECTOMY;  Surgeon: Dutch Gray, MD;  Location: [facility];  Service: Urology;  Laterality: Bilateral;  . PROSTATE SURGERY    . ROBOT ASSISTED LAPAROSCOPIC RADICAL PROSTATECTOMY N/A 08/04/2013   Procedure: ROBOTIC ASSISTED LAPAROSCOPIC RADICAL PROSTATECTOMY LEVEL 2;  Surgeon: Dutch Gray, MD;  [facility];  Service: Urology;  Laterality: N/A;   Past Medical History:  Diagnosis Date  . Anticoagulated     xarelto  . Depression   . Gait abnormality    MILD --- DOES NOT USE CANE  . 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 History of kidney stones   . 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 Hyperlipidemia   . Hypertension   . 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 Nocturia    twice  . Prostate cancer ([place])    UROLOGIST-- DR [name]---  Stage T2a, Gleason 4+3, PSA 5.4,  s/p  radical prostatectomy 2015;   05-14-2018  per pt PSA undetectable  . Pulmonary embolism, bilateral ([place]) 12/02/2017   currently taking xarelto  . Wears dentures    upper  . Wears glasses   . Wears hearing aid in both ears    BP 137/90   Pulse (!) 53   Temp 98.3 F (36.8 C)   Ht 5\' 8"  (1.727 m)   Wt 180 lb 3.2 oz (81.7 kg)   SpO2 98%   BMI 27.40 kg/m   Opioid Risk Score:   Fall Risk Score:  `1  Depression screen PHQ 2/9  Depression screen [facility] 2/9 10/22/2018 08/23/2018 04/05/2018 02/04/2018 11/03/2017 02/19/2017 12/12/2014  Decreased Interest 0 0 1 0 0 0 0  Down, Depressed, Hopeless 1 1 1  0 0 0 0  PHQ - 2 Score 1 1 2  0 0 0 0  Altered sleeping - - - - - - -  Tired, decreased energy - - - - - - -  Change in appetite - - - - - - -  Feeling bad or failure about yourself  - - - - - - -  Trouble concentrating - - - - - - -  Moving slowly or fidgety/restless - - - - - - -  Suicidal thoughts - - - - - - -  PHQ-9 Score - - - - - - -    Review of Systems  Constitutional: Negative.   HENT: Negative.   Eyes: Negative.   Respiratory: Negative.   Cardiovascular: Negative.   Gastrointestinal: Negative.   Endocrine: Negative.   Genitourinary: Negative.   Musculoskeletal: Positive for back pain and neck pain.  Skin: Negative.   Allergic/Immunologic: Negative.   Neurological: Negative.   Hematological: Negative.    Psychiatric/Behavioral: Negative.   All other systems reviewed  and are negative.      Objective:   Physical Exam Vitals signs and nursing note reviewed.  Constitutional:      Appearance: Normal appearance.  Neck:     Musculoskeletal: Normal range of motion and neck supple.  Cardiovascular:     Rate and Rhythm: Normal rate and regular rhythm.     Pulses: Normal pulses.     Heart sounds: Normal heart sounds.  Pulmonary:     Effort: Pulmonary effort is normal.     Breath sounds: Normal breath sounds.  Musculoskeletal:     Comments: Normal Muscle Bulk and Muscle Testing Reveals:  Upper Extremities: Full ROM and Muscle Strength 5/5 Thoracic Paraspinal Tenderness: T-1-T-3 , Lumbar Paraspinal Tenderness: L-4-L-5 Lower Extremities: Full ROM and Muscle Strength 5/5 Arises from chair with ease Narrow Based  Gait   Skin:    General: Skin is warm and dry.  Neurological:     Mental Status: He is alert and oriented to person, place, and time.  Psychiatric:        Mood and Affect: Mood normal.        Behavior: Behavior normal.           Assessment & Plan:  1.Cervical Postlaminectomy: Continue HEP and Continue current medication regime.10/22/2018. 2. Cervicalgia/ Cervical Radiculitis: Continue current medication regime. Continue to Monitor.10/22/2018 3. Chronic BilateralShoulder Pain:Continue HEP as Tolerated.10/22/2018 4. L2 compression fracture/ Chronic Bilateral Low Back Pain without Sciatica: Continue to Monitor:10/22/2018 Refilled:Oxycodone 5 mg one tablet every 6hours as needed for moderate pain. # 90. We will Continue the opioid monitoring program. This consists of regular clinic visits, examinations, urine drug screen, pill counts as well as use of [place] controlled substance reporting System. 5. Polyarthralgia: Continue to Monitor.06/05//2020  F/U in 1 month

## 2018-10-26 LAB — DRUG TOX MONITOR 1 W/CONF, ORAL FLD
Amphetamines: NEGATIVE ng/mL (ref <10)
Barbiturates: NEGATIVE ng/mL (ref <10)
Benzodiazepines: NEGATIVE ng/mL (ref <0.50)
Buprenorphine: NEGATIVE ng/mL (ref <0.10)
Cocaine: NEGATIVE ng/mL (ref <5.0)
Codeine: NEGATIVE ng/mL (ref <2.5)
Dihydrocodeine: NEGATIVE ng/mL (ref <2.5)
Fentanyl: NEGATIVE ng/mL (ref <0.10)
Heroin Metabolite: NEGATIVE ng/mL (ref <1.0)
Hydrocodone: NEGATIVE ng/mL (ref <2.5)
Hydromorphone: NEGATIVE ng/mL (ref <2.5)
MARIJUANA: NEGATIVE ng/mL (ref <2.5)
MDMA: NEGATIVE ng/mL (ref <10)
Meprobamate: NEGATIVE ng/mL (ref <2.5)
Methadone: NEGATIVE ng/mL (ref <5.0)
Morphine: NEGATIVE ng/mL (ref <2.5)
Nicotine Metabolite: NEGATIVE ng/mL (ref <5.0)
Norhydrocodone: NEGATIVE ng/mL (ref <2.5)
Noroxycodone: 14.3 ng/mL — ABNORMAL HIGH (ref <2.5)
Opiates: POSITIVE ng/mL — AB (ref <2.5)
Oxycodone: 227.6 ng/mL — ABNORMAL HIGH (ref <2.5)
Oxymorphone: NEGATIVE ng/mL (ref <2.5)
Phencyclidine: NEGATIVE ng/mL (ref <10)
Tapentadol: NEGATIVE ng/mL (ref <5.0)
Tramadol: NEGATIVE ng/mL (ref <5.0)
Zolpidem: NEGATIVE ng/mL (ref <5.0)

## 2018-10-26 LAB — DRUG TOX ALC METAB W/CON, ORAL FLD: Alcohol Metabolite: NEGATIVE ng/mL (ref <25)

## 2018-11-23 ENCOUNTER — Encounter: Payer: Medicare Other | Attending: Registered Nurse | Admitting: Registered Nurse

## 2018-11-23 ENCOUNTER — Encounter: Payer: Self-pay | Admitting: Registered Nurse

## 2018-11-23 ENCOUNTER — Other Ambulatory Visit: Payer: Self-pay

## 2018-11-23 VITALS — BP 150/93 | HR 57 | Resp 12 | Wt 185.0 lb

## 2018-11-23 DIAGNOSIS — Z5181 Encounter for therapeutic drug level monitoring: Secondary | ICD-10-CM

## 2018-11-23 DIAGNOSIS — I10 Essential (primary) hypertension: Secondary | ICD-10-CM | POA: Insufficient documentation

## 2018-11-23 DIAGNOSIS — M546 Pain in thoracic spine: Secondary | ICD-10-CM

## 2018-11-23 DIAGNOSIS — M25512 Pain in left shoulder: Secondary | ICD-10-CM | POA: Diagnosis not present

## 2018-11-23 DIAGNOSIS — Z981 Arthrodesis status: Secondary | ICD-10-CM | POA: Insufficient documentation

## 2018-11-23 DIAGNOSIS — M961 Postlaminectomy syndrome, not elsewhere classified: Secondary | ICD-10-CM

## 2018-11-23 DIAGNOSIS — G8929 Other chronic pain: Secondary | ICD-10-CM | POA: Diagnosis present

## 2018-11-23 DIAGNOSIS — M542 Cervicalgia: Secondary | ICD-10-CM | POA: Diagnosis not present

## 2018-11-23 DIAGNOSIS — Z8249 Family history of ischemic heart disease and other diseases of the circulatory system: Secondary | ICD-10-CM | POA: Diagnosis not present

## 2018-11-23 DIAGNOSIS — M4802 Spinal stenosis, cervical region: Secondary | ICD-10-CM | POA: Diagnosis not present

## 2018-11-23 DIAGNOSIS — Z87891 Personal history of nicotine dependence: Secondary | ICD-10-CM | POA: Insufficient documentation

## 2018-11-23 DIAGNOSIS — Z841 Family history of disorders of kidney and ureter: Secondary | ICD-10-CM | POA: Diagnosis not present

## 2018-11-23 DIAGNOSIS — G894 Chronic pain syndrome: Secondary | ICD-10-CM

## 2018-11-23 DIAGNOSIS — H9190 Unspecified hearing loss, unspecified ear: Secondary | ICD-10-CM | POA: Insufficient documentation

## 2018-11-23 DIAGNOSIS — E785 Hyperlipidemia, unspecified: Secondary | ICD-10-CM | POA: Diagnosis not present

## 2018-11-23 DIAGNOSIS — Z823 Family history of stroke: Secondary | ICD-10-CM | POA: Insufficient documentation

## 2018-11-23 DIAGNOSIS — Z87442 Personal history of urinary calculi: Secondary | ICD-10-CM | POA: Diagnosis not present

## 2018-11-23 DIAGNOSIS — M545 Low back pain: Secondary | ICD-10-CM

## 2018-11-23 DIAGNOSIS — Z79891 Long term (current) use of opiate analgesic: Secondary | ICD-10-CM

## 2018-11-23 DIAGNOSIS — M25511 Pain in right shoulder: Secondary | ICD-10-CM | POA: Insufficient documentation

## 2018-11-23 MED ORDER — OXYCODONE HCL 5 MG PO TABS: 5.0000 mg | ORAL_TABLET | Freq: Four times a day (QID) | ORAL | 0 refills | Status: DC | PRN

## 2018-11-23 NOTE — Progress Notes (Signed)
Subjective:    Patient ID: Fred [name], male    DOB: 07/03/1950, 68 y.o.   MRN: [id number]  HPI: [name] is a 68 y.o. male who returns for follow up appointment for chronic pain and medication refill. He states his pain is located in his neck and mid- lower back pain. He rates his pain 3. His current exercise regime is walking and performing stretching exercises.  Fred [name] Morphine equivalent is 30.00 MME.  Last Oral Swab was Performed on 10/22/2018, it was consistent.    Pain Inventory Average Pain 4 Pain Right Now 3 My pain is sharp, stabbing, tingling and aching  In the last 24 hours, has pain interfered with the following? General activity 4 Relation with others n/a Enjoyment of life n/a What TIME of day is your pain at its worst? daytime, evening Sleep (in general) Fair  Pain is worse with: walking, standing and some activites Pain improves with: rest, medication and TENS Relief from Meds: n/a  Mobility walk without assistance ability to climb steps?  yes do you drive?  yes  Function disabled: date disabled n/a  Neuro/Psych weakness numbness loss of taste or smell  Prior Studies no  Physicians involved in your care no   Family History  Problem Relation Age of Onset  . Stroke Mother   . Dementia Mother   . Heart disease Father   . Stroke Father        heat stroke  . Kidney disease Brother   . Dementia Brother    Social History   Socioeconomic History  . Marital status: Married    Spouse name: Not on file  . Number of children: 6  . Years of education: 12+  . Highest education level: Not on file  Occupational History  . Occupation: Retired  Scientific laboratory technician  . Financial resource strain: Not on file  . Food insecurity    Worry: Not on file    Inability: Not on file  . Transportation needs    Medical: Not on file    Non-medical: Not on file  Tobacco Use  . Smoking status: Former Smoker    Packs/day: 1.00    Years: 30.00   Pack years: 30.00    Types: Cigarettes    Quit date: 05/25/2001    Years since quitting: 17.5  . Smokeless tobacco: Former Systems developer    Types: Chew, Snuff    Quit date: 09/22/2001  Substance and Sexual Activity  . Alcohol use: Not Currently  . Drug use: No  . Sexual activity: Not on file  Lifestyle  . Physical activity    Days per week: Not on file    Minutes per session: Not on file  . Stress: Not on file  Relationships  . Social Herbalist on phone: Not on file    Gets together: Not on file    Attends religious service: Not on file    Active member of club or organization: Not on file    Attends meetings of clubs or organizations: Not on file    Relationship status: Not on file  Other Topics Concern  . Not on file  Social History Narrative   Lives with wife   Caffeine use: Coffee daily (1-2 per day)   Some soda   Right handed    Past Surgical History:  Procedure Laterality Date  . ANTERIOR CERVICAL DECOMP/DISCECTOMY FUSION  09-19-2010    dr Ronnald Ramp  @[facility]   . COLONOSCOPY W/  POLYPECTOMY  last one 03/ 2019  . CYSTOSCOPY N/A 05/17/2018   Procedure: CYSTOSCOPY/ REMOVAL OF BLADDER CALCULUS;  Surgeon: Fred Bring, MD;  [facility];  Service: Urology;  Laterality: N/A;  ONLY NEEDS 45 MIN  . FINGER SURGERY  1970s   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 RIGHT MIDDLE FINGER  . IMPLANTATION BONE ANCHORED HEARING AID Right 2011    @[facility]    "no longer have this"  . LYMPHADENECTOMY Bilateral 08/04/2013   Procedure: LYMPHADENECTOMY;  Surgeon: Fred Gray, MD;  [facility];  Service: Urology;  Laterality: Bilateral;  . PROSTATE SURGERY    . ROBOT ASSISTED LAPAROSCOPIC RADICAL PROSTATECTOMY N/A 08/04/2013   Procedure: ROBOTIC ASSISTED LAPAROSCOPIC RADICAL PROSTATECTOMY LEVEL 2;  Surgeon: Fred Gray, MD;  [facility];  Service: Urology;  Laterality: N/A;   Past Medical History:  Diagnosis Date  . Anticoagulated    xarelto  . Depression   . Gait  abnormality    MILD --- DOES NOT USE CANE  . 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 History of kidney stones   . 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 Hyperlipidemia   . Hypertension   . 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 Nocturia    twice  . Prostate cancer ([place])    UROLOGIST-- DR Fred [name]---  Stage T2a, Gleason 4+3, PSA 5.4,  s/p  radical prostatectomy 2015;   05-14-2018  per pt PSA undetectable  . Pulmonary embolism, bilateral ([place]) 12/02/2017   currently taking xarelto  . Wears dentures    upper  . Wears glasses   . Wears hearing aid in both ears    BP (!) 150/93 (BP Location: Left Arm, Patient Position: Sitting, Cuff Size: Normal)   Pulse (!) 57   Resp 12   Wt 185 lb (83.9 kg)   SpO2 97%   BMI 28.13 kg/m   Opioid Risk Score:   Fall Risk Score:  `1  Depression screen PHQ 2/9  Depression screen [facility] 2/9 10/22/2018 08/23/2018 04/05/2018 02/04/2018 11/03/2017 02/19/2017 12/12/2014  Decreased Interest 0 0 1 0 0 0 0  Down, Depressed, Hopeless 1 1 1  0 0 0 0  PHQ - 2 Score 1 1 2  0 0 0 0  Altered sleeping - - - - - - -  Tired, decreased energy - - - - - - -  Change in appetite - - - - - - -  Feeling bad or failure about yourself  - - - - - - -  Trouble concentrating - - - - - - -  Moving slowly or fidgety/restless - - - - - - -  Suicidal thoughts - - - - - - -  PHQ-9 Score - - - - - - -      Review of Systems  Respiratory: Positive for shortness of breath and wheezing.   All other systems reviewed and are negative.      Objective:   Physical Exam Vitals signs and nursing note reviewed.  Constitutional:      Appearance: Normal appearance.  Neck:     Musculoskeletal: Normal range of motion and neck supple.  Cardiovascular:     Rate and Rhythm: Normal rate and  regular rhythm.  Pulses: Normal pulses.     Heart sounds: Normal heart sounds.  Pulmonary:     Effort: Pulmonary effort is normal.     Breath sounds: Normal breath sounds.  Musculoskeletal:     Comments: Normal Muscle Bulk and Muscle Testing Reveals:  Upper Extremities: Full ROM and Muscle Strength 5/5 Thoracic Paraspinal Tenderness: T-7-T-9 Lumbar Paraspinal Tenderness: L-4-L-5 Lower Extremities: Full ROM and Muscle Strength 5/5 Arises from chair with ease Narrow Based Gait   Skin:    General: Skin is warm and dry.  Neurological:     Mental Status: He is alert and oriented to person, place, and time.  Psychiatric:        Mood and Affect: Mood normal.        Behavior: Behavior normal.           Assessment & Plan:  1.Cervical Postlaminectomy: Continue HEP and Continue current medication regime.11/23/2018. 2. Cervicalgia/ Cervical Radiculitis: Continue current medication regime. Continue to Monitor.11/23/2018 3. Chronic BilateralShoulder Pain:Continue HEP as Tolerated.11/23/2018 4. L2 compression fracture/ Chronic Bilateral Low Back Pain without Sciatica: Continue to Monitor:11/23/2018 Refilled:Oxycodone 5 mg one tablet every 6hours as needed for moderate pain. # 90. We will Continue the opioid monitoring program. This consists of regular clinic visits, examinations, urine drug screen, pill counts as well as use of [place] controlled substance reporting System. 5. Polyarthralgia: Continue to Monitor.07/07//2020  F/U in 1 month

## 2018-11-24 ENCOUNTER — Encounter: Payer: Self-pay | Admitting: Registered Nurse

## 2018-12-22 ENCOUNTER — Encounter: Payer: Self-pay | Admitting: Registered Nurse

## 2018-12-22 ENCOUNTER — Other Ambulatory Visit: Payer: Self-pay

## 2018-12-22 ENCOUNTER — Encounter: Payer: Medicare Other | Attending: Registered Nurse | Admitting: Registered Nurse

## 2018-12-22 VITALS — BP 143/92 | HR 60 | Temp 96.4°F | Resp 16 | Ht 67.0 in | Wt 182.4 lb

## 2018-12-22 DIAGNOSIS — M25512 Pain in left shoulder: Secondary | ICD-10-CM | POA: Insufficient documentation

## 2018-12-22 DIAGNOSIS — M5412 Radiculopathy, cervical region: Secondary | ICD-10-CM

## 2018-12-22 DIAGNOSIS — Z981 Arthrodesis status: Secondary | ICD-10-CM | POA: Insufficient documentation

## 2018-12-22 DIAGNOSIS — Z823 Family history of stroke: Secondary | ICD-10-CM | POA: Diagnosis not present

## 2018-12-22 DIAGNOSIS — G894 Chronic pain syndrome: Secondary | ICD-10-CM

## 2018-12-22 DIAGNOSIS — G8929 Other chronic pain: Secondary | ICD-10-CM | POA: Insufficient documentation

## 2018-12-22 DIAGNOSIS — E785 Hyperlipidemia, unspecified: Secondary | ICD-10-CM | POA: Diagnosis not present

## 2018-12-22 DIAGNOSIS — Z87442 Personal history of urinary calculi: Secondary | ICD-10-CM | POA: Insufficient documentation

## 2018-12-22 DIAGNOSIS — M961 Postlaminectomy syndrome, not elsewhere classified: Secondary | ICD-10-CM | POA: Diagnosis not present

## 2018-12-22 DIAGNOSIS — Z8249 Family history of ischemic heart disease and other diseases of the circulatory system: Secondary | ICD-10-CM | POA: Diagnosis not present

## 2018-12-22 DIAGNOSIS — M25511 Pain in right shoulder: Secondary | ICD-10-CM | POA: Insufficient documentation

## 2018-12-22 DIAGNOSIS — I10 Essential (primary) hypertension: Secondary | ICD-10-CM | POA: Insufficient documentation

## 2018-12-22 DIAGNOSIS — Z87891 Personal history of nicotine dependence: Secondary | ICD-10-CM | POA: Diagnosis not present

## 2018-12-22 DIAGNOSIS — M542 Cervicalgia: Secondary | ICD-10-CM | POA: Insufficient documentation

## 2018-12-22 DIAGNOSIS — Z79891 Long term (current) use of opiate analgesic: Secondary | ICD-10-CM

## 2018-12-22 DIAGNOSIS — Z5181 Encounter for therapeutic drug level monitoring: Secondary | ICD-10-CM

## 2018-12-22 DIAGNOSIS — Z841 Family history of disorders of kidney and ureter: Secondary | ICD-10-CM | POA: Diagnosis not present

## 2018-12-22 DIAGNOSIS — M4802 Spinal stenosis, cervical region: Secondary | ICD-10-CM | POA: Diagnosis not present

## 2018-12-22 DIAGNOSIS — H9190 Unspecified hearing loss, unspecified ear: Secondary | ICD-10-CM | POA: Insufficient documentation

## 2018-12-22 MED ORDER — OXYCODONE HCL 5 MG PO TABS: 5.0000 mg | ORAL_TABLET | Freq: Four times a day (QID) | ORAL | 0 refills | Status: DC | PRN

## 2018-12-22 NOTE — Progress Notes (Signed)
Subjective:    Patient ID: Fred [name], male    DOB: 25-Jan-1951, 68 y.o.   MRN: [id number]  HPI: Fred [name] is a 68 y.o. male who returns for follow up appointment for chronic pain and medication refill. He states his pain is located in his neck radiating into his bilateral shoulders and upper back. He rates his pain 4. His current exercise regime is walking and performing stretching exercises.  [name] Morphine equivalent is 30.00MME.  Last Oral Swab was performed on 10/22/2018, it was consistent.   Pain Inventory Average Pain 5 Pain Right Now 4 My pain is na  In the last 24 hours, has pain interfered with the following? General activity 4 Relation with others 5 Enjoyment of life 5 What TIME of day is your pain at its worst? evening Sleep (in general) Fair  Pain is worse with: walking, bending, sitting and standing Pain improves with: rest, heat/ice, medication and TENS Relief from Meds: na  Mobility walk without assistance  Function disabled: date disabled 2000  Neuro/Psych loss of taste or smell  Prior Studies Any changes since last visit?  no  Physicians involved in your care Any changes since last visit?  no   Family History  Problem Relation Age of Onset  . Stroke Mother   . Dementia Mother   . Heart disease Father   . Stroke Father        heat stroke  . Kidney disease Brother   . Dementia Brother    Social History   Socioeconomic History  . Marital status: Married    Spouse name: Not on file  . Number of children: 6  . Years of education: 12+  . Highest education level: Not on file  Occupational History  . Occupation: Retired  Scientific laboratory technician  . Financial resource strain: Not on file  . Food insecurity    Worry: Not on file    Inability: Not on file  . Transportation needs    Medical: Not on file    Non-medical: Not on file  Tobacco Use  . Smoking status: Former Smoker    Packs/day: 1.00    Years: 30.00    Pack years: 30.00     Types: Cigarettes    Quit date: 05/25/2001    Years since quitting: 17.5  . Smokeless tobacco: Former Systems developer    Types: Chew, Snuff    Quit date: 09/22/2001  Substance and Sexual Activity  . Alcohol use: Not Currently  . Drug use: No  . Sexual activity: Not on file  Lifestyle  . Physical activity    Days per week: Not on file    Minutes per session: Not on file  . Stress: Not on file  Relationships  . Social Herbalist on phone: Not on file    Gets together: Not on file    Attends religious service: Not on file    Active member of club or organization: Not on file    Attends meetings of clubs or organizations: Not on file    Relationship status: Not on file  Other Topics Concern  . Not on file  Social History Narrative   Lives with wife   Caffeine use: Coffee daily (1-2 per day)   Some soda   Right handed    Past Surgical History:  Procedure Laterality Date  . ANTERIOR CERVICAL DECOMP/DISCECTOMY FUSION  09-19-2010    dr Ronnald Ramp  @[facility]   . COLONOSCOPY W/ POLYPECTOMY  last one 03/ 2019  . CYSTOSCOPY N/A 05/17/2018   Procedure: CYSTOSCOPY/ REMOVAL OF BLADDER CALCULUS;  Surgeon: [name], MD;  [facility];  Service: Urology;  Laterality: N/A;  ONLY NEEDS 45 MIN  . FINGER SURGERY  1970s   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 RIGHT MIDDLE FINGER  . IMPLANTATION BONE ANCHORED HEARING AID Right 2011    @[facility]    "no longer have this"  . LYMPHADENECTOMY Bilateral 08/04/2013   Procedure: LYMPHADENECTOMY;  Surgeon: Dutch Gray, MD;  [facility];  Service: Urology;  Laterality: Bilateral;  . PROSTATE SURGERY    . ROBOT ASSISTED LAPAROSCOPIC RADICAL PROSTATECTOMY N/A 08/04/2013   Procedure: ROBOTIC ASSISTED LAPAROSCOPIC RADICAL PROSTATECTOMY LEVEL 2;  Surgeon: Dutch Gray, MD;  [facility];  Service: Urology;  Laterality: N/A;   Past Medical History:  Diagnosis Date  . Anticoagulated    xarelto  . Depression   . Gait abnormality     MILD --- DOES NOT USE CANE  . 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 History of kidney stones   . 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 Hyperlipidemia   . Hypertension   . 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 Nocturia    twice  . Prostate cancer ([place])    UROLOGIST-- DR [name]---  Stage T2a, Gleason 4+3, PSA 5.4,  s/p  radical prostatectomy 2015;   05-14-2018  per pt PSA undetectable  . Pulmonary embolism, bilateral ([place]) 12/02/2017   currently taking xarelto  . Wears dentures    upper  . Wears glasses   . Wears hearing aid in both ears    There were no vitals taken for this visit.  Opioid Risk Score:   Fall Risk Score:  `1  Depression screen PHQ 2/9  Depression screen [facility] 2/9 10/22/2018 08/23/2018 04/05/2018 02/04/2018 11/03/2017 02/19/2017 12/12/2014  Decreased Interest 0 0 1 0 0 0 0  Down, Depressed, Hopeless 1 1 1  0 0 0 0  PHQ - 2 Score 1 1 2  0 0 0 0  Altered sleeping - - - - - - -  Tired, decreased energy - - - - - - -  Change in appetite - - - - - - -  Feeling bad or failure about yourself  - - - - - - -  Trouble concentrating - - - - - - -  Moving slowly or fidgety/restless - - - - - - -  Suicidal thoughts - - - - - - -  PHQ-9 Score - - - - - - -     Review of Systems  Constitutional: Negative.   HENT: Negative.   Eyes: Negative.   Respiratory: Negative.   Cardiovascular: Negative.   Gastrointestinal: Negative.   Endocrine: Negative.   Genitourinary: Negative.   Musculoskeletal: Positive for back pain and neck pain.  Skin: Negative.   Allergic/Immunologic: Negative.   Neurological: Negative.   Psychiatric/Behavioral: Negative.   All other systems reviewed and are negative.      Objective:   Physical Exam Vitals signs and nursing note reviewed.   Constitutional:      Appearance: Normal appearance.  Neck:     Musculoskeletal: Normal range of motion and neck supple.  Cardiovascular:     Rate and Rhythm: Normal rate and regular rhythm.     Pulses: Normal pulses.     Heart sounds: Normal heart sounds.  Pulmonary:     Effort: Pulmonary effort is normal.     Breath sounds: Normal breath sounds.  Musculoskeletal:     Comments: Normal Muscle Bulk and Muscle Testing Reveals:  Upper Extremities: Full ROM and Muscle Strength 5/5 Thoracic Paraspinal Tenderness: T-1-T-3   Lower Extremities: Full ROM and Muscle Strength 5/5 Arises from chair with ease Narrow Based  Gait   Skin:    General: Skin is warm and dry.  Neurological:     Mental Status: He is alert and oriented to person, place, and time.  Psychiatric:        Mood and Affect: Mood normal.        Behavior: Behavior normal.           Assessment & Plan:  1.Cervical Postlaminectomy: Continue HEP and Continue current medication regime.12/22/2018. 2. Cervicalgia/ Cervical Radiculitis: Continue current medication regime. Continue to Monitor.12/22/2018 3. Chronic BilateralShoulder Pain:Continue HEP as Tolerated.12/22/2018 4. L2 compression fracture/ Chronic Bilateral Low Back Pain without Sciatica: Continue to Monitor:12/22/2018 Refilled:Oxycodone 5 mg one tablet every 6hours as needed for moderate pain. # 90. We will Continue the opioid monitoring program. This consists of regular clinic visits, examinations, urine drug screen, pill counts as well as use of [place] controlled substance reporting System. 5. Polyarthralgia: Continue to Monitor.08/05//2020  15   minutes of face to face patient care time was spent during this visit. All questions were encouraged and answered.  F/U in 1 month

## 2019-01-25 ENCOUNTER — Encounter: Payer: Self-pay | Admitting: Registered Nurse

## 2019-01-25 ENCOUNTER — Encounter: Payer: Medicare Other | Attending: Registered Nurse | Admitting: Registered Nurse

## 2019-01-25 ENCOUNTER — Other Ambulatory Visit: Payer: Self-pay

## 2019-01-25 VITALS — BP 143/89 | HR 60 | Temp 98.3°F | Ht 67.0 in | Wt 182.2 lb

## 2019-01-25 DIAGNOSIS — Z823 Family history of stroke: Secondary | ICD-10-CM | POA: Insufficient documentation

## 2019-01-25 DIAGNOSIS — E785 Hyperlipidemia, unspecified: Secondary | ICD-10-CM | POA: Diagnosis not present

## 2019-01-25 DIAGNOSIS — M545 Low back pain: Secondary | ICD-10-CM

## 2019-01-25 DIAGNOSIS — G8929 Other chronic pain: Secondary | ICD-10-CM | POA: Diagnosis not present

## 2019-01-25 DIAGNOSIS — M961 Postlaminectomy syndrome, not elsewhere classified: Secondary | ICD-10-CM | POA: Diagnosis not present

## 2019-01-25 DIAGNOSIS — Z87442 Personal history of urinary calculi: Secondary | ICD-10-CM | POA: Diagnosis not present

## 2019-01-25 DIAGNOSIS — Z5181 Encounter for therapeutic drug level monitoring: Secondary | ICD-10-CM

## 2019-01-25 DIAGNOSIS — Z841 Family history of disorders of kidney and ureter: Secondary | ICD-10-CM | POA: Insufficient documentation

## 2019-01-25 DIAGNOSIS — G894 Chronic pain syndrome: Secondary | ICD-10-CM

## 2019-01-25 DIAGNOSIS — M25511 Pain in right shoulder: Secondary | ICD-10-CM | POA: Insufficient documentation

## 2019-01-25 DIAGNOSIS — Z87891 Personal history of nicotine dependence: Secondary | ICD-10-CM | POA: Diagnosis not present

## 2019-01-25 DIAGNOSIS — I10 Essential (primary) hypertension: Secondary | ICD-10-CM | POA: Diagnosis not present

## 2019-01-25 DIAGNOSIS — M542 Cervicalgia: Secondary | ICD-10-CM | POA: Diagnosis not present

## 2019-01-25 DIAGNOSIS — Z8249 Family history of ischemic heart disease and other diseases of the circulatory system: Secondary | ICD-10-CM | POA: Insufficient documentation

## 2019-01-25 DIAGNOSIS — Z981 Arthrodesis status: Secondary | ICD-10-CM | POA: Insufficient documentation

## 2019-01-25 DIAGNOSIS — M25512 Pain in left shoulder: Secondary | ICD-10-CM | POA: Diagnosis not present

## 2019-01-25 DIAGNOSIS — M5412 Radiculopathy, cervical region: Secondary | ICD-10-CM

## 2019-01-25 DIAGNOSIS — M4802 Spinal stenosis, cervical region: Secondary | ICD-10-CM | POA: Diagnosis not present

## 2019-01-25 DIAGNOSIS — Z79891 Long term (current) use of opiate analgesic: Secondary | ICD-10-CM

## 2019-01-25 DIAGNOSIS — M546 Pain in thoracic spine: Secondary | ICD-10-CM

## 2019-01-25 DIAGNOSIS — H9190 Unspecified hearing loss, unspecified ear: Secondary | ICD-10-CM | POA: Diagnosis not present

## 2019-01-25 MED ORDER — OXYCODONE HCL 5 MG PO TABS: 5.0000 mg | ORAL_TABLET | Freq: Four times a day (QID) | ORAL | 0 refills | Status: DC | PRN

## 2019-01-25 NOTE — Progress Notes (Signed)
Subjective:    Patient ID: Fred [name], male    DOB: [birth year], 68 y.o.   MRN: [id number]  HPI: Fred [name] is a 68 y.o. male who returns for follow up appointment for chronic pain and medication refill. He states his pain is located in his neck radiating into his bilateral shoulders and upper- lower back pain. He rates his pain 4. His current exercise regime is walking and performing stretching exercises.  [name] Morphine equivalent is 30.00  MME.  Last Oral Swab was Performed on 10/01/2017, it was consistent.   Pain Inventory Average Pain 4 Pain Right Now 4 My pain is constant, sharp, stabbing, tingling and aching  In the last 24 hours, has pain interfered with the following? General activity 5 Relation with others 5 Enjoyment of life 6 What TIME of day is your pain at its worst? Sleep (in general) Fair  Pain is worse with: bending, standing and some activites Pain improves with: n/a Relief from Meds: 3  Mobility how many minutes can you walk? 10+ ability to climb steps?  yes do you drive?  yes transfers alone Do you have any goals in this area?  yes  Function disabled: date disabled 2000 I need assistance with the following:  meal prep, household duties and shopping  Neuro/Psych numbness tremor tingling depression loss of taste or smell  Prior Studies Any changes since last visit?  no  Physicians involved in your care Any changes since last visit?  no   Family History  Problem Relation Age of Onset  . Stroke Mother   . Dementia Mother   . Heart disease Father   . Stroke Father        heat stroke  . Kidney disease Brother   . Dementia Brother    Social History   Socioeconomic History  . Marital status: Married    Spouse name: Not on file  . Number of children: 6  . Years of education: 12+  . Highest education level: Not on file  Occupational History  . Occupation: Retired  Scientific laboratory technician  . Financial resource strain: Not on file   . Food insecurity    Worry: Not on file    Inability: Not on file  . Transportation needs    Medical: Not on file    Non-medical: Not on file  Tobacco Use  . Smoking status: Former Smoker    Packs/day: 1.00    Years: 30.00    Pack years: 30.00    Types: Cigarettes    Quit date: 05/25/2001    Years since quitting: 17.6  . Smokeless tobacco: Former Systems developer    Types: Chew, Snuff    Quit date: 09/22/2001  Substance and Sexual Activity  . Alcohol use: Not Currently  . Drug use: No  . Sexual activity: Not on file  Lifestyle  . Physical activity    Days per week: Not on file    Minutes per session: Not on file  . Stress: Not on file  Relationships  . Social Herbalist on phone: Not on file    Gets together: Not on file    Attends religious service: Not on file    Active member of club or organization: Not on file    Attends meetings of clubs or organizations: Not on file    Relationship status: Not on file  Other Topics Concern  . Not on file  Social History Narrative   Lives with wife  Caffeine use: Coffee daily (1-2 per day)   Some soda   Right handed    Past Surgical History:  Procedure Laterality Date  . ANTERIOR CERVICAL DECOMP/DISCECTOMY FUSION  09-19-2010    Fred [name]  @[facility]   . COLONOSCOPY W/ POLYPECTOMY  last one 03/ 2019  . CYSTOSCOPY N/A 05/17/2018   Procedure: CYSTOSCOPY/ REMOVAL OF BLADDER CALCULUS;  Surgeon: Fred [name];  Location: [facility];  Service: Urology;  Laterality: N/A;  ONLY NEEDS 45 MIN  . FINGER SURGERY  1970s   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 RIGHT MIDDLE FINGER  . IMPLANTATION BONE ANCHORED HEARING AID Right 2011    @Fred [name]    "no longer have this"  . LYMPHADENECTOMY Bilateral 08/04/2013   Procedure: LYMPHADENECTOMY;  Surgeon: Fred [name];  [facility];  Service: Urology;  Laterality: Bilateral;  . PROSTATE SURGERY    . ROBOT ASSISTED LAPAROSCOPIC RADICAL PROSTATECTOMY N/A 08/04/2013   Procedure:  ROBOTIC ASSISTED LAPAROSCOPIC RADICAL PROSTATECTOMY LEVEL 2;  Surgeon: Fred [name];  [facility];  Service: Urology;  Laterality: N/A;   Past Medical History:  Diagnosis Date  . Anticoagulated    xarelto  . Depression   . Gait abnormality    MILD --- DOES NOT USE CANE  . 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 History of kidney stones   . 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 Hyperlipidemia   . Hypertension   . 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 Nocturia    twice  . Prostate cancer ([place])    UROLOGIST-- Fred Fred [name]---  Stage T2a, Gleason 4+3, PSA 5.4,  s/p  radical prostatectomy 2015;   05-14-2018  per pt PSA undetectable  . Pulmonary embolism, bilateral ([place]) 12/02/2017   currently taking xarelto  . Wears dentures    upper  . Wears glasses   . Wears hearing aid in both ears    BP (!) 143/89   Pulse (!) 51   Temp 98.3 F (36.8 C)   Ht 5\' 7"  (1.702 m)   Wt 182 lb 3.2 oz (82.6 kg)   SpO2 97%   BMI 28.54 kg/m   Opioid Risk Score:   Fall Risk Score:  `1  Depression screen PHQ 2/9  Depression screen [facility] 2/9 10/22/2018 08/23/2018 04/05/2018 02/04/2018 11/03/2017 02/19/2017 12/12/2014  Decreased Interest 0 0 1 0 0 0 0  Down, Depressed, Hopeless 1 1 1  0 0 0 0  PHQ - 2 Score 1 1 2  0 0 0 0  Altered sleeping - - - - - - -  Tired, decreased energy - - - - - - -  Change in appetite - - - - - - -  Feeling bad or failure about yourself  - - - - - - -  Trouble concentrating - - - - - - -  Moving slowly or fidgety/restless - - - - - - -  Suicidal thoughts - - - - - - -  PHQ-9 Score - - - - - - -    Review of Systems  Constitutional: Positive for appetite change.  HENT: Negative.   Eyes: Negative.   Respiratory: Negative.   Cardiovascular: Negative.   Gastrointestinal:  Negative.   Endocrine:  Negative.   Genitourinary: Negative.   Musculoskeletal: Negative.   Skin: Negative.   Allergic/Immunologic: Negative.   Neurological: Positive for tremors and numbness.  Hematological: Negative.   Psychiatric/Behavioral: Positive for dysphoric mood.  All other systems reviewed and are negative.      Objective:   Physical Exam Vitals signs and nursing note reviewed.  Constitutional:      Appearance: Normal appearance.  Neck:     Musculoskeletal: Normal range of motion and neck supple.     Comments: Cervical Paraspinal Tenderness: C-5-C-6 Cardiovascular:     Rate and Rhythm: Normal rate and regular rhythm.     Pulses: Normal pulses.     Heart sounds: Normal heart sounds.  Pulmonary:     Effort: Pulmonary effort is normal.     Breath sounds: Normal breath sounds.  Musculoskeletal:     Comments: Normal Muscle Bulk and Muscle Testing Reveals:  Upper Extremities: Full ROM and Muscle Strength 5/5 Bilateral AC Joint Tenderness Lower Extremities : Full ROM and Muscle Strength 5/5 Arises from chair with ease Narrow Based Gait   Skin:    General: Skin is warm and dry.  Neurological:     Mental Status: He is alert and oriented to person, place, and time.  Psychiatric:        Mood and Affect: Mood normal.        Behavior: Behavior normal.           Assessment & Plan:  1.Cervical Postlaminectomy: Continue HEP and Continue current medication regime.01/25/2019. 2. Cervicalgia/ Cervical Radiculitis: Continue current medication regime. Continue to Monitor.09/85/2020 3. Chronic BilateralShoulder Pain:Continue HEP as Tolerated.01/25/2019 4. L2 compression fracture/ Chronic Bilateral Low Back Pain without Sciatica: Continue to Monitor:01/25/2019 Refilled:Oxycodone 5 mg one tablet every 6hours as needed for moderate pain. # 90. We will Continue the opioid monitoring program. This consists of regular clinic visits, examinations, urine drug screen,  pill counts as well as use of [place] controlled substance reporting System. 5. Polyarthralgia: Continue to Monitor.09/08//2020  15   minutes of face to face patient care time was spent during this visit. All questions were encouraged and answered.  F/U in 1 month

## 2019-02-28 ENCOUNTER — Encounter: Payer: Self-pay | Admitting: Registered Nurse

## 2019-02-28 ENCOUNTER — Other Ambulatory Visit: Payer: Self-pay

## 2019-02-28 ENCOUNTER — Encounter: Payer: Medicare Other | Attending: Registered Nurse | Admitting: Registered Nurse

## 2019-02-28 VITALS — BP 154/91 | HR 62 | Temp 97.8°F | Ht 68.0 in | Wt 184.0 lb

## 2019-02-28 DIAGNOSIS — M542 Cervicalgia: Secondary | ICD-10-CM | POA: Insufficient documentation

## 2019-02-28 DIAGNOSIS — E785 Hyperlipidemia, unspecified: Secondary | ICD-10-CM | POA: Insufficient documentation

## 2019-02-28 DIAGNOSIS — M25511 Pain in right shoulder: Secondary | ICD-10-CM | POA: Diagnosis not present

## 2019-02-28 DIAGNOSIS — Z823 Family history of stroke: Secondary | ICD-10-CM | POA: Diagnosis not present

## 2019-02-28 DIAGNOSIS — Z981 Arthrodesis status: Secondary | ICD-10-CM | POA: Insufficient documentation

## 2019-02-28 DIAGNOSIS — M5412 Radiculopathy, cervical region: Secondary | ICD-10-CM | POA: Diagnosis not present

## 2019-02-28 DIAGNOSIS — M961 Postlaminectomy syndrome, not elsewhere classified: Secondary | ICD-10-CM | POA: Diagnosis not present

## 2019-02-28 DIAGNOSIS — Z87891 Personal history of nicotine dependence: Secondary | ICD-10-CM | POA: Diagnosis not present

## 2019-02-28 DIAGNOSIS — M545 Low back pain, unspecified: Secondary | ICD-10-CM

## 2019-02-28 DIAGNOSIS — Z5181 Encounter for therapeutic drug level monitoring: Secondary | ICD-10-CM

## 2019-02-28 DIAGNOSIS — Z841 Family history of disorders of kidney and ureter: Secondary | ICD-10-CM | POA: Insufficient documentation

## 2019-02-28 DIAGNOSIS — Z8249 Family history of ischemic heart disease and other diseases of the circulatory system: Secondary | ICD-10-CM | POA: Diagnosis not present

## 2019-02-28 DIAGNOSIS — I10 Essential (primary) hypertension: Secondary | ICD-10-CM | POA: Diagnosis not present

## 2019-02-28 DIAGNOSIS — H9190 Unspecified hearing loss, unspecified ear: Secondary | ICD-10-CM | POA: Insufficient documentation

## 2019-02-28 DIAGNOSIS — G894 Chronic pain syndrome: Secondary | ICD-10-CM

## 2019-02-28 DIAGNOSIS — Z87442 Personal history of urinary calculi: Secondary | ICD-10-CM | POA: Diagnosis not present

## 2019-02-28 DIAGNOSIS — M25512 Pain in left shoulder: Secondary | ICD-10-CM | POA: Diagnosis not present

## 2019-02-28 DIAGNOSIS — G8929 Other chronic pain: Secondary | ICD-10-CM | POA: Insufficient documentation

## 2019-02-28 DIAGNOSIS — M4802 Spinal stenosis, cervical region: Secondary | ICD-10-CM

## 2019-02-28 DIAGNOSIS — Z79891 Long term (current) use of opiate analgesic: Secondary | ICD-10-CM

## 2019-02-28 MED ORDER — OXYCODONE HCL 5 MG PO TABS: 5.0000 mg | ORAL_TABLET | Freq: Four times a day (QID) | ORAL | 0 refills | Status: DC | PRN

## 2019-02-28 NOTE — Progress Notes (Signed)
Subjective:    Patient ID: Fred [name], male    DOB: [birth year], 68 y.o.   MRN: [id number]  HPI: Fred [name] is a 68 y.o. male who returns for follow up appointment for chronic pain and medication refill. He states his pain is located in his neck radiating into his left shoulder and lower back pain. He rates his pain 3. His current exercise regime is walking and performing stretching exercises.   Fred [name] Morphine equivalent is 30.00 MME.  Last Oral Swab was Performed on 10/21/2017, it was consistent.   Pain Inventory Average Pain 5 Pain Right Now 3 My pain is sharp, stabbing, tingling and aching  In the last 24 hours, has pain interfered with the following? General activity 5 Relation with others 6 Enjoyment of life 0 What TIME of day is your pain at its worst? evening Sleep (in general) Fair  Pain is worse with: walking, bending, sitting and standing Pain improves with: medication Relief from Meds: 5  Mobility walk without assistance  Function disabled: date disabled .  Neuro/Psych No problems in this area  Prior Studies Any changes since last visit?  no  Physicians involved in your care Any changes since last visit?  no   Family History  Problem Relation Age of Onset  . Stroke Mother   . Dementia Mother   . Heart disease Father   . Stroke Father        heat stroke  . Kidney disease Brother   . Dementia Brother    Social History   Socioeconomic History  . Marital status: Married    Spouse name: Not on file  . Number of children: 6  . Years of education: 12+  . Highest education level: Not on file  Occupational History  . Occupation: Retired  Scientific laboratory technician  . Financial resource strain: Not on file  . Food insecurity    Worry: Not on file    Inability: Not on file  . Transportation needs    Medical: Not on file    Non-medical: Not on file  Tobacco Use  . Smoking status: Former Smoker    Packs/day: 1.00    Years: 30.00    Pack  years: 30.00    Types: Cigarettes    Quit date: 05/25/2001    Years since quitting: 17.7  . Smokeless tobacco: Former Systems developer    Types: Chew, Snuff    Quit date: 09/22/2001  Substance and Sexual Activity  . Alcohol use: Not Currently  . Drug use: No  . Sexual activity: Not on file  Lifestyle  . Physical activity    Days per week: Not on file    Minutes per session: Not on file  . Stress: Not on file  Relationships  . Social Herbalist on phone: Not on file    Gets together: Not on file    Attends religious service: Not on file    Active member of club or organization: Not on file    Attends meetings of clubs or organizations: Not on file    Relationship status: Not on file  Other Topics Concern  . Not on file  Social History Narrative   Lives with wife   Caffeine use: Coffee daily (1-2 per day)   Some soda   Right handed    Past Surgical History:  Procedure Laterality Date  . ANTERIOR CERVICAL DECOMP/DISCECTOMY FUSION  09-19-2010    dr Ronnald Ramp  @[facility]   . COLONOSCOPY  W/ POLYPECTOMY  last one 03/ 2019  . CYSTOSCOPY N/A 05/17/2018   Procedure: CYSTOSCOPY/ REMOVAL OF BLADDER CALCULUS;  Surgeon: [name], MD;  [facility];  Service: Urology;  Laterality: N/A;  ONLY NEEDS 45 MIN  . FINGER SURGERY  1970s   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 RIGHT MIDDLE FINGER  . IMPLANTATION BONE ANCHORED HEARING AID Right 2011    @[facility]    "no longer have this"  . LYMPHADENECTOMY Bilateral 08/04/2013   Procedure: LYMPHADENECTOMY;  Surgeon: Dutch Gray, MD;  [facility];  Service: Urology;  Laterality: Bilateral;  . PROSTATE SURGERY    . ROBOT ASSISTED LAPAROSCOPIC RADICAL PROSTATECTOMY N/A 08/04/2013   Procedure: ROBOTIC ASSISTED LAPAROSCOPIC RADICAL PROSTATECTOMY LEVEL 2;  Surgeon: Dutch Gray, MD;  [facility];  Service: Urology;  Laterality: N/A;   Past Medical History:  Diagnosis Date  . Anticoagulated    xarelto  . Depression   . Gait  abnormality    MILD --- DOES NOT USE CANE  . 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 History of kidney stones   . 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 Hyperlipidemia   . Hypertension   . 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 Nocturia    twice  . Prostate cancer ([place])    UROLOGIST-- DR [name]---  Stage T2a, Gleason 4+3, PSA 5.4,  s/p  radical prostatectomy 2015;   05-14-2018  per pt PSA undetectable  . Pulmonary embolism, bilateral ([place]) 12/02/2017   currently taking xarelto  . Wears dentures    upper  . Wears glasses   . Wears hearing aid in both ears    BP (!) 154/91   Pulse 62   Temp 97.8 F (36.6 C)   Ht 5\' 8"  (1.727 m)   Wt 184 lb (83.5 kg)   SpO2 97%   BMI 27.98 kg/m   Opioid Risk Score:   Fall Risk Score:  `1  Depression screen PHQ 2/9  Depression screen [facility] 2/9 10/22/2018 08/23/2018 04/05/2018 02/04/2018 11/03/2017 02/19/2017 12/12/2014  Decreased Interest 0 0 1 0 0 0 0  Down, Depressed, Hopeless 1 1 1  0 0 0 0  PHQ - 2 Score 1 1 2  0 0 0 0  Altered sleeping - - - - - - -  Tired, decreased energy - - - - - - -  Change in appetite - - - - - - -  Feeling bad or failure about yourself  - - - - - - -  Trouble concentrating - - - - - - -  Moving slowly or fidgety/restless - - - - - - -  Suicidal thoughts - - - - - - -  PHQ-9 Score - - - - - - -     Review of Systems  Constitutional: Negative.   HENT: Negative.   Eyes: Negative.   Respiratory: Negative.   Cardiovascular: Negative.   Gastrointestinal: Negative.   Endocrine: Negative.   Genitourinary: Negative.   Musculoskeletal: Positive for arthralgias, back pain and myalgias.  Skin: Negative.   Allergic/Immunologic: Negative.   Neurological: Negative.   Hematological: Negative.   Psychiatric/Behavioral:  Negative.   All other systems reviewed and are negative.  Objective:   Physical Exam Constitutional:      Appearance: Normal appearance.  Neck:     Musculoskeletal: Normal range of motion and neck supple.     Comments: Cervical Paraspinal Tenderness: C-5-C-6  Cardiovascular:     Rate and Rhythm: Normal rate and regular rhythm.     Pulses: Normal pulses.     Heart sounds: Normal heart sounds.  Pulmonary:     Effort: Pulmonary effort is normal.     Breath sounds: Normal breath sounds.  Musculoskeletal:     Comments: Normal Muscle Bulk and Muscle Testing Reveals:  Upper Extremities: Full ROM and Muscle Strength 5/5 Bilateral AC Joint Tenderness Lumbar Paraspinal Tenderness: L-3-L-5  Lower Extremities: Full ROM and Muscle Strength 5/5 Arises from chair with ease Narrow Based Gait   Skin:    General: Skin is warm and dry.  Neurological:     Mental Status: He is alert and oriented to person, place, and time.  Psychiatric:        Mood and Affect: Mood normal.        Behavior: Behavior normal.           Assessment & Plan:  1.Cervical Postlaminectomy: Continue HEP and Continue current medication regime.02/28/2019. 2. Cervicalgia/ Cervical Radiculitis: Continue current medication regime. Continue to Monitor.02/28/2019 3. Chronic BilateralShoulder Pain:Continue HEP as Tolerated.02/28/2019 4. L2 compression fracture/ Chronic Bilateral Low Back Pain without Sciatica: Continue to Monitor:02/28/2019 Refilled:Oxycodone 5 mg one tablet every 6hours as needed for moderate pain. # 120. We will Continue the opioid monitoring program. This consists of regular clinic visits, examinations, urine drug screen, pill counts as well as use of [place] controlled substance reporting System. 5. Polyarthralgia: No complaints today. Continue to Monitor.10/12//2020  66minutes of face to face patient care time was spent during this visit. All questions were encouraged and  answered.  F/U in 1 month

## 2019-04-04 ENCOUNTER — Encounter: Payer: Self-pay | Admitting: Registered Nurse

## 2019-04-04 ENCOUNTER — Other Ambulatory Visit: Payer: Self-pay

## 2019-04-04 ENCOUNTER — Encounter: Payer: Medicare Other | Attending: Registered Nurse | Admitting: Registered Nurse

## 2019-04-04 VITALS — BP 134/84 | HR 61 | Temp 97.3°F | Ht 68.0 in | Wt 190.0 lb

## 2019-04-04 DIAGNOSIS — M961 Postlaminectomy syndrome, not elsewhere classified: Secondary | ICD-10-CM | POA: Diagnosis not present

## 2019-04-04 DIAGNOSIS — M542 Cervicalgia: Secondary | ICD-10-CM | POA: Diagnosis not present

## 2019-04-04 DIAGNOSIS — H9190 Unspecified hearing loss, unspecified ear: Secondary | ICD-10-CM | POA: Insufficient documentation

## 2019-04-04 DIAGNOSIS — Z841 Family history of disorders of kidney and ureter: Secondary | ICD-10-CM | POA: Insufficient documentation

## 2019-04-04 DIAGNOSIS — Z823 Family history of stroke: Secondary | ICD-10-CM | POA: Insufficient documentation

## 2019-04-04 DIAGNOSIS — M5412 Radiculopathy, cervical region: Secondary | ICD-10-CM

## 2019-04-04 DIAGNOSIS — Z981 Arthrodesis status: Secondary | ICD-10-CM | POA: Diagnosis not present

## 2019-04-04 DIAGNOSIS — M546 Pain in thoracic spine: Secondary | ICD-10-CM

## 2019-04-04 DIAGNOSIS — E785 Hyperlipidemia, unspecified: Secondary | ICD-10-CM | POA: Diagnosis not present

## 2019-04-04 DIAGNOSIS — M4802 Spinal stenosis, cervical region: Secondary | ICD-10-CM

## 2019-04-04 DIAGNOSIS — M25511 Pain in right shoulder: Secondary | ICD-10-CM | POA: Diagnosis not present

## 2019-04-04 DIAGNOSIS — Z8249 Family history of ischemic heart disease and other diseases of the circulatory system: Secondary | ICD-10-CM | POA: Insufficient documentation

## 2019-04-04 DIAGNOSIS — Z87891 Personal history of nicotine dependence: Secondary | ICD-10-CM | POA: Insufficient documentation

## 2019-04-04 DIAGNOSIS — Z87442 Personal history of urinary calculi: Secondary | ICD-10-CM | POA: Insufficient documentation

## 2019-04-04 DIAGNOSIS — Z79891 Long term (current) use of opiate analgesic: Secondary | ICD-10-CM

## 2019-04-04 DIAGNOSIS — G8929 Other chronic pain: Secondary | ICD-10-CM

## 2019-04-04 DIAGNOSIS — M545 Low back pain: Secondary | ICD-10-CM

## 2019-04-04 DIAGNOSIS — G894 Chronic pain syndrome: Secondary | ICD-10-CM

## 2019-04-04 DIAGNOSIS — M25512 Pain in left shoulder: Secondary | ICD-10-CM | POA: Insufficient documentation

## 2019-04-04 DIAGNOSIS — Z5181 Encounter for therapeutic drug level monitoring: Secondary | ICD-10-CM

## 2019-04-04 DIAGNOSIS — I10 Essential (primary) hypertension: Secondary | ICD-10-CM | POA: Diagnosis not present

## 2019-04-04 MED ORDER — OXYCODONE HCL 5 MG PO TABS: 5.0000 mg | ORAL_TABLET | Freq: Four times a day (QID) | ORAL | 0 refills | Status: DC | PRN

## 2019-04-04 NOTE — Progress Notes (Signed)
Subjective:    Patient ID: Fred [name], male    DOB: August 06, 1950, 68 y.o.   MRN: [id number]  HPI: Fred [name] is a 68 y.o. male who returns for follow up appointment for chronic pain and medication refill. He states his pain is located in his neck radiating into his bilateral shoulders and mid- lower back pain. He rates his pain 5. His current exercise regime is walking and performing stretching exercises.  Fred [name] Morphine equivalent is 30.00 MME.  Last Oral Swab was Performed on 10/22/2018, it was consistent.    Pain Inventory Average Pain 5 Pain Right Now 5 My pain is intermittent  In the last 24 hours, has pain interfered with the following? General activity 0 Relation with others 7 Enjoyment of life 6 What TIME of day is your pain at its worst? daytime, evening Sleep (in general) Fair  Pain is worse with: walking, bending, standing and unsure Pain improves with: medication Relief from Meds: 4  Mobility walk without assistance ability to climb steps?  yes do you drive?  yes  Function disabled: date disabled .  Neuro/Psych numbness tremor tingling  Prior Studies Any changes since last visit?  no  Physicians involved in your care Any changes since last visit?  no   Family History  Problem Relation Age of Onset  . Stroke Mother   . Dementia Mother   . Heart disease Father   . Stroke Father        heat stroke  . Kidney disease Brother   . Dementia Brother    Social History   Socioeconomic History  . Marital status: Married    Spouse name: Not on file  . Number of children: 6  . Years of education: 12+  . Highest education level: Not on file  Occupational History  . Occupation: Retired  Scientific laboratory technician  . Financial resource strain: Not on file  . Food insecurity    Worry: Not on file    Inability: Not on file  . Transportation needs    Medical: Not on file    Non-medical: Not on file  Tobacco Use  . Smoking status: Former Smoker    Packs/day: 1.00    Years: 30.00    Pack years: 30.00    Types: Cigarettes    Quit date: 05/25/2001    Years since quitting: 17.8  . Smokeless tobacco: Former Systems developer    Types: Chew, Snuff    Quit date: 09/22/2001  Substance and Sexual Activity  . Alcohol use: Not Currently  . Drug use: No  . Sexual activity: Not on file  Lifestyle  . Physical activity    Days per week: Not on file    Minutes per session: Not on file  . Stress: Not on file  Relationships  . Social Herbalist on phone: Not on file    Gets together: Not on file    Attends religious service: Not on file    Active member of club or organization: Not on file    Attends meetings of clubs or organizations: Not on file    Relationship status: Not on file  Other Topics Concern  . Not on file  Social History Narrative   Lives with wife   Caffeine use: Coffee daily (1-2 per day)   Some soda   Right handed    Past Surgical History:  Procedure Laterality Date  . ANTERIOR CERVICAL DECOMP/DISCECTOMY FUSION  09-19-2010    dr  jones  @[facility]   . COLONOSCOPY W/ POLYPECTOMY  last one 03/ 2019  . CYSTOSCOPY N/A 05/17/2018   Procedure: CYSTOSCOPY/ REMOVAL OF BLADDER CALCULUS;  Surgeon: [name], MD;  [facility];  Service: Urology;  Laterality: N/A;  ONLY NEEDS 45 MIN  . FINGER SURGERY  1970s   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 RIGHT MIDDLE FINGER  . IMPLANTATION BONE ANCHORED HEARING AID Right 2011    @[facility]    "no longer have this"  . LYMPHADENECTOMY Bilateral 08/04/2013   Procedure: LYMPHADENECTOMY;  Surgeon: Dutch Gray, MD;  [facility];  Service: Urology;  Laterality: Bilateral;  . PROSTATE SURGERY    . ROBOT ASSISTED LAPAROSCOPIC RADICAL PROSTATECTOMY N/A 08/04/2013   Procedure: ROBOTIC ASSISTED LAPAROSCOPIC RADICAL PROSTATECTOMY LEVEL 2;  Surgeon: Dutch Gray, MD;  [facility];  Service: Urology;  Laterality: N/A;   Past Medical History:  Diagnosis Date  .  Anticoagulated    xarelto  . Depression   . Gait abnormality    MILD --- DOES NOT USE CANE  . 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 History of kidney stones   . 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 Hyperlipidemia   . Hypertension   . 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 Nocturia    twice  . Prostate cancer ([place])    UROLOGIST-- DR [name]---  Stage T2a, Gleason 4+3, PSA 5.4,  s/p  radical prostatectomy 2015;   05-14-2018  per pt PSA undetectable  . Pulmonary embolism, bilateral ([place]) 12/02/2017   currently taking xarelto  . Wears dentures    upper  . Wears glasses   . Wears hearing aid in both ears    BP 134/84   Pulse 61   Temp (!) 97.3 F (36.3 C)   Ht 5\' 8"  (1.727 m)   Wt 190 lb (86.2 kg)   SpO2 98%   BMI 28.89 kg/m   Opioid Risk Score:   Fall Risk Score:  `1  Depression screen PHQ 2/9  Depression screen [facility] 2/9 10/22/2018 08/23/2018 04/05/2018 02/04/2018 11/03/2017 02/19/2017 12/12/2014  Decreased Interest 0 0 1 0 0 0 0  Down, Depressed, Hopeless 1 1 1  0 0 0 0  PHQ - 2 Score 1 1 2  0 0 0 0  Altered sleeping - - - - - - -  Tired, decreased energy - - - - - - -  Change in appetite - - - - - - -  Feeling bad or failure about yourself  - - - - - - -  Trouble concentrating - - - - - - -  Moving slowly or fidgety/restless - - - - - - -  Suicidal thoughts - - - - - - -  PHQ-9 Score - - - - - - -    Review of Systems  Constitutional: Negative.   HENT: Negative.   Eyes: Negative.   Respiratory: Negative.   Cardiovascular: Negative.   Gastrointestinal: Negative.   Endocrine: Negative.   Genitourinary: Negative.   Musculoskeletal: Positive for back pain and neck pain.  Skin: Negative.   Allergic/Immunologic: Negative.   Neurological: Positive for tremors and  numbness.       Tingling  Hematological: Negative.  Psychiatric/Behavioral: Negative.   All other systems reviewed and are negative.      Objective:   Physical Exam Vitals signs and nursing note reviewed.  Constitutional:      Appearance: Normal appearance.  Neck:     Musculoskeletal: Normal range of motion and neck supple.     Comments: Cervical Paraspinal Tenderness: C-5-C-6 Cardiovascular:     Rate and Rhythm: Normal rate and regular rhythm.     Pulses: Normal pulses.     Heart sounds: Normal heart sounds.  Pulmonary:     Effort: Pulmonary effort is normal.     Breath sounds: Normal breath sounds.  Musculoskeletal:     Comments: Normal Muscle Bulk and Muscle Testing Reveals:  Upper Extremities: Full ROM and Muscle Strength 5/5 Bilateral AC Joint Tenderness  Thoracic Hypersensitivity Lumbar Paraspinal Tenderness: L-3-L-5 Lower Extremities: Full ROM and Muscle Strength 5/5 Arises from chair with ease Narrow Based  Gait   Skin:    General: Skin is warm and dry.  Neurological:     Mental Status: He is alert and oriented to person, place, and time.  Psychiatric:        Mood and Affect: Mood normal.        Behavior: Behavior normal.           Assessment & Plan:  1.Cervical Postlaminectomy: Continue HEP and Continue current medication regime.11/16/ 2020. 2. Cervicalgia/ Cervical Radiculitis: Continue current medication regime. Continue to Monitor.04/04/2019 3. Chronic BilateralShoulder Pain:Continue HEP as Tolerated.04/04/2019 4. L2 compression fracture/ Chronic Bilateral Low Back Pain without Sciatica: Continue to Monitor:04/04/2019 Refilled:Oxycodone 5 mg one tablet every 6hours as needed for moderate pain. # 120. We will Continue the opioid monitoring program. This consists of regular clinic visits, examinations, urine drug screen, pill counts as well as use of [place] controlled substance reporting System. 5. Polyarthralgia: No complaints today.  Continue to Monitor.11/16//2020 6. Chronic Bilateral Thoracic Pain: Continue HEP as Tolerated. Continue  Current medication regimen. Continue to Monitor. 04/04/2019  50minutes of face to face patient care time was spent during this visit. All questions were encouraged and answered.  F/U in 1 month

## 2019-05-03 ENCOUNTER — Encounter: Payer: Medicare Other | Attending: Registered Nurse | Admitting: Registered Nurse

## 2019-05-03 ENCOUNTER — Other Ambulatory Visit: Payer: Self-pay

## 2019-05-03 ENCOUNTER — Encounter: Payer: Self-pay | Admitting: Registered Nurse

## 2019-05-03 VITALS — BP 144/92 | HR 66 | Temp 97.9°F | Ht 68.0 in | Wt 186.4 lb

## 2019-05-03 DIAGNOSIS — Z87891 Personal history of nicotine dependence: Secondary | ICD-10-CM | POA: Insufficient documentation

## 2019-05-03 DIAGNOSIS — M961 Postlaminectomy syndrome, not elsewhere classified: Secondary | ICD-10-CM | POA: Diagnosis not present

## 2019-05-03 DIAGNOSIS — G8929 Other chronic pain: Secondary | ICD-10-CM | POA: Diagnosis not present

## 2019-05-03 DIAGNOSIS — Z79891 Long term (current) use of opiate analgesic: Secondary | ICD-10-CM

## 2019-05-03 DIAGNOSIS — M25512 Pain in left shoulder: Secondary | ICD-10-CM | POA: Insufficient documentation

## 2019-05-03 DIAGNOSIS — Z87442 Personal history of urinary calculi: Secondary | ICD-10-CM | POA: Diagnosis not present

## 2019-05-03 DIAGNOSIS — M5412 Radiculopathy, cervical region: Secondary | ICD-10-CM | POA: Diagnosis not present

## 2019-05-03 DIAGNOSIS — Z981 Arthrodesis status: Secondary | ICD-10-CM | POA: Insufficient documentation

## 2019-05-03 DIAGNOSIS — M4802 Spinal stenosis, cervical region: Secondary | ICD-10-CM | POA: Diagnosis not present

## 2019-05-03 DIAGNOSIS — Z841 Family history of disorders of kidney and ureter: Secondary | ICD-10-CM | POA: Diagnosis not present

## 2019-05-03 DIAGNOSIS — E785 Hyperlipidemia, unspecified: Secondary | ICD-10-CM | POA: Insufficient documentation

## 2019-05-03 DIAGNOSIS — M542 Cervicalgia: Secondary | ICD-10-CM | POA: Insufficient documentation

## 2019-05-03 DIAGNOSIS — G894 Chronic pain syndrome: Secondary | ICD-10-CM

## 2019-05-03 DIAGNOSIS — Z823 Family history of stroke: Secondary | ICD-10-CM | POA: Insufficient documentation

## 2019-05-03 DIAGNOSIS — M25511 Pain in right shoulder: Secondary | ICD-10-CM | POA: Insufficient documentation

## 2019-05-03 DIAGNOSIS — M546 Pain in thoracic spine: Secondary | ICD-10-CM

## 2019-05-03 DIAGNOSIS — Z5181 Encounter for therapeutic drug level monitoring: Secondary | ICD-10-CM

## 2019-05-03 DIAGNOSIS — Z8249 Family history of ischemic heart disease and other diseases of the circulatory system: Secondary | ICD-10-CM | POA: Diagnosis not present

## 2019-05-03 DIAGNOSIS — M545 Low back pain: Secondary | ICD-10-CM

## 2019-05-03 DIAGNOSIS — I10 Essential (primary) hypertension: Secondary | ICD-10-CM | POA: Diagnosis not present

## 2019-05-03 DIAGNOSIS — H9190 Unspecified hearing loss, unspecified ear: Secondary | ICD-10-CM | POA: Insufficient documentation

## 2019-05-03 MED ORDER — OXYCODONE HCL 5 MG PO TABS: 5.0000 mg | ORAL_TABLET | Freq: Four times a day (QID) | ORAL | 0 refills | Status: DC | PRN

## 2019-05-03 NOTE — Progress Notes (Signed)
Subjective:    Patient ID: Fred [name], male    DOB: [birth year], 68 y.o.   MRN: [id number]  HPI: Fred [name] is a 68 y.o. male who returns for follow up appointment for chronic pain and medication refill. He states his pain is located in his neck radiating into his bilateral shoulders and mid- lower back pain. He rates his pain 4. His current exercise regime is walking and performing stretching exercises.  Fred [name] Morphine equivalent is 30.00  MME.  Last Oral Swab was Performed on 10/22/2018, it was consistent.    Pain Inventory Average Pain 5 Pain Right Now 4 My pain is sharp, stabbing, tingling and aching  In the last 24 hours, has pain interfered with the following? General activity 4 Relation with others 5 Enjoyment of life 6 What TIME of day is your pain at its worst? evening Sleep (in general) Fair  Pain is worse with: walking, bending, sitting, standing and some activites Pain improves with: rest, heat/ice and medication Relief from Meds: 5  Mobility walk without assistance ability to climb steps?  yes do you drive?  yes  Function disabled: date disabled .  Neuro/Psych No problems in this area  Prior Studies Any changes since last visit?  no  Physicians involved in your care Any changes since last visit?  no   Family History  Problem Relation Age of Onset  . Stroke Mother   . Dementia Mother   . Heart disease Father   . Stroke Father        heat stroke  . Kidney disease Brother   . Dementia Brother    Social History   Socioeconomic History  . Marital status: Married    Spouse name: Not on file  . Number of children: 6  . Years of education: 12+  . Highest education level: Not on file  Occupational History  . Occupation: Retired  Tobacco Use  . Smoking status: Former Smoker    Packs/day: 1.00    Years: 30.00    Pack years: 30.00    Types: Cigarettes    Quit date: 05/25/2001    Years since quitting: 17.9  . Smokeless tobacco:  Former Systems developer    Types: Chew, Snuff    Quit date: 09/22/2001  Substance and Sexual Activity  . Alcohol use: Not Currently  . Drug use: No  . Sexual activity: Not on file  Other Topics Concern  . Not on file  Social History Narrative   Lives with wife   Caffeine use: Coffee daily (1-2 per day)   Some soda   Right handed    Social Determinants of Health   Financial Resource Strain:   . Difficulty of Paying Living Expenses: Not on file  Food Insecurity:   . Worried About Charity fundraiser in the Last Year: Not on file  . Ran Out of Food in the Last Year: Not on file  Transportation Needs:   . Lack of Transportation (Medical): Not on file  . Lack of Transportation (Non-Medical): Not on file  Physical Activity:   . Days of Exercise per Week: Not on file  . Minutes of Exercise per Session: Not on file  Stress:   . Feeling of Stress : Not on file  Social Connections:   . Frequency of Communication with Friends and Family: Not on file  . Frequency of Social Gatherings with Friends and Family: Not on file  . Attends Religious Services: Not on file  .  Active Member of Clubs or Organizations: Not on file  . Attends Archivist Meetings: Not on file  . Marital Status: Not on file   Past Surgical History:  Procedure Laterality Date  . ANTERIOR CERVICAL DECOMP/DISCECTOMY FUSION  09-19-2010    dr Ronnald Ramp  @[facility]   . COLONOSCOPY W/ POLYPECTOMY  last one 03/ 2019  . CYSTOSCOPY N/A 05/17/2018   Procedure: CYSTOSCOPY/ REMOVAL OF BLADDER CALCULUS;  Surgeon: [name], MD;  Location: [facility];  Service: Urology;  Laterality: N/A;  ONLY NEEDS 45 MIN  . FINGER SURGERY  1970s   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 RIGHT MIDDLE FINGER  . IMPLANTATION BONE ANCHORED HEARING AID Right 2011    @[facility]    "no longer have this"  . LYMPHADENECTOMY Bilateral 08/04/2013   Procedure: LYMPHADENECTOMY;  Surgeon: Dutch Gray, MD;  Location: [facility];  Service: Urology;  Laterality:  Bilateral;  . PROSTATE SURGERY    . ROBOT ASSISTED LAPAROSCOPIC RADICAL PROSTATECTOMY N/A 08/04/2013   Procedure: ROBOTIC ASSISTED LAPAROSCOPIC RADICAL PROSTATECTOMY LEVEL 2;  Surgeon: Dutch Gray, MD;  Location: [facility];  Service: Urology;  Laterality: N/A;   Past Medical History:  Diagnosis Date  . Anticoagulated    xarelto  . Depression   . Gait abnormality    MILD --- DOES NOT USE CANE  . 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 History of kidney stones   . 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 Hyperlipidemia   . Hypertension   . 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 Nocturia    twice  . Prostate cancer ([place])    UROLOGIST-- DR [name]---  Stage T2a, Gleason 4+3, PSA 5.4,  s/p  radical prostatectomy 2015;   05-14-2018  per pt PSA undetectable  . Pulmonary embolism, bilateral ([place]) 12/02/2017   currently taking xarelto  . Wears dentures    upper  . Wears glasses   . Wears hearing aid in both ears    BP (!) 144/92   Pulse 66   Temp 97.9 F (36.6 C)   Ht 5\' 8"  (1.727 m)   Wt 186 lb 6.4 oz (84.6 kg)   SpO2 96%   BMI 28.34 kg/m   Opioid Risk Score:   Fall Risk Score:  `1  Depression screen PHQ 2/9  Depression screen [facility] 2/9 10/22/2018 08/23/2018 04/05/2018 02/04/2018 11/03/2017 02/19/2017 12/12/2014  Decreased Interest 0 0 1 0 0 0 0  Down, Depressed, Hopeless 1 1 1  0 0 0 0  PHQ - 2 Score 1 1 2  0 0 0 0  Altered sleeping - - - - - - -  Tired, decreased energy - - - - - - -  Change in appetite - - - - - - -  Feeling bad or failure about yourself  - - - - - - -  Trouble concentrating - - - - - - -  Moving slowly or fidgety/restless - - - - - - -  Suicidal thoughts - - - - - - -  PHQ-9 Score - - - - - - -    Review of Systems  Constitutional: Negative.   HENT: Negative.     Eyes: Negative.   Respiratory: Negative.   Cardiovascular:  Negative.   Gastrointestinal: Negative.   Endocrine: Negative.   Genitourinary: Negative.   Musculoskeletal: Positive for arthralgias, back pain and neck pain.  Skin: Negative.   Allergic/Immunologic: Negative.   Neurological: Negative.   Hematological: Negative.   Psychiatric/Behavioral: Negative.   All other systems reviewed and are negative.      Objective:   Physical Exam Vitals and nursing note reviewed.  Constitutional:      Appearance: Normal appearance.  Neck:     Comments: Cervical Paraspinal Tenderness: C-5-C-6 Cardiovascular:     Rate and Rhythm: Normal rate and regular rhythm.     Pulses: Normal pulses.     Heart sounds: Normal heart sounds.  Pulmonary:     Effort: Pulmonary effort is normal.     Breath sounds: Normal breath sounds.  Musculoskeletal:     Cervical back: Normal range of motion and neck supple.     Comments: Normal Muscle Bulk and Muscle Testing Reveals:  Upper Extremities: Full ROM and Muscle Strength 5/5 Bilateral AC Joint Tenderness  Thoracic Paraspinal Tenderness: T-1-T-3 Lumbar Hypersensitivity Lower Extremities: Full ROM and Muscle Strength 5/5 Arises from Chair with ease Narrow Based  Gait   Skin:    General: Skin is warm and dry.  Neurological:     Mental Status: He is alert and oriented to person, place, and time.  Psychiatric:        Mood and Affect: Mood normal.        Behavior: Behavior normal.           Assessment & Plan:  1.Cervical Postlaminectomy: Continue HEP and Continue current medication regime.12/15/ 2020. 2. Cervicalgia/ Cervical Radiculitis: Continue current medication regime. Continue to Monitor.05/03/2019 3. Chronic BilateralShoulder Pain:Continue HEP as Tolerated.05/03/2019 4. L2 compression fracture/ Chronic Bilateral Low Back Pain without Sciatica: Continue to Monitor:05/03/2019 Refilled:Oxycodone 5 mg one tablet every 6hours as  needed for moderate pain. #120. We will Continue the opioid monitoring program. This consists of regular clinic visits, examinations, urine drug screen, pill counts as well as use of [place] controlled substance reporting System. 5. Polyarthralgia:No complaints today.Continue to Monitor.12/15//2020 6. Chronic Bilateral Thoracic Pain: No complaints today.Continue HEP as Tolerated. Continue  Current medication regimen. Continue to Monitor. 05/03/2019  65minutes of face to face patient care time was spent during this visit. All questions were encouraged and answered.  F/U in 1 month

## 2019-05-04 ENCOUNTER — Encounter: Payer: Medicare Other | Admitting: Registered Nurse

## 2019-05-11 ENCOUNTER — Other Ambulatory Visit (HOSPITAL_COMMUNITY)
Admission: RE | Admit: 2019-05-11 | Discharge: 2019-05-11 | Disposition: A | Discharge status: Home / Self Care | Payer: Medicare Other | Source: Ambulatory Visit | Attending: Pulmonary Disease | Admitting: Pulmonary Disease

## 2019-05-11 DIAGNOSIS — Z20828 Contact with and (suspected) exposure to other viral communicable diseases: Secondary | ICD-10-CM | POA: Insufficient documentation

## 2019-05-11 DIAGNOSIS — Z01812 Encounter for preprocedural laboratory examination: Secondary | ICD-10-CM | POA: Diagnosis present

## 2019-05-11 LAB — SARS CORONAVIRUS 2 (TAT 6-24 HRS): SARS Coronavirus 2: NEGATIVE

## 2019-05-17 ENCOUNTER — Ambulatory Visit (INDEPENDENT_AMBULATORY_CARE_PROVIDER_SITE_OTHER): Payer: Medicare Other | Admitting: Pulmonary Disease

## 2019-05-17 ENCOUNTER — Other Ambulatory Visit: Payer: Self-pay

## 2019-05-17 DIAGNOSIS — R0602 Shortness of breath: Secondary | ICD-10-CM | POA: Diagnosis not present

## 2019-05-17 LAB — PULMONARY FUNCTION TEST
DL/VA % pred: 114 %
DL/VA: 4.7 ml/min/mmHg/L
DLCO unc % pred: 116 %
DLCO unc: 28.56 ml/min/mmHg
FEF 25-75 Post: 3.63 L/sec
FEF 25-75 Pre: 2.67 L/sec
FEF2575-%Change-Post: 35 %
FEF2575-%Pred-Post: 155 %
FEF2575-%Pred-Pre: 114 %
FEV1-%Change-Post: 9 %
FEV1-%Pred-Post: 106 %
FEV1-%Pred-Pre: 97 %
FEV1-Post: 3.23 L
FEV1-Pre: 2.96 L
FEV1FVC-%Change-Post: 5 %
FEV1FVC-%Pred-Pre: 105 %
FEV6-%Change-Post: 5 %
FEV6-%Pred-Post: 100 %
FEV6-%Pred-Pre: 96 %
FEV6-Post: 3.93 L
FEV6-Pre: 3.74 L
FEV6FVC-%Change-Post: 1 %
FEV6FVC-%Pred-Post: 106 %
FEV6FVC-%Pred-Pre: 104 %
FVC-%Change-Post: 3 %
FVC-%Pred-Post: 95 %
FVC-%Pred-Pre: 91 %
FVC-Post: 3.93 L
FVC-Pre: 3.78 L
Post FEV1/FVC ratio: 82 %
Post FEV6/FVC ratio: 100 %
Pre FEV1/FVC ratio: 78 %
Pre FEV6/FVC Ratio: 99 %
RV % pred: 85 %
RV: 1.99 L
TLC % pred: 88 %
TLC: 5.84 L

## 2019-05-17 NOTE — Progress Notes (Signed)
PFT done today. 

## 2019-05-26 ENCOUNTER — Other Ambulatory Visit: Payer: Self-pay

## 2019-05-26 ENCOUNTER — Ambulatory Visit: Payer: Medicare Other | Admitting: Pulmonary Disease

## 2019-05-26 ENCOUNTER — Encounter: Payer: Self-pay | Admitting: Pulmonary Disease

## 2019-05-26 DIAGNOSIS — R0609 Other forms of dyspnea: Secondary | ICD-10-CM

## 2019-05-26 DIAGNOSIS — I10 Essential (primary) hypertension: Secondary | ICD-10-CM | POA: Diagnosis not present

## 2019-05-26 DIAGNOSIS — R0600 Dyspnea, unspecified: Secondary | ICD-10-CM | POA: Diagnosis not present

## 2019-05-26 NOTE — Progress Notes (Signed)
   Subjective:    Patient ID: Fred [name], male    DOB: [birth year], 69 y.o.   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  He is also on Aricept for memory issues  We reviewed PFTs today.  He is feeling sleepy and tired, blood pressure is low today 92/70.  This has been running low.  He takes lisinopril 10 mg twice daily.  He also takes propranolol as needed for tremors-he has not taken this in several days but he is also bradycardic to the 50s Accompanied by wife Fred [name], denies chest pain.  Shortness of breath persists  -although not sure if he is attributing fatigue  Significant tests/ events reviewed Echo 11/2017 RVSP 46  CT angiogram from 11/2017 was reviewed which shows low clot burden in left upper lobe and bilateral lower lobe pulmonary arteries.  PFTs 04/2019 no obstruction, normal TLC and DLCO  Review of Systems neg for any significant sore throat, dysphagia, itching, sneezing, nasal congestion or excess/ purulent secretions, fever, chills, sweats, unintended wt loss, pleuritic or exertional cp, hempoptysis, orthopnea pnd or change in chronic leg swelling. Also denies presyncope, palpitations, heartburn, abdominal pain, nausea, vomiting, diarrhea or change in bowel or urinary habits, dysuria,hematuria, rash, arthralgias, visual complaints, headache, numbness weakness or ataxia.     Objective:   Physical Exam   Gen. Pleasant, well-nourished, in no distress ENT - no thrush, no pallor/icterus,no post nasal drip Neck: No JVD, no thyromegaly, no carotid bruits Lungs: no use of accessory muscles, no dullness to percussion, clear without rales or rhonchi  Cardiovascular: Rhythm regular, heart sounds  normal, no murmurs or  gallops, no peripheral edema Musculoskeletal: No deformities, no cyanosis or clubbing         Assessment & Plan:

## 2019-05-26 NOTE — Assessment & Plan Note (Signed)
No cause identified. PFTs normal-no evidence of obstruction to suggest COPD or no evidence of restriction to suggest coal miners lung  Can follow-up echo for residual pulmonary hypertension but doubt this is necessary

## 2019-05-26 NOTE — Assessment & Plan Note (Signed)
Blood pressure is low today. Decrease lisinopril to 10 mg once in the morning-check your blood pressure daily for 7 days and then contact Dr. [name]

## 2019-05-26 NOTE — Patient Instructions (Signed)
Lung function appears good.  Blood pressure is low today. Decrease lisinopril to 10 mg once in the morning-check your blood pressure daily for 7 days and then contact Dr. [name]

## 2019-06-03 ENCOUNTER — Encounter: Payer: Medicare Other | Attending: Registered Nurse | Admitting: Registered Nurse

## 2019-06-03 ENCOUNTER — Other Ambulatory Visit: Payer: Self-pay

## 2019-06-03 ENCOUNTER — Encounter: Payer: Self-pay | Admitting: Registered Nurse

## 2019-06-03 VITALS — BP 125/85 | HR 64 | Temp 97.7°F | Ht 68.0 in | Wt 184.0 lb

## 2019-06-03 DIAGNOSIS — Z87442 Personal history of urinary calculi: Secondary | ICD-10-CM | POA: Insufficient documentation

## 2019-06-03 DIAGNOSIS — I10 Essential (primary) hypertension: Secondary | ICD-10-CM | POA: Diagnosis not present

## 2019-06-03 DIAGNOSIS — G894 Chronic pain syndrome: Secondary | ICD-10-CM | POA: Diagnosis present

## 2019-06-03 DIAGNOSIS — M25512 Pain in left shoulder: Secondary | ICD-10-CM | POA: Insufficient documentation

## 2019-06-03 DIAGNOSIS — M4802 Spinal stenosis, cervical region: Secondary | ICD-10-CM

## 2019-06-03 DIAGNOSIS — Z87891 Personal history of nicotine dependence: Secondary | ICD-10-CM | POA: Diagnosis not present

## 2019-06-03 DIAGNOSIS — M545 Low back pain, unspecified: Secondary | ICD-10-CM

## 2019-06-03 DIAGNOSIS — M961 Postlaminectomy syndrome, not elsewhere classified: Secondary | ICD-10-CM | POA: Diagnosis not present

## 2019-06-03 DIAGNOSIS — G8929 Other chronic pain: Secondary | ICD-10-CM | POA: Diagnosis present

## 2019-06-03 DIAGNOSIS — H9190 Unspecified hearing loss, unspecified ear: Secondary | ICD-10-CM | POA: Diagnosis not present

## 2019-06-03 DIAGNOSIS — Z981 Arthrodesis status: Secondary | ICD-10-CM | POA: Diagnosis not present

## 2019-06-03 DIAGNOSIS — Z841 Family history of disorders of kidney and ureter: Secondary | ICD-10-CM | POA: Diagnosis not present

## 2019-06-03 DIAGNOSIS — Z8249 Family history of ischemic heart disease and other diseases of the circulatory system: Secondary | ICD-10-CM | POA: Insufficient documentation

## 2019-06-03 DIAGNOSIS — M546 Pain in thoracic spine: Secondary | ICD-10-CM

## 2019-06-03 DIAGNOSIS — M542 Cervicalgia: Secondary | ICD-10-CM | POA: Diagnosis not present

## 2019-06-03 DIAGNOSIS — M25511 Pain in right shoulder: Secondary | ICD-10-CM | POA: Insufficient documentation

## 2019-06-03 DIAGNOSIS — Z5181 Encounter for therapeutic drug level monitoring: Secondary | ICD-10-CM | POA: Diagnosis present

## 2019-06-03 DIAGNOSIS — Z823 Family history of stroke: Secondary | ICD-10-CM | POA: Insufficient documentation

## 2019-06-03 DIAGNOSIS — M5412 Radiculopathy, cervical region: Secondary | ICD-10-CM

## 2019-06-03 DIAGNOSIS — Z79891 Long term (current) use of opiate analgesic: Secondary | ICD-10-CM | POA: Diagnosis present

## 2019-06-03 DIAGNOSIS — E785 Hyperlipidemia, unspecified: Secondary | ICD-10-CM | POA: Insufficient documentation

## 2019-06-03 MED ORDER — OXYCODONE HCL 5 MG PO TABS: 5.0000 mg | ORAL_TABLET | Freq: Four times a day (QID) | ORAL | 0 refills | Status: DC | PRN

## 2019-06-03 NOTE — Progress Notes (Signed)
Subjective:    Patient ID: Fred [name], male    DOB: 1951-01-30, 69 y.o.   MRN: [id number]  HPI: Fred [name] is a 69 y.o. male who returns for follow up appointment for chronic pain and medication refill. He states his pain is located in his neck radiating into his bilateral shoulders and upper- lower back pain. He rates his pain 4. His  current exercise regime is walking and performing stretching exercises.   Fred [name] Morphine equivalent is 30.00 MME.    Oral Swab was Performed today . Fred [name] reports he was at his grand-daughter wedding yesterday 06/02/2019 and had a a liitle beer for  wedding toast, educated on the narcotic policy. He verbalizes understanding.    Pain Inventory Average Pain 5 Pain Right Now 4 My pain is sharp, stabbing, tingling and aching  In the last 24 hours, has pain interfered with the following? General activity 5 Relation with others 0 Enjoyment of life 0 What TIME of day is your pain at its worst? daytime, evening, night Sleep (in general) Fair  Pain is worse with: walking, bending, sitting, standing and some activites Pain improves with: medication Relief from Meds: ?  Mobility walk without assistance how many minutes can you walk? 10 ability to climb steps?  yes  Function disabled: date disabled . I need assistance with the following:  meal prep, household duties and shopping  Neuro/Psych numbness tremor confusion  Prior Studies Any changes since last visit?  no  Physicians involved in your care Any changes since last visit?  no   Family History  Problem Relation Age of Onset  . Stroke Mother   . Dementia Mother   . Heart disease Father   . Stroke Father        heat stroke  . Kidney disease Brother   . Dementia Brother    Social History   Socioeconomic History  . Marital status: Married    Spouse name: Not on file  . Number of children: 6  . Years of education: 12+  . Highest education level: Not on  file  Occupational History  . Occupation: Retired  Tobacco Use  . Smoking status: Former Smoker    Packs/day: 1.00    Years: 30.00    Pack years: 30.00    Types: Cigarettes    Quit date: 05/25/2001    Years since quitting: 18.0  . Smokeless tobacco: Former Systems developer    Types: Chew, Snuff    Quit date: 09/22/2001  Substance and Sexual Activity  . Alcohol use: Not Currently  . Drug use: No  . Sexual activity: Not on file  Other Topics Concern  . Not on file  Social History Narrative   Lives with wife   Caffeine use: Coffee daily (1-2 per day)   Some soda   Right handed    Social Determinants of Health   Financial Resource Strain:   . Difficulty of Paying Living Expenses: Not on file  Food Insecurity:   . Worried About Charity fundraiser in the Last Year: Not on file  . Ran Out of Food in the Last Year: Not on file  Transportation Needs:   . Lack of Transportation (Medical): Not on file  . Lack of Transportation (Non-Medical): Not on file  Physical Activity:   . Days of Exercise per Week: Not on file  . Minutes of Exercise per Session: Not on file  Stress:   . Feeling of Stress : Not  on file  Social Connections:   . Frequency of Communication with Friends and Family: Not on file  . Frequency of Social Gatherings with Friends and Family: Not on file  . Attends Religious Services: Not on file  . Active Member of Clubs or Organizations: Not on file  . Attends Archivist Meetings: Not on file  . Marital Status: Not on file   Past Surgical History:  Procedure Laterality Date  . ANTERIOR CERVICAL DECOMP/DISCECTOMY FUSION  09-19-2010    dr Ronnald Ramp  @[facility]   . COLONOSCOPY W/ POLYPECTOMY  last one 03/ 2019  . CYSTOSCOPY N/A 05/17/2018   Procedure: CYSTOSCOPY/ REMOVAL OF BLADDER CALCULUS;  Surgeon: [name], MD;  Location: [facility];  Service: Urology;  Laterality: N/A;  ONLY NEEDS 45 MIN  . FINGER SURGERY  1970s   REATTACH AMPUTATED RIGHT INDEX FINGER AND REVISION  AMPUTATED RIGHT MIDDLE FINGER  . IMPLANTATION BONE ANCHORED HEARING AID Right 2011    @[facility]    "no longer have this"  . LYMPHADENECTOMY Bilateral 08/04/2013   Procedure: LYMPHADENECTOMY;  Surgeon: Dutch Gray, MD;  Location: [facility];  Service: Urology;  Laterality: Bilateral;  . PROSTATE SURGERY    . ROBOT ASSISTED LAPAROSCOPIC RADICAL PROSTATECTOMY N/A 08/04/2013   Procedure: ROBOTIC ASSISTED LAPAROSCOPIC RADICAL PROSTATECTOMY LEVEL 2;  Surgeon: Dutch Gray, MD;  Location: [facility];  Service: Urology;  Laterality: N/A;   Past Medical History:  Diagnosis Date  . Anticoagulated    xarelto  . Depression   . Gait abnormality    MILD --- DOES NOT USE CANE  . 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 History of kidney stones   . 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 Hyperlipidemia   . Hypertension   . 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 Nocturia    twice  . Prostate cancer ([place])    UROLOGIST-- DR [name]---  Stage T2a, Gleason 4+3, PSA 5.4,  s/p  radical prostatectomy 2015;   05-14-2018  per pt PSA undetectable  . Pulmonary embolism, bilateral ([place]) 12/02/2017   currently taking xarelto  . Wears dentures    upper  . Wears glasses   . Wears hearing aid in both ears    BP (!) 143/91   Pulse (!) 58   Temp 97.7 F (36.5 C)   Ht 5\' 8"  (1.727 m)   Wt 184 lb (83.5 kg)   SpO2 96%   BMI 27.98 kg/m   Opioid Risk Score:   Fall Risk Score:  `1  Depression screen PHQ 2/9  Depression screen [facility] 2/9 10/22/2018 08/23/2018 04/05/2018 02/04/2018 11/03/2017 02/19/2017 12/12/2014  Decreased Interest 0 0 1 0 0 0 0  Down, Depressed, Hopeless 1 1 1  0 0 0 0  PHQ - 2 Score 1 1 2  0 0 0 0  Altered sleeping - - - - - - -  Tired, decreased energy - - - - - - -  Change in appetite - - - - - - -  Feeling bad  or failure about yourself  - - - - - - -  Trouble concentrating - - - - - - -  Moving slowly or fidgety/restless - - - - - - -  Suicidal thoughts - - - - - - -  PHQ-9 Score - - - - - - -    Review of Systems  Constitutional: Negative.   HENT: Negative.   Eyes: Negative.   Respiratory: Negative.   Cardiovascular: Negative.   Gastrointestinal: Negative.   Musculoskeletal: Positive for arthralgias, back pain and neck pain.  Skin: Negative.   Allergic/Immunologic: Negative.   Neurological: Positive for tremors and numbness.  Psychiatric/Behavioral: Positive for confusion.  All other systems reviewed and are negative.      Objective:   Physical Exam Vitals and nursing note reviewed.  Constitutional:      Appearance: Normal appearance.  Cardiovascular:     Rate and Rhythm: Normal rate and regular rhythm.     Pulses: Normal pulses.     Heart sounds: Normal heart sounds.  Pulmonary:     Effort: Pulmonary effort is normal.     Breath sounds: Normal breath sounds.  Musculoskeletal:     Cervical back: Normal range of motion and neck supple.     Comments: Normal Muscle Bulk and Muscle Testing Reveals:  Upper Extremities: Decreased ROM 90 Degrees  and Muscle Strength  5/5 Bilateral AC Joint Tenderness Thoracic Paraspinal Tenderness: T-1-T-3 T-7-T-9  Lumbar Paraspinal Tenderness: L-3-L-5 Lower Extremities: Full ROM and Muscle Strength 5/5 Arises from chair with ease Narrow Based Gait   Skin:    General: Skin is warm and dry.  Neurological:     Mental Status: He is alert and oriented to person, place, and time.  Psychiatric:        Mood and Affect: Mood normal.        Behavior: Behavior normal.           Assessment & Plan:  1.Cervical Postlaminectomy: Continue HEP and Continue current medication regime.06/02/2018. 2. Cervicalgia/ Cervical Radiculitis: Continue current medication regime. Continue to Monitor.06/02/2018 3. Chronic BilateralShoulder Pain:Continue HEP  as Tolerated.06/02/2018 4. L2 compression fracture/ Chronic Bilateral Low Back Pain without Sciatica: Continue to Monitor:06/02/2018 Refilled:Oxycodone 5 mg one tablet every 6hours as needed for moderate pain. #120. We will Continue the opioid monitoring program. This consists of regular clinic visits, examinations, urine drug screen, pill counts as well as use of [place] controlled substance reporting System. 5. Polyarthralgia:No complaints today.Continue to Monitor.01/15//2020 6. Chronic Bilateral Thoracic Pain: Continue HEP as Tolerated. Continue Current medication regimen. Continue to Monitor. 06/02/2018  11minutes of face to face patient care time was spent during this visit. All questions were encouraged and answered.  F/U in 1 month

## 2019-06-08 LAB — DRUG TOX MONITOR 1 W/CONF, ORAL FLD
Amphetamines: NEGATIVE ng/mL (ref <10)
Barbiturates: NEGATIVE ng/mL (ref <10)
Benzodiazepines: NEGATIVE ng/mL (ref <0.50)
Buprenorphine: NEGATIVE ng/mL (ref <0.10)
Cocaine: NEGATIVE ng/mL (ref <5.0)
Codeine: NEGATIVE ng/mL (ref <2.5)
Dihydrocodeine: NEGATIVE ng/mL (ref <2.5)
Fentanyl: NEGATIVE ng/mL (ref <0.10)
Heroin Metabolite: NEGATIVE ng/mL (ref <1.0)
Hydrocodone: NEGATIVE ng/mL (ref <2.5)
Hydromorphone: NEGATIVE ng/mL (ref <2.5)
MARIJUANA: NEGATIVE ng/mL (ref <2.5)
MDMA: NEGATIVE ng/mL (ref <10)
Meprobamate: NEGATIVE ng/mL (ref <2.5)
Methadone: NEGATIVE ng/mL (ref <5.0)
Morphine: NEGATIVE ng/mL (ref <2.5)
Nicotine Metabolite: NEGATIVE ng/mL (ref <5.0)
Norhydrocodone: NEGATIVE ng/mL (ref <2.5)
Noroxycodone: NEGATIVE ng/mL (ref <2.5)
Opiates: POSITIVE ng/mL — AB (ref <2.5)
Oxycodone: 8.6 ng/mL — ABNORMAL HIGH (ref <2.5)
Oxymorphone: NEGATIVE ng/mL (ref <2.5)
Phencyclidine: NEGATIVE ng/mL (ref <10)
Tapentadol: NEGATIVE ng/mL (ref <5.0)
Tramadol: NEGATIVE ng/mL (ref <5.0)
Zolpidem: NEGATIVE ng/mL (ref <5.0)

## 2019-06-08 LAB — DRUG TOX ALC METAB W/CON, ORAL FLD: Alcohol Metabolite: NEGATIVE ng/mL (ref <25)

## 2019-06-09 ENCOUNTER — Telehealth: Payer: Self-pay

## 2019-06-09 NOTE — Telephone Encounter (Signed)
DRUG SCREEN SWAB CONSISTENT

## 2019-07-04 ENCOUNTER — Other Ambulatory Visit: Payer: Self-pay

## 2019-07-04 ENCOUNTER — Encounter: Payer: Medicare Other | Attending: Registered Nurse | Admitting: Registered Nurse

## 2019-07-04 ENCOUNTER — Encounter: Payer: Self-pay | Admitting: Registered Nurse

## 2019-07-04 VITALS — BP 147/92 | HR 66 | Temp 97.7°F | Ht 68.0 in | Wt 185.0 lb

## 2019-07-04 DIAGNOSIS — Z87891 Personal history of nicotine dependence: Secondary | ICD-10-CM | POA: Diagnosis not present

## 2019-07-04 DIAGNOSIS — M25511 Pain in right shoulder: Secondary | ICD-10-CM | POA: Diagnosis not present

## 2019-07-04 DIAGNOSIS — E785 Hyperlipidemia, unspecified: Secondary | ICD-10-CM | POA: Diagnosis not present

## 2019-07-04 DIAGNOSIS — G894 Chronic pain syndrome: Secondary | ICD-10-CM | POA: Insufficient documentation

## 2019-07-04 DIAGNOSIS — Z8249 Family history of ischemic heart disease and other diseases of the circulatory system: Secondary | ICD-10-CM | POA: Diagnosis not present

## 2019-07-04 DIAGNOSIS — I10 Essential (primary) hypertension: Secondary | ICD-10-CM | POA: Insufficient documentation

## 2019-07-04 DIAGNOSIS — H9190 Unspecified hearing loss, unspecified ear: Secondary | ICD-10-CM | POA: Insufficient documentation

## 2019-07-04 DIAGNOSIS — M542 Cervicalgia: Secondary | ICD-10-CM | POA: Diagnosis not present

## 2019-07-04 DIAGNOSIS — Z5181 Encounter for therapeutic drug level monitoring: Secondary | ICD-10-CM

## 2019-07-04 DIAGNOSIS — Z87442 Personal history of urinary calculi: Secondary | ICD-10-CM | POA: Diagnosis not present

## 2019-07-04 DIAGNOSIS — Z841 Family history of disorders of kidney and ureter: Secondary | ICD-10-CM | POA: Diagnosis not present

## 2019-07-04 DIAGNOSIS — G8929 Other chronic pain: Secondary | ICD-10-CM

## 2019-07-04 DIAGNOSIS — Z981 Arthrodesis status: Secondary | ICD-10-CM | POA: Insufficient documentation

## 2019-07-04 DIAGNOSIS — M25512 Pain in left shoulder: Secondary | ICD-10-CM | POA: Insufficient documentation

## 2019-07-04 DIAGNOSIS — M961 Postlaminectomy syndrome, not elsewhere classified: Secondary | ICD-10-CM | POA: Diagnosis not present

## 2019-07-04 DIAGNOSIS — M5412 Radiculopathy, cervical region: Secondary | ICD-10-CM | POA: Diagnosis not present

## 2019-07-04 DIAGNOSIS — M546 Pain in thoracic spine: Secondary | ICD-10-CM

## 2019-07-04 DIAGNOSIS — Z823 Family history of stroke: Secondary | ICD-10-CM | POA: Diagnosis not present

## 2019-07-04 DIAGNOSIS — M545 Low back pain, unspecified: Secondary | ICD-10-CM

## 2019-07-04 DIAGNOSIS — M4802 Spinal stenosis, cervical region: Secondary | ICD-10-CM | POA: Diagnosis not present

## 2019-07-04 DIAGNOSIS — Z79891 Long term (current) use of opiate analgesic: Secondary | ICD-10-CM | POA: Insufficient documentation

## 2019-07-04 MED ORDER — OXYCODONE HCL 5 MG PO TABS: 5.0000 mg | ORAL_TABLET | Freq: Four times a day (QID) | ORAL | 0 refills | Status: DC | PRN

## 2019-07-04 NOTE — Progress Notes (Signed)
Subjective:    Patient ID: Fred [name], male    DOB: [birth year], 69 y.o.   MRN: [id number]  HPI: Fred [name] is a 69 y.o. male who returns for follow up appointment for chronic pain and medication refill. He states his pain is located in his neck radiating into his bilateral shoulders and mid- lower back pain. He rates his pain 5. His current exercise regime is walking.   [name] Morphine equivalent is 30.00  MME.    Last Oral Swab was Performed on 06/02/2018, it was consistent.    Pain Inventory Average Pain 5 Pain Right Now 5 My pain is sharp, stabbing, tingling and aching  In the last 24 hours, has pain interfered with the following? General activity 3 Relation with others 5 Enjoyment of life 5 What TIME of day is your pain at its worst? daytime, evening, night Sleep (in general) Fair  Pain is worse with: walking, bending, standing and some activites Pain improves with: rest and medication Relief from Meds: 4  Mobility walk without assistance  Function retired  Neuro/Psych No problems in this area  Prior Studies Any changes since last visit?  no  Physicians involved in your care Any changes since last visit?  no   Family History  Problem Relation Age of Onset  . Stroke Mother   . Dementia Mother   . Heart disease Father   . Stroke Father        heat stroke  . Kidney disease Brother   . Dementia Brother    Social History   Socioeconomic History  . Marital status: Married    Spouse name: Not on file  . Number of children: 6  . Years of education: 12+  . Highest education level: Not on file  Occupational History  . Occupation: Retired  Tobacco Use  . Smoking status: Former Smoker    Packs/day: 1.00    Years: 30.00    Pack years: 30.00    Types: Cigarettes    Quit date: 05/25/2001    Years since quitting: 18.1  . Smokeless tobacco: Former Systems developer    Types: Chew, Snuff    Quit date: 09/22/2001  Substance and Sexual Activity  . Alcohol  use: Not Currently  . Drug use: No  . Sexual activity: Not on file  Other Topics Concern  . Not on file  Social History Narrative   Lives with wife   Caffeine use: Coffee daily (1-2 per day)   Some soda   Right handed    Social Determinants of Health   Financial Resource Strain:   . Difficulty of Paying Living Expenses: Not on file  Food Insecurity:   . Worried About Charity fundraiser in the Last Year: Not on file  . Ran Out of Food in the Last Year: Not on file  Transportation Needs:   . Lack of Transportation (Medical): Not on file  . Lack of Transportation (Non-Medical): Not on file  Physical Activity:   . Days of Exercise per Week: Not on file  . Minutes of Exercise per Session: Not on file  Stress:   . Feeling of Stress : Not on file  Social Connections:   . Frequency of Communication with Friends and Family: Not on file  . Frequency of Social Gatherings with Friends and Family: Not on file  . Attends Religious Services: Not on file  . Active Member of Clubs or Organizations: Not on file  . Attends Club or  Organization Meetings: Not on file  . Marital Status: Not on file   Past Surgical History:  Procedure Laterality Date  . ANTERIOR CERVICAL DECOMP/DISCECTOMY FUSION  09-19-2010    dr Ronnald Ramp  @[facility]   . COLONOSCOPY W/ POLYPECTOMY  last one 03/ 2019  . CYSTOSCOPY N/A 05/17/2018   Procedure: CYSTOSCOPY/ REMOVAL OF BLADDER CALCULUS;  Surgeon: [name], MD;  Location: [facility];  Service: Urology;  Laterality: N/A;  ONLY NEEDS 45 MIN  . FINGER SURGERY  1970s   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 RIGHT MIDDLE FINGER  . IMPLANTATION BONE ANCHORED HEARING AID Right 2011    @[facility]    "no longer have this"  . LYMPHADENECTOMY Bilateral 08/04/2013   Procedure: LYMPHADENECTOMY;  Surgeon: Dutch Gray, MD;  Location: [facility];  Service: Urology;  Laterality: Bilateral;  . PROSTATE SURGERY    . ROBOT ASSISTED LAPAROSCOPIC RADICAL PROSTATECTOMY N/A 08/04/2013     Procedure: ROBOTIC ASSISTED LAPAROSCOPIC RADICAL PROSTATECTOMY LEVEL 2;  Surgeon: Dutch Gray, MD;  Location: [facility];  Service: Urology;  Laterality: N/A;   Past Medical History:  Diagnosis Date  . Anticoagulated    xarelto  . Depression   . Gait abnormality    MILD --- DOES NOT USE CANE  . 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 History of kidney stones   . 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 Hyperlipidemia   . Hypertension   . 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 Nocturia    twice  . Prostate cancer ([place])    UROLOGIST-- DR [name]---  Stage T2a, Gleason 4+3, PSA 5.4,  s/p  radical prostatectomy 2015;   05-14-2018  per pt PSA undetectable  . Pulmonary embolism, bilateral ([place]) 12/02/2017   currently taking xarelto  . Wears dentures    upper  . Wears glasses   . Wears hearing aid in both ears    There were no vitals taken for this visit.  Opioid Risk Score:   Fall Risk Score:  `1  Depression screen PHQ 2/9  Depression screen [facility] 2/9 10/22/2018 08/23/2018 04/05/2018 02/04/2018 11/03/2017 02/19/2017 12/12/2014  Decreased Interest 0 0 1 0 0 0 0  Down, Depressed, Hopeless 1 1 1  0 0 0 0  PHQ - 2 Score 1 1 2  0 0 0 0  Altered sleeping - - - - - - -  Tired, decreased energy - - - - - - -  Change in appetite - - - - - - -  Feeling bad or failure about yourself  - - - - - - -  Trouble concentrating - - - - - - -  Moving slowly or fidgety/restless - - - - - - -  Suicidal thoughts - - - - - - -  PHQ-9 Score - - - - - - -    Review of Systems  Constitutional: Negative.   HENT: Negative.   Eyes: Negative.   Respiratory: Negative.   Cardiovascular: Negative.   Gastrointestinal: Negative.   Endocrine: Negative.   Genitourinary: Negative.   Musculoskeletal: Positive for  arthralgias, back pain and neck pain.  Allergic/Immunologic: Negative.   Neurological: Negative.   Hematological: Negative.   Psychiatric/Behavioral: Negative.   All other systems reviewed and  are negative.      Objective:   Physical Exam Vitals and nursing note reviewed.  Constitutional:      Appearance: Normal appearance.  Cardiovascular:     Rate and Rhythm: Normal rate and regular rhythm.     Pulses: Normal pulses.     Heart sounds: Normal heart sounds.  Pulmonary:     Effort: Pulmonary effort is normal.     Breath sounds: Normal breath sounds.  Musculoskeletal:     Cervical back: Normal range of motion and neck supple.     Comments: Normal Muscle Bulk and Muscle Testing Reveals:  Upper Extremities: Full ROM and Muscle Strength 5/5 Thoracic Paraspinal Tenderness: T-3-T-7 Lumbar Paraspinal Tenderness: L-4-L-5 Lower Extremities: Full ROM and Muscle Strength 5/5 Arises from chair with ease Narrow Based  Gait   Neurological:     Mental Status: He is alert and oriented to person, place, and time.  Psychiatric:        Mood and Affect: Mood normal.        Behavior: Behavior normal.           Assessment & Plan:  1.Cervical Postlaminectomy: Continue HEP and Continue current medication regime.07/03/2018. 2. Cervicalgia/ Cervical Radiculitis: Continue current medication regime. Continue to Monitor.07/03/2018 3. Chronic BilateralShoulder Pain:Continue HEP as Tolerated.07/03/2018 4. L2 compression fracture/ Chronic Bilateral Low Back Pain without Sciatica: Continue to Monitor:07/03/2018 Refilled:Oxycodone 5 mg one tablet every 6hours as needed for moderate pain. #120. We will Continue the opioid monitoring program. This consists of regular clinic visits, examinations, urine drug screen, pill counts as well as use of [place] controlled substance reporting System. 5. Polyarthralgia:No complaints today.Continue to Monitor.02/15//2020 6. Chronic Bilateral  Thoracic Pain:Continue HEP as Tolerated. Continue Current medication regimen. Continue to Monitor. 07/03/2018  106minutes of face to face patient care time was spent during this visit. All questions were encouraged and answered.  F/U in 1 month

## 2019-08-02 ENCOUNTER — Other Ambulatory Visit: Payer: Self-pay

## 2019-08-02 ENCOUNTER — Encounter: Payer: Medicare Other | Attending: Registered Nurse | Admitting: Registered Nurse

## 2019-08-02 ENCOUNTER — Encounter: Payer: Self-pay | Admitting: Registered Nurse

## 2019-08-02 VITALS — BP 177/103 | HR 60 | Temp 97.5°F | Ht 68.0 in | Wt 187.0 lb

## 2019-08-02 DIAGNOSIS — M545 Low back pain, unspecified: Secondary | ICD-10-CM

## 2019-08-02 DIAGNOSIS — M542 Cervicalgia: Secondary | ICD-10-CM

## 2019-08-02 DIAGNOSIS — Z79891 Long term (current) use of opiate analgesic: Secondary | ICD-10-CM

## 2019-08-02 DIAGNOSIS — Z5181 Encounter for therapeutic drug level monitoring: Secondary | ICD-10-CM | POA: Diagnosis present

## 2019-08-02 DIAGNOSIS — Z823 Family history of stroke: Secondary | ICD-10-CM | POA: Diagnosis not present

## 2019-08-02 DIAGNOSIS — Z841 Family history of disorders of kidney and ureter: Secondary | ICD-10-CM | POA: Insufficient documentation

## 2019-08-02 DIAGNOSIS — G894 Chronic pain syndrome: Secondary | ICD-10-CM | POA: Diagnosis present

## 2019-08-02 DIAGNOSIS — M5412 Radiculopathy, cervical region: Secondary | ICD-10-CM

## 2019-08-02 DIAGNOSIS — I10 Essential (primary) hypertension: Secondary | ICD-10-CM | POA: Diagnosis not present

## 2019-08-02 DIAGNOSIS — H9190 Unspecified hearing loss, unspecified ear: Secondary | ICD-10-CM | POA: Diagnosis not present

## 2019-08-02 DIAGNOSIS — M961 Postlaminectomy syndrome, not elsewhere classified: Secondary | ICD-10-CM | POA: Diagnosis not present

## 2019-08-02 DIAGNOSIS — M25511 Pain in right shoulder: Secondary | ICD-10-CM | POA: Insufficient documentation

## 2019-08-02 DIAGNOSIS — M4802 Spinal stenosis, cervical region: Secondary | ICD-10-CM | POA: Diagnosis not present

## 2019-08-02 DIAGNOSIS — M25512 Pain in left shoulder: Secondary | ICD-10-CM | POA: Diagnosis not present

## 2019-08-02 DIAGNOSIS — Z87891 Personal history of nicotine dependence: Secondary | ICD-10-CM | POA: Diagnosis not present

## 2019-08-02 DIAGNOSIS — Z981 Arthrodesis status: Secondary | ICD-10-CM | POA: Insufficient documentation

## 2019-08-02 DIAGNOSIS — G8929 Other chronic pain: Secondary | ICD-10-CM

## 2019-08-02 DIAGNOSIS — M546 Pain in thoracic spine: Secondary | ICD-10-CM

## 2019-08-02 DIAGNOSIS — Z8249 Family history of ischemic heart disease and other diseases of the circulatory system: Secondary | ICD-10-CM | POA: Insufficient documentation

## 2019-08-02 DIAGNOSIS — Z87442 Personal history of urinary calculi: Secondary | ICD-10-CM | POA: Diagnosis not present

## 2019-08-02 DIAGNOSIS — E785 Hyperlipidemia, unspecified: Secondary | ICD-10-CM | POA: Diagnosis not present

## 2019-08-02 MED ORDER — OXYCODONE HCL 5 MG PO TABS: 5.0000 mg | ORAL_TABLET | Freq: Four times a day (QID) | ORAL | 0 refills | Status: DC | PRN

## 2019-08-02 NOTE — Progress Notes (Signed)
Subjective:    Patient ID: Fred [name], male    DOB: [birth year], 69 y.o.   MRN: [id number]  HPI: Fred [name] is a 69 y.o. male who returns for follow up appointment for chronic pain and medication refill. He states his pain is located in his neck radiating into his bilateral shoulders and mid- lower back. He rates his pain 4. His  current exercise regime is walking and performing stretching exercises.  Fred [name] arrived to office hypertensive, blood pressure was re-checked, he states he didn't take his anti-hypertensive medication this morning, he refuses ED or urgent care evaluation. Placed a call to Fred [name],and spoke with her regarding the above, they were educated again on medication compliance, she was also asked to assist Fred [name] with his medication, she stated " she would". Also was encouraged to keep a blood pressure journal, and to follow up with his PCP, they verbalize understanding.   Fred [name] equivalent is 30.00 MME.  Oral Swab was Performed on 06/03/2019, it was consistent.    Pain Inventory Average Pain 4 Pain Right Now 4 My pain is sharp, stabbing, tingling and aching  In the last 24 hours, has pain interfered with the following? General activity 6 Relation with others 6 Enjoyment of life 5 What TIME of day is your pain at its worst? daytime, evening, night Sleep (in general) Fair  Pain is worse with: walking, bending and standing Pain improves with: medication Relief from Meds: 5  Mobility walk without assistance ability to climb steps?  yes  Function disabled: date disabled .  Neuro/Psych No problems in this area  Prior Studies Any changes since last visit?  no  Physicians involved in your care Any changes since last visit?  no   Family History  Problem Relation Age of Onset  . Stroke Mother   . Dementia Mother   . Heart disease Father   . Stroke Father        heat stroke  . Kidney disease Brother   . Dementia  Brother    Social History   Socioeconomic History  . Marital status: Married    Spouse name: Not on file  . Number of children: 6  . Years of education: 12+  . Highest education level: Not on file  Occupational History  . Occupation: Retired  Tobacco Use  . Smoking status: Former Smoker    Packs/day: 1.00    Years: 30.00    Pack years: 30.00    Types: Cigarettes    Quit date: 05/25/2001    Years since quitting: 18.2  . Smokeless tobacco: Former Systems developer    Types: Chew, Snuff    Quit date: 09/22/2001  Substance and Sexual Activity  . Alcohol use: Not Currently  . Drug use: No  . Sexual activity: Not on file  Other Topics Concern  . Not on file  Social History Narrative   Lives with wife   Caffeine use: Coffee daily (1-2 per day)   Some soda   Right handed    Social Determinants of Health   Financial Resource Strain:   . Difficulty of Paying Living Expenses:   Food Insecurity:   . Worried About Charity fundraiser in the Last Year:   . Arboriculturist in the Last Year:   Transportation Needs:   . Film/video editor (Medical):   Marland Kitchen Lack of Transportation (Non-Medical):   Physical Activity:   . Days of Exercise per Week:   .  Minutes of Exercise per Session:   Stress:   . Feeling of Stress :   Social Connections:   . Frequency of Communication with Friends and Family:   . Frequency of Social Gatherings with Friends and Family:   . Attends Religious Services:   . Active Member of Clubs or Organizations:   . Attends Archivist Meetings:   Marland Kitchen Marital Status:    Past Surgical History:  Procedure Laterality Date  . ANTERIOR CERVICAL DECOMP/DISCECTOMY FUSION  09-19-2010    dr Ronnald Ramp  @[facility]   . COLONOSCOPY W/ POLYPECTOMY  last one 03/ 2019  . CYSTOSCOPY N/A 05/17/2018   Procedure: CYSTOSCOPY/ REMOVAL OF BLADDER CALCULUS;  Surgeon: [name], MD;  Location: [facility];  Service: Urology;  Laterality: N/A;  ONLY NEEDS 45 MIN  . FINGER SURGERY  1970s   REATTACH  AMPUTATED RIGHT INDEX FINGER AND REVISION AMPUTATED RIGHT MIDDLE FINGER  . IMPLANTATION BONE ANCHORED HEARING AID Right 2011    @[facility]    "no longer have this"  . LYMPHADENECTOMY Bilateral 08/04/2013   Procedure: LYMPHADENECTOMY;  Surgeon: Dutch Gray, MD;  Location: [facility];  Service: Urology;  Laterality: Bilateral;  . PROSTATE SURGERY    . ROBOT ASSISTED LAPAROSCOPIC RADICAL PROSTATECTOMY N/A 08/04/2013   Procedure: ROBOTIC ASSISTED LAPAROSCOPIC RADICAL PROSTATECTOMY LEVEL 2;  Surgeon: Dutch Gray, MD;  Location: [facility];  Service: Urology;  Laterality: N/A;   Past Medical History:  Diagnosis Date  . Anticoagulated    xarelto  . Depression   . Gait abnormality    MILD --- DOES NOT USE CANE  . 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 History of kidney stones   . 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 Hyperlipidemia   . Hypertension   . 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 Nocturia    twice  . Prostate cancer ([place])    UROLOGIST-- DR [name]---  Stage T2a, Gleason 4+3, PSA 5.4,  s/p  radical prostatectomy 2015;   05-14-2018  per pt PSA undetectable  . Pulmonary embolism, bilateral ([place]) 12/02/2017   currently taking xarelto  . Wears dentures    upper  . Wears glasses   . Wears hearing aid in both ears    BP (!) 184/121   Pulse (!) 55   Temp (!) 97.5 F (36.4 C)   Ht 5\' 8"  (1.727 m)   Wt 187 lb (84.8 kg)   SpO2 98%   BMI 28.43 kg/m   Opioid Risk Score:   Fall Risk Score:  `1  Depression screen PHQ 2/9  Depression screen [facility] 2/9 10/22/2018 08/23/2018 04/05/2018 02/04/2018 11/03/2017 02/19/2017 12/12/2014  Decreased Interest 0 0 1 0 0 0 0  Down, Depressed, Hopeless 1 1 1  0 0 0 0  PHQ - 2 Score 1 1 2  0 0 0 0  Altered sleeping - - - - - - -  Tired, decreased energy - - - - - - -    Change in appetite - - - - - - -  Feeling bad or failure about yourself  - - - - - - -  Trouble concentrating - - - - - - -  Moving slowly or fidgety/restless - - - - - - -  Suicidal thoughts - - - - - - -  PHQ-9 Score - - - - - - -    Review of Systems  Constitutional: Negative.   HENT: Negative.   Eyes: Negative.   Respiratory: Negative.   Cardiovascular: Negative.   Gastrointestinal: Negative.   Endocrine: Negative.   Genitourinary: Negative.   Musculoskeletal: Positive for arthralgias, back pain and neck pain.  Skin: Negative.   Allergic/Immunologic: Negative.   Neurological: Negative.   Hematological: Negative.   Psychiatric/Behavioral: Negative.   All other systems reviewed and are negative.      Objective:   Physical Exam Vitals and nursing note reviewed.  Constitutional:      Appearance: Normal appearance.  Cardiovascular:     Rate and Rhythm: Normal rate and regular rhythm.     Pulses: Normal pulses.     Heart sounds: Normal heart sounds.  Pulmonary:     Effort: Pulmonary effort is normal.     Breath sounds: Normal breath sounds.  Musculoskeletal:     Cervical back: Normal range of motion and neck supple.     Comments: Normal Muscle Bulk and Muscle Testing Reveals:  Upper Extremities: Full ROM and Muscle Strength 5/5 Thoracic Paraspinal Tenderness: T-7-T-9 Lumbar Paraspinal Tenderness: L-4-L-5 Lower Extremities: Full ROM and Muscle Strength 5/5 Arises from chair with ease Narrow Based  Gait   Skin:    General: Skin is warm and dry.  Neurological:     Mental Status: He is alert and oriented to person, place, and time.  Psychiatric:        Mood and Affect: Mood normal.        Behavior: Behavior normal.           Assessment & Plan:  1.Cervical Postlaminectomy: Continue HEP and Continue current medication regime.08/02/2018. 2. Cervicalgia/ Cervical Radiculitis: Continue current medication regime. Continue to Monitor.08/02/2018 3. Chronic  BilateralShoulder Pain:Continue HEP as Tolerated.08/02/2018 4. L2 compression fracture/ Chronic Bilateral Low Back Pain without Sciatica: Continue to Monitor:08/02/2018 Refilled:Oxycodone 5 mg one tablet every 6hours as needed for moderate pain. #120. We will Continue the opioid monitoring program. This consists of regular clinic visits, examinations, urine drug screen, pill counts as well as use of [place] controlled substance reporting System. 5. Polyarthralgia:No complaints today.Continue to Monitor.03/16//2020 6. Chronic Bilateral Thoracic Pain:Continue HEP as Tolerated. Continue Current medication regimen. Continue to Monitor.08/02/2018 7. Uncontrolled Hypertension: He refuses ED or Urgent Care Evaluation. Placed a call to Mrs. Silvestri regarding the above and medication compliance, she verbalizes understanding. She was asked to assist Mr. Hohman with his medication, she verbalizes understanding. Also encouraged to keep blood pressure log and to follow up with his PCP.   51minutes of face to face patient care time was spent during this visit. All questions were encouraged and answered.  F/U in 1 month

## 2019-09-02 ENCOUNTER — Encounter: Payer: Self-pay | Admitting: Registered Nurse

## 2019-09-02 ENCOUNTER — Encounter: Payer: Medicare Other | Attending: Registered Nurse | Admitting: Registered Nurse

## 2019-09-02 ENCOUNTER — Other Ambulatory Visit: Payer: Self-pay

## 2019-09-02 VITALS — BP 157/96 | HR 58 | Temp 98.5°F | Ht 68.0 in | Wt 182.6 lb

## 2019-09-02 DIAGNOSIS — I10 Essential (primary) hypertension: Secondary | ICD-10-CM | POA: Insufficient documentation

## 2019-09-02 DIAGNOSIS — M545 Low back pain, unspecified: Secondary | ICD-10-CM

## 2019-09-02 DIAGNOSIS — M961 Postlaminectomy syndrome, not elsewhere classified: Secondary | ICD-10-CM | POA: Diagnosis not present

## 2019-09-02 DIAGNOSIS — M5412 Radiculopathy, cervical region: Secondary | ICD-10-CM

## 2019-09-02 DIAGNOSIS — Z981 Arthrodesis status: Secondary | ICD-10-CM | POA: Diagnosis not present

## 2019-09-02 DIAGNOSIS — Z5181 Encounter for therapeutic drug level monitoring: Secondary | ICD-10-CM

## 2019-09-02 DIAGNOSIS — M546 Pain in thoracic spine: Secondary | ICD-10-CM

## 2019-09-02 DIAGNOSIS — Z8249 Family history of ischemic heart disease and other diseases of the circulatory system: Secondary | ICD-10-CM | POA: Insufficient documentation

## 2019-09-02 DIAGNOSIS — M25512 Pain in left shoulder: Secondary | ICD-10-CM | POA: Insufficient documentation

## 2019-09-02 DIAGNOSIS — H9190 Unspecified hearing loss, unspecified ear: Secondary | ICD-10-CM | POA: Insufficient documentation

## 2019-09-02 DIAGNOSIS — Z823 Family history of stroke: Secondary | ICD-10-CM | POA: Diagnosis not present

## 2019-09-02 DIAGNOSIS — M542 Cervicalgia: Secondary | ICD-10-CM | POA: Diagnosis not present

## 2019-09-02 DIAGNOSIS — G894 Chronic pain syndrome: Secondary | ICD-10-CM

## 2019-09-02 DIAGNOSIS — G8929 Other chronic pain: Secondary | ICD-10-CM | POA: Diagnosis present

## 2019-09-02 DIAGNOSIS — E785 Hyperlipidemia, unspecified: Secondary | ICD-10-CM | POA: Diagnosis not present

## 2019-09-02 DIAGNOSIS — Z87891 Personal history of nicotine dependence: Secondary | ICD-10-CM | POA: Insufficient documentation

## 2019-09-02 DIAGNOSIS — Z841 Family history of disorders of kidney and ureter: Secondary | ICD-10-CM | POA: Diagnosis not present

## 2019-09-02 DIAGNOSIS — M25511 Pain in right shoulder: Secondary | ICD-10-CM | POA: Insufficient documentation

## 2019-09-02 DIAGNOSIS — Z79891 Long term (current) use of opiate analgesic: Secondary | ICD-10-CM | POA: Diagnosis present

## 2019-09-02 DIAGNOSIS — Z87442 Personal history of urinary calculi: Secondary | ICD-10-CM | POA: Insufficient documentation

## 2019-09-02 DIAGNOSIS — M4802 Spinal stenosis, cervical region: Secondary | ICD-10-CM | POA: Diagnosis not present

## 2019-09-02 MED ORDER — OXYCODONE HCL 5 MG PO TABS: 5.0000 mg | ORAL_TABLET | Freq: Four times a day (QID) | ORAL | 0 refills | Status: DC | PRN

## 2019-09-02 NOTE — Progress Notes (Signed)
Subjective:    Patient ID: Fred [name], male    DOB: Feb 21, 1951, 69 y.o.   MRN: [id number]  HPI: Fred [name] is a 69 y.o. male who returns for follow up appointment for chronic pain and medication refill. He states his pain is located in his neck radiating into his bilateral shoulders and upper- lower back pain. He rates his his pain 5. His current exercise regime is walking and performing stretching exercises.  Spoke with [name], she states they will be going to [place] for a wedding on May 10th and return on May 21st, she was instructed to call office on May 3rd, to see if pharmacy will approve a week supply of Oxycodone, she verbalizes understanding.   Apical Pulse was checked: 60.  Fred [name] is 30.00 MME.08/02/2018. Last Oral Swab was Performed on 06/03/2019, it was consistent.   Pain Inventory Average Pain 5 Pain Right Now 5 My pain is sharp, stabbing, tingling and aching  In the last 24 hours, has pain interfered with the following? General activity 6 Relation with others 5 Enjoyment of life 5 What TIME of day is your pain at its worst? daytime and night Sleep (in general) Good  Pain is worse with: walking, bending, standing and some activites Pain improves with: medication and TENS Relief from Meds: 5  Mobility walk without assistance ability to climb steps?  yes do you drive?  yes  Function disabled: date disabled .  Neuro/Psych No problems in this area  Prior Studies Any changes since last visit?  no  Physicians involved in your care Any changes since last visit?  no   Family History  Problem Relation Age of Onset  . Stroke Mother   . Dementia Mother   . Heart disease Father   . Stroke Father        heat stroke  . Kidney disease Brother   . Dementia Brother    Social History   Socioeconomic History  . Marital status: Married    Spouse name: Not on file  . Number of children: 6  . Years of education: 12+    . Highest education level: Not on file  Occupational History  . Occupation: Retired  Tobacco Use  . Smoking status: Former Smoker    Packs/day: 1.00    Years: 30.00    Pack years: 30.00    Types: Cigarettes    Quit date: 05/25/2001    Years since quitting: 18.2  . Smokeless tobacco: Former Systems developer    Types: Chew, Snuff    Quit date: 09/22/2001  Substance and Sexual Activity  . Alcohol use: Not Currently  . Drug use: No  . Sexual activity: Not on file  Other Topics Concern  . Not on file  Social History Narrative   Lives with wife   Caffeine use: Coffee daily (1-2 per day)   Some soda   Right handed    Social Determinants of Health   Financial Resource Strain:   . Difficulty of Paying Living Expenses:   Food Insecurity:   . Worried About Charity fundraiser in the Last Year:   . Arboriculturist in the Last Year:   Transportation Needs:   . Film/video editor (Medical):   Marland Kitchen Lack of Transportation (Non-Medical):   Physical Activity:   . Days of Exercise per Week:   . Minutes of Exercise per Session:   Stress:   . Feeling of Stress :  Social Connections:   . Frequency of Communication with Friends and Family:   . Frequency of Social Gatherings with Friends and Family:   . Attends Religious Services:   . Active Member of Clubs or Organizations:   . Attends Archivist Meetings:   Marland Kitchen Marital Status:    Past Surgical History:  Procedure Laterality Date  . ANTERIOR CERVICAL DECOMP/DISCECTOMY FUSION  09-19-2010    dr Ronnald Ramp  @[facility]   . COLONOSCOPY W/ POLYPECTOMY  last one 03/ 2019  . CYSTOSCOPY N/A 05/17/2018   Procedure: CYSTOSCOPY/ REMOVAL OF BLADDER CALCULUS;  Surgeon: [name], MD;  Location: [facility];  Service: Urology;  Laterality: N/A;  ONLY NEEDS 45 MIN  . FINGER SURGERY  1970s   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 RIGHT MIDDLE FINGER  . IMPLANTATION BONE ANCHORED HEARING AID Right 2011    @[facility]    "no longer have this"  .  LYMPHADENECTOMY Bilateral 08/04/2013   Procedure: LYMPHADENECTOMY;  Surgeon: Dutch Gray, MD;  Location: [facility];  Service: Urology;  Laterality: Bilateral;  . PROSTATE SURGERY    . ROBOT ASSISTED LAPAROSCOPIC RADICAL PROSTATECTOMY N/A 08/04/2013   Procedure: ROBOTIC ASSISTED LAPAROSCOPIC RADICAL PROSTATECTOMY LEVEL 2;  Surgeon: Dutch Gray, MD;  Location: [facility];  Service: Urology;  Laterality: N/A;   Past Medical History:  Diagnosis Date  . Anticoagulated    xarelto  . Depression   . Gait abnormality    MILD --- DOES NOT USE CANE  . 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 History of kidney stones   . 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 Hyperlipidemia   . Hypertension   . 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 Nocturia    twice  . Prostate cancer ([place])    UROLOGIST-- DR [name]---  Stage T2a, Gleason 4+3, PSA 5.4,  s/p  radical prostatectomy 2015;   05-14-2018  per pt PSA undetectable  . Pulmonary embolism, bilateral ([place]) 12/02/2017   currently taking xarelto  . Wears dentures    upper  . Wears glasses   . Wears hearing aid in both ears    BP (!) 157/96   Pulse (!) 58   Temp 98.5 F (36.9 C)   Ht 5\' 8"  (1.727 m)   Wt 182 lb 9.6 oz (82.8 kg)   SpO2 98%   BMI 27.76 kg/m   Opioid Risk Score:   Fall Risk Score:  `1  Depression screen PHQ 2/9  Depression screen [facility] 2/9 10/22/2018 08/23/2018 04/05/2018 02/04/2018 11/03/2017 02/19/2017 12/12/2014  Decreased Interest 0 0 1 0 0 0 0  Down, Depressed, Hopeless 1 1 1  0 0 0 0  PHQ - 2 Score 1 1 2  0 0 0 0  Altered sleeping - - - - - - -  Tired, decreased energy - - - - - - -  Change in appetite - - - - - - -  Feeling bad or failure about yourself  - - - - - - -  Trouble concentrating - - - - - - -  Moving slowly or fidgety/restless -  - - - - - -  Suicidal thoughts - - - - - - -  PHQ-9 Score - - - - - - -  Review of Systems  All other systems reviewed and are negative.      Objective:   Physical Exam Vitals and nursing note reviewed.  Constitutional:      Appearance: Normal appearance.  Cardiovascular:     Rate and Rhythm: Normal rate and regular rhythm.     Pulses: Normal pulses.     Heart sounds: Normal heart sounds.  Pulmonary:     Effort: Pulmonary effort is normal.     Breath sounds: Normal breath sounds.  Musculoskeletal:     Cervical back: Normal range of motion and neck supple.     Comments: Normal Muscle Bulk and Muscle Testing Reveals:  Upper Extremities: Full ROM and Muscle Strength 5/5 Bilateral AC Joint Tenderness Lumbar Paraspinal Tenderness: L-3- L-5 Lower Extremities: Full ROM and Muscle Strength 5/5 Arises from chair with ease Narrow Based Gait   Skin:    General: Skin is warm and dry.  Neurological:     Mental Status: He is alert and oriented to person, place, and time.  Psychiatric:        Mood and Affect: Mood normal.        Behavior: Behavior normal.           Assessment & Plan:  1.Cervical Postlaminectomy: Continue HEP and Continue current medication regime.09/02/2018. 2. Cervicalgia/ Cervical Radiculitis: Continue current medication regime. Continue to Monitor.09/02/2018 3. Chronic BilateralShoulder Pain:Continue HEP as Tolerated.09/02/2018 4. L2 compression fracture/ Chronic Bilateral Low Back Pain without Sciatica: Continue to Monitor:09/02/2018 Refilled:Oxycodone 5 mg one tablet every 6hours as needed for moderate pain. #120. We will Continue the opioid monitoring program. This consists of regular clinic visits, examinations, urine drug screen, pill counts as well as use of [place] controlled substance reporting System. 5. Polyarthralgia:No complaints today.Continue to Monitor.04/16//2020 6. Chronic Bilateral Thoracic Pain:Continue HEP as  Tolerated. Continue Current medication regimen. Continue to Monitor.09/02/2018 .   82minutes of face to face patient care time was spent during this visit. All questions were encouraged and answered.  F/U in 1 month

## 2019-09-19 ENCOUNTER — Telehealth: Payer: Self-pay

## 2019-09-19 NOTE — Telephone Encounter (Signed)
Patient wife called stating she was calling to leave message for Zella Ball if she can get a return call and will give the message.

## 2019-09-20 NOTE — Telephone Encounter (Signed)
Patient wife called requesting a bridge of pain meds because they will be out of town 5/10-5/21/2021. [facility].

## 2019-09-20 NOTE — Telephone Encounter (Signed)
Placed a call to Fred [name], and spoke with Pharmacist Der. Mr. Carleton picked up the Oxycodone on 09/06/2019. Placed a call to Mrs. Guidotti regarding the above, no bridge prescription will be ordered due to the above, she verbalizes understanding.

## 2019-10-10 ENCOUNTER — Other Ambulatory Visit: Payer: Self-pay

## 2019-10-10 ENCOUNTER — Encounter: Payer: Medicare Other | Attending: Registered Nurse | Admitting: Registered Nurse

## 2019-10-10 ENCOUNTER — Encounter: Payer: Self-pay | Admitting: Registered Nurse

## 2019-10-10 VITALS — BP 133/90 | HR 75 | Temp 97.3°F | Ht 68.0 in | Wt 184.0 lb

## 2019-10-10 DIAGNOSIS — Z981 Arthrodesis status: Secondary | ICD-10-CM | POA: Diagnosis not present

## 2019-10-10 DIAGNOSIS — M25511 Pain in right shoulder: Secondary | ICD-10-CM | POA: Insufficient documentation

## 2019-10-10 DIAGNOSIS — Z87891 Personal history of nicotine dependence: Secondary | ICD-10-CM | POA: Diagnosis not present

## 2019-10-10 DIAGNOSIS — E785 Hyperlipidemia, unspecified: Secondary | ICD-10-CM | POA: Insufficient documentation

## 2019-10-10 DIAGNOSIS — Z823 Family history of stroke: Secondary | ICD-10-CM | POA: Diagnosis not present

## 2019-10-10 DIAGNOSIS — I10 Essential (primary) hypertension: Secondary | ICD-10-CM | POA: Insufficient documentation

## 2019-10-10 DIAGNOSIS — Z87442 Personal history of urinary calculi: Secondary | ICD-10-CM | POA: Insufficient documentation

## 2019-10-10 DIAGNOSIS — M961 Postlaminectomy syndrome, not elsewhere classified: Secondary | ICD-10-CM | POA: Diagnosis not present

## 2019-10-10 DIAGNOSIS — M25512 Pain in left shoulder: Secondary | ICD-10-CM | POA: Insufficient documentation

## 2019-10-10 DIAGNOSIS — M542 Cervicalgia: Secondary | ICD-10-CM | POA: Insufficient documentation

## 2019-10-10 DIAGNOSIS — M4802 Spinal stenosis, cervical region: Secondary | ICD-10-CM

## 2019-10-10 DIAGNOSIS — Z79891 Long term (current) use of opiate analgesic: Secondary | ICD-10-CM | POA: Insufficient documentation

## 2019-10-10 DIAGNOSIS — Z8249 Family history of ischemic heart disease and other diseases of the circulatory system: Secondary | ICD-10-CM | POA: Diagnosis not present

## 2019-10-10 DIAGNOSIS — Z5181 Encounter for therapeutic drug level monitoring: Secondary | ICD-10-CM | POA: Diagnosis present

## 2019-10-10 DIAGNOSIS — G8929 Other chronic pain: Secondary | ICD-10-CM | POA: Insufficient documentation

## 2019-10-10 DIAGNOSIS — M545 Low back pain: Secondary | ICD-10-CM

## 2019-10-10 DIAGNOSIS — H9190 Unspecified hearing loss, unspecified ear: Secondary | ICD-10-CM | POA: Insufficient documentation

## 2019-10-10 DIAGNOSIS — M546 Pain in thoracic spine: Secondary | ICD-10-CM

## 2019-10-10 DIAGNOSIS — M5412 Radiculopathy, cervical region: Secondary | ICD-10-CM | POA: Diagnosis not present

## 2019-10-10 DIAGNOSIS — G894 Chronic pain syndrome: Secondary | ICD-10-CM | POA: Insufficient documentation

## 2019-10-10 DIAGNOSIS — Z841 Family history of disorders of kidney and ureter: Secondary | ICD-10-CM | POA: Insufficient documentation

## 2019-10-10 MED ORDER — OXYCODONE HCL 5 MG PO TABS: 5.0000 mg | ORAL_TABLET | Freq: Four times a day (QID) | ORAL | 0 refills | Status: DC | PRN

## 2019-10-10 NOTE — Progress Notes (Signed)
Subjective:    Patient ID: Fred [name], male    DOB: 1951/03/03, 69 y.o.   MRN: [id number]  HPI: Fred [name] is a 69 y.o. male who returns for follow up appointment for chronic pain and medication refill. He states his pain is located in his neck radiating into his bilateral shoulders and mid- lower back pain. He rates his pain 5. His current exercise regime is walking and performing stretching exercises.  Fred [name] Morphine equivalent is 30.00  MME.  Last Oral Swab was Performed on 06/03/2019, it was consistent.    Pain Inventory Average Pain 6 Pain Right Now 5 My pain is sharp, stabbing, tingling and aching  In the last 24 hours, has pain interfered with the following? General activity 6 Relation with others 6 Enjoyment of life 5 What TIME of day is your pain at its worst? daytime, evening Sleep (in general) Fair  Pain is worse with: walking, bending, sitting, standing and some activites Pain improves with: medication and TENS Relief from Meds: 3  Mobility walk without assistance ability to climb steps?  yes  Function disabled: date disabled .  Neuro/Psych numbness tremor tingling spasms  Prior Studies Any changes since last visit?  no  Physicians involved in your care Any changes since last visit?  no   Family History  Problem Relation Age of Onset  . Stroke Mother   . Dementia Mother   . Heart disease Father   . Stroke Father        heat stroke  . Kidney disease Brother   . Dementia Brother    Social History   Socioeconomic History  . Marital status: Married    Spouse name: Not on file  . Number of children: 6  . Years of education: 12+  . Highest education level: Not on file  Occupational History  . Occupation: Retired  Tobacco Use  . Smoking status: Former Smoker    Packs/day: 1.00    Years: 30.00    Pack years: 30.00    Types: Cigarettes    Quit date: 05/25/2001    Years since quitting: 18.3  . Smokeless tobacco: Former Systems developer     Types: Chew, Snuff    Quit date: 09/22/2001  Substance and Sexual Activity  . Alcohol use: Not Currently  . Drug use: No  . Sexual activity: Not on file  Other Topics Concern  . Not on file  Social History Narrative   Lives with wife   Caffeine use: Coffee daily (1-2 per day)   Some soda   Right handed    Social Determinants of Health   Financial Resource Strain:   . Difficulty of Paying Living Expenses:   Food Insecurity:   . Worried About Charity fundraiser in the Last Year:   . Arboriculturist in the Last Year:   Transportation Needs:   . Film/video editor (Medical):   Marland Kitchen Lack of Transportation (Non-Medical):   Physical Activity:   . Days of Exercise per Week:   . Minutes of Exercise per Session:   Stress:   . Feeling of Stress :   Social Connections:   . Frequency of Communication with Friends and Family:   . Frequency of Social Gatherings with Friends and Family:   . Attends Religious Services:   . Active Member of Clubs or Organizations:   . Attends Archivist Meetings:   Marland Kitchen Marital Status:    Past Surgical History:  Procedure  Laterality Date  . ANTERIOR CERVICAL DECOMP/DISCECTOMY FUSION  09-19-2010    dr Ronnald Ramp  @[facility]   . COLONOSCOPY W/ POLYPECTOMY  last one 03/ 2019  . CYSTOSCOPY N/A 05/17/2018   Procedure: CYSTOSCOPY/ REMOVAL OF BLADDER CALCULUS;  Surgeon: [name], MD;  Location: [facility];  Service: Urology;  Laterality: N/A;  ONLY NEEDS 45 MIN  . FINGER SURGERY  1970s   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 RIGHT MIDDLE FINGER  . IMPLANTATION BONE ANCHORED HEARING AID Right 2011    @[facility]    "no longer have this"  . LYMPHADENECTOMY Bilateral 08/04/2013   Procedure: LYMPHADENECTOMY;  Surgeon: Dutch Gray, MD;  Location: [facility];  Service: Urology;  Laterality: Bilateral;  . PROSTATE SURGERY    . ROBOT ASSISTED LAPAROSCOPIC RADICAL PROSTATECTOMY N/A 08/04/2013   Procedure: ROBOTIC ASSISTED LAPAROSCOPIC RADICAL  PROSTATECTOMY LEVEL 2;  Surgeon: Dutch Gray, MD;  Location: [facility];  Service: Urology;  Laterality: N/A;   Past Medical History:  Diagnosis Date  . Anticoagulated    xarelto  . Depression   . Gait abnormality    MILD --- DOES NOT USE CANE  . 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 History of kidney stones   . 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 Hyperlipidemia   . Hypertension   . 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 Nocturia    twice  . Prostate cancer ([place])    UROLOGIST-- DR [name]---  Stage T2a, Gleason 4+3, PSA 5.4,  s/p  radical prostatectomy 2015;   05-14-2018  per pt PSA undetectable  . Pulmonary embolism, bilateral ([place]) 12/02/2017   currently taking xarelto  . Wears dentures    upper  . Wears glasses   . Wears hearing aid in both ears    There were no vitals taken for this visit.  Opioid Risk Score:   Fall Risk Score:  `1  Depression screen PHQ 2/9  Depression screen [facility] 2/9 10/22/2018 08/23/2018 04/05/2018 02/04/2018 11/03/2017 02/19/2017 12/12/2014  Decreased Interest 0 0 1 0 0 0 0  Down, Depressed, Hopeless 1 1 1  0 0 0 0  PHQ - 2 Score 1 1 2  0 0 0 0  Altered sleeping - - - - - - -  Tired, decreased energy - - - - - - -  Change in appetite - - - - - - -  Feeling bad or failure about yourself  - - - - - - -  Trouble concentrating - - - - - - -  Moving slowly or fidgety/restless - - - - - - -  Suicidal thoughts - - - - - - -  PHQ-9 Score - - - - - - -    Review of Systems  Constitutional: Negative.   HENT: Negative.   Eyes: Negative.   Respiratory: Negative.   Cardiovascular: Negative.   Gastrointestinal: Negative.   Endocrine: Negative.   Genitourinary: Negative.   Musculoskeletal: Positive for arthralgias, back pain and neck pain.       Spasms  Skin:  Negative.   Allergic/Immunologic: Negative.   Neurological: Positive for numbness.       Tingling   Psychiatric/Behavioral: Negative.   All other systems reviewed and are negative.  Objective:   Physical Exam Vitals and nursing note reviewed.  Constitutional:      Appearance: Normal appearance.  Neck:     Comments: Cervical Paraspinal Tenderness: C-5-C-6 Cardiovascular:     Rate and Rhythm: Normal rate and regular rhythm.     Pulses: Normal pulses.     Heart sounds: Normal heart sounds.  Pulmonary:     Effort: Pulmonary effort is normal.     Breath sounds: Normal breath sounds.  Musculoskeletal:     Cervical back: Normal range of motion and neck supple.     Comments: Normal Muscle Bulk and Muscle Testing Reveals:  Upper Extremities: Full ROM and Muscle Strength 5/5 Thoracic Paraspinal Tenderness: T-7-T-9  Lumbar Paraspinal Tenderess: L-3-L-5 Lower Extremities: Full ROM and Muscle Strength 5/5 Arises from Chair with ease Narrow Based  Gait   Skin:    General: Skin is warm and dry.  Neurological:     Mental Status: He is alert and oriented to person, place, and time.  Psychiatric:        Mood and Affect: Mood normal.        Behavior: Behavior normal.           Assessment & Plan:  1.Cervical Postlaminectomy: Continue HEP and Continue current medication regime.10/10/2019. 2. Cervicalgia/ Cervical Radiculitis: Continue current medication regime. Continue to Monitor.10/10/2019 3. Chronic BilateralShoulder Pain:Continue HEP as Tolerated.10/10/2018 4. L2 compression fracture/ Chronic Bilateral Low Back Pain without Sciatica: Continue to Monitor:10/10/2019 Refilled:Oxycodone 5 mg one tablet every 6hours as needed for moderate pain. #120. We will Continue the opioid monitoring program. This consists of regular clinic visits, examinations, urine drug screen, pill counts as well as use of [place] controlled substance reporting System. 5.  Polyarthralgia:No complaints today.Continue to Monitor.05/24//2021 6. Chronic Bilateral Thoracic Pain:Continue HEP as Tolerated. Continue Current medication regimen. Continue to Monitor.10/10/2019 .  32minutes of face to face patient care time was spent during this visit. All questions were encouraged and answered.  F/U in 1 month

## 2019-10-12 ENCOUNTER — Other Ambulatory Visit: Payer: Self-pay

## 2019-10-12 ENCOUNTER — Ambulatory Visit: Payer: Medicare Other | Admitting: Neurology

## 2019-10-12 ENCOUNTER — Encounter: Payer: Self-pay | Admitting: Neurology

## 2019-10-12 VITALS — BP 96/66 | HR 59 | Wt 182.5 lb

## 2019-10-12 DIAGNOSIS — R413 Other amnesia: Secondary | ICD-10-CM | POA: Diagnosis not present

## 2019-10-12 DIAGNOSIS — G250 Essential tremor: Secondary | ICD-10-CM

## 2019-10-12 HISTORY — DX: Essential tremor: G25.0

## 2019-10-12 NOTE — Patient Instructions (Signed)
Continue the aricept for the memory, consider addition of Namenda for memory as well.

## 2019-10-12 NOTE — Progress Notes (Signed)
Reason for visit: Memory disturbance, tremor  Referring physician: Dr. Gwen Pounds is a 69 y.o. male  History of present illness:  Fred [name] is a 69 year old right-handed white male with a history of a mild memory disturbance.  Fred [name] was last seen through this office about 2 years ago, he has been maintained on Aricept to 10 mg at night.  He has tolerated Fred medication well.  Over Fred last 2 years he has had some slight increased problems with forgetfulness.  Fred is still able to operate a motor vehicle, he denies any difficulty with safety issues or with directions.  He indicates that his wife helps him out with keeping up with medications and appointments, she does Fred finances and always has.  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  He has a very strong family history of Alzheimer's disease with dementia affecting his mother and a brother and a sister.  Over Fred last 6 months he has noted some intermittent tremor that affects both hands equally.  Fred tremor is present with activity of Fred hands, not present at rest.  He denies any tremor affecting Fred jaw or Fred head or neck or any vocal tremor.  He is not having a lot of troubles feeding himself or performing handwriting.  Fred [name] has had some issues with sleeping quite a bit during Fred day, he will go to bed and sleep 12 hours, sometimes up to 15 hours a day.  Fred [name] has a good energy level once he is awake during Fred day and he remains active.  He reports some bilateral foot numbness following a frostbite injury many years ago.  He does note some slight gait instability, he has not had any falls.  He reports no other focal numbness or weakness of Fred extremities.  He is sent to this office for further evaluation.   Past Medical History:  Diagnosis Date  . Anticoagulated    xarelto  . Benign essential tremor 10/12/2019  . Depression   .  Gait abnormality    MILD --- DOES NOT USE CANE  . 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 History of kidney stones   . 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 Hyperlipidemia   . Hypertension   . 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 Nocturia    twice  . Prostate cancer ([place])    UROLOGIST-- DR [name]---  Stage T2a, Gleason 4+3, PSA 5.4,  s/p  radical prostatectomy 2015;   05-14-2018  per pt PSA undetectable  . Pulmonary embolism, bilateral ([place]) 12/02/2017   currently taking xarelto  . Wears dentures    upper  . Wears glasses   . Wears hearing aid in both ears     Past Surgical History:  Procedure Laterality Date  . ANTERIOR CERVICAL DECOMP/DISCECTOMY FUSION  09-19-2010    dr Ronnald Ramp  @[facility]   . COLONOSCOPY W/ POLYPECTOMY  last one 03/ 2019  . CYSTOSCOPY N/A 05/17/2018   Procedure: CYSTOSCOPY/ REMOVAL OF BLADDER CALCULUS;  Surgeon: [name], MD;  Location: [facility];  Service: Urology;  Laterality: N/A;  ONLY NEEDS 45 MIN  .  FINGER SURGERY  1970s   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 RIGHT MIDDLE FINGER  . IMPLANTATION BONE ANCHORED HEARING AID Right 2011    @[facility]    "no longer have this"  . LYMPHADENECTOMY Bilateral 08/04/2013   Procedure: LYMPHADENECTOMY;  Surgeon: Dutch Gray, MD;  Location: [facility];  Service: Urology;  Laterality: Bilateral;  . PROSTATE SURGERY    . ROBOT ASSISTED LAPAROSCOPIC RADICAL PROSTATECTOMY N/A 08/04/2013   Procedure: ROBOTIC ASSISTED LAPAROSCOPIC RADICAL PROSTATECTOMY LEVEL 2;  Surgeon: Dutch Gray, MD;  Location: [facility];  Service: Urology;  Laterality: N/A;    Family History  Problem Relation Age of Onset  . Stroke Mother   . Dementia Mother   . Heart disease Father   . Stroke Father        heat stroke  . Kidney disease  Brother   . Dementia Brother     Social history:  reports that he quit smoking about 18 years ago. His smoking use included cigarettes. He has a 30.00 pack-year smoking history. He quit smokeless tobacco use about 18 years ago.  His smokeless tobacco use included chew and snuff. He reports previous alcohol use. He reports that he does not use drugs.  Medications:  Prior to Admission medications   Medication Sig Start Date End Date Taking? Authorizing Provider  albuterol (PROAIR HFA) 108 (90 Base) MCG/ACT inhaler Inhale 1-2 puffs into Fred lungs every 6 (six) hours as needed for wheezing or shortness of breath. 08/16/18  Yes Rigoberto Noel, MD  atorvastatin (LIPITOR) 40 MG tablet Take 20 mg by mouth at bedtime.  11/10/17  Yes [provider]  donepezil (ARICEPT) 10 MG tablet Take 10 mg by mouth daily. 11/10/17  Yes [provider]  DULoxetine (CYMBALTA) 60 MG capsule Take 60 mg by mouth 2 (two) times daily.    Yes [provider]  lisinopril (ZESTRIL) 20 MG tablet Take 20 mg by mouth 2 (two) times daily. 08/23/19  Yes [provider]  oxyCODONE (OXY IR/ROXICODONE) 5 MG immediate release tablet Take 1 tablet (5 mg total) by mouth every 6 (six) hours as needed for severe pain. 10/10/19  Yes Bayard Hugger, NP      Allergies  Allergen Reactions  . Lyrica [Pregabalin] Other (See Comments)    Falls asleep while talking to people  . Percodan [Oxycodone-Aspirin] Hives and Nausea And Vomiting    Per wife " this was 40 yrs ago"  Approx.  1970s    ROS:  Out of a complete 14 system review of symptoms, Fred [name] complains only of Fred following symptoms, and all other reviewed systems are negative.  Memory problems Numbness in Fred feet Tremor  Blood pressure 96/66, pulse (!) 59, weight 182 lb 8 oz (82.8 kg).  Physical Exam  General: Fred [name] is alert and cooperative at Fred time of Fred examination.  Eyes: Pupils are equal, round, and reactive to light.  Discs are flat bilaterally.  Neck: Fred neck is supple, no carotid bruits are noted.  Respiratory: Fred respiratory examination is clear.  Cardiovascular: Fred cardiovascular examination reveals a regular rate and rhythm, no obvious murmurs or rubs are noted.  Skin: Extremities are without significant edema.  Neurologic Exam  Mental status: Fred [name] is alert and oriented x 3 at Fred time of Fred examination. Fred Mini-Mental status examination done today shows a total score 26/30.  Fred [name] is able to name 9 four-legged animals in 60 seconds.  Cranial nerves: Facial symmetry  is present. There is good sensation of Fred face to pinprick and soft touch bilaterally. Fred strength of Fred facial muscles and Fred muscles to head turning and shoulder shrug are normal bilaterally. Speech is well enunciated, no aphasia or dysarthria is noted. Extraocular movements are full. Visual fields are full. Fred tongue is midline, and Fred [name] has symmetric elevation of Fred soft palate. No obvious hearing deficits are noted.  Motor: Fred motor testing reveals 5 over 5 strength of all 4 extremities. Good symmetric motor tone is noted throughout.  Sensory: Sensory testing is intact to pinprick, soft touch, vibration sensation, and position sense on all 4 extremities, with exception of decreased position sense in Fred right foot. No evidence of extinction is noted.  Coordination: Cerebellar testing reveals good finger-nose-finger and heel-to-shin bilaterally.  There is no evidence of a tremor on clinical examination.  When drawing a spiral, there is a mild tremor reflected in Fred handwriting.  Gait and station: Gait is normal. Tandem gait is slightly unsteady. Romberg is negative. No drift is seen.  Reflexes: Deep tendon reflexes are symmetric and normal bilaterally, with exception that Fred ankle jerk reflexes are depressed bilaterally. Toes are downgoing bilaterally.   MRI brain 06/08/17:  IMPRESSION: 1. No  acute intracranial abnormality. 2. 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 3. Mild chronic small vessel ischemic disease.  * MRI scan images were reviewed online. I agree with Fred written report.   Assessment/Plan:  1.  Mild memory disturbance  2.  Strong family history of Alzheimer's disease  3.  Tremor, likely essential tremor  Fred [name] has no symptoms of parkinsonism.  He has been given propranolol to take if needed for Fred tremor which is appropriate.  Fred [name] has been relatively stable on Aricept, he is scoring about Fred same now as he did 2 years ago on Fred Mini-Mental status examination.  We have discussed Fred possibility of adding Namenda to his regimen, they do not wish to do this currently but will call me if they decide otherwise.  He will follow-up in 6 months.  Fred Alexanders MD 10/12/2019 11:40 AM  [facility] [address] [place], [place] [address]  Phone [phone] Fax [phone]

## 2019-11-07 ENCOUNTER — Encounter: Payer: Self-pay | Admitting: Registered Nurse

## 2019-11-07 ENCOUNTER — Other Ambulatory Visit: Payer: Self-pay

## 2019-11-07 ENCOUNTER — Encounter: Payer: Medicare Other | Attending: Registered Nurse | Admitting: Registered Nurse

## 2019-11-07 VITALS — BP 134/85 | HR 60 | Temp 97.0°F | Ht 68.0 in | Wt 180.6 lb

## 2019-11-07 DIAGNOSIS — M25512 Pain in left shoulder: Secondary | ICD-10-CM | POA: Diagnosis not present

## 2019-11-07 DIAGNOSIS — M542 Cervicalgia: Secondary | ICD-10-CM | POA: Insufficient documentation

## 2019-11-07 DIAGNOSIS — Z87442 Personal history of urinary calculi: Secondary | ICD-10-CM | POA: Insufficient documentation

## 2019-11-07 DIAGNOSIS — M25511 Pain in right shoulder: Secondary | ICD-10-CM | POA: Insufficient documentation

## 2019-11-07 DIAGNOSIS — Z981 Arthrodesis status: Secondary | ICD-10-CM | POA: Insufficient documentation

## 2019-11-07 DIAGNOSIS — H9190 Unspecified hearing loss, unspecified ear: Secondary | ICD-10-CM | POA: Insufficient documentation

## 2019-11-07 DIAGNOSIS — E785 Hyperlipidemia, unspecified: Secondary | ICD-10-CM | POA: Insufficient documentation

## 2019-11-07 DIAGNOSIS — M4802 Spinal stenosis, cervical region: Secondary | ICD-10-CM | POA: Diagnosis present

## 2019-11-07 DIAGNOSIS — Z5181 Encounter for therapeutic drug level monitoring: Secondary | ICD-10-CM | POA: Insufficient documentation

## 2019-11-07 DIAGNOSIS — I10 Essential (primary) hypertension: Secondary | ICD-10-CM | POA: Diagnosis not present

## 2019-11-07 DIAGNOSIS — M961 Postlaminectomy syndrome, not elsewhere classified: Secondary | ICD-10-CM

## 2019-11-07 DIAGNOSIS — M5412 Radiculopathy, cervical region: Secondary | ICD-10-CM

## 2019-11-07 DIAGNOSIS — Z841 Family history of disorders of kidney and ureter: Secondary | ICD-10-CM | POA: Insufficient documentation

## 2019-11-07 DIAGNOSIS — Z79891 Long term (current) use of opiate analgesic: Secondary | ICD-10-CM | POA: Diagnosis not present

## 2019-11-07 DIAGNOSIS — G8929 Other chronic pain: Secondary | ICD-10-CM | POA: Insufficient documentation

## 2019-11-07 DIAGNOSIS — Z87891 Personal history of nicotine dependence: Secondary | ICD-10-CM | POA: Insufficient documentation

## 2019-11-07 DIAGNOSIS — G894 Chronic pain syndrome: Secondary | ICD-10-CM | POA: Diagnosis present

## 2019-11-07 DIAGNOSIS — Z823 Family history of stroke: Secondary | ICD-10-CM | POA: Insufficient documentation

## 2019-11-07 DIAGNOSIS — Z8249 Family history of ischemic heart disease and other diseases of the circulatory system: Secondary | ICD-10-CM | POA: Diagnosis not present

## 2019-11-07 DIAGNOSIS — M546 Pain in thoracic spine: Secondary | ICD-10-CM

## 2019-11-07 DIAGNOSIS — M545 Low back pain: Secondary | ICD-10-CM

## 2019-11-07 MED ORDER — OXYCODONE HCL 5 MG PO TABS: 5.0000 mg | ORAL_TABLET | Freq: Four times a day (QID) | ORAL | 0 refills | Status: DC | PRN

## 2019-11-07 NOTE — Progress Notes (Signed)
Subjective:    Patient ID: Fred [name], male    DOB: 04-Feb-1951, 69 y.o.   MRN: [id number]  HPI: Fred [name] is a 70 y.o. male who returns for follow up appointment for chronic pain and medication refill. He states his pain is located in his neck radiating into his bilateral shoulders and mid- lower back pain. He  Rates his  Pain 4. His current exercise regime is walking, outside chores and performing stretching exercises.  Fred [name] Morphine equivalent is 30.00 MME.  Oral Swab was Performed Today.    Pain Inventory Average Pain 5 Pain Right Now 4 My pain is constant, sharp and stabbing  In the last 24 hours, has pain interfered with the following? General activity 5 Relation with others 6 Enjoyment of life 6 What TIME of day is your pain at its worst? daytime, evening & night. Sleep (in general) Fair  Pain is worse with: walking, bending and standing Pain improves with: medication and TENS Relief from Meds: 4  Mobility walk without assistance how many minutes can you walk? 15-20 mins. ability to climb steps?  yes do you drive?  yes Do you have any goals in this area?  no  Function disabled: date disabled 2000 retired  Neuro/Psych tremor  Prior Studies Any changes since last visit?  no  Physicians involved in your care Any changes since last visit?  no   Family History  Problem Relation Age of Onset  . Stroke Mother   . Dementia Mother   . Heart disease Father   . Stroke Father        heat stroke  . Kidney disease Brother   . Dementia Brother    Social History   Socioeconomic History  . Marital status: Married    Spouse name: Not on file  . Number of children: 6  . Years of education: 12+  . Highest education level: Not on file  Occupational History  . Occupation: Retired  Tobacco Use  . Smoking status: Former Smoker    Packs/day: 1.00    Years: 30.00    Pack years: 30.00    Types: Cigarettes    Quit date: 05/25/2001    Years since  quitting: 18.4  . Smokeless tobacco: Former Systems developer    Types: Chew, Snuff    Quit date: 09/22/2001  Vaping Use  . Vaping Use: Never used  Substance and Sexual Activity  . Alcohol use: Not Currently  . Drug use: No  . Sexual activity: Not on file  Other Topics Concern  . Not on file  Social History Narrative   Lives with wife   Caffeine use: Coffee daily (1-2 per day)   Some soda   Right handed    Social Determinants of Health   Financial Resource Strain:   . Difficulty of Paying Living Expenses:   Food Insecurity:   . Worried About Charity fundraiser in the Last Year:   . Arboriculturist in the Last Year:   Transportation Needs:   . Film/video editor (Medical):   Marland Kitchen Lack of Transportation (Non-Medical):   Physical Activity:   . Days of Exercise per Week:   . Minutes of Exercise per Session:   Stress:   . Feeling of Stress :   Social Connections:   . Frequency of Communication with Friends and Family:   . Frequency of Social Gatherings with Friends and Family:   . Attends Religious Services:   . Active  Member of Clubs or Organizations:   . Attends Archivist Meetings:   Marland Kitchen Marital Status:    Past Surgical History:  Procedure Laterality Date  . ANTERIOR CERVICAL DECOMP/DISCECTOMY FUSION  09-19-2010    dr Ronnald Ramp  @[facility]   . COLONOSCOPY W/ POLYPECTOMY  last one 03/ 2019  . CYSTOSCOPY N/A 05/17/2018   Procedure: CYSTOSCOPY/ REMOVAL OF BLADDER CALCULUS;  Surgeon: [name], MD;  Location[facility];  Service: Urology;  Laterality: N/A;  ONLY NEEDS 45 MIN  . FINGER SURGERY  1970s   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 RIGHT MIDDLE FINGER  . IMPLANTATION BONE ANCHORED HEARING AID Right 2011    @[facility]    "no longer have this"  . LYMPHADENECTOMY Bilateral 08/04/2013   Procedure: LYMPHADENECTOMY;  Surgeon: Dutch Gray, MD;  [facility];  Service: Urology;  Laterality: Bilateral;  . PROSTATE SURGERY    . ROBOT ASSISTED LAPAROSCOPIC RADICAL  PROSTATECTOMY N/A 08/04/2013   Procedure: ROBOTIC ASSISTED LAPAROSCOPIC RADICAL PROSTATECTOMY LEVEL 2;  Surgeon: Dutch Gray, MD;  [facility];  Service: Urology;  Laterality: N/A;   Past Medical History:  Diagnosis Date  . Anticoagulated    xarelto  . Benign essential tremor 10/12/2019  . Depression   . Gait abnormality    MILD --- DOES NOT USE CANE  . 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 History of kidney stones   . 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 Hyperlipidemia   . Hypertension   . 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 Nocturia    twice  . Prostate cancer ([place])    UROLOGIST-- DR [name]---  Stage T2a, Gleason 4+3, PSA 5.4,  s/p  radical prostatectomy 2015;   05-14-2018  per pt PSA undetectable  . Pulmonary embolism, bilateral ([place]) 12/02/2017   currently taking xarelto  . Wears dentures    upper  . Wears glasses   . Wears hearing aid in both ears    BP 134/85   Pulse 60   Temp (!) 97 F (36.1 C)   Ht 5\' 8"  (1.727 m)   Wt 180 lb 9.6 oz (81.9 kg)   SpO2 95%   BMI 27.46 kg/m   Opioid Risk Score:   Fall Risk Score:  `1  Depression screen PHQ 2/9  Depression screen [facility] 2/9 10/22/2018 08/23/2018 04/05/2018 02/04/2018 11/03/2017 02/19/2017 12/12/2014  Decreased Interest 0 0 1 0 0 0 0  Down, Depressed, Hopeless 1 1 1  0 0 0 0  PHQ - 2 Score 1 1 2  0 0 0 0  Altered sleeping - - - - - - -  Tired, decreased energy - - - - - - -  Change in appetite - - - - - - -  Feeling bad or failure about yourself  - - - - - - -  Trouble concentrating - - - - - - -  Moving slowly or fidgety/restless - - - - - - -  Suicidal thoughts - - - - - - -  PHQ-9 Score - - - - - - -   Review of Systems  Constitutional: Negative.   HENT: Negative.   Eyes: Negative.   Respiratory:  Negative.   Cardiovascular: Negative.  Gastrointestinal: Negative.   Endocrine: Negative.   Genitourinary: Negative.   Musculoskeletal:       Hand shakes  Skin: Negative.   Allergic/Immunologic: Negative.   Neurological: Negative.   Hematological: Negative.   Psychiatric/Behavioral: Negative.        Objective:   Physical Exam Vitals and nursing note reviewed.  Constitutional:      Appearance: Normal appearance.  Neck:     Comments: Cervical Paraspinal Tenderness: C-5-C-6 Cardiovascular:     Rate and Rhythm: Normal rate and regular rhythm.     Pulses: Normal pulses.     Heart sounds: Normal heart sounds.  Pulmonary:     Effort: Pulmonary effort is normal.     Breath sounds: Normal breath sounds.  Musculoskeletal:     Cervical back: Normal range of motion and neck supple.     Comments: Normal Muscle Bulk and Muscle Testing Reveals:  Upper Extremities: Full ROM and Muscle Strength 5/5 Bilateral AC Joint Tenderness Lumbar Paraspinal Tenderness: L-3-L-4 Lower Extremities: Full ROM and Muscle Strength 5/5 Arises from Table with ease Narrow Based Gait   Skin:    General: Skin is warm and dry.  Neurological:     Mental Status: He is alert and oriented to person, place, and time.  Psychiatric:        Mood and Affect: Mood normal.        Behavior: Behavior normal.           Assessment & Plan:  1.Cervical Postlaminectomy: Continue HEP and Continue current medication regime.11/07/2019. 2. Cervicalgia/ Cervical Radiculitis: Continue current medication regime. Continue to Monitor.11/07/2019 3. Chronic BilateralShoulder Pain:Continue HEP as Tolerated.11/07/2018 4. L2 compression fracture/ Chronic Bilateral Low Back Pain without Sciatica: Continue to Monitor:11/07/2019 Refilled:Oxycodone 5 mg one tablet every 6hours as needed for moderate pain. #120. We will Continue the opioid monitoring program. This consists of regular clinic visits, examinations, urine drug  screen, pill counts as well as use of [place] controlled substance reporting System. 5. Polyarthralgia:No complaints today.Continue to Monitor.06/21//2021 6. Chronic Bilateral Thoracic Pain:Continue HEP as Tolerated. Continue Current medication regimen. Continue to Monitor.11/07/2019 .  71minutes of face to face patient care time was spent during this visit. All questions were encouraged and answered.  F/U in 1 month

## 2019-11-11 LAB — DRUG TOX MONITOR 1 W/CONF, ORAL FLD
Amphetamines: NEGATIVE ng/mL (ref <10)
Barbiturates: NEGATIVE ng/mL (ref <10)
Benzodiazepines: NEGATIVE ng/mL (ref <0.50)
Buprenorphine: NEGATIVE ng/mL (ref <0.10)
Cocaine: NEGATIVE ng/mL (ref <5.0)
Codeine: NEGATIVE ng/mL (ref <2.5)
Cotinine: 167.9 ng/mL — ABNORMAL HIGH (ref <5.0)
Dihydrocodeine: NEGATIVE ng/mL (ref <2.5)
Fentanyl: NEGATIVE ng/mL (ref <0.10)
Heroin Metabolite: NEGATIVE ng/mL (ref <1.0)
Hydrocodone: NEGATIVE ng/mL (ref <2.5)
Hydromorphone: NEGATIVE ng/mL (ref <2.5)
MARIJUANA: NEGATIVE ng/mL (ref <2.5)
MDMA: NEGATIVE ng/mL (ref <10)
Meprobamate: NEGATIVE ng/mL (ref <2.5)
Methadone: NEGATIVE ng/mL (ref <5.0)
Morphine: NEGATIVE ng/mL (ref <2.5)
Nicotine Metabolite: POSITIVE ng/mL — AB (ref <5.0)
Norhydrocodone: NEGATIVE ng/mL (ref <2.5)
Noroxycodone: 10.3 ng/mL — ABNORMAL HIGH (ref <2.5)
Opiates: POSITIVE ng/mL — AB (ref <2.5)
Oxycodone: >250 ng/mL — ABNORMAL HIGH (ref <2.5)
Oxymorphone: NEGATIVE ng/mL (ref <2.5)
Phencyclidine: NEGATIVE ng/mL (ref <10)
Tapentadol: NEGATIVE ng/mL (ref <5.0)
Tramadol: NEGATIVE ng/mL (ref <5.0)
Zolpidem: NEGATIVE ng/mL (ref <5.0)

## 2019-11-11 LAB — DRUG TOX ALC METAB W/CON, ORAL FLD: Alcohol Metabolite: NEGATIVE ng/mL (ref <25)

## 2019-11-14 ENCOUNTER — Telehealth: Payer: Self-pay | Admitting: *Deleted

## 2019-11-14 NOTE — Telephone Encounter (Signed)
Oral swab drug screen was consistent for prescribed medications.  ?

## 2019-12-02 ENCOUNTER — Encounter: Payer: Self-pay | Admitting: Registered Nurse

## 2019-12-02 ENCOUNTER — Other Ambulatory Visit: Payer: Self-pay

## 2019-12-02 ENCOUNTER — Telehealth: Payer: Self-pay | Admitting: Registered Nurse

## 2019-12-02 ENCOUNTER — Encounter: Payer: Medicare Other | Attending: Registered Nurse | Admitting: Registered Nurse

## 2019-12-02 VITALS — BP 170/117 | HR 62 | Temp 97.9°F | Ht 68.0 in | Wt 180.2 lb

## 2019-12-02 DIAGNOSIS — G8929 Other chronic pain: Secondary | ICD-10-CM | POA: Insufficient documentation

## 2019-12-02 DIAGNOSIS — M5412 Radiculopathy, cervical region: Secondary | ICD-10-CM | POA: Diagnosis not present

## 2019-12-02 DIAGNOSIS — Z5181 Encounter for therapeutic drug level monitoring: Secondary | ICD-10-CM | POA: Insufficient documentation

## 2019-12-02 DIAGNOSIS — M545 Low back pain: Secondary | ICD-10-CM

## 2019-12-02 DIAGNOSIS — M25512 Pain in left shoulder: Secondary | ICD-10-CM | POA: Diagnosis not present

## 2019-12-02 DIAGNOSIS — M4802 Spinal stenosis, cervical region: Secondary | ICD-10-CM | POA: Diagnosis present

## 2019-12-02 DIAGNOSIS — Z841 Family history of disorders of kidney and ureter: Secondary | ICD-10-CM | POA: Insufficient documentation

## 2019-12-02 DIAGNOSIS — I10 Essential (primary) hypertension: Secondary | ICD-10-CM | POA: Insufficient documentation

## 2019-12-02 DIAGNOSIS — E785 Hyperlipidemia, unspecified: Secondary | ICD-10-CM | POA: Diagnosis not present

## 2019-12-02 DIAGNOSIS — Z823 Family history of stroke: Secondary | ICD-10-CM | POA: Diagnosis not present

## 2019-12-02 DIAGNOSIS — Z79891 Long term (current) use of opiate analgesic: Secondary | ICD-10-CM | POA: Insufficient documentation

## 2019-12-02 DIAGNOSIS — M546 Pain in thoracic spine: Secondary | ICD-10-CM

## 2019-12-02 DIAGNOSIS — Z8249 Family history of ischemic heart disease and other diseases of the circulatory system: Secondary | ICD-10-CM | POA: Insufficient documentation

## 2019-12-02 DIAGNOSIS — M25511 Pain in right shoulder: Secondary | ICD-10-CM | POA: Insufficient documentation

## 2019-12-02 DIAGNOSIS — Z87891 Personal history of nicotine dependence: Secondary | ICD-10-CM | POA: Diagnosis not present

## 2019-12-02 DIAGNOSIS — M542 Cervicalgia: Secondary | ICD-10-CM | POA: Diagnosis not present

## 2019-12-02 DIAGNOSIS — G894 Chronic pain syndrome: Secondary | ICD-10-CM | POA: Diagnosis present

## 2019-12-02 DIAGNOSIS — Z981 Arthrodesis status: Secondary | ICD-10-CM | POA: Insufficient documentation

## 2019-12-02 DIAGNOSIS — H9190 Unspecified hearing loss, unspecified ear: Secondary | ICD-10-CM | POA: Insufficient documentation

## 2019-12-02 DIAGNOSIS — M961 Postlaminectomy syndrome, not elsewhere classified: Secondary | ICD-10-CM | POA: Diagnosis not present

## 2019-12-02 DIAGNOSIS — Z87442 Personal history of urinary calculi: Secondary | ICD-10-CM | POA: Diagnosis not present

## 2019-12-02 MED ORDER — OXYCODONE HCL 5 MG PO TABS: 5.0000 mg | ORAL_TABLET | Freq: Four times a day (QID) | ORAL | 0 refills | Status: DC | PRN

## 2019-12-02 NOTE — Progress Notes (Signed)
Subjective:    Patient ID: Fred [name], male    DOB: [birth year], 69 y.o.   MRN: [id number]  HPI: Fred [name] is a 69 y.o. male who returns for follow up appointment for chronic pain and medication refill. He states his pain is located in his neck radiating into his bilateral shoulders and mid- lower back pain. He  rates his pain 4. His current exercise regime is walking and performing stretching exercises.  Mr. Archambeau arrived to office with uncontrolled hypertension, his blood pressure was rechecked. This provider placed a call to Mrs. Nikkel, Mr Manlove was reporting he only takes his lisinopril daily according to his medication list it states twice a day. I asked Mrs. Walkowiak to assist Mr. Sevin with his medications going forward she verbalizes understanding. She also stated she put his medication out for him this morning and she's not sure if he took his medication, she will check when they go home. She was also instructed to call Mr. Winne PCP regarding the above, she states she will call his PCP. She was instructed to call office with an update, se verbalizes understanding and agrees to call with an update.   Mr. Gonzaga Morphine equivalent is 30.00  MME.  Last Oral Swab was Performed on 11/07/2019, it was consistent.    Pain Inventory Average Pain 6 Pain Right Now 4 My pain is constant, sharp, stabbing and aching  In the last 24 hours, has pain interfered with the following? General activity 5 Relation with others 6 Enjoyment of life 6 What TIME of day is your pain at its worst? daytime, evening, night Sleep (in general) Good  Pain is worse with: walking, bending, standing and some activites Pain improves with: rest, medication and TENS Relief from Meds: 5  Mobility walk without assistance how many minutes can you walk? 30 ability to climb steps?  yes do you drive?  yes  Function disabled: date disabled 2000 I need assistance with the following:  Wife  helps out at home. Do you have any goals in this area?  yes  Neuro/Psych tremor trouble walking depression  Prior Studies Any changes since last visit?  no  Physicians involved in your care Any changes since last visit?  no   Family History  Problem Relation Age of Onset  . Stroke Mother   . Dementia Mother   . Heart disease Father   . Stroke Father        heat stroke  . Kidney disease Brother   . Dementia Brother    Social History   Socioeconomic History  . Marital status: Married    Spouse name: Not on file  . Number of children: 6  . Years of education: 12+  . Highest education level: Not on file  Occupational History  . Occupation: Retired  Tobacco Use  . Smoking status: Former Smoker    Packs/day: 1.00    Years: 30.00    Pack years: 30.00    Types: Cigarettes    Quit date: 05/25/2001    Years since quitting: 18.5  . Smokeless tobacco: Former Systems developer    Types: Chew, Snuff    Quit date: 09/22/2001  Vaping Use  . Vaping Use: Never used  Substance and Sexual Activity  . Alcohol use: Not Currently  . Drug use: No  . Sexual activity: Not on file  Other Topics Concern  . Not on file  Social History Narrative   Lives with wife   Caffeine use:  Coffee daily (1-2 per day)   Some soda   Right handed    Social Determinants of Health   Financial Resource Strain:   . Difficulty of Paying Living Expenses:   Food Insecurity:   . Worried About Charity fundraiser in the Last Year:   . Arboriculturist in the Last Year:   Transportation Needs:   . Film/video editor (Medical):   Marland Kitchen Lack of Transportation (Non-Medical):   Physical Activity:   . Days of Exercise per Week:   . Minutes of Exercise per Session:   Stress:   . Feeling of Stress :   Social Connections:   . Frequency of Communication with Friends and Family:   . Frequency of Social Gatherings with Friends and Family:   . Attends Religious Services:   . Active Member of Clubs or Organizations:   .  Attends Archivist Meetings:   Marland Kitchen Marital Status:    Past Surgical History:  Procedure Laterality Date  . ANTERIOR CERVICAL DECOMP/DISCECTOMY FUSION  09-19-2010    dr Ronnald Ramp  @[facility]   . COLONOSCOPY W/ POLYPECTOMY  last one 03/ 2019  . CYSTOSCOPY N/A 05/17/2018   Procedure: CYSTOSCOPY/ REMOVAL OF BLADDER CALCULUS;  Surgeon: [name], MD;  Location: [facility];  Service: Urology;  Laterality: N/A;  ONLY NEEDS 45 MIN  . FINGER SURGERY  1970s   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 RIGHT MIDDLE FINGER  . IMPLANTATION BONE ANCHORED HEARING AID Right 2011    @[facility]    "no longer have this"  . LYMPHADENECTOMY Bilateral 08/04/2013   Procedure: LYMPHADENECTOMY;  Surgeon: Dutch Gray, MD;  Location: [facility];  Service: Urology;  Laterality: Bilateral;  . PROSTATE SURGERY    . ROBOT ASSISTED LAPAROSCOPIC RADICAL PROSTATECTOMY N/A 08/04/2013   Procedure: ROBOTIC ASSISTED LAPAROSCOPIC RADICAL PROSTATECTOMY LEVEL 2;  Surgeon: Dutch Gray, MD;  Location: [facility];  Service: Urology;  Laterality: N/A;   Past Medical History:  Diagnosis Date  . Anticoagulated    xarelto  . Benign essential tremor 10/12/2019  . Depression   . Gait abnormality    MILD --- DOES NOT USE CANE  . 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 History of kidney stones   . 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 Hyperlipidemia   . Hypertension   . 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 Nocturia    twice  . Prostate cancer ([place])    UROLOGIST-- DR [name]---  Stage T2a, Gleason 4+3, PSA 5.4,  s/p  radical prostatectomy 2015;   05-14-2018  per pt PSA undetectable  . Pulmonary embolism, bilateral ([place]) 12/02/2017   currently taking xarelto  . Wears dentures    upper  . Wears glasses   . Wears hearing aid  in both ears    There were no vitals taken for this visit.  Opioid Risk Score:   Fall Risk Score:  `1  Depression screen PHQ 2/9  Depression screen [facility] 2/9 11/07/2019 10/22/2018 08/23/2018 04/05/2018 02/04/2018 11/03/2017 02/19/2017  Decreased Interest 0 0 0 1 0 0 0  Down, Depressed, Hopeless 0 1 1 1  0 0 0  PHQ - 2 Score 0 1 1 2  0 0 0  Altered sleeping  0 - - - - - -  Tired, decreased energy 0 - - - - - -  Change in appetite 0 - - - - - -  Feeling bad or failure about yourself  0 - - - - - -  Trouble concentrating 0 - - - - - -  Moving slowly or fidgety/restless 0 - - - - - -  Suicidal thoughts 0 - - - - - -  PHQ-9 Score 0 - - - - - -   Review of Systems  Constitutional: Negative.   HENT: Negative.   Eyes: Negative.   Respiratory: Negative.   Cardiovascular: Negative.   Gastrointestinal: Negative.   Endocrine: Negative.   Genitourinary: Negative.   Musculoskeletal: Positive for gait problem.       Off balance when walking  Skin: Negative.   Allergic/Immunologic: Negative.   Hematological: Negative.   Psychiatric/Behavioral: Negative.        Objective:   Physical Exam Vitals and nursing note reviewed.  Constitutional:      Appearance: Normal appearance.  Neck:     Comments: Cervical Paraspinal Tenderness: C-5-C-6  Cardiovascular:     Rate and Rhythm: Normal rate and regular rhythm.     Pulses: Normal pulses.     Heart sounds: Normal heart sounds.  Pulmonary:     Effort: Pulmonary effort is normal.     Breath sounds: Normal breath sounds.  Musculoskeletal:     Cervical back: Normal range of motion and neck supple.     Comments: Normal Muscle Bulk and Muscle Testing Reveals:  Upper Extremities: Full ROM and Muscle Strength 5/5 Bilateral AC Joint Tenderness  Thoracic Paraspinal Tenderness: T-7-T-9  Lumbar Paraspinal Tenderness: L-3-L-5 Lower Extremities: Full ROM and Muscle Strength 5/5 Arises from Table slowly Narrow Based  Gait   Skin:    General: Skin is warm  and dry.  Neurological:     Mental Status: He is alert and oriented to person, place, and time.  Psychiatric:        Mood and Affect: Mood normal.        Behavior: Behavior normal.           Assessment & Plan:  1.Cervical Postlaminectomy: Continue HEP and Continue current medication regime.12/02/2019. 2. Cervicalgia/ Cervical Radiculitis: Continue current medication regime. Continue to Monitor.12/02/2019 3. Chronic BilateralShoulder Pain:Continue HEP as Tolerated.12/02/2018 4. L2 compression fracture/ Chronic Bilateral Low Back Pain without Sciatica: Continue to Monitor:12/02/2019 Refilled:Oxycodone 5 mg one tablet every 6hours as needed for moderate pain. #120. We will Continue the opioid monitoring program. This consists of regular clinic visits, examinations, urine drug screen, pill counts as well as use of [place] controlled substance reporting System. 5. Polyarthralgia:No complaints today.Continue to Monitor.07/16//2021 6. Chronic Bilateral Thoracic Pain:Continue HEP as Tolerated. Continue Current medication regimen. Continue to Monitor.12/02/2019 .7. Uncontrolled Hypertension: Blood Pressure was re-checked. Placed a call to Mrs. Alcorta. See HPI.   84minutes of face to face patient care time was spent during this visit. All questions were encouraged and answered.  F/U in 1 month

## 2019-12-02 NOTE — Telephone Encounter (Signed)
Placed a call to Mrs. Campanile, she states Mr. Awad didn't take his antihypertensive medication this morning. She gave him his medication and he rested. She checked his blood pressure around 2:00-2:30 and his blood pressure was 130/83. Also reports she has set up his medication for the week.

## 2019-12-30 ENCOUNTER — Encounter: Payer: Medicare Other | Attending: Registered Nurse | Admitting: Registered Nurse

## 2019-12-30 ENCOUNTER — Other Ambulatory Visit: Payer: Self-pay

## 2019-12-30 VITALS — BP 138/89 | HR 59 | Temp 98.1°F | Ht 68.0 in | Wt 183.6 lb

## 2019-12-30 DIAGNOSIS — M542 Cervicalgia: Secondary | ICD-10-CM | POA: Insufficient documentation

## 2019-12-30 DIAGNOSIS — M25512 Pain in left shoulder: Secondary | ICD-10-CM | POA: Diagnosis not present

## 2019-12-30 DIAGNOSIS — Z8249 Family history of ischemic heart disease and other diseases of the circulatory system: Secondary | ICD-10-CM | POA: Insufficient documentation

## 2019-12-30 DIAGNOSIS — I10 Essential (primary) hypertension: Secondary | ICD-10-CM | POA: Diagnosis not present

## 2019-12-30 DIAGNOSIS — G894 Chronic pain syndrome: Secondary | ICD-10-CM | POA: Insufficient documentation

## 2019-12-30 DIAGNOSIS — Z87442 Personal history of urinary calculi: Secondary | ICD-10-CM | POA: Diagnosis not present

## 2019-12-30 DIAGNOSIS — M4802 Spinal stenosis, cervical region: Secondary | ICD-10-CM | POA: Insufficient documentation

## 2019-12-30 DIAGNOSIS — G8929 Other chronic pain: Secondary | ICD-10-CM | POA: Insufficient documentation

## 2019-12-30 DIAGNOSIS — Z823 Family history of stroke: Secondary | ICD-10-CM | POA: Insufficient documentation

## 2019-12-30 DIAGNOSIS — H9190 Unspecified hearing loss, unspecified ear: Secondary | ICD-10-CM | POA: Diagnosis not present

## 2019-12-30 DIAGNOSIS — Z841 Family history of disorders of kidney and ureter: Secondary | ICD-10-CM | POA: Insufficient documentation

## 2019-12-30 DIAGNOSIS — M5412 Radiculopathy, cervical region: Secondary | ICD-10-CM | POA: Diagnosis not present

## 2019-12-30 DIAGNOSIS — E785 Hyperlipidemia, unspecified: Secondary | ICD-10-CM | POA: Diagnosis not present

## 2019-12-30 DIAGNOSIS — M961 Postlaminectomy syndrome, not elsewhere classified: Secondary | ICD-10-CM

## 2019-12-30 DIAGNOSIS — M25511 Pain in right shoulder: Secondary | ICD-10-CM | POA: Insufficient documentation

## 2019-12-30 DIAGNOSIS — Z5181 Encounter for therapeutic drug level monitoring: Secondary | ICD-10-CM | POA: Diagnosis present

## 2019-12-30 DIAGNOSIS — Z87891 Personal history of nicotine dependence: Secondary | ICD-10-CM | POA: Insufficient documentation

## 2019-12-30 DIAGNOSIS — Z79891 Long term (current) use of opiate analgesic: Secondary | ICD-10-CM | POA: Insufficient documentation

## 2019-12-30 DIAGNOSIS — M545 Low back pain: Secondary | ICD-10-CM

## 2019-12-30 DIAGNOSIS — Z981 Arthrodesis status: Secondary | ICD-10-CM | POA: Insufficient documentation

## 2019-12-30 MED ORDER — OXYCODONE HCL 5 MG PO TABS: 5.0000 mg | ORAL_TABLET | Freq: Four times a day (QID) | ORAL | 0 refills | Status: DC | PRN

## 2019-12-30 NOTE — Progress Notes (Signed)
Subjective:    Patient ID: Fred [name], male    DOB: 04-27-1951, 70 y.o.   MRN: [id number]  HPI: Fred [name] is a 69 y.o. male who returns for follow up appointment for chronic pain and medication refill. He states his  pain is located in his neck radiating into his bilateral shoulders and lower back pain. He rates his pain 5. His current exercise regime is walking and performing stretching exercises.  Mr. Train Morphine equivalent is 30.00  MME.    Last Oral Swab was Performed on 11/07/2019, it was consistent.   Pain Inventory Average Pain 6 Pain Right Now 5 My pain is sharp, burning, stabbing, tingling and aching  In the last 24 hours, has pain interfered with the following? General activity 7 Relation with others 7 Enjoyment of life 7 What TIME of day is your pain at its worst? daytime, evening and night Sleep (in general) Fair  Pain is worse with: walking, bending, standing and some activites Pain improves with: medication and TENS Relief from Meds: 6  Family History  Problem Relation Age of Onset  . Stroke Mother   . Dementia Mother   . Heart disease Father   . Stroke Father        heat stroke  . Kidney disease Brother   . Dementia Brother    Social History   Socioeconomic History  . Marital status: Married    Spouse name: Not on file  . Number of children: 6  . Years of education: 12+  . Highest education level: Not on file  Occupational History  . Occupation: Retired  Tobacco Use  . Smoking status: Former Smoker    Packs/day: 1.00    Years: 30.00    Pack years: 30.00    Types: Cigarettes    Quit date: 05/25/2001    Years since quitting: 18.6  . Smokeless tobacco: Former Systems developer    Types: Chew, Snuff    Quit date: 09/22/2001  Vaping Use  . Vaping Use: Never used  Substance and Sexual Activity  . Alcohol use: Not Currently  . Drug use: No  . Sexual activity: Not on file  Other Topics Concern  . Not on file  Social History Narrative    Lives with wife   Caffeine use: Coffee daily (1-2 per day)   Some soda   Right handed    Social Determinants of Health   Financial Resource Strain:   . Difficulty of Paying Living Expenses:   Food Insecurity:   . Worried About Charity fundraiser in the Last Year:   . Arboriculturist in the Last Year:   Transportation Needs:   . Film/video editor (Medical):   Marland Kitchen Lack of Transportation (Non-Medical):   Physical Activity:   . Days of Exercise per Week:   . Minutes of Exercise per Session:   Stress:   . Feeling of Stress :   Social Connections:   . Frequency of Communication with Friends and Family:   . Frequency of Social Gatherings with Friends and Family:   . Attends Religious Services:   . Active Member of Clubs or Organizations:   . Attends Archivist Meetings:   Marland Kitchen Marital Status:    Past Surgical History:  Procedure Laterality Date  . ANTERIOR CERVICAL DECOMP/DISCECTOMY FUSION  09-19-2010    dr Ronnald Ramp  @[facility]   . COLONOSCOPY W/ POLYPECTOMY  last one 03/ 2019  . CYSTOSCOPY N/A 05/17/2018   Procedure:  CYSTOSCOPY/ REMOVAL OF BLADDER CALCULUS;  Surgeon: [name], MD;  Location: [facility];  Service: Urology;  Laterality: N/A;  ONLY NEEDS 45 MIN  . FINGER SURGERY  1970s   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 RIGHT MIDDLE FINGER  . IMPLANTATION BONE ANCHORED HEARING AID Right 2011    @[facility]    "no longer have this"  . LYMPHADENECTOMY Bilateral 08/04/2013   Procedure: LYMPHADENECTOMY;  Surgeon: Dutch Gray, MD;  Location: [facility];  Service: Urology;  Laterality: Bilateral;  . PROSTATE SURGERY    . ROBOT ASSISTED LAPAROSCOPIC RADICAL PROSTATECTOMY N/A 08/04/2013   Procedure: ROBOTIC ASSISTED LAPAROSCOPIC RADICAL PROSTATECTOMY LEVEL 2;  Surgeon: Dutch Gray, MD;  Location: [facility];  Service: Urology;  Laterality: N/A;   Past Surgical History:  Procedure Laterality Date  . ANTERIOR CERVICAL DECOMP/DISCECTOMY FUSION  09-19-2010    dr Ronnald Ramp  @[facility]     . COLONOSCOPY W/ POLYPECTOMY  last one 03/ 2019  . CYSTOSCOPY N/A 05/17/2018   Procedure: CYSTOSCOPY/ REMOVAL OF BLADDER CALCULUS;  Surgeon: [name], MD;  [facility];  Service: Urology;  Laterality: N/A;  ONLY NEEDS 45 MIN  . FINGER SURGERY  1970s   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 RIGHT MIDDLE FINGER  . IMPLANTATION BONE ANCHORED HEARING AID Right 2011    @[facility]    "no longer have this"  . LYMPHADENECTOMY Bilateral 08/04/2013   Procedure: LYMPHADENECTOMY;  Surgeon: Dutch Gray, MD;  [facility];  Service: Urology;  Laterality: Bilateral;  . PROSTATE SURGERY    . ROBOT ASSISTED LAPAROSCOPIC RADICAL PROSTATECTOMY N/A 08/04/2013   Procedure: ROBOTIC ASSISTED LAPAROSCOPIC RADICAL PROSTATECTOMY LEVEL 2;  Surgeon: Dutch Gray, MD;  Location[facility];  Service: Urology;  Laterality: N/A;   Past Medical History:  Diagnosis Date  . Anticoagulated    xarelto  . Benign essential tremor 10/12/2019  . Depression   . Gait abnormality    MILD --- DOES NOT USE CANE  . 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 History of kidney stones   . 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 Hyperlipidemia   . Hypertension   . 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 Nocturia    twice  . Prostate cancer ([place])    UROLOGIST-- DR [name]---  Stage T2a, Gleason 4+3, PSA 5.4,  s/p  radical prostatectomy 2015;   05-14-2018  per pt PSA undetectable  . Pulmonary embolism, bilateral ([place]) 12/02/2017   currently taking xarelto  . Wears dentures    upper  . Wears glasses   . Wears hearing aid in both ears    BP 138/89   Pulse (!) 59   Temp 98.1 F (36.7 C)   Ht 5\' 8"  (1.727 m)   Wt 183 lb 9.6 oz (83.3 kg)   SpO2 94%   BMI 27.92 kg/m   Opioid Risk Score:   Fall Risk Score:   `1  Depression screen PHQ 2/9  Depression screen [facility] 2/9 12/30/2019 12/02/2019 11/07/2019 10/22/2018 08/23/2018 04/05/2018 02/04/2018  Decreased Interest 0 0 0 0 0 1 0  Down, Depressed, Hopeless 0 0 0 1 1 1  0  PHQ - 2 Score 0 0 0 1 1 2  0  Altered sleeping - - 0 - - - -  Tired, decreased energy - - 0 - - - -  Change in appetite - - 0 - - - -  Feeling bad or failure about yourself  - - 0 - - - -  Trouble concentrating - - 0 - - - -  Moving slowly or fidgety/restless - - 0 - - - -  Suicidal thoughts - - 0 - - - -  PHQ-9 Score - - 0 - - - -    Review of Systems     Objective:   Physical Exam Vitals and nursing note reviewed.  Constitutional:      Appearance: Normal appearance.  Neck:     Comments: Cervical Paraspinal Tenderness: C-5-C-6 Cardiovascular:     Rate and Rhythm: Normal rate and regular rhythm.     Pulses: Normal pulses.     Heart sounds: Normal heart sounds.  Pulmonary:     Effort: Pulmonary effort is normal.     Breath sounds: Normal breath sounds.  Musculoskeletal:     Cervical back: Normal range of motion and neck supple.     Comments: Normal Muscle Bulk and Muscle Testing Reveals:  Upper Extremities: Full ROM and Muscle Strength 5/5 Bilateral AC Joint Tenderness Lumbar Hypersensitivity: L-3-L-5 Lower Extremities: Full ROM and Muscle Strength 5/5 Arises from Table with ease Narrow Based  Gait   Skin:    General: Skin is warm and dry.  Neurological:     Mental Status: He is alert.           Assessment & Plan:  1.Cervical Postlaminectomy: Continue HEP and Continue current medication regime.12/30/2019. 2. Cervicalgia/ Cervical Radiculitis: Continue current medication regime. Continue to Monitor.12/30/2019 3. Chronic BilateralShoulder Pain:Continue HEP as Tolerated.12/30/2019 4. L2 compression fracture/ Chronic Bilateral Low Back Pain without Sciatica: Continue to Monitor:12/30/2019 Refilled:Oxycodone 5 mg one tablet every 6hours as needed for  moderate pain. #120. We will Continue the opioid monitoring program. This consists of regular clinic visits, examinations, urine drug screen, pill counts as well as use of [place] controlled substance reporting System. 5. Polyarthralgia:No complaints today.Continue to Monitor.08/13//2021 6. Chronic Bilateral Thoracic Pain:Continue HEP as Tolerated. Continue Current medication regimen. Continue to Monitor.12/30/2019 . 56minutes of face to face patient care time was spent during this visit. All questions were encouraged and answered.  F/U in 1 month

## 2020-01-02 ENCOUNTER — Encounter: Payer: Self-pay | Admitting: Registered Nurse

## 2020-02-02 ENCOUNTER — Encounter: Payer: Self-pay | Admitting: Registered Nurse

## 2020-02-02 ENCOUNTER — Encounter: Payer: Medicare Other | Attending: Registered Nurse | Admitting: Registered Nurse

## 2020-02-02 ENCOUNTER — Other Ambulatory Visit: Payer: Self-pay

## 2020-02-02 VITALS — BP 134/84 | HR 60 | Temp 98.0°F | Ht 68.0 in | Wt 181.2 lb

## 2020-02-02 DIAGNOSIS — G894 Chronic pain syndrome: Secondary | ICD-10-CM | POA: Diagnosis present

## 2020-02-02 DIAGNOSIS — Z823 Family history of stroke: Secondary | ICD-10-CM | POA: Insufficient documentation

## 2020-02-02 DIAGNOSIS — H9190 Unspecified hearing loss, unspecified ear: Secondary | ICD-10-CM | POA: Insufficient documentation

## 2020-02-02 DIAGNOSIS — M25511 Pain in right shoulder: Secondary | ICD-10-CM | POA: Insufficient documentation

## 2020-02-02 DIAGNOSIS — Z981 Arthrodesis status: Secondary | ICD-10-CM | POA: Diagnosis not present

## 2020-02-02 DIAGNOSIS — M4802 Spinal stenosis, cervical region: Secondary | ICD-10-CM

## 2020-02-02 DIAGNOSIS — G8929 Other chronic pain: Secondary | ICD-10-CM

## 2020-02-02 DIAGNOSIS — M542 Cervicalgia: Secondary | ICD-10-CM

## 2020-02-02 DIAGNOSIS — M5412 Radiculopathy, cervical region: Secondary | ICD-10-CM | POA: Diagnosis not present

## 2020-02-02 DIAGNOSIS — Z8249 Family history of ischemic heart disease and other diseases of the circulatory system: Secondary | ICD-10-CM | POA: Insufficient documentation

## 2020-02-02 DIAGNOSIS — Z87891 Personal history of nicotine dependence: Secondary | ICD-10-CM | POA: Insufficient documentation

## 2020-02-02 DIAGNOSIS — I10 Essential (primary) hypertension: Secondary | ICD-10-CM | POA: Diagnosis not present

## 2020-02-02 DIAGNOSIS — Z5181 Encounter for therapeutic drug level monitoring: Secondary | ICD-10-CM

## 2020-02-02 DIAGNOSIS — Z79891 Long term (current) use of opiate analgesic: Secondary | ICD-10-CM | POA: Diagnosis present

## 2020-02-02 DIAGNOSIS — M961 Postlaminectomy syndrome, not elsewhere classified: Secondary | ICD-10-CM

## 2020-02-02 DIAGNOSIS — E785 Hyperlipidemia, unspecified: Secondary | ICD-10-CM | POA: Diagnosis not present

## 2020-02-02 DIAGNOSIS — M25512 Pain in left shoulder: Secondary | ICD-10-CM | POA: Diagnosis not present

## 2020-02-02 DIAGNOSIS — M545 Low back pain: Secondary | ICD-10-CM

## 2020-02-02 DIAGNOSIS — Z841 Family history of disorders of kidney and ureter: Secondary | ICD-10-CM | POA: Diagnosis not present

## 2020-02-02 DIAGNOSIS — Z87442 Personal history of urinary calculi: Secondary | ICD-10-CM | POA: Diagnosis not present

## 2020-02-02 MED ORDER — OXYCODONE HCL 5 MG PO TABS: 5.0000 mg | ORAL_TABLET | Freq: Four times a day (QID) | ORAL | 0 refills | Status: DC | PRN

## 2020-02-02 NOTE — Progress Notes (Signed)
Subjective:    Patient ID: Fred [name], male    DOB: 08/01/1950, 69 y.o.   MRN: [id number]  HPI: Fred [name] is a 69 y.o. male who returns for follow up appointment for chronic pain and medication refill. He states his pain is located in his neck radiating into his bilateral shoulders and lower back. He rates his pain 7. His current exercise regime is walking and performing stretching exercises.  Mr. Route Morphine equivalent is 30.00  MME.    Last Oral Swab was Performed on 11/07/2019, it was consistent.    Pain Inventory Average Pain 5 Pain Right Now 7 My pain is constant, sharp and aching  In the last 24 hours, has pain interfered with the following? General activity 4 Relation with others 6 Enjoyment of life 5 What TIME of day is your pain at its worst? daytime and evening Sleep (in general) Fair  Pain is worse with: walking, bending, standing and some activites Pain improves with: rest and medication Relief from Meds: 4  Family History  Problem Relation Age of Onset  . Stroke Mother   . Dementia Mother   . Heart disease Father   . Stroke Father        heat stroke  . Kidney disease Brother   . Dementia Brother    Social History   Socioeconomic History  . Marital status: Married    Spouse name: Not on file  . Number of children: 6  . Years of education: 12+  . Highest education level: Not on file  Occupational History  . Occupation: Retired  Tobacco Use  . Smoking status: Former Smoker    Packs/day: 1.00    Years: 30.00    Pack years: 30.00    Types: Cigarettes    Quit date: 05/25/2001    Years since quitting: 18.7  . Smokeless tobacco: Former Systems developer    Types: Chew, Snuff    Quit date: 09/22/2001  Vaping Use  . Vaping Use: Never used  Substance and Sexual Activity  . Alcohol use: Not Currently  . Drug use: No  . Sexual activity: Not on file  Other Topics Concern  . Not on file  Social History Narrative   Lives with wife   Caffeine use:  Coffee daily (1-2 per day)   Some soda   Right handed    Social Determinants of Health   Financial Resource Strain:   . Difficulty of Paying Living Expenses: Not on file  Food Insecurity:   . Worried About Charity fundraiser in the Last Year: Not on file  . Ran Out of Food in the Last Year: Not on file  Transportation Needs:   . Lack of Transportation (Medical): Not on file  . Lack of Transportation (Non-Medical): Not on file  Physical Activity:   . Days of Exercise per Week: Not on file  . Minutes of Exercise per Session: Not on file  Stress:   . Feeling of Stress : Not on file  Social Connections:   . Frequency of Communication with Friends and Family: Not on file  . Frequency of Social Gatherings with Friends and Family: Not on file  . Attends Religious Services: Not on file  . Active Member of Clubs or Organizations: Not on file  . Attends Archivist Meetings: Not on file  . Marital Status: Not on file   Past Surgical History:  Procedure Laterality Date  . ANTERIOR CERVICAL DECOMP/DISCECTOMY FUSION  09-19-2010  dr Ronnald Ramp  @[facility]   . COLONOSCOPY W/ POLYPECTOMY  last one 03/ 2019  . CYSTOSCOPY N/A 05/17/2018   Procedure: CYSTOSCOPY/ REMOVAL OF BLADDER CALCULUS;  Surgeon: [name], MD;  Location: [facility];  Service: Urology;  Laterality: N/A;  ONLY NEEDS 45 MIN  . FINGER SURGERY  1970s   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 RIGHT MIDDLE FINGER  . IMPLANTATION BONE ANCHORED HEARING AID Right 2011    @[facility]    "no longer have this"  . LYMPHADENECTOMY Bilateral 08/04/2013   Procedure: LYMPHADENECTOMY;  Surgeon: Dutch Gray, MD;  [facility]: [facility];  Service: Urology;  Laterality: Bilateral;  . PROSTATE SURGERY    . ROBOT ASSISTED LAPAROSCOPIC RADICAL PROSTATECTOMY N/A 08/04/2013   Procedure: ROBOTIC ASSISTED LAPAROSCOPIC RADICAL PROSTATECTOMY LEVEL 2;  Surgeon: Dutch Gray, MD;  [facility];  Service: Urology;  Laterality: N/A;   Past  Surgical History:  Procedure Laterality Date  . ANTERIOR CERVICAL DECOMP/DISCECTOMY FUSION  09-19-2010    dr Ronnald Ramp  @[facility]   . COLONOSCOPY W/ POLYPECTOMY  last one 03/ 2019  . CYSTOSCOPY N/A 05/17/2018   Procedure: CYSTOSCOPY/ REMOVAL OF BLADDER CALCULUS;  Surgeon: [name], MD;  [facility];  Service: Urology;  Laterality: N/A;  ONLY NEEDS 45 MIN  . FINGER SURGERY  1970s   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 RIGHT MIDDLE FINGER  . IMPLANTATION BONE ANCHORED HEARING AID Right 2011    @[facility]    "no longer have this"  . LYMPHADENECTOMY Bilateral 08/04/2013   Procedure: LYMPHADENECTOMY;  Surgeon: Dutch Gray, MD;  [facility];  Service: Urology;  Laterality: Bilateral;  . PROSTATE SURGERY    . ROBOT ASSISTED LAPAROSCOPIC RADICAL PROSTATECTOMY N/A 08/04/2013   Procedure: ROBOTIC ASSISTED LAPAROSCOPIC RADICAL PROSTATECTOMY LEVEL 2;  Surgeon: Dutch Gray, MD;  [facility];  Service: Urology;  Laterality: N/A;   Past Medical History:  Diagnosis Date  . Anticoagulated    xarelto  . Benign essential tremor 10/12/2019  . Depression   . Gait abnormality    MILD --- DOES NOT USE CANE  . 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 History of kidney stones   . 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 Hyperlipidemia   . Hypertension   . 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 Nocturia    twice  . Prostate cancer ([place])    UROLOGIST-- DR [name]---  Stage T2a, Gleason 4+3, PSA 5.4,  s/p  radical prostatectomy 2015;   05-14-2018  per pt PSA undetectable  . Pulmonary embolism, bilateral ([place]) 12/02/2017   currently taking xarelto  . Wears dentures    upper  . Wears glasses   . Wears hearing aid in both ears    BP 134/84   Pulse (!) 57   Temp 98 F (36.7 C)    Ht 5\' 8"  (1.727 m)   Wt 181 lb 3.2 oz (82.2 kg)   SpO2 96%   BMI 27.55 kg/m   Opioid Risk Score:   Fall Risk Score:  `1  Depression screen PHQ 2/9  Depression screen [facility] 2/9 02/02/2020 12/30/2019 12/02/2019 11/07/2019 10/22/2018 08/23/2018 04/05/2018  Decreased Interest 0 0 0 0 0 0 1  Down, Depressed, Hopeless 0 0 0 0  1 1 1   PHQ - 2 Score 0 0 0 0 1 1 2   Altered sleeping - - - 0 - - -  Tired, decreased energy - - - 0 - - -  Change in appetite - - - 0 - - -  Feeling bad or failure about yourself  - - - 0 - - -  Trouble concentrating - - - 0 - - -  Moving slowly or fidgety/restless - - - 0 - - -  Suicidal thoughts - - - 0 - - -  PHQ-9 Score - - - 0 - - -   Review of Systems  Constitutional: Negative.   HENT: Negative.   Eyes: Negative.   Respiratory: Negative.   Cardiovascular: Negative.   Gastrointestinal: Negative.   Endocrine: Negative.   Genitourinary: Negative.   Musculoskeletal: Positive for back pain.  Skin: Negative.   Allergic/Immunologic: Negative.   Neurological: Negative.   Hematological: Negative.   Psychiatric/Behavioral: Negative.   All other systems reviewed and are negative.      Objective:   Physical Exam Vitals and nursing note reviewed.  Constitutional:      Appearance: Normal appearance.  Neck:     Comments: Cervical Paraspinal Tenderness: C-5-C-6 Cardiovascular:     Rate and Rhythm: Normal rate and regular rhythm.     Pulses: Normal pulses.     Heart sounds: Normal heart sounds.  Pulmonary:     Effort: Pulmonary effort is normal.     Breath sounds: Normal breath sounds.  Musculoskeletal:     Cervical back: Normal range of motion and neck supple.     Comments: Normal Muscle Bulk and Muscle Testing Reveals:  Upper Extremities: Full ROM and Muscle Strength 5/5  Lumbar Paraspinal Tenderness: L-4-L-5 Lower Extremities: Full ROM and Muscle Strength 5/5 Arises from chair with ease Narrow Based Gait   Skin:    General: Skin is warm and dry.    Neurological:     Mental Status: He is alert and oriented to person, place, and time.  Psychiatric:        Mood and Affect: Mood normal.        Behavior: Behavior normal.           Assessment & Plan:  1.Cervical Postlaminectomy: Continue HEP and Continue current medication regime.02/02/2020. 2. Cervicalgia/ Cervical Radiculitis: Continue current medication regime. Continue to Monitor.02/02/2020 3. Chronic BilateralShoulder Pain:Continue HEP as Tolerated.02/02/2020 4. L2 compression fracture/ Chronic Bilateral Low Back Pain without Sciatica: Continue to Monitor:02/02/2020 Refilled:Oxycodone 5 mg one tablet every 6hours as needed for moderate pain. #120. We will continue the opioid monitoring program, this consists of regular clinic visits, examinations, urine drug screen, pill counts as well as use of [place] Controlled Substance Reporting system. A 12 month History has been reviewed on the [place] on 02/02/2020.  5. Polyarthralgia:No complaints today.Continue to Monitor.09/16//2021 6. Chronic Bilateral Thoracic Pain:No complaints Today. Continue HEP as Tolerated. Continue Current medication regimen. Continue to Monitor.02/02/2020 . 30minutes of face to face patient care time was spent during this visit. All questions were encouraged and answered.  F/U in 1 month

## 2020-03-04 IMAGING — CT CT ANGIO CHEST
2 of 6 series · 18 of 46 positions shown · IV contrast (ISOVUE)
Comparison: None.

CLINICAL DATA: 67-year-old male with a history of right-sided chest
pain starting 8 hours ago.

EXAM:
CT ANGIOGRAPHY CHEST WITH CONTRAST
TECHNIQUE: Multidetector CT imaging of the chest was performed using the
standard protocol during bolus administration of intravenous
contrast. Multiplanar CT image reconstructions and MIPs were
obtained to evaluate the vascular anatomy.
CONTRAST:  100mL M4QMH8-MO2 IOPAMIDOL (M4QMH8-MO2) INJECTION 76%

[Series 5: thins · axial · 0.76mm/px · z∈[-469,-201]mm · 15 of 294 slices shown]
[im 13/294  lung]
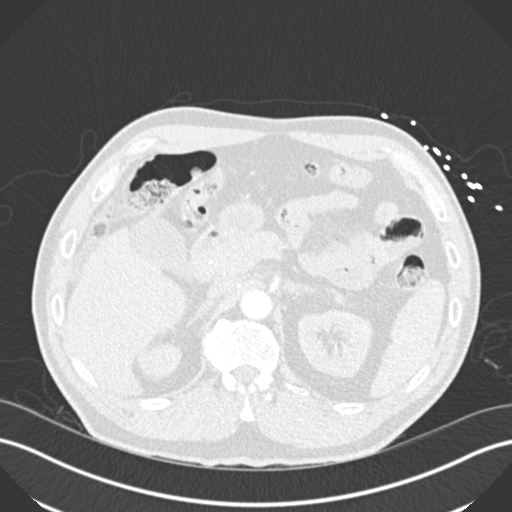
[im 39/294  soft-tissue]
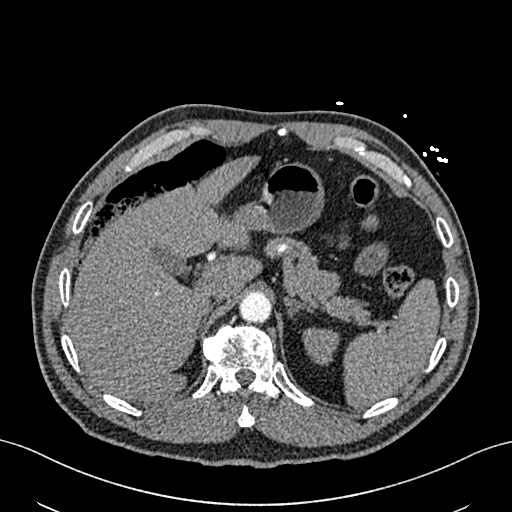
[im 51/294  lung]
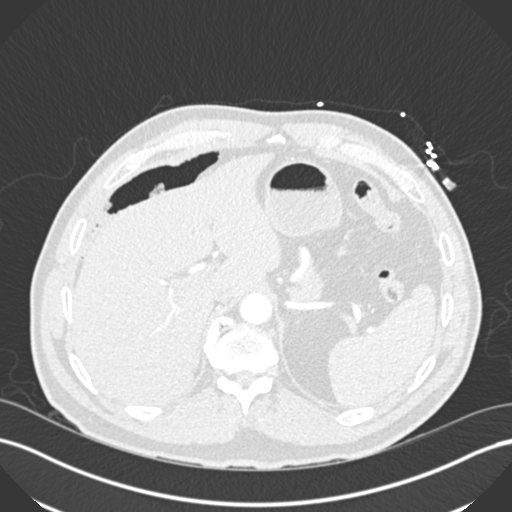
[im 77/294  soft-tissue]
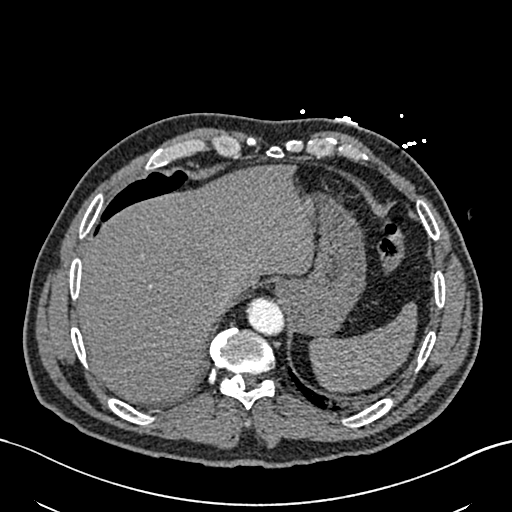
[im 90/294  lung]
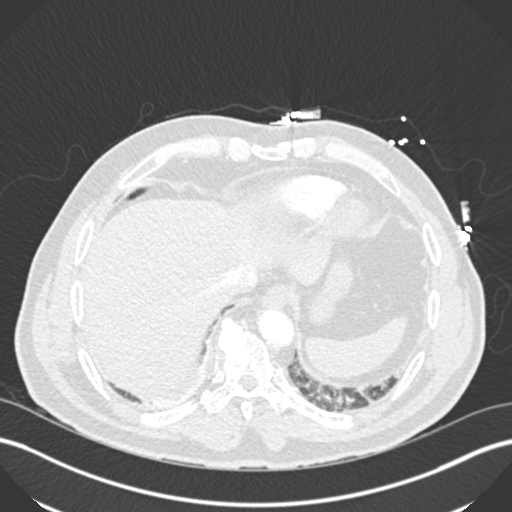
[im 115/294  soft-tissue]
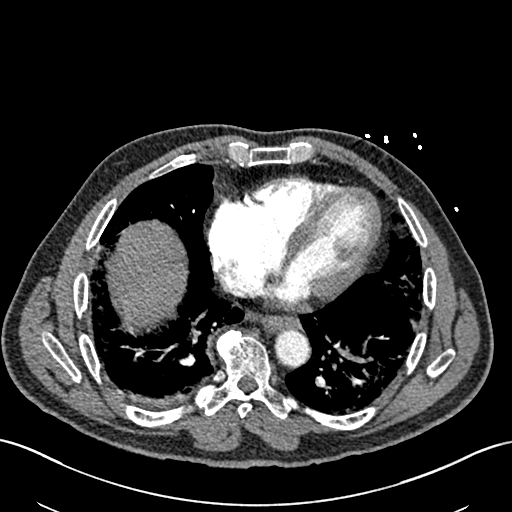
[im 128/294  lung]
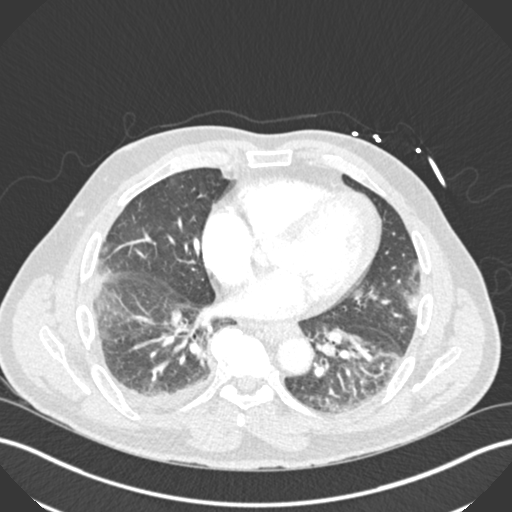
[im 153/294  soft-tissue]
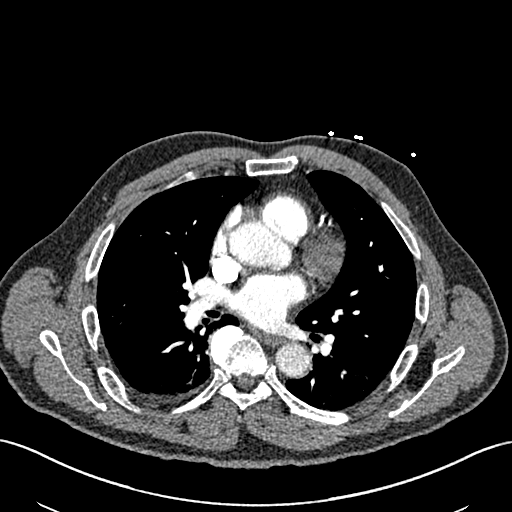
[im 166/294  lung]
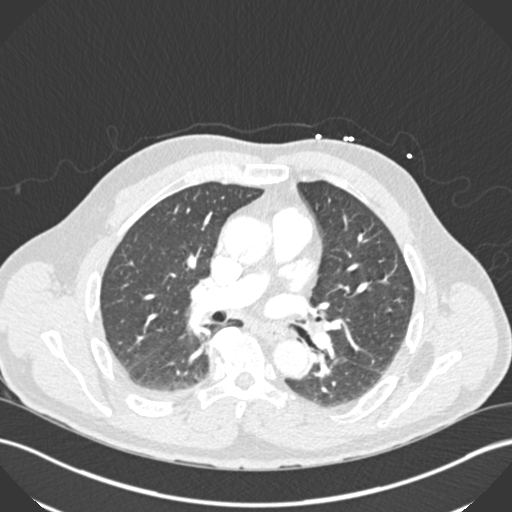
[im 179/294  soft-tissue]
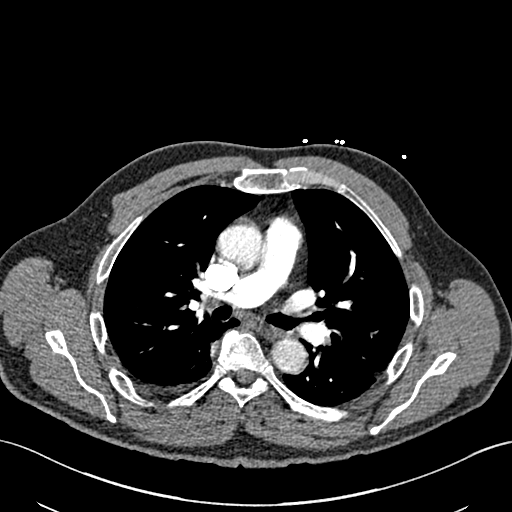
[im 204/294  lung]
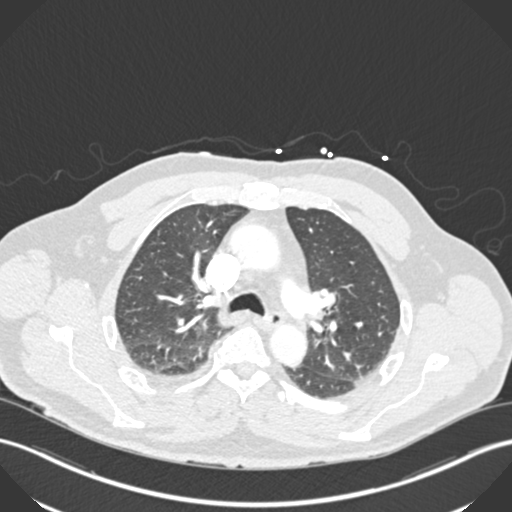
[im 217/294  soft-tissue]
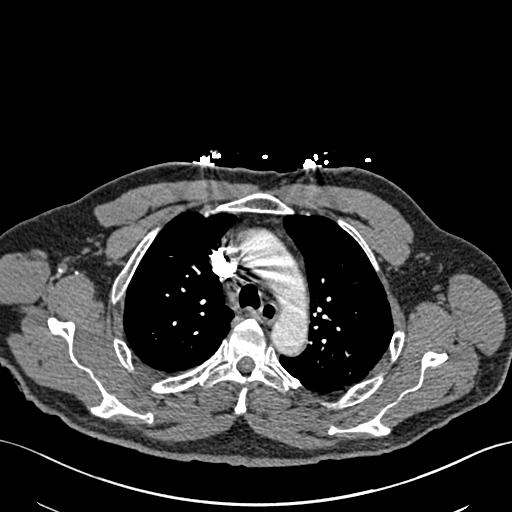
[im 243/294  lung]
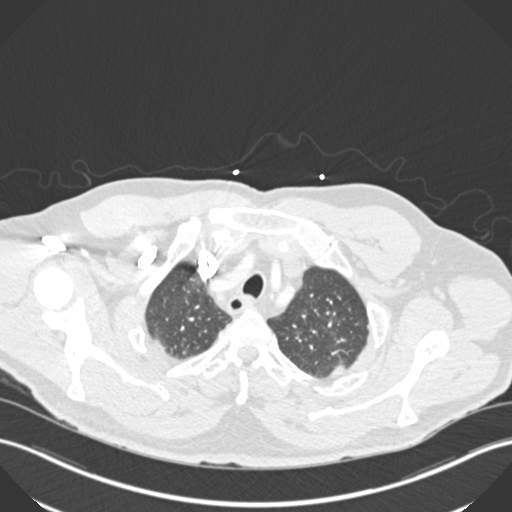
[im 255/294  soft-tissue]
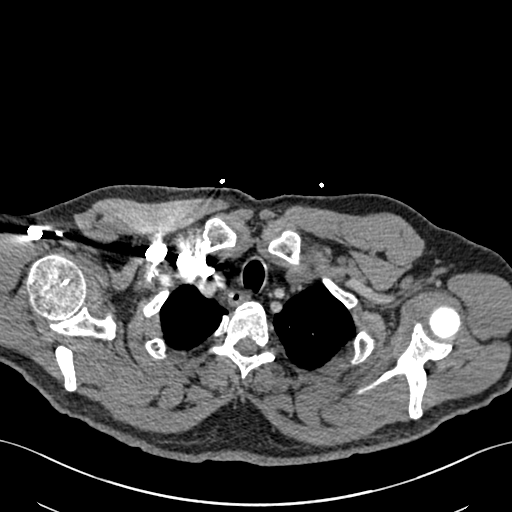
[im 281/294  lung]
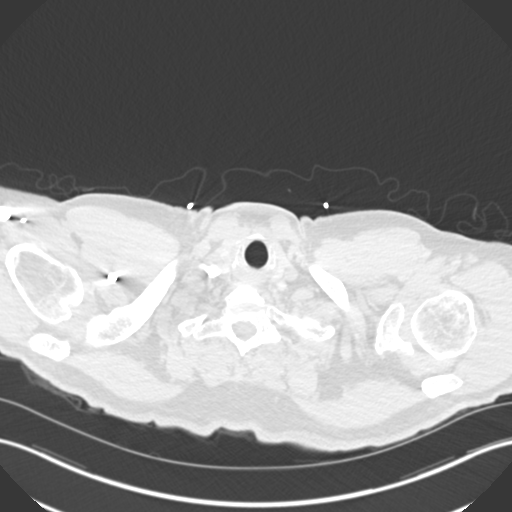

[Series 7: coronal mpr · coronal · 0.61mm/px · 3 of 141 slices shown]
[im 36/141  soft-tissue]
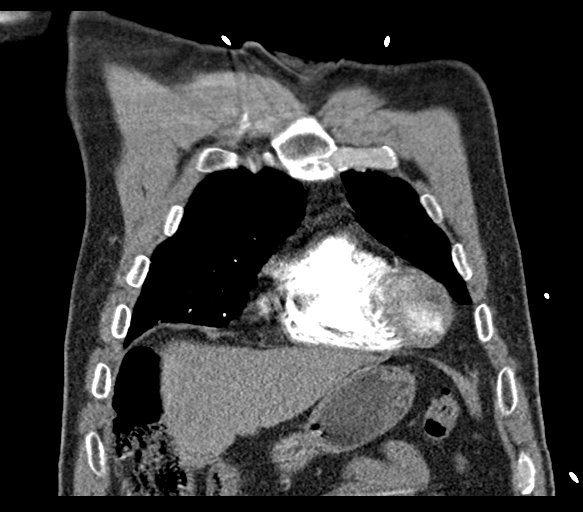
[im 71/141  soft-tissue]
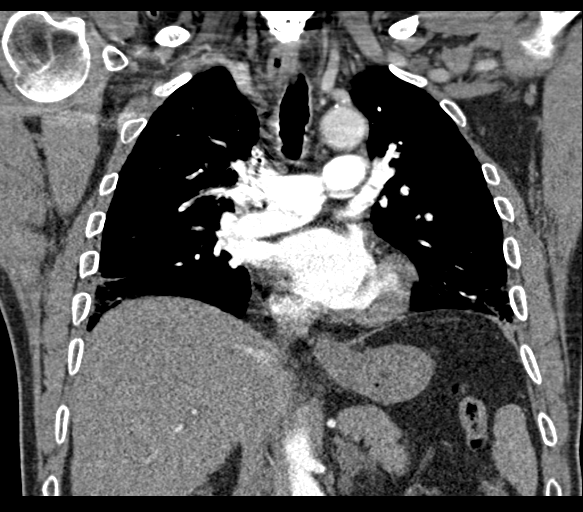
[im 106/141  soft-tissue]
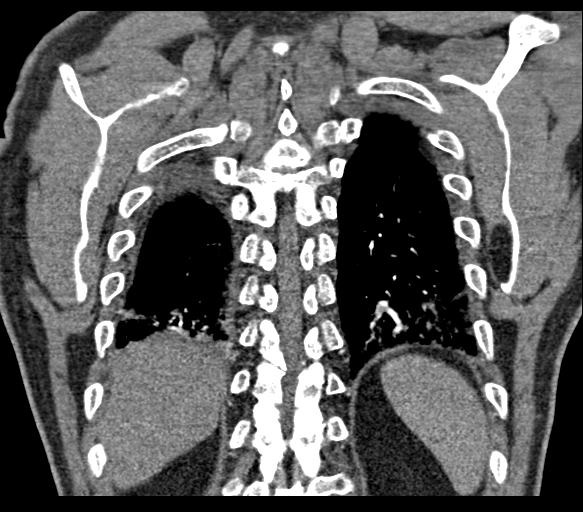

[18 of 46 positions shown; findings below may reference images not displayed]

FINDINGS: Cardiovascular:

Heart:

No cardiomegaly. No pericardial fluid/thickening. Calcifications of
the left anterior descending, circumflex coronary arteries. Ratio of
the right ventricle to left ventricle remains below 1. No septal
inversion.

Aorta:

Unremarkable course, caliber, contour of the thoracic aorta. No
aneurysm or dissection flap. No periaortic fluid.

Pulmonary arteries:

No filling defects of the main pulmonary artery, lobar arteries, or
segmental arteries. There are a few emboli identified within the
subsegmental pulmonary arteries of the right lower lobe, left upper
lobe, and left lower lobe.

Mediastinum/Nodes: No mediastinal adenopathy. Unremarkable
appearance of the thoracic inlet.

Unremarkable course of the thoracic esophagus.

Lungs/Pleura: Respiratory motion somewhat limits evaluation of the
lung bases. Interlobular septal thickening with nodular opacities at
the bilateral lung bases with mixed ground-glass opacities. Trace
right-sided pleural effusion. Calcified granuloma on the right
within the major fissure (image 75 of series 6). Subpleural lymph
node just distal to the granuloma.

Upper Abdomen: No acute finding of the upper abdomen. Nonobstructive
left-sided nephrolithiasis

Musculoskeletal: No acute displaced fracture. Degenerative changes
of the spine.

Review of the MIP images confirms the above findings.
IMPRESSION: CT is positive for pulmonary emboli, with low volume emboli
involving subsegmental branches of the left upper lobe and bilateral
lower lobes.

Mixed ground-glass and nodular opacities the bilateral lobes with
small right-sided pleural effusion may be reactive given the
pulmonary emboli, or alternatively represent developing infection.

These results were called by telephone at the time of interpretation
on 12/02/2017 at [DATE] to Ms. PUI KUEN LARANO , who verbally
acknowledged these results.

Two vessel coronary artery disease.

## 2020-03-05 ENCOUNTER — Encounter: Payer: Self-pay | Admitting: Registered Nurse

## 2020-03-05 ENCOUNTER — Encounter: Payer: Medicare Other | Attending: Registered Nurse | Admitting: Registered Nurse

## 2020-03-05 ENCOUNTER — Other Ambulatory Visit: Payer: Self-pay

## 2020-03-05 VITALS — BP 146/91 | HR 60 | Temp 97.8°F | Ht 68.0 in | Wt 184.0 lb

## 2020-03-05 DIAGNOSIS — M5412 Radiculopathy, cervical region: Secondary | ICD-10-CM | POA: Insufficient documentation

## 2020-03-05 DIAGNOSIS — M542 Cervicalgia: Secondary | ICD-10-CM | POA: Diagnosis not present

## 2020-03-05 DIAGNOSIS — M545 Low back pain, unspecified: Secondary | ICD-10-CM | POA: Diagnosis present

## 2020-03-05 DIAGNOSIS — Z5181 Encounter for therapeutic drug level monitoring: Secondary | ICD-10-CM | POA: Diagnosis present

## 2020-03-05 DIAGNOSIS — G8929 Other chronic pain: Secondary | ICD-10-CM | POA: Insufficient documentation

## 2020-03-05 DIAGNOSIS — Z79891 Long term (current) use of opiate analgesic: Secondary | ICD-10-CM | POA: Diagnosis present

## 2020-03-05 DIAGNOSIS — G894 Chronic pain syndrome: Secondary | ICD-10-CM | POA: Insufficient documentation

## 2020-03-05 DIAGNOSIS — M961 Postlaminectomy syndrome, not elsewhere classified: Secondary | ICD-10-CM | POA: Diagnosis present

## 2020-03-05 DIAGNOSIS — M4802 Spinal stenosis, cervical region: Secondary | ICD-10-CM | POA: Diagnosis not present

## 2020-03-05 IMAGING — DX DG CHEST 1V PORT
1 series · 1 of 1 positions shown · non-contrast
Comparison: 12/02/2017 CT chest and chest radiograph.

CLINICAL DATA: 67 y/o  M; chest pain.

EXAM:
PORTABLE CHEST 1 VIEW

[chest ap]
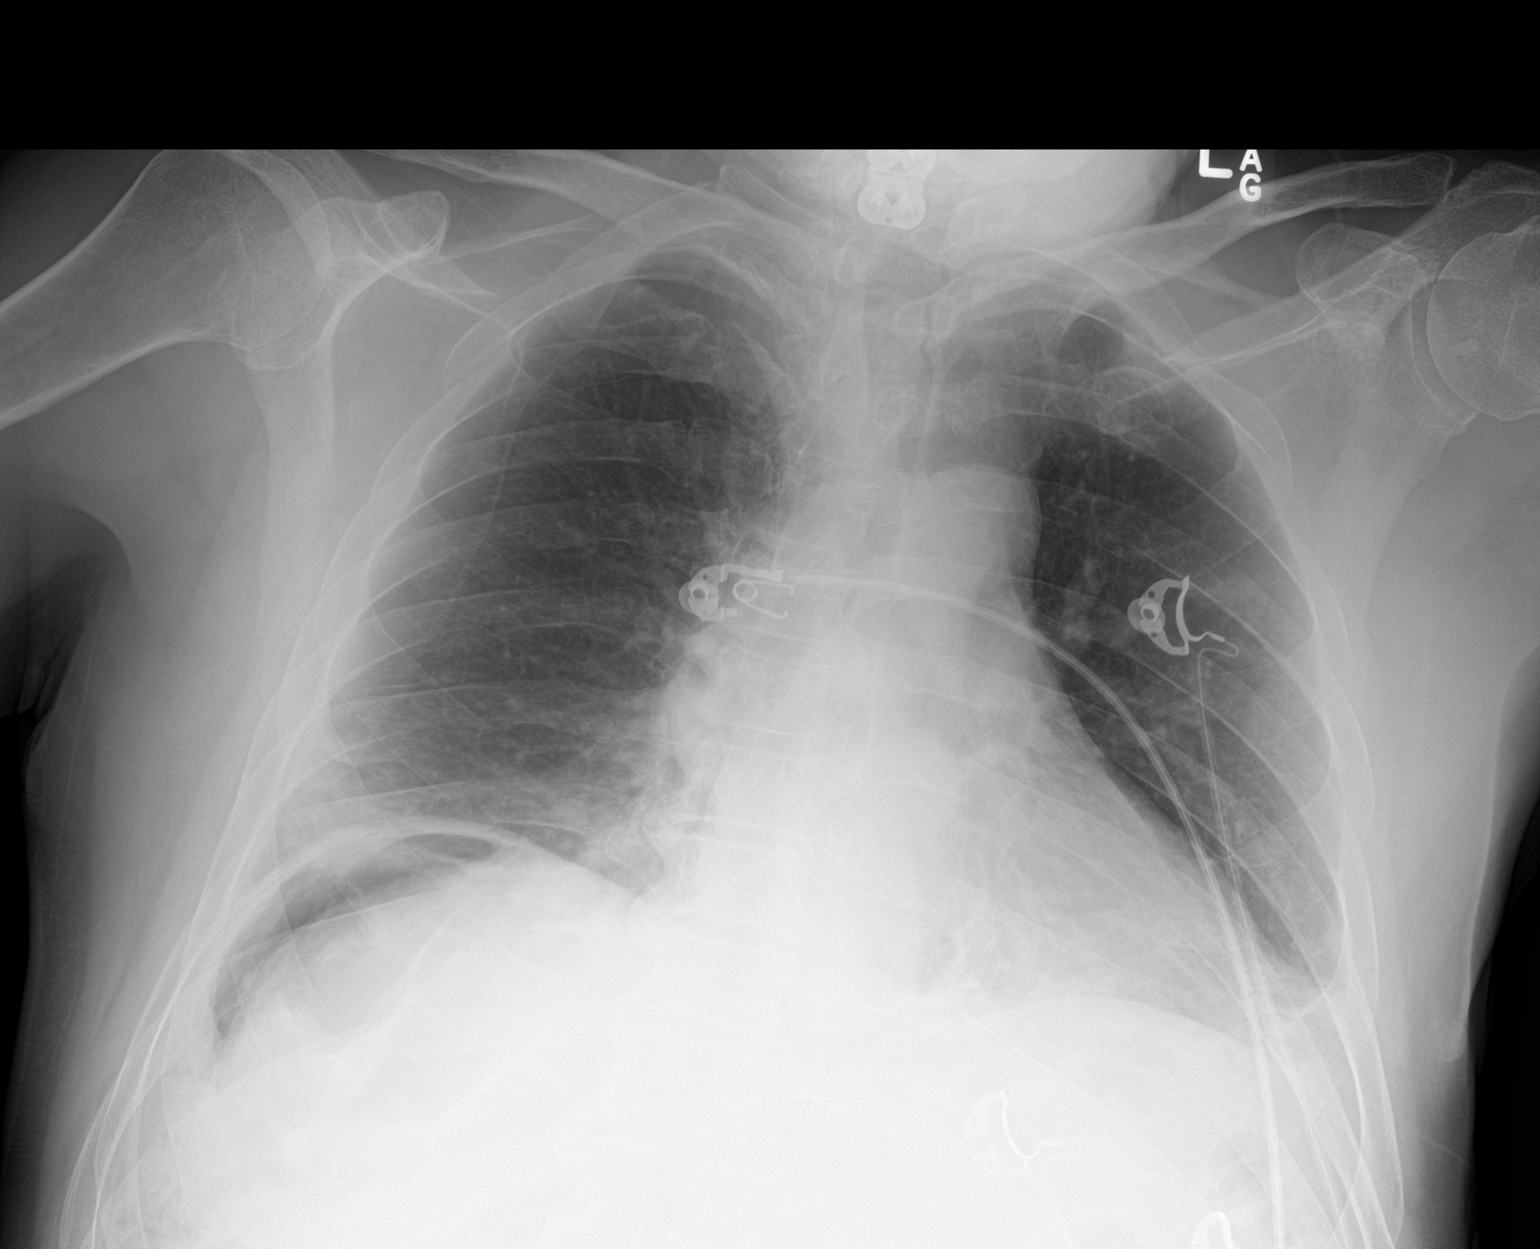

[1 of 1 positions shown; findings below may reference images not displayed]

FINDINGS: Stable normal cardiac silhouette. Anterior cervical fusion hardware
noted. Streaky opacities at the lung bases. No pneumothorax. Bowel
super position over the liver. No acute osseous abnormality is
evident.
IMPRESSION: Streaky opacities at the lung bases may represent atelectasis or
pneumonia.

By: Derkham Timinis M.D.

## 2020-03-05 MED ORDER — OXYCODONE HCL 5 MG PO TABS: 5.0000 mg | ORAL_TABLET | Freq: Four times a day (QID) | ORAL | 0 refills | Status: DC | PRN

## 2020-03-05 NOTE — Progress Notes (Signed)
Subjective:    Patient ID: Fred [name], male    DOB: 10-Apr-1951, 69 y.o.   MRN: [id number]  HPI: Fred [name] is a 69 y.o. male who returns for follow up appointment for chronic pain and medication refill. He states his pain is located in his neck radiating into his bilateral shoulders and lower back pain. He rates his pain 6. His  current exercise regime is walking and performing stretching exercises.  Fred [name] Morphine equivalent is 30.00  MME.    Last Oral Swab was Performed on 11/07/2019, it was consistent.    Pain Inventory Average Pain 7 Pain Right Now 6 My pain is sharp, stabbing and aching  In the last 24 hours, has pain interfered with the following? General activity 5 Relation with others 4 Enjoyment of life 5 What TIME of day is your pain at its worst? daytime and evening Sleep (in general) Fair  Pain is worse with: walking, bending and standing Pain improves with: medication Relief from Meds: 4  Family History  Problem Relation Age of Onset   Stroke Mother    Dementia Mother    Heart disease Father    Stroke Father        heat stroke   Kidney disease Brother    Dementia Brother    Social History   Socioeconomic History   Marital status: Married    Spouse name: Not on file   Number of children: 6   Years of education: 12+   Highest education level: Not on file  Occupational History   Occupation: Retired  Tobacco Use   Smoking status: Former Smoker    Packs/day: 1.00    Years: 30.00    Pack years: 30.00    Types: Cigarettes    Quit date: 05/25/2001    Years since quitting: 18.7   Smokeless tobacco: Former Systems developer    Types: Chew, Snuff    Quit date: 09/22/2001  Vaping Use   Vaping Use: Never used  Substance and Sexual Activity   Alcohol use: Not Currently   Drug use: No   Sexual activity: Not on file  Other Topics Concern   Not on file  Social History Narrative   Lives with wife   Caffeine use: Coffee daily (1-2  per day)   Some soda   Right handed    Social Determinants of Health   Financial Resource Strain:    Difficulty of Paying Living Expenses: Not on file  Food Insecurity:    Worried About [place] in the Last Year: Not on file   [employer] of Food in the Last Year: Not on file  Transportation Needs:    Lack of Transportation (Medical): Not on file   Lack of Transportation (Non-Medical): Not on file  Physical Activity:    Days of Exercise per Week: Not on file   Minutes of Exercise per Session: Not on file  Stress:    Feeling of Stress : Not on file  Social Connections:    Frequency of Communication with Friends and Family: Not on file   Frequency of Social Gatherings with Friends and Family: Not on file   Attends Religious Services: Not on file   Active Member of Clubs or Organizations: Not on file   Attends Archivist Meetings: Not on file   Marital Status: Not on file   Past Surgical History:  Procedure Laterality Date   ANTERIOR CERVICAL DECOMP/DISCECTOMY FUSION  09-19-2010  dr Ronnald Ramp  @[facility]    COLONOSCOPY W/ POLYPECTOMY  last one 03/ 2019   CYSTOSCOPY N/A 05/17/2018   Procedure: CYSTOSCOPY/ REMOVAL OF BLADDER CALCULUS;  Surgeon: [name], MD;  [facility];  Service: Urology;  Laterality: N/A;  ONLY NEEDS 45 MIN   FINGER SURGERY  1970s   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 RIGHT MIDDLE FINGER   IMPLANTATION BONE ANCHORED HEARING AID Right 2011    @[facility]    "no longer have this"   LYMPHADENECTOMY Bilateral 08/04/2013   Procedure: LYMPHADENECTOMY;  Surgeon: Dutch Gray, MD;  Location: [facility];  Service: Urology;  Laterality: Bilateral;   PROSTATE SURGERY     ROBOT ASSISTED LAPAROSCOPIC RADICAL PROSTATECTOMY N/A 08/04/2013   Procedure: ROBOTIC ASSISTED LAPAROSCOPIC RADICAL PROSTATECTOMY LEVEL 2;  Surgeon: Dutch Gray, MD;  [facility];  Service: Urology;  Laterality: N/A;   Past Surgical History:    Procedure Laterality Date   ANTERIOR CERVICAL DECOMP/DISCECTOMY FUSION  09-19-2010    dr Ronnald Ramp  @[facility]    COLONOSCOPY W/ POLYPECTOMY  last one 03/ 2019   CYSTOSCOPY N/A 05/17/2018   Procedure: CYSTOSCOPY/ REMOVAL OF BLADDER CALCULUS;  Surgeon: [name], MD;  [facility];  Service: Urology;  Laterality: N/A;  ONLY NEEDS 45 MIN   FINGER SURGERY  1970s   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 RIGHT MIDDLE FINGER   IMPLANTATION BONE ANCHORED HEARING AID Right 2011    @[facility]    "no longer have this"   LYMPHADENECTOMY Bilateral 08/04/2013   Procedure: LYMPHADENECTOMY;  Surgeon: Dutch Gray, MD;  [facility];  Service: Urology;  Laterality: Bilateral;   PROSTATE SURGERY     ROBOT ASSISTED LAPAROSCOPIC RADICAL PROSTATECTOMY N/A 08/04/2013   Procedure: ROBOTIC ASSISTED LAPAROSCOPIC RADICAL PROSTATECTOMY LEVEL 2;  Surgeon: Dutch Gray, MD;  [facility];  Service: Urology;  Laterality: N/A;   Past Medical History:  Diagnosis Date   Anticoagulated    xarelto   Benign essential tremor 10/12/2019   Depression    Gait abnormality    MILD --- DOES NOT USE CANE   Hearing loss    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   History of kidney stones    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 ,OCCIPITAL SKULL FRACTURE-- RESIDUAL MILD MEMORY ISSUES AND RIGHT SIDE HEARING LOSS (PT FELL ON HEAD FROM 6 FT LADDER)   Hyperlipidemia    Hypertension    Mild memory disturbance    DUE TO TBI , 10-2008   (per pt mild)   Nocturia    twice   Prostate cancer ([place])    UROLOGIST-- DR [name]---  Stage T2a, Gleason 4+3, PSA 5.4,  s/p  radical prostatectomy 2015;   05-14-2018  per pt PSA undetectable   Pulmonary embolism, bilateral ([place]) 12/02/2017   currently taking xarelto   Wears dentures    upper   Wears glasses    Wears hearing aid in both ears    BP (!) 164/99    Pulse (!) 59    Temp 97.8 F (36.6 C)    Ht 5\' 8"   (1.727 m)    Wt 184 lb (83.5 kg)    SpO2 96%    BMI 27.98 kg/m   Opioid Risk Score:   Fall Risk Score:  `1  Depression screen PHQ 2/9  Depression screen [facility] 2/9 02/02/2020 12/30/2019 12/02/2019 11/07/2019 10/22/2018 08/23/2018 04/05/2018  Decreased Interest 0 0 0 0 0 0 1  Down,  Depressed, Hopeless 0 0 0 0 1 1 1   PHQ - 2 Score 0 0 0 0 1 1 2   Altered sleeping - - - 0 - - -  Tired, decreased energy - - - 0 - - -  Change in appetite - - - 0 - - -  Feeling bad or failure about yourself  - - - 0 - - -  Trouble concentrating - - - 0 - - -  Moving slowly or fidgety/restless - - - 0 - - -  Suicidal thoughts - - - 0 - - -  PHQ-9 Score - - - 0 - - -    Review of Systems  Constitutional: Negative.   HENT: Negative.   Eyes: Negative.   Respiratory: Negative.   Cardiovascular: Negative.   Gastrointestinal: Negative.   Endocrine: Negative.   Genitourinary: Negative.   Musculoskeletal: Positive for back pain and neck pain.  Allergic/Immunologic: Negative.   Neurological: Negative.   Psychiatric/Behavioral: Negative.   All other systems reviewed and are negative.      Objective:   Physical Exam Vitals and nursing note reviewed.  Constitutional:      Appearance: Normal appearance.  Neck:     Comments: Cervical Paraspinal Tenderness: C-5-C-6 Cardiovascular:     Rate and Rhythm: Normal rate and regular rhythm.     Pulses: Normal pulses.     Heart sounds: Normal heart sounds.  Pulmonary:     Effort: Pulmonary effort is normal.     Breath sounds: Normal breath sounds.  Musculoskeletal:     Cervical back: Normal range of motion and neck supple.     Comments: Normal Muscle Bulk and Muscle Testing Reveals:  Upper Extremities: Full ROM and Muscle Strength 5/5 Thoracic Paraspinal Tenderness: T-7-T-9  Lumbar Paraspinal Tenderness: L-3-L-5 Lower Extremities: Full ROM and Muscle Strength 5/5 Arises from chair with ease Narrow Based  Gait   Skin:    General: Skin is warm and dry.    Neurological:     Mental Status: He is alert and oriented to person, place, and time.  Psychiatric:        Mood and Affect: Mood normal.        Behavior: Behavior normal.           Assessment & Plan:  1.Cervical Postlaminectomy: Continue HEP and Continue current medication regime.03/05/2020. 2. Cervicalgia/ Cervical Radiculitis: Continue current medication regime. Continue to Monitor.03/05/2020 3. Chronic BilateralShoulder Pain:Continue HEP as Tolerated.Continue to monitor.03/05/2020 4. L2 compression fracture/ Chronic Bilateral Low Back Pain without Sciatica: Continue to Monitor:03/05/2020 Refilled:Oxycodone 5 mg one tablet every 6hours as needed for moderate pain. #120. We will continue the opioid monitoring program, this consists of regular clinic visits, examinations, urine drug screen, pill counts as well as use of [place] Controlled Substance Reporting system. A 12 month History has been reviewed on the [place] on 03/05/2020.  5. Polyarthralgia:No complaints today.Continue to Monitor.10/18//2021 6. Chronic Bilateral Thoracic Pain:Continue HEP as Tolerated. Continue Current medication regimen. Continue to Monitor.03/05/2020 . 26minutes of face to face patient care time was spent during this visit. All questions were encouraged and answered.  F/U in 1 month

## 2020-04-05 ENCOUNTER — Encounter: Payer: Medicare Other | Attending: Registered Nurse | Admitting: Registered Nurse

## 2020-04-05 ENCOUNTER — Encounter: Payer: Self-pay | Admitting: Registered Nurse

## 2020-04-05 ENCOUNTER — Other Ambulatory Visit: Payer: Self-pay

## 2020-04-05 VITALS — BP 166/94 | HR 52 | Temp 98.2°F | Ht 68.0 in | Wt 181.0 lb

## 2020-04-05 DIAGNOSIS — M546 Pain in thoracic spine: Secondary | ICD-10-CM | POA: Diagnosis present

## 2020-04-05 DIAGNOSIS — G894 Chronic pain syndrome: Secondary | ICD-10-CM | POA: Diagnosis present

## 2020-04-05 DIAGNOSIS — M545 Low back pain, unspecified: Secondary | ICD-10-CM | POA: Diagnosis present

## 2020-04-05 DIAGNOSIS — G8929 Other chronic pain: Secondary | ICD-10-CM | POA: Diagnosis present

## 2020-04-05 DIAGNOSIS — R001 Bradycardia, unspecified: Secondary | ICD-10-CM | POA: Insufficient documentation

## 2020-04-05 DIAGNOSIS — Z79891 Long term (current) use of opiate analgesic: Secondary | ICD-10-CM | POA: Insufficient documentation

## 2020-04-05 DIAGNOSIS — M4802 Spinal stenosis, cervical region: Secondary | ICD-10-CM | POA: Insufficient documentation

## 2020-04-05 DIAGNOSIS — M542 Cervicalgia: Secondary | ICD-10-CM | POA: Insufficient documentation

## 2020-04-05 DIAGNOSIS — Z5181 Encounter for therapeutic drug level monitoring: Secondary | ICD-10-CM | POA: Insufficient documentation

## 2020-04-05 DIAGNOSIS — M5412 Radiculopathy, cervical region: Secondary | ICD-10-CM | POA: Diagnosis present

## 2020-04-05 DIAGNOSIS — M961 Postlaminectomy syndrome, not elsewhere classified: Secondary | ICD-10-CM

## 2020-04-05 MED ORDER — OXYCODONE HCL 5 MG PO TABS: 5.0000 mg | ORAL_TABLET | Freq: Four times a day (QID) | ORAL | 0 refills | Status: DC | PRN

## 2020-04-05 NOTE — Progress Notes (Signed)
Subjective:    Patient ID: Fred [name], male    DOB: 04-Aug-1950, 69 y.o.   MRN: [id number]  HPI: Fred [name] is a 69 y.o. male who returns for follow up appointment for chronic pain and medication refill. He states his pain is located in his neck radiating into his bilateral shoulders and mid- lower back pain. He rates his pain 3. His current exercise regime is walking and performing stretching exercises.  Fred [name] arrived to office bradycardic, apical pulse check. Fred [name] states his PCP has changed his anti-hypertensive medications. This provider placed a call to Fred [name] she stated the same as Fred [name], she will call Fred [name] PCP and was instructed to keep a log, she verbalizes understanding.  Fred [name] refuses ED or Urgent Care evaluation.    Fred [name] Morphine equivalent is 29.00  MME.  Oral Swab Performed today.    Pain Inventory Average Pain 5 Pain Right Now 3 My pain is sharp, stabbing and aching  In the last 24 hours, has pain interfered with the following? General activity 4 Relation with others 3 Enjoyment of life 3 What TIME of day is your pain at its worst? daytime, evening and night Sleep (in general) Fair  Pain is worse with: walking, bending, standing and some activites Pain improves with: medication and TENS Relief from Meds: 5  Family History  Problem Relation Age of Onset  . Stroke Mother   . Dementia Mother   . Heart disease Father   . Stroke Father        heat stroke  . Kidney disease Brother   . Dementia Brother    Social History   Socioeconomic History  . Marital status: Married    Spouse name: Not on file  . Number of children: 6  . Years of education: 12+  . Highest education level: Not on file  Occupational History  . Occupation: Retired  Tobacco Use  . Smoking status: Former Smoker    Packs/day: 1.00    Years: 30.00    Pack years: 30.00    Types: Cigarettes    Quit date: 05/25/2001    Years since  quitting: 18.8  . Smokeless tobacco: Former Systems developer    Types: Chew, Snuff    Quit date: 09/22/2001  Vaping Use  . Vaping Use: Never used  Substance and Sexual Activity  . Alcohol use: Not Currently  . Drug use: No  . Sexual activity: Not on file  Other Topics Concern  . Not on file  Social History Narrative   Lives with wife   Caffeine use: Coffee daily (1-2 per day)   Some soda   Right handed    Social Determinants of Health   Financial Resource Strain:   . Difficulty of Paying Living Expenses: Not on file  Food Insecurity:   . Worried About Charity fundraiser in the Last Year: Not on file  . Ran Out of Food in the Last Year: Not on file  Transportation Needs:   . Lack of Transportation (Medical): Not on file  . Lack of Transportation (Non-Medical): Not on file  Physical Activity:   . Days of Exercise per Week: Not on file  . Minutes of Exercise per Session: Not on file  Stress:   . Feeling of Stress : Not on file  Social Connections:   . Frequency of Communication with Friends and Family: Not on file  . Frequency of Social Gatherings with Friends and Family:  Not on file  . Attends Religious Services: Not on file  . Active Member of Clubs or Organizations: Not on file  . Attends Archivist Meetings: Not on file  . Marital Status: Not on file   Past Surgical History:  Procedure Laterality Date  . ANTERIOR CERVICAL DECOMP/DISCECTOMY FUSION  09-19-2010    dr Ronnald Ramp  @[facility]   . COLONOSCOPY W/ POLYPECTOMY  last one 03/ 2019  . CYSTOSCOPY N/A 05/17/2018   Procedure: CYSTOSCOPY/ REMOVAL OF BLADDER CALCULUS;  Surgeon: [name], MD;  Location: [facility];  Service: Urology;  Laterality: N/A;  ONLY NEEDS 45 MIN  . FINGER SURGERY  1970s   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 RIGHT MIDDLE FINGER  . IMPLANTATION BONE ANCHORED HEARING AID Right 2011    @[facility]    "no longer have this"  . LYMPHADENECTOMY Bilateral 08/04/2013   Procedure:  LYMPHADENECTOMY;  Surgeon: Dutch Gray, MD;  Location: [facility];  Service: Urology;  Laterality: Bilateral;  . PROSTATE SURGERY    . ROBOT ASSISTED LAPAROSCOPIC RADICAL PROSTATECTOMY N/A 08/04/2013   Procedure: ROBOTIC ASSISTED LAPAROSCOPIC RADICAL PROSTATECTOMY LEVEL 2;  Surgeon: Dutch Gray, MD;  Location: [facility];  Service: Urology;  Laterality: N/A;   Past Surgical History:  Procedure Laterality Date  . ANTERIOR CERVICAL DECOMP/DISCECTOMY FUSION  09-19-2010    dr Ronnald Ramp  @[facility]   . COLONOSCOPY W/ POLYPECTOMY  last one 03/ 2019  . CYSTOSCOPY N/A 05/17/2018   Procedure: CYSTOSCOPY/ REMOVAL OF BLADDER CALCULUS;  Surgeon: [name], MD;  [facility];  Service: Urology;  Laterality: N/A;  ONLY NEEDS 45 MIN  . FINGER SURGERY  1970s   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 RIGHT MIDDLE FINGER  . IMPLANTATION BONE ANCHORED HEARING AID Right 2011    @[facility]    "no longer have this"  . LYMPHADENECTOMY Bilateral 08/04/2013   Procedure: LYMPHADENECTOMY;  Surgeon: Dutch Gray, MD;  [facility];  Service: Urology;  Laterality: Bilateral;  . PROSTATE SURGERY    . ROBOT ASSISTED LAPAROSCOPIC RADICAL PROSTATECTOMY N/A 08/04/2013   Procedure: ROBOTIC ASSISTED LAPAROSCOPIC RADICAL PROSTATECTOMY LEVEL 2;  Surgeon: Dutch Gray, MD;  [facility];  Service: Urology;  Laterality: N/A;   Past Medical History:  Diagnosis Date  . Anticoagulated    xarelto  . Benign essential tremor 10/12/2019  . Depression   . Gait abnormality    MILD --- DOES NOT USE CANE  . 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 History of kidney stones   . 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 Hyperlipidemia   . Hypertension   . 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 Nocturia    twice  . Prostate cancer  ([place])    UROLOGIST-- DR [name]---  Stage T2a, Gleason 4+3, PSA 5.4,  s/p  radical prostatectomy 2015;   05-14-2018  per pt PSA undetectable  . Pulmonary embolism, bilateral ([place]) 12/02/2017   currently taking xarelto  . Wears dentures    upper  . Wears glasses   . Wears hearing aid in both ears    Pulse (!) 54   Temp 98.2 F (36.8 C)   Ht 5\' 8"  (1.727 m)   Wt 181 lb (82.1 kg)   SpO2  96%   BMI 27.52 kg/m   Opioid Risk Score:   Fall Risk Score:  `1  Depression screen PHQ 2/9  Depression screen [facility] 2/9 02/02/2020 12/30/2019 12/02/2019 11/07/2019 10/22/2018 08/23/2018 04/05/2018  Decreased Interest 0 0 0 0 0 0 1  Down, Depressed, Hopeless 0 0 0 0 1 1 1   PHQ - 2 Score 0 0 0 0 1 1 2   Altered sleeping - - - 0 - - -  Tired, decreased energy - - - 0 - - -  Change in appetite - - - 0 - - -  Feeling bad or failure about yourself  - - - 0 - - -  Trouble concentrating - - - 0 - - -  Moving slowly or fidgety/restless - - - 0 - - -  Suicidal thoughts - - - 0 - - -  PHQ-9 Score - - - 0 - - -    Review of Systems  Constitutional: Negative.   HENT: Negative.   Eyes: Negative.   Respiratory: Negative.   Cardiovascular: Negative.   Gastrointestinal: Negative.   Endocrine: Negative.   Genitourinary: Negative.   Musculoskeletal: Positive for back pain and neck pain.  Skin: Negative.   Neurological: Negative.   Hematological: Negative.   Psychiatric/Behavioral: Negative.   All other systems reviewed and are negative.      Objective:   Physical Exam Vitals and nursing note reviewed.  Constitutional:      Appearance: Normal appearance.  Neck:     Comments: Cervical Paraspinal Tenderness: C-5-C-6 Cardiovascular:     Rate and Rhythm: Normal rate and regular rhythm.     Pulses: Normal pulses.     Heart sounds: Normal heart sounds.  Pulmonary:     Effort: Pulmonary effort is normal.     Breath sounds: Normal breath sounds.  Musculoskeletal:     Cervical back: Normal range of motion  and neck supple.     Comments: Normal Muscle Bulk and Muscle Testing Reveals:  Upper Extremities: Full ROM and Muscle Strength 5/5 Bilateral AC Joint Tenderness Lumbar Paraspinal Tenderness: L-3-L-5 Lower Extremities: Full ROM and Muscle Strength 5/5 Arises from chair with Ease Narrow Based  Gait   Skin:    General: Skin is warm and dry.  Neurological:     Mental Status: He is alert and oriented to person, place, and time.  Psychiatric:        Mood and Affect: Mood normal.        Behavior: Behavior normal.           Assessment & Plan:  1.Cervical Postlaminectomy: Continue HEP and Continue current medication regime.04/05/2020. 2. Cervicalgia/ Cervical Radiculitis: Continue current medication regime. Continue to Monitor.04/05/2020 3. Chronic BilateralShoulder Pain:Continue HEP as Tolerated.Continue to monitor.04/05/2020 4. L2 compression fracture/ Chronic Bilateral Low Back Pain without Sciatica: Continue to Monitor:04/05/2020 Refilled:Oxycodone 5 mg one tablet every 6hours as needed for moderate pain. #120.We will continue the opioid monitoring program, this consists of regular clinic visits, examinations, urine drug screen, pill counts as well as use of [place] Controlled Substance Reporting system. A 12 month History has been reviewed on the [place] Controlled Substance Reporting Systemon 03/05/2020. 5. Polyarthralgia:No complaints today.Continue to Monitor.11/18//2021 6. Chronic Bilateral Thoracic Pain:Continue HEP as Tolerated. Continue Current medication regimen. Continue to Monitor.04/05/2020 7. Bradycardia: Apical Pulse checked. Fred [name] refuses ED or Urgent Care evaluation. Fred [name] was called she will call Fred [name] PCP. Mr. And Fred [name] reports PCP has made changes to his anti-hypertensive  medications. Also instructed to keep a log. They verbalize understanding.   F/U in 1 month

## 2020-04-09 LAB — DRUG TOX MONITOR 1 W/CONF, ORAL FLD
Amphetamines: NEGATIVE ng/mL (ref <10)
Barbiturates: NEGATIVE ng/mL (ref <10)
Benzodiazepines: NEGATIVE ng/mL (ref <0.50)
Buprenorphine: NEGATIVE ng/mL (ref <0.10)
Cocaine: NEGATIVE ng/mL (ref <5.0)
Codeine: NEGATIVE ng/mL (ref <2.5)
Cotinine: 77.4 ng/mL — ABNORMAL HIGH (ref <5.0)
Dihydrocodeine: NEGATIVE ng/mL (ref <2.5)
Fentanyl: NEGATIVE ng/mL (ref <0.10)
Heroin Metabolite: NEGATIVE ng/mL (ref <1.0)
Hydrocodone: NEGATIVE ng/mL (ref <2.5)
Hydromorphone: NEGATIVE ng/mL (ref <2.5)
MARIJUANA: NEGATIVE ng/mL (ref <2.5)
MDMA: NEGATIVE ng/mL (ref <10)
Meprobamate: NEGATIVE ng/mL (ref <2.5)
Methadone: NEGATIVE ng/mL (ref <5.0)
Morphine: NEGATIVE ng/mL (ref <2.5)
Nicotine Metabolite: POSITIVE ng/mL — AB (ref <5.0)
Norhydrocodone: NEGATIVE ng/mL (ref <2.5)
Noroxycodone: 11.3 ng/mL — ABNORMAL HIGH (ref <2.5)
Opiates: POSITIVE ng/mL — AB (ref <2.5)
Oxycodone: >250 ng/mL — ABNORMAL HIGH (ref <2.5)
Oxymorphone: NEGATIVE ng/mL (ref <2.5)
Phencyclidine: NEGATIVE ng/mL (ref <10)
Tapentadol: NEGATIVE ng/mL (ref <5.0)
Tramadol: NEGATIVE ng/mL (ref <5.0)
Zolpidem: NEGATIVE ng/mL (ref <5.0)

## 2020-04-09 LAB — DRUG TOX ALC METAB W/CON, ORAL FLD: Alcohol Metabolite: NEGATIVE ng/mL (ref <25)

## 2020-04-18 ENCOUNTER — Encounter: Payer: Self-pay | Admitting: Neurology

## 2020-04-18 ENCOUNTER — Ambulatory Visit: Payer: Medicare Other | Admitting: Neurology

## 2020-04-18 VITALS — BP 118/84 | HR 48 | Ht 68.0 in | Wt 179.8 lb

## 2020-04-18 DIAGNOSIS — R413 Other amnesia: Secondary | ICD-10-CM

## 2020-04-18 DIAGNOSIS — G250 Essential tremor: Secondary | ICD-10-CM

## 2020-04-18 NOTE — Progress Notes (Signed)
PATIENT: Fred [name] DOB: April 07, 1951  REASON FOR VISIT: follow up HISTORY FROM: patient  HISTORY OF PRESENT ILLNESS: Today 04/18/20 Fred [name] is a 69 year old male with history of mild memory disturbance, noted tremor to both hands in early 2021.  Currently taking propanolol 40 mg as needed for tremor, Aricept 10 mg daily, Namenda 5 mg daily.  For the last 1 month, has noted fatigue, heart rate in the 40s.  Around that time, Namenda 5 mg was added, propanolol was switched to as needed.  1 week ago, amlodipine 5 mg daily was started.  His wife usually gives him propanolol 3-4 times a week when she notes tremor.  Tremors in both hands, greater in the right, most notable with eating.  Memory is overall stable.  Does little driving, mostly because he and his wife are always together, he navigates.  More still has benign forgetfulness.  Has been more sleepy, sleeping 14 hours a day.  Has noted a few headaches.  Is on oxycodone for chronic back pain.  Presents today for evaluation accompanied by his wife.  MMSE 25/30 today.  HISTORY  10/12/2019 Dr. Jannifer Franklin: Fred [name] is a 69 year old right-handed white male with a history of a mild memory disturbance.  The patient was last seen through this office about 2 years ago, he has been maintained on Aricept to 10 mg at night.  He has tolerated the medication well.  Over the last 2 years he has had some slight increased problems with forgetfulness.  The is still able to operate a motor vehicle, he denies any difficulty with safety issues or with directions.  He indicates that his wife helps him out with keeping up with medications and appointments, she does the finances and always has.  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  He has a very strong family history of Alzheimer's disease with dementia affecting his mother and a brother and a sister.  Over the last 6 months he has noted some  intermittent tremor that affects both hands equally.  The tremor is present with activity of the hands, not present at rest.  He denies any tremor affecting the jaw or the head or neck or any vocal tremor.  He is not having a lot of troubles feeding himself or performing handwriting.  The patient has had some issues with sleeping quite a bit during the day, he will go to bed and sleep 12 hours, sometimes up to 15 hours a day.  The patient has a good energy level once he is awake during the day and he remains active.  He reports some bilateral foot numbness following a frostbite injury many years ago.  He does note some slight gait instability, he has not had any falls.  He reports no other focal numbness or weakness of the extremities.  He is sent to this office for further evaluation.   REVIEW OF SYSTEMS: Out of a complete 14 system review of symptoms, the patient complains only of the following symptoms, and all other reviewed systems are negative.  Tremor, memory loss  ALLERGIES: Allergies  Allergen Reactions  . Lyrica [Pregabalin] Other (See Comments)    Falls asleep while talking to people  . Percodan [Oxycodone-Aspirin] Hives and Nausea And Vomiting    Per wife " this was 40 yrs ago"  Approx.  1970s    HOME MEDICATIONS: Outpatient Medications Prior to Visit  Medication Sig  Dispense Refill  . albuterol (PROAIR HFA) 108 (90 Base) MCG/ACT inhaler Inhale 1-2 puffs into the lungs every 6 (six) hours as needed for wheezing or shortness of breath. 3 Inhaler 1  . amLODipine (NORVASC) 5 MG tablet Take 5 mg by mouth daily.    Marland Kitchen atorvastatin (LIPITOR) 20 MG tablet Take 20 mg by mouth daily.    Marland Kitchen atorvastatin (LIPITOR) 40 MG tablet Take 20 mg by mouth at bedtime.     . donepezil (ARICEPT) 10 MG tablet Take 10 mg by mouth daily.    . DULoxetine (CYMBALTA) 60 MG capsule Take 60 mg by mouth 2 (two) times daily.     Marland Kitchen lisinopril (ZESTRIL) 20 MG tablet Take 20 mg by mouth 2 (two) times daily.    .  memantine (NAMENDA) 5 MG tablet Take 5 mg by mouth at bedtime.    Marland Kitchen oxyCODONE (OXY IR/ROXICODONE) 5 MG immediate release tablet Take 1 tablet (5 mg total) by mouth every 6 (six) hours as needed for severe pain. 120 tablet 0  . polyethylene glycol-electrolytes (NULYTELY) 420 g solution Take by mouth as directed.    . propranolol (INDERAL) 40 MG tablet Take 40 mg by mouth as needed for tremors.     No facility-administered medications prior to visit.    PAST MEDICAL HISTORY: Past Medical History:  Diagnosis Date  . Anticoagulated    xarelto  . Benign essential tremor 10/12/2019  . Depression   . Gait abnormality    MILD --- DOES NOT USE CANE  . 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 History of kidney stones   . 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 Hyperlipidemia   . Hypertension   . 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 Nocturia    twice  . Prostate cancer ([place])    UROLOGIST-- DR [name]---  Stage T2a, Gleason 4+3, PSA 5.4,  s/p  radical prostatectomy 2015;   05-14-2018  per pt PSA undetectable  . Pulmonary embolism, bilateral ([place]) 12/02/2017   currently taking xarelto  . Wears dentures    upper  . Wears glasses   . Wears hearing aid in both ears     PAST SURGICAL HISTORY: Past Surgical History:  Procedure Laterality Date  . ANTERIOR CERVICAL DECOMP/DISCECTOMY FUSION  09-19-2010    dr Ronnald Ramp  @[facility]   . COLONOSCOPY W/ POLYPECTOMY  last one 03/ 2019  . CYSTOSCOPY N/A 05/17/2018   Procedure: CYSTOSCOPY/ REMOVAL OF BLADDER CALCULUS;  Surgeon: [name], MD;  Location: [facility];  Service: Urology;  Laterality: N/A;  ONLY NEEDS 45 MIN  . FINGER SURGERY  1970s   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 RIGHT MIDDLE FINGER  . IMPLANTATION BONE ANCHORED  HEARING AID Right 2011    @[facility]    "no longer have this"  . LYMPHADENECTOMY Bilateral 08/04/2013   Procedure: LYMPHADENECTOMY;  Surgeon: Dutch Gray, MD;  Location: [facility];  Service: Urology;  Laterality: Bilateral;  . PROSTATE SURGERY    . ROBOT ASSISTED LAPAROSCOPIC RADICAL PROSTATECTOMY N/A 08/04/2013   Procedure: ROBOTIC ASSISTED LAPAROSCOPIC RADICAL PROSTATECTOMY LEVEL 2;  Surgeon: Dutch Gray, MD;  Location[facility];  Service: Urology;  Laterality: N/A;    FAMILY HISTORY: Family History  Problem Relation  Age of Onset  . Stroke Mother   . Dementia Mother   . Heart disease Father   . Stroke Father        heat stroke  . Kidney disease Brother   . Dementia Brother     SOCIAL HISTORY: Social History   Socioeconomic History  . Marital status: Married    Spouse name: Not on file  . Number of children: 6  . Years of education: 12+  . Highest education level: Not on file  Occupational History  . Occupation: Retired  Tobacco Use  . Smoking status: Former Smoker    Packs/day: 1.00    Years: 30.00    Pack years: 30.00    Types: Cigarettes    Quit date: 05/25/2001    Years since quitting: 18.9  . Smokeless tobacco: Former Systems developer    Types: Chew, Snuff    Quit date: 09/22/2001  Vaping Use  . Vaping Use: Never used  Substance and Sexual Activity  . Alcohol use: Not Currently  . Drug use: No  . Sexual activity: Not on file  Other Topics Concern  . Not on file  Social History Narrative   Lives with wife   Caffeine use: Coffee daily (1-2 per day)   Some soda   Right handed    Social Determinants of Health   Financial Resource Strain:   . Difficulty of Paying Living Expenses: Not on file  Food Insecurity:   . Worried About Charity fundraiser in the Last Year: Not on file  . Ran Out of Food in the Last Year: Not on file  Transportation Needs:   . Lack of Transportation (Medical): Not on file  . Lack of Transportation (Non-Medical): Not on file  Physical Activity:   . Days  of Exercise per Week: Not on file  . Minutes of Exercise per Session: Not on file  Stress:   . Feeling of Stress : Not on file  Social Connections:   . Frequency of Communication with Friends and Family: Not on file  . Frequency of Social Gatherings with Friends and Family: Not on file  . Attends Religious Services: Not on file  . Active Member of Clubs or Organizations: Not on file  . Attends Archivist Meetings: Not on file  . Marital Status: Not on file  Intimate Partner Violence:   . Fear of Current or Ex-Partner: Not on file  . Emotionally Abused: Not on file  . Physically Abused: Not on file  . Sexually Abused: Not on file      PHYSICAL EXAM  Vitals:   04/18/20 1040  BP: 118/84  Pulse: (!) 48  Weight: 179 lb 12.8 oz (81.6 kg)  Height: 5\' 8"  (1.727 m)   Body mass index is 27.34 kg/m.  Generalized: Well developed, in no acute distress  MMSE - Mini Mental State Exam 04/18/2020 10/12/2019 02/02/2018  Orientation to time 3 3 4   Orientation to Place 5 3 4   Registration 3 3 3   Attention/ Calculation 4 5 2   Recall 2 3 3   Language- name 2 objects 2 2 2   Language- repeat 0 1 1  Language- follow 3 step command 3 3 3   Language- read & follow direction 1 1 1   Write a sentence 1 1 1   Copy design 1 1 0  Total score 25 26 24     Neurological examination  Mentation: Alert oriented to time, place, history taking. Follows all commands speech and language fluent Cranial  nerve II-XII: Pupils were equal round reactive to light. Extraocular movements were full, visual field were full on confrontational test. Facial sensation and strength were normal. Head turning and shoulder shrug  were normal and symmetric. Motor: The motor testing reveals 5 over 5 strength of all 4 extremities. Good symmetric motor tone is noted throughout.  Sensory: Sensory testing is intact to soft touch on all 4 extremities. No evidence of extinction is noted.  Coordination: Cerebellar testing reveals  good finger-nose-finger and heel-to-shin bilaterally.  Mild tremor with finger-nose-finger. Gait and station: Gait is normal, but does have chronic stagger at baseline.  Symmetric arm swing, good turns, overall steady. Reflexes: Deep tendon reflexes are symmetric and normal bilaterally.   DIAGNOSTIC DATA (LABS, IMAGING, TESTING) - I reviewed patient records, labs, notes, testing and imaging myself where available.  Lab Results  Component Value Date   WBC 9.9 05/14/2018   HGB 13.9 05/14/2018   HCT 42.9 05/14/2018   MCV 87.7 05/14/2018   PLT 201 05/14/2018      Component Value Date/Time   NA 139 05/14/2018 1419   K 3.7 05/14/2018 1419   CL 102 05/14/2018 1419   CO2 27 05/14/2018 1419   GLUCOSE 85 05/14/2018 1419   BUN 17 05/14/2018 1419   CREATININE 1.10 05/14/2018 1419   CALCIUM 9.1 05/14/2018 1419   PROT 6.4 (L) 12/03/2017 0819   ALBUMIN 3.6 12/03/2017 0819   AST 19 12/03/2017 0819   ALT 20 12/03/2017 0819   ALKPHOS 64 12/03/2017 0819   BILITOT 0.8 12/03/2017 0819   GFRNONAA >60 05/14/2018 1419   GFRAA >60 05/14/2018 1419   No results found for: CHOL, HDL, LDLCALC, LDLDIRECT, TRIG, CHOLHDL No results found for: HGBA1C Lab Results  Component Value Date   VITAMINB12 268 07/22/2017   No results found for: TSH    ASSESSMENT AND PLAN 69 y.o. year old male  has a past medical history of Anticoagulated, Benign essential tremor (10/12/2019), Depression, Gait abnormality, Hearing loss, History of kidney stones, History of traumatic brain injury (10/2008), Hyperlipidemia, Hypertension, Mild memory disturbance, Nocturia, Prostate cancer ([place]), Pulmonary embolism, bilateral ([place]) (12/02/2017), Wears dentures, Wears glasses, and Wears hearing aid in both ears. here with:  1.  Mild memory disturbance, MMSE 25/30 today 2.  Strong family history of Alzheimer's disease 3.  Tremor, likely essential tremor -No signs of Parkinson's disease on exam -Heart rate in the 40s (48), reported  fatigue for the last 1 month, recommend discontinuing propanolol, if no improvement in symptoms in the next few days, needs to contact PCP (symptoms over last month, started amlodipine 1 week ago) -Continue Namenda, only taking 5 mg daily, will not adjust dosing since potentially having adverse effect from propanolol, possibly amlodipine? -Continue Aricept 10 mg daily -Follow-up in 6 months or sooner if needed, will send note over to PCP  I spent 30 minutes of face-to-face and non-face-to-face time with patient.  This included previsit chart review, lab review, study review, order entry, electronic health record documentation, patient education.  Butler Denmark, AGNP-C, DNP 04/18/2020, 10:58 AM [facility] [address], [place], [place] [address] [phone]

## 2020-04-18 NOTE — Patient Instructions (Signed)
Stop the propranolol due to low HR and fatigue Check the HR and BP at home If no better in a few days, please contact PCP See you back in 6 months

## 2020-04-19 NOTE — Progress Notes (Signed)
I have read the note, and I agree with the clinical assessment and plan.  Fred [name]   

## 2020-05-04 ENCOUNTER — Encounter: Payer: Medicare Other | Attending: Registered Nurse | Admitting: Registered Nurse

## 2020-05-04 ENCOUNTER — Other Ambulatory Visit: Payer: Self-pay

## 2020-05-04 ENCOUNTER — Encounter: Payer: Self-pay | Admitting: Registered Nurse

## 2020-05-04 VITALS — BP 117/79 | HR 64 | Temp 98.1°F | Ht 68.0 in | Wt 178.0 lb

## 2020-05-04 DIAGNOSIS — M546 Pain in thoracic spine: Secondary | ICD-10-CM | POA: Insufficient documentation

## 2020-05-04 DIAGNOSIS — M545 Low back pain, unspecified: Secondary | ICD-10-CM | POA: Diagnosis present

## 2020-05-04 DIAGNOSIS — M5412 Radiculopathy, cervical region: Secondary | ICD-10-CM | POA: Insufficient documentation

## 2020-05-04 DIAGNOSIS — G8929 Other chronic pain: Secondary | ICD-10-CM | POA: Diagnosis present

## 2020-05-04 DIAGNOSIS — G894 Chronic pain syndrome: Secondary | ICD-10-CM | POA: Diagnosis present

## 2020-05-04 DIAGNOSIS — Z79891 Long term (current) use of opiate analgesic: Secondary | ICD-10-CM | POA: Diagnosis present

## 2020-05-04 DIAGNOSIS — M542 Cervicalgia: Secondary | ICD-10-CM | POA: Insufficient documentation

## 2020-05-04 DIAGNOSIS — M961 Postlaminectomy syndrome, not elsewhere classified: Secondary | ICD-10-CM | POA: Insufficient documentation

## 2020-05-04 DIAGNOSIS — Z5181 Encounter for therapeutic drug level monitoring: Secondary | ICD-10-CM | POA: Insufficient documentation

## 2020-05-04 DIAGNOSIS — M4802 Spinal stenosis, cervical region: Secondary | ICD-10-CM | POA: Insufficient documentation

## 2020-05-04 MED ORDER — OXYCODONE HCL 5 MG PO TABS: 5.0000 mg | ORAL_TABLET | Freq: Four times a day (QID) | ORAL | 0 refills | Status: DC | PRN

## 2020-05-04 NOTE — Progress Notes (Signed)
Subjective:    Patient ID: Fred [name], male    DOB: 1950-10-03, 69 y.o.   MRN: [id number]  HPI: Fred [name] is a 69 y.o. male who returns for follow up appointment for chronic pain and medication refill. He states his pain is located in his neck radiating into his bilateral shoulders and mid- lower back pain. He rates his pain 5. His current exercise regime is walking and performing stretching exercises.  [name] Morphine equivalent is 30.00  MME.    Last Oral Swab was Performed on 04/05/2020, it was consistent.    Pain Inventory Average Pain 4 Pain Right Now 5 My pain is sharp, stabbing, tingling and aching  In the last 24 hours, has pain interfered with the following? General activity 0 Relation with others 0 Enjoyment of life 0 What TIME of day is your pain at its worst? morning , daytime, evening, night and varies Sleep (in general) Fair  Pain is worse with: bending, sitting and some activites Pain improves with: rest and medication Relief from Meds: 4  Family History  Problem Relation Age of Onset  . Stroke Mother   . Dementia Mother   . Heart disease Father   . Stroke Father        heat stroke  . Kidney disease Brother   . Dementia Brother    Social History   Socioeconomic History  . Marital status: Married    Spouse name: Not on file  . Number of children: 6  . Years of education: 12+  . Highest education level: Not on file  Occupational History  . Occupation: Retired  Tobacco Use  . Smoking status: Former Smoker    Packs/day: 1.00    Years: 30.00    Pack years: 30.00    Types: Cigarettes    Quit date: 05/25/2001    Years since quitting: 18.9  . Smokeless tobacco: Former Systems developer    Types: Chew, Snuff    Quit date: 09/22/2001  Vaping Use  . Vaping Use: Never used  Substance and Sexual Activity  . Alcohol use: Not Currently  . Drug use: No  . Sexual activity: Not on file  Other Topics Concern  . Not on file  Social History Narrative    Lives with wife   Caffeine use: Coffee daily (1-2 per day)   Some soda   Right handed    Social Determinants of Health   Financial Resource Strain: Not on file  Food Insecurity: Not on file  Transportation Needs: Not on file  Physical Activity: Not on file  Stress: Not on file  Social Connections: Not on file   Past Surgical History:  Procedure Laterality Date  . ANTERIOR CERVICAL DECOMP/DISCECTOMY FUSION  09-19-2010    dr Ronnald Ramp  @[facility]   . COLONOSCOPY W/ POLYPECTOMY  last one 03/ 2019  . CYSTOSCOPY N/A 05/17/2018   Procedure: CYSTOSCOPY/ REMOVAL OF BLADDER CALCULUS;  Surgeon: [name], MD;  Location: [facility];  Service: Urology;  Laterality: N/A;  ONLY NEEDS 45 MIN  . FINGER SURGERY  1970s   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 RIGHT MIDDLE FINGER  . IMPLANTATION BONE ANCHORED HEARING AID Right 2011    @[facility]    "no longer have this"  . LYMPHADENECTOMY Bilateral 08/04/2013   Procedure: LYMPHADENECTOMY;  Surgeon: Dutch Gray, MD;  Location: [facility];  Service: Urology;  Laterality: Bilateral;  . PROSTATE SURGERY    . ROBOT ASSISTED LAPAROSCOPIC RADICAL PROSTATECTOMY N/A 08/04/2013  Procedure: ROBOTIC ASSISTED LAPAROSCOPIC RADICAL PROSTATECTOMY LEVEL 2;  Surgeon: Dutch Gray, MD;  Location: [facility];  Service: Urology;  Laterality: N/A;   Past Surgical History:  Procedure Laterality Date  . ANTERIOR CERVICAL DECOMP/DISCECTOMY FUSION  09-19-2010    dr Ronnald Ramp  @[facility]   . COLONOSCOPY W/ POLYPECTOMY  last one 03/ 2019  . CYSTOSCOPY N/A 05/17/2018   Procedure: CYSTOSCOPY/ REMOVAL OF BLADDER CALCULUS;  Surgeon: [name], MD;  Location: [facility];  Service: Urology;  Laterality: N/A;  ONLY NEEDS 45 MIN  . FINGER SURGERY  1970s   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 RIGHT MIDDLE FINGER  . IMPLANTATION BONE ANCHORED HEARING AID Right 2011    @[facility]    "no longer have this"  . LYMPHADENECTOMY Bilateral 08/04/2013   Procedure: LYMPHADENECTOMY;   Surgeon: Dutch Gray, MD;  Location: [facility];  Service: Urology;  Laterality: Bilateral;  . PROSTATE SURGERY    . ROBOT ASSISTED LAPAROSCOPIC RADICAL PROSTATECTOMY N/A 08/04/2013   Procedure: ROBOTIC ASSISTED LAPAROSCOPIC RADICAL PROSTATECTOMY LEVEL 2;  Surgeon: Dutch Gray, MD;  Location: [facility];  Service: Urology;  Laterality: N/A;   Past Medical History:  Diagnosis Date  . Anticoagulated    xarelto  . Benign essential tremor 10/12/2019  . Depression   . Gait abnormality    MILD --- DOES NOT USE CANE  . 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 History of kidney stones   . 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 Hyperlipidemia   . Hypertension   . 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 Nocturia    twice  . Prostate cancer ([place])    UROLOGIST-- DR [name]---  Stage T2a, Gleason 4+3, PSA 5.4,  s/p  radical prostatectomy 2015;   05-14-2018  per pt PSA undetectable  . Pulmonary embolism, bilateral ([place]) 12/02/2017   currently taking xarelto  . Wears dentures    upper  . Wears glasses   . Wears hearing aid in both ears    BP 117/79   Pulse 64   Temp 98.1 F (36.7 C)   Ht 5\' 8"  (1.727 m)   Wt 178 lb (80.7 kg)   SpO2 98%   BMI 27.06 kg/m   Opioid Risk Score:   Fall Risk Score:  `1  Depression screen PHQ 2/9  Depression screen [facility] 2/9 02/02/2020 12/30/2019 12/02/2019 11/07/2019 10/22/2018 08/23/2018 04/05/2018  Decreased Interest 0 0 0 0 0 0 1  Down, Depressed, Hopeless 0 0 0 0 1 1 1   PHQ - 2 Score 0 0 0 0 1 1 2   Altered sleeping - - - 0 - - -  Tired, decreased energy - - - 0 - - -  Change in appetite - - - 0 - - -  Feeling bad or failure about yourself  - - - 0 - - -  Trouble concentrating - - - 0 - - -  Moving slowly or fidgety/restless - - - 0 - - -  Suicidal thoughts - - - 0 - -  -  PHQ-9 Score - - - 0 - - -    Review of Systems  Constitutional: Negative.   HENT: Negative.   Eyes: Negative.   Respiratory: Negative.   Cardiovascular: Negative.  Gastrointestinal: Negative.   Endocrine: Negative.   Genitourinary: Negative.   Musculoskeletal: Positive for back pain.  Skin: Negative.   Allergic/Immunologic: Negative.   Neurological: Positive for numbness.       Tingling   Hematological: Negative.   Psychiatric/Behavioral: Negative.   All other systems reviewed and are negative.      Objective:   Physical Exam Vitals and nursing note reviewed.  Constitutional:      Appearance: Normal appearance.  Neck:     Comments: Cervical Paraspinal Tenderness: C-5-C-6  Cardiovascular:     Rate and Rhythm: Normal rate and regular rhythm.     Pulses: Normal pulses.     Heart sounds: Normal heart sounds.  Pulmonary:     Effort: Pulmonary effort is normal.     Breath sounds: Normal breath sounds.  Musculoskeletal:     Cervical back: Normal range of motion and neck supple.     Comments: Normal Muscle Bulk and Muscle Testing Reveals:  Upper Extremities: Full ROM and Muscle Strength 5/5 Bilateral AC Joint Tenderness Thoracic Paraspinal Tenderness: T-7-T-9 Lumbar Paraspinal Tenderness: L-3-L-5 Lower Extremities: Full ROM and Muscle Strength 5/5 Arises from chair with ease Narrow Based  Gait   Skin:    General: Skin is warm and dry.  Neurological:     Mental Status: He is alert and oriented to person, place, and time.  Psychiatric:        Mood and Affect: Mood normal.        Behavior: Behavior normal.           Assessment & Plan:  1.Cervical Postlaminectomy: Continue HEP and Continue current medication regime.05/04/2020. 2. Cervicalgia/ Cervical Radiculitis: Continue current medication regime. Continue to Monitor.05/04/2020 3. Chronic BilateralShoulder Pain:Continue HEP as Tolerated.Continue to monitor.05/04/2020 4. L2 compression fracture/  Chronic Bilateral Low Back Pain without Sciatica: Continue to Monitor:05/04/2020 Refilled:Oxycodone 5 mg one tablet every 6hours as needed for moderate pain. #120.We will continue the opioid monitoring program, this consists of regular clinic visits, examinations, urine drug screen, pill counts as well as use of [place] Controlled Substance Reporting system. A 12 month History has been reviewed on the [place] Controlled Substance Reporting Systemon12/17/2021. 5. Polyarthralgia:No complaints today.Continue to Monitor.12/17//2021 6. Chronic Bilateral Thoracic Pain:Continue HEP as Tolerated. Continue Current medication regimen. Continue to Monitor.05/04/2020  F/U in 1 month

## 2020-06-01 ENCOUNTER — Encounter: Payer: Self-pay | Admitting: Registered Nurse

## 2020-06-01 ENCOUNTER — Other Ambulatory Visit: Payer: Self-pay

## 2020-06-01 ENCOUNTER — Encounter: Payer: Medicare Other | Attending: Registered Nurse | Admitting: Registered Nurse

## 2020-06-01 VITALS — BP 109/75 | HR 70 | Temp 98.2°F | Ht 68.0 in | Wt 178.0 lb

## 2020-06-01 DIAGNOSIS — G894 Chronic pain syndrome: Secondary | ICD-10-CM | POA: Insufficient documentation

## 2020-06-01 DIAGNOSIS — G8929 Other chronic pain: Secondary | ICD-10-CM | POA: Insufficient documentation

## 2020-06-01 DIAGNOSIS — M4802 Spinal stenosis, cervical region: Secondary | ICD-10-CM | POA: Diagnosis not present

## 2020-06-01 DIAGNOSIS — Z79891 Long term (current) use of opiate analgesic: Secondary | ICD-10-CM | POA: Insufficient documentation

## 2020-06-01 DIAGNOSIS — M545 Low back pain, unspecified: Secondary | ICD-10-CM | POA: Diagnosis not present

## 2020-06-01 DIAGNOSIS — M5412 Radiculopathy, cervical region: Secondary | ICD-10-CM | POA: Insufficient documentation

## 2020-06-01 DIAGNOSIS — M546 Pain in thoracic spine: Secondary | ICD-10-CM | POA: Insufficient documentation

## 2020-06-01 DIAGNOSIS — M542 Cervicalgia: Secondary | ICD-10-CM | POA: Diagnosis not present

## 2020-06-01 DIAGNOSIS — M961 Postlaminectomy syndrome, not elsewhere classified: Secondary | ICD-10-CM | POA: Insufficient documentation

## 2020-06-01 DIAGNOSIS — Z5181 Encounter for therapeutic drug level monitoring: Secondary | ICD-10-CM | POA: Diagnosis not present

## 2020-06-01 MED ORDER — OXYCODONE HCL 5 MG PO TABS: 5.0000 mg | ORAL_TABLET | Freq: Four times a day (QID) | ORAL | 0 refills | Status: DC | PRN

## 2020-06-01 NOTE — Progress Notes (Signed)
Subjective:    Patient ID: KIPPER BUCH, male    DOB: [birth year], 70 y.o.   MRN: [id number]  HPI: Fred [name] is a 70 y.o. male who returns for follow up appointment for chronic pain and medication refill. He states his pain is located in his neck radiating into his bilateral shoulders and mid- lower back pain. He rates his pain 7. His  current exercise regime is walking and performing stretching exercises.  Mr. Reason Morphine equivalent is 30.00 MME.    Last Oral Swab was Performed on 04/05/2020, it was consistent.    Pain Inventory Average Pain 5 Pain Right Now 7 My pain is stabbing, tingling and aching  In the last 24 hours, has pain interfered with the following? General activity 6 Relation with others 8 Enjoyment of life 7 What TIME of day is your pain at its worst? daytime, evening and night Sleep (in general) Good  Pain is worse with: walking, bending and standing Pain improves with: medication Relief from Meds: 5  Family History  Problem Relation Age of Onset  . Stroke Mother   . Dementia Mother   . Heart disease Father   . Stroke Father        heat stroke  . Kidney disease Brother   . Dementia Brother    Social History   Socioeconomic History  . Marital status: Married    Spouse name: Not on file  . Number of children: 6  . Years of education: 12+  . Highest education level: Not on file  Occupational History  . Occupation: Retired  Tobacco Use  . Smoking status: Former Smoker    Packs/day: 1.00    Years: 30.00    Pack years: 30.00    Types: Cigarettes    Quit date: 05/25/2001    Years since quitting: 19.0  . Smokeless tobacco: Former Systems developer    Types: Chew, Snuff    Quit date: 09/22/2001  Vaping Use  . Vaping Use: Never used  Substance and Sexual Activity  . Alcohol use: Not Currently  . Drug use: No  . Sexual activity: Not on file  Other Topics Concern  . Not on file  Social History Narrative   Lives with wife   Caffeine use: Coffee  daily (1-2 per day)   Some soda   Right handed    Social Determinants of Health   Financial Resource Strain: Not on file  Food Insecurity: Not on file  Transportation Needs: Not on file  Physical Activity: Not on file  Stress: Not on file  Social Connections: Not on file   Past Surgical History:  Procedure Laterality Date  . ANTERIOR CERVICAL DECOMP/DISCECTOMY FUSION  09-19-2010    dr Ronnald Ramp  @[facility]   . COLONOSCOPY W/ POLYPECTOMY  last one 03/ 2019  . CYSTOSCOPY N/A 05/17/2018   Procedure: CYSTOSCOPY/ REMOVAL OF BLADDER CALCULUS;  Surgeon: [name], MD;  Location: [facility];  Service: Urology;  Laterality: N/A;  ONLY NEEDS 45 MIN  . FINGER SURGERY  1970s   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 RIGHT MIDDLE FINGER  . IMPLANTATION BONE ANCHORED HEARING AID Right 2011    @[facility]    "no longer have this"  . LYMPHADENECTOMY Bilateral 08/04/2013   Procedure: LYMPHADENECTOMY;  Surgeon: Dutch Gray, MD;  Location: [facility];  Service: Urology;  Laterality: Bilateral;  . PROSTATE SURGERY    . ROBOT ASSISTED LAPAROSCOPIC RADICAL PROSTATECTOMY N/A 08/04/2013   Procedure: ROBOTIC ASSISTED LAPAROSCOPIC RADICAL PROSTATECTOMY LEVEL  2;  Surgeon: Dutch Gray, MD;  Location[facility];  Service: Urology;  Laterality: N/A;   Past Surgical History:  Procedure Laterality Date  . ANTERIOR CERVICAL DECOMP/DISCECTOMY FUSION  09-19-2010    dr Ronnald Ramp  @[facility]   . COLONOSCOPY W/ POLYPECTOMY  last one 03/ 2019  . CYSTOSCOPY N/A 05/17/2018   Procedure: CYSTOSCOPY/ REMOVAL OF BLADDER CALCULUS;  Surgeon: [name], MD;  [facility];  Service: Urology;  Laterality: N/A;  ONLY NEEDS 45 MIN  . FINGER SURGERY  1970s   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 RIGHT MIDDLE FINGER  . IMPLANTATION BONE ANCHORED HEARING AID Right 2011    @[facility]    "no longer have this"  . LYMPHADENECTOMY Bilateral 08/04/2013   Procedure: LYMPHADENECTOMY;  Surgeon: Dutch Gray, MD;  [facility];   Service: Urology;  Laterality: Bilateral;  . PROSTATE SURGERY    . ROBOT ASSISTED LAPAROSCOPIC RADICAL PROSTATECTOMY N/A 08/04/2013   Procedure: ROBOTIC ASSISTED LAPAROSCOPIC RADICAL PROSTATECTOMY LEVEL 2;  Surgeon: Dutch Gray, MD;  [facility];  Service: Urology;  Laterality: N/A;   Past Medical History:  Diagnosis Date  . Anticoagulated    xarelto  . Benign essential tremor 10/12/2019  . Depression   . Gait abnormality    MILD --- DOES NOT USE CANE  . 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 History of kidney stones   . 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 Hyperlipidemia   . Hypertension   . 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 Nocturia    twice  . Prostate cancer ([place])    UROLOGIST-- DR [name]---  Stage T2a, Gleason 4+3, PSA 5.4,  s/p  radical prostatectomy 2015;   05-14-2018  per pt PSA undetectable  . Pulmonary embolism, bilateral ([place]) 12/02/2017   currently taking xarelto  . Wears dentures    upper  . Wears glasses   . Wears hearing aid in both ears    BP 109/75   Pulse 70   Temp 98.2 F (36.8 C)   Ht 5\' 8"  (1.727 m)   Wt 178 lb (80.7 kg)   SpO2 96%   BMI 27.06 kg/m   Opioid Risk Score:   Fall Risk Score:  `1  Depression screen PHQ 2/9  Depression screen [facility] 2/9 02/02/2020 12/30/2019 12/02/2019 11/07/2019 10/22/2018 08/23/2018 04/05/2018  Decreased Interest 0 0 0 0 0 0 1  Down, Depressed, Hopeless 0 0 0 0 1 1 1   PHQ - 2 Score 0 0 0 0 1 1 2   Altered sleeping - - - 0 - - -  Tired, decreased energy - - - 0 - - -  Change in appetite - - - 0 - - -  Feeling bad or failure about yourself  - - - 0 - - -  Trouble concentrating - - - 0 - - -  Moving slowly or fidgety/restless - - - 0 - - -  Suicidal thoughts - - - 0 - - -  PHQ-9 Score - - - 0 - - -    Review  of Systems  Constitutional: Negative.   HENT: Negative.   Eyes: Negative.   Respiratory: Negative.   Cardiovascular: Negative.   Gastrointestinal: Negative.   Endocrine:  Negative.   Genitourinary: Negative.   Musculoskeletal: Positive for back pain and neck pain.  Allergic/Immunologic: Negative.   Neurological: Negative.   Hematological: Negative.   Psychiatric/Behavioral: Negative.   All other systems reviewed and are negative.      Objective:   Physical Exam Vitals and nursing note reviewed.  Constitutional:      Appearance: Normal appearance.  Neck:     Comments: Cervical Paraspinal Tenderness: C-5-C-6 Cardiovascular:     Rate and Rhythm: Normal rate and regular rhythm.     Pulses: Normal pulses.     Heart sounds: Normal heart sounds.  Pulmonary:     Effort: Pulmonary effort is normal.     Breath sounds: Normal breath sounds.  Musculoskeletal:     Cervical back: Normal range of motion and neck supple.     Comments: Normal Muscle Bulk and Muscle Testing Reveals:  Upper Extremities: Full ROM and Muscle Strength 5/5 Thoracic Paraspinal Tenderness: T-7-T-9 Lumbar Paraspinal Tenderness: L-3-L-5 Lower Extremities: Full ROM and Muscle Strength 5/5 Arises from chair with ease Narrow Based  Gait   Skin:    General: Skin is warm and dry.  Neurological:     Mental Status: He is alert and oriented to person, place, and time.  Psychiatric:        Mood and Affect: Mood normal.        Behavior: Behavior normal.           Assessment & Plan:  1.Cervical Postlaminectomy: Continue HEP and Continue current medication regime.06/01/2020. 2. Cervicalgia/ Cervical Radiculitis: Continue current medication regime. Continue to Monitor.06/01/2020 3. Chronic BilateralShoulder Pain:Continue HEP as Tolerated.Continue to monitor.06/01/2020 4. L2 compression fracture/ Chronic Bilateral Low Back Pain without Sciatica: Continue to Monitor:06/01/2020 Refilled:Oxycodone 5 mg one  tablet every 6hours as needed for moderate pain. #120.We will continue the opioid monitoring program, this consists of regular clinic visits, examinations, urine drug screen, pill counts as well as use of [place] Controlled Substance Reporting system. A 12 month History has been reviewed on the [place] Controlled Substance Reporting Systemon01/14/2022. 5. Polyarthralgia:No complaints today.Continue to Monitor.01/14//2022 6. Chronic Bilateral Thoracic Pain:Continue HEP as Tolerated. Continue Current medication regimen. Continue to Monitor.06/01/2020  F/U in 1 month

## 2020-06-07 DIAGNOSIS — E782 Mixed hyperlipidemia: Secondary | ICD-10-CM | POA: Diagnosis not present

## 2020-06-20 DIAGNOSIS — H53021 Refractive amblyopia, right eye: Secondary | ICD-10-CM | POA: Diagnosis not present

## 2020-06-20 DIAGNOSIS — Z961 Presence of intraocular lens: Secondary | ICD-10-CM | POA: Diagnosis not present

## 2020-06-20 DIAGNOSIS — Z9889 Other specified postprocedural states: Secondary | ICD-10-CM | POA: Diagnosis not present

## 2020-07-04 ENCOUNTER — Other Ambulatory Visit: Payer: Self-pay

## 2020-07-04 ENCOUNTER — Encounter: Payer: Self-pay | Admitting: Registered Nurse

## 2020-07-04 ENCOUNTER — Encounter: Payer: Medicare Other | Attending: Registered Nurse | Admitting: Registered Nurse

## 2020-07-04 VITALS — BP 120/79 | HR 61 | Temp 98.0°F | Ht 68.0 in | Wt 179.0 lb

## 2020-07-04 DIAGNOSIS — Z5181 Encounter for therapeutic drug level monitoring: Secondary | ICD-10-CM | POA: Insufficient documentation

## 2020-07-04 DIAGNOSIS — M545 Low back pain, unspecified: Secondary | ICD-10-CM | POA: Diagnosis not present

## 2020-07-04 DIAGNOSIS — G8929 Other chronic pain: Secondary | ICD-10-CM | POA: Insufficient documentation

## 2020-07-04 DIAGNOSIS — G894 Chronic pain syndrome: Secondary | ICD-10-CM | POA: Diagnosis not present

## 2020-07-04 DIAGNOSIS — Z79891 Long term (current) use of opiate analgesic: Secondary | ICD-10-CM | POA: Insufficient documentation

## 2020-07-04 DIAGNOSIS — M546 Pain in thoracic spine: Secondary | ICD-10-CM | POA: Insufficient documentation

## 2020-07-04 DIAGNOSIS — M961 Postlaminectomy syndrome, not elsewhere classified: Secondary | ICD-10-CM | POA: Insufficient documentation

## 2020-07-04 MED ORDER — OXYCODONE HCL 5 MG PO TABS: 5.0000 mg | ORAL_TABLET | Freq: Four times a day (QID) | ORAL | 0 refills | Status: DC | PRN

## 2020-07-04 NOTE — Progress Notes (Signed)
Subjective:    Patient ID: Fred [name], male    DOB: 1951-05-06, 70 y.o.   MRN: [id number]  HPI: Fred [name] is a 70 y.o. male who returns for follow up appointment for chronic pain and medication refill. He states his pain is located in his neck radiating into her bilateral shoulders and mid- lower back pain. She rates her pain 6. Her current exercise regime is walking and performing stretching exercises.  Fred [name] Morphine equivalent is 27.00  MME.    Last Oral Swab was Performed on 04/05/2020, it was consistent.   Pain Inventory Average Pain 5 Pain Right Now 6 My pain is intermittent, sharp, stabbing, tingling and aching  In the last 24 hours, has pain interfered with the following? General activity 5 Relation with others 6 Enjoyment of life 5 What TIME of day is your pain at its worst? morning , daytime, evening and night Sleep (in general) Fair  Pain is worse with: walking, bending and sitting Pain improves with: rest, therapy/exercise, medication and TENS Relief from Meds: 4  Family History  Problem Relation Age of Onset  . Stroke Mother   . Dementia Mother   . Heart disease Father   . Stroke Father        heat stroke  . Kidney disease Brother   . Dementia Brother    Social History   Socioeconomic History  . Marital status: Married    Spouse name: Not on file  . Number of children: 6  . Years of education: 12+  . Highest education level: Not on file  Occupational History  . Occupation: Retired  Tobacco Use  . Smoking status: Former Smoker    Packs/day: 1.00    Years: 30.00    Pack years: 30.00    Types: Cigarettes    Quit date: 05/25/2001    Years since quitting: 19.1  . Smokeless tobacco: Former Systems developer    Types: Chew, Snuff    Quit date: 09/22/2001  Vaping Use  . Vaping Use: Never used  Substance and Sexual Activity  . Alcohol use: Not Currently  . Drug use: No  . Sexual activity: Not on file  Other Topics Concern  . Not on file   Social History Narrative   Lives with wife   Caffeine use: Coffee daily (1-2 per day)   Some soda   Right handed    Social Determinants of Health   Financial Resource Strain: Not on file  Food Insecurity: Not on file  Transportation Needs: Not on file  Physical Activity: Not on file  Stress: Not on file  Social Connections: Not on file   Past Surgical History:  Procedure Laterality Date  . ANTERIOR CERVICAL DECOMP/DISCECTOMY FUSION  09-19-2010    dr Ronnald [name]  @[facility]   . COLONOSCOPY W/ POLYPECTOMY  last one 03/ 2019  . CYSTOSCOPY N/A 05/17/2018   Procedure: CYSTOSCOPY/ REMOVAL OF BLADDER CALCULUS;  Surgeon: Fred [name];  Location: [facility];  Service: Urology;  Laterality: N/A;  ONLY NEEDS 45 MIN  . FINGER SURGERY  1970s   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 RIGHT MIDDLE FINGER  . IMPLANTATION BONE ANCHORED HEARING AID Right 2011    @[facility]    "no longer have this"  . LYMPHADENECTOMY Bilateral 08/04/2013   Procedure: LYMPHADENECTOMY;  Surgeon: Fred [name];  Location: [facility];  Service: Urology;  Laterality: Bilateral;  . PROSTATE SURGERY    . ROBOT ASSISTED LAPAROSCOPIC RADICAL PROSTATECTOMY N/A 08/04/2013  Procedure: ROBOTIC ASSISTED LAPAROSCOPIC RADICAL PROSTATECTOMY LEVEL 2;  Surgeon: Fred [name];  Location: [facility];  Service: Urology;  Laterality: N/A;   Past Surgical History:  Procedure Laterality Date  . ANTERIOR CERVICAL DECOMP/DISCECTOMY FUSION  09-19-2010    dr Ronnald [name]  @[facility]   . COLONOSCOPY W/ POLYPECTOMY  last one 03/ 2019  . CYSTOSCOPY N/A 05/17/2018   Procedure: CYSTOSCOPY/ REMOVAL OF BLADDER CALCULUS;  Surgeon: Fred [name];  Location: [facility];  Service: Urology;  Laterality: N/A;  ONLY NEEDS 45 MIN  . FINGER SURGERY  1970s   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 RIGHT MIDDLE FINGER  . IMPLANTATION BONE ANCHORED HEARING AID Right 2011    @[facility]    "no longer have this"  . LYMPHADENECTOMY Bilateral 08/04/2013    Procedure: LYMPHADENECTOMY;  Surgeon: Fred [name];  Location: [facility];  Service: Urology;  Laterality: Bilateral;  . PROSTATE SURGERY    . ROBOT ASSISTED LAPAROSCOPIC RADICAL PROSTATECTOMY N/A 08/04/2013   Procedure: ROBOTIC ASSISTED LAPAROSCOPIC RADICAL PROSTATECTOMY LEVEL 2;  Surgeon: Fred [name];  Location: [facility];  Service: Urology;  Laterality: N/A;   Past Medical History:  Diagnosis Date  . Anticoagulated    xarelto  . Benign essential tremor 10/12/2019  . Depression   . Gait abnormality    MILD --- DOES NOT USE CANE  . 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 History of kidney stones   . 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 Hyperlipidemia   . Hypertension   . 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 Nocturia    twice  . Prostate cancer ([place])    UROLOGIST-- DR Alinda [name]---  Stage T2a, Gleason 4+3, PSA 5.4,  s/p  radical prostatectomy 2015;   05-14-2018  per pt PSA undetectable  . Pulmonary embolism, bilateral ([place]) 12/02/2017   currently taking xarelto  . Wears dentures    upper  . Wears glasses   . Wears hearing aid in both ears    There were no vitals taken for this visit.  Opioid Risk Score:   Fall Risk Score:  `1  Depression screen PHQ 2/9  Depression screen [facility] 2/9 02/02/2020 12/30/2019 12/02/2019 11/07/2019 10/22/2018 08/23/2018 04/05/2018  Decreased Interest 0 0 0 0 0 0 1  Down, Depressed, Hopeless 0 0 0 0 1 1 1   PHQ - 2 Score 0 0 0 0 1 1 2   Altered sleeping - - - 0 - - -  Tired, decreased energy - - - 0 - - -  Change in appetite - - - 0 - - -  Feeling bad or failure about yourself  - - - 0 - - -  Trouble concentrating - - - 0 - - -  Moving slowly or fidgety/restless - - - 0 - - -  Suicidal thoughts - - - 0 - - -  PHQ-9 Score - - - 0 - - -   Review of Systems   Musculoskeletal: Positive for back pain, gait problem and neck pain.  All other systems reviewed and are negative.      Objective:   Physical Exam Vitals and nursing note reviewed.  Constitutional:      Appearance: Normal appearance.  Neck:  Comments: Cervical Paraspinal Tenderness: C-5-C-6 Cardiovascular:     Rate and Rhythm: Normal rate and regular rhythm.     Pulses: Normal pulses.     Heart sounds: Normal heart sounds.  Pulmonary:     Effort: Pulmonary effort is normal.     Breath sounds: Normal breath sounds.  Musculoskeletal:     Cervical back: Normal range of motion and neck supple.     Comments: Normal Muscle Bulk and Muscle Testing Reveals:  Upper Extremities: Full ROM and Muscle Strength 5/5 Bilateral AC Joint Tenderness Lumbar Paraspinal Tenderness: L-3-L-5  Lower Extremities: Full ROM and Muscle Strength 5/5 Arises from chair with Ease Narrow Based Gait   Skin:    General: Skin is warm and dry.  Neurological:     Mental Status: He is alert and oriented to person, place, and time.  Psychiatric:        Mood and Affect: Mood normal.        Behavior: Behavior normal.           Assessment & Plan:  1.Cervical Postlaminectomy: Continue HEP and Continue current medication regime.07/04/2020. 2. Cervicalgia/ Cervical Radiculitis: Continue current medication regime. Continue to Monitor.07/04/2020 3. Chronic BilateralShoulder Pain:Continue HEP as Tolerated.Continue to monitor.07/04/2020 4. L2 compression fracture/ Chronic Bilateral Low Back Pain without Sciatica: Continue to Monitor:07/04/2020 Refilled:Oxycodone 5 mg one tablet every 6hours as needed for moderate pain. #120.We will continue the opioid monitoring program, this consists of regular clinic visits, examinations, urine drug screen, pill counts as well as use of [place] Controlled Substance Reporting system. A 12 month History has been reviewed on the [place] Controlled Substance  Reporting Systemon02/16/2022. 5. Polyarthralgia:No complaints today.Continue to Monitor.02/16//2022 6. Chronic Bilateral Thoracic Pain:Continue HEP as Tolerated. Continue Current medication regimen. Continue to Monitor.07/04/2020  F/U in 1 month

## 2020-07-16 DIAGNOSIS — I129 Hypertensive chronic kidney disease with stage 1 through stage 4 chronic kidney disease, or unspecified chronic kidney disease: Secondary | ICD-10-CM | POA: Diagnosis not present

## 2020-07-16 DIAGNOSIS — E782 Mixed hyperlipidemia: Secondary | ICD-10-CM | POA: Diagnosis not present

## 2020-07-16 DIAGNOSIS — I10 Essential (primary) hypertension: Secondary | ICD-10-CM | POA: Diagnosis not present

## 2020-07-16 DIAGNOSIS — N183 Chronic kidney disease, stage 3 unspecified: Secondary | ICD-10-CM | POA: Diagnosis not present

## 2020-07-16 DIAGNOSIS — G300 Alzheimer's disease with early onset: Secondary | ICD-10-CM | POA: Diagnosis not present

## 2020-07-23 DIAGNOSIS — L281 Prurigo nodularis: Secondary | ICD-10-CM | POA: Diagnosis not present

## 2020-07-23 DIAGNOSIS — L821 Other seborrheic keratosis: Secondary | ICD-10-CM | POA: Diagnosis not present

## 2020-07-23 DIAGNOSIS — L814 Other melanin hyperpigmentation: Secondary | ICD-10-CM | POA: Diagnosis not present

## 2020-08-01 ENCOUNTER — Other Ambulatory Visit: Payer: Self-pay

## 2020-08-01 ENCOUNTER — Encounter: Payer: Medicare Other | Attending: Registered Nurse | Admitting: Registered Nurse

## 2020-08-01 ENCOUNTER — Encounter: Payer: Self-pay | Admitting: Registered Nurse

## 2020-08-01 VITALS — BP 114/73 | HR 70 | Temp 98.3°F | Ht 68.0 in | Wt 178.8 lb

## 2020-08-01 DIAGNOSIS — M542 Cervicalgia: Secondary | ICD-10-CM | POA: Diagnosis not present

## 2020-08-01 DIAGNOSIS — Z5181 Encounter for therapeutic drug level monitoring: Secondary | ICD-10-CM | POA: Insufficient documentation

## 2020-08-01 DIAGNOSIS — M961 Postlaminectomy syndrome, not elsewhere classified: Secondary | ICD-10-CM | POA: Diagnosis not present

## 2020-08-01 DIAGNOSIS — G8929 Other chronic pain: Secondary | ICD-10-CM | POA: Diagnosis not present

## 2020-08-01 DIAGNOSIS — Z79891 Long term (current) use of opiate analgesic: Secondary | ICD-10-CM | POA: Diagnosis not present

## 2020-08-01 DIAGNOSIS — G894 Chronic pain syndrome: Secondary | ICD-10-CM | POA: Diagnosis not present

## 2020-08-01 DIAGNOSIS — M545 Low back pain, unspecified: Secondary | ICD-10-CM | POA: Diagnosis not present

## 2020-08-01 DIAGNOSIS — M546 Pain in thoracic spine: Secondary | ICD-10-CM | POA: Diagnosis not present

## 2020-08-01 MED ORDER — OXYCODONE HCL 5 MG PO TABS: 5.0000 mg | ORAL_TABLET | Freq: Four times a day (QID) | ORAL | 0 refills | Status: DC | PRN

## 2020-08-01 NOTE — Patient Instructions (Signed)
Mrs. Norton, Bivins scheduled the appointment for the following week, I will be on vacation in April. I sent his March and April prescription to the pharmacy, call with any questions you might have .

## 2020-08-01 NOTE — Progress Notes (Signed)
Subjective:    Patient ID: Fred [name], male    DOB: July 07, 1950, 70 y.o.   MRN: [id number]  HPI: Fred [name] is a 70 y.o. male who returns for follow up appointment for chronic pain and medication refill. He states his pain is located in his neck and mid- lower back pain. He rates his pain 6. His current exercise regime is walking and performing stretching exercises.  Fred [name] Morphine equivalent is 30.00 MME.  UDS ordered today.   Pain Inventory Average Pain 7 Pain Right Now 6 My pain is intermittent, sharp, stabbing, tingling and aching  In the last 24 hours, has pain interfered with the following? General activity 8 Relation with others 7 Enjoyment of life 7 What TIME of day is your pain at its worst? daytime, evening and night Sleep (in general) Fair  Pain is worse with: walking, bending, standing and some activites Pain improves with: medication and TENS Relief from Meds: 4  Family History  Problem Relation Age of Onset  . Stroke Mother   . Dementia Mother   . Heart disease Father   . Stroke Father        heat stroke  . Kidney disease Brother   . Dementia Brother    Social History   Socioeconomic History  . Marital status: Married    Spouse name: Not on file  . Number of children: 6  . Years of education: 12+  . Highest education level: Not on file  Occupational History  . Occupation: Retired  Tobacco Use  . Smoking status: Former Smoker    Packs/day: 1.00    Years: 30.00    Pack years: 30.00    Types: Cigarettes    Quit date: 05/25/2001    Years since quitting: 19.2  . Smokeless tobacco: Former Systems developer    Types: Chew, Snuff    Quit date: 09/22/2001  Vaping Use  . Vaping Use: Never used  Substance and Sexual Activity  . Alcohol use: Not Currently  . Drug use: No  . Sexual activity: Not on file  Other Topics Concern  . Not on file  Social History Narrative   Lives with wife   Caffeine use: Coffee daily (1-2 per day)   Some soda   Right  handed    Social Determinants of Health   Financial Resource Strain: Not on file  Food Insecurity: Not on file  Transportation Needs: Not on file  Physical Activity: Not on file  Stress: Not on file  Social Connections: Not on file   Past Surgical History:  Procedure Laterality Date  . ANTERIOR CERVICAL DECOMP/DISCECTOMY FUSION  09-19-2010    dr Ronnald Ramp  @[facility]   . COLONOSCOPY W/ POLYPECTOMY  last one 03/ 2019  . CYSTOSCOPY N/A 05/17/2018   Procedure: CYSTOSCOPY/ REMOVAL OF BLADDER CALCULUS;  Surgeon: [name], MD;  Location: [facility];  Service: Urology;  Laterality: N/A;  ONLY NEEDS 45 MIN  . FINGER SURGERY  1970s   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 RIGHT MIDDLE FINGER  . IMPLANTATION BONE ANCHORED HEARING AID Right 2011    @[facility]    "no longer have this"  . LYMPHADENECTOMY Bilateral 08/04/2013   Procedure: LYMPHADENECTOMY;  Surgeon: Dutch Gray, MD;  Location: [facility];  Service: Urology;  Laterality: Bilateral;  . PROSTATE SURGERY    . ROBOT ASSISTED LAPAROSCOPIC RADICAL PROSTATECTOMY N/A 08/04/2013   Procedure: ROBOTIC ASSISTED LAPAROSCOPIC RADICAL PROSTATECTOMY LEVEL 2;  Surgeon: Dutch Gray, MD;  Location[facility];  Service: Urology;  Laterality: N/A;   Past Surgical History:  Procedure Laterality Date  . ANTERIOR CERVICAL DECOMP/DISCECTOMY FUSION  09-19-2010    dr Ronnald Ramp  @[facility]   . COLONOSCOPY W/ POLYPECTOMY  last one 03/ 2019  . CYSTOSCOPY N/A 05/17/2018   Procedure: CYSTOSCOPY/ REMOVAL OF BLADDER CALCULUS;  Surgeon: [name], MD;  Location[facility];  Service: Urology;  Laterality: N/A;  ONLY NEEDS 45 MIN  . FINGER SURGERY  1970s   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 RIGHT MIDDLE FINGER  . IMPLANTATION BONE ANCHORED HEARING AID Right 2011    @[facility]    "no longer have this"  . LYMPHADENECTOMY Bilateral 08/04/2013   Procedure: LYMPHADENECTOMY;  Surgeon: Dutch Gray, MD;  Location: [facility];  Service: Urology;  Laterality:  Bilateral;  . PROSTATE SURGERY    . ROBOT ASSISTED LAPAROSCOPIC RADICAL PROSTATECTOMY N/A 08/04/2013   Procedure: ROBOTIC ASSISTED LAPAROSCOPIC RADICAL PROSTATECTOMY LEVEL 2;  Surgeon: Dutch Gray, MD;  [facility];  Service: Urology;  Laterality: N/A;   Past Medical History:  Diagnosis Date  . Anticoagulated    xarelto  . Benign essential tremor 10/12/2019  . Depression   . Gait abnormality    MILD --- DOES NOT USE CANE  . 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 History of kidney stones   . 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 Hyperlipidemia   . Hypertension   . 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 Nocturia    twice  . Prostate cancer ([place])    UROLOGIST-- DR Fred [name]---  Stage T2a, Gleason 4+3, PSA 5.4,  s/p  radical prostatectomy 2015;   05-14-2018  per pt PSA undetectable  . Pulmonary embolism, bilateral ([place]) 12/02/2017   currently taking xarelto  . Wears dentures    upper  . Wears glasses   . Wears hearing aid in both ears    BP 114/73   Pulse 70   Temp 98.3 F (36.8 C)   Ht 5\' 8"  (1.727 m)   Wt 178 lb 12.8 oz (81.1 kg)   SpO2 96%   BMI 27.19 kg/m   Opioid Risk Score:   Fall Risk Score:  `1  Depression screen PHQ 2/9  Depression screen [facility] 2/9 07/04/2020 02/02/2020 12/30/2019 12/02/2019 11/07/2019 10/22/2018 08/23/2018  Decreased Interest 0 0 0 0 0 0 0  Down, Depressed, Hopeless 0 0 0 0 0 1 1  PHQ - 2 Score 0 0 0 0 0 1 1  Altered sleeping - - - - 0 - -  Tired, decreased energy - - - - 0 - -  Change in appetite - - - - 0 - -  Feeling bad or failure about yourself  - - - - 0 - -  Trouble concentrating - - - - 0 - -  Moving slowly or fidgety/restless - - - - 0 - -  Suicidal thoughts - - - - 0 - -  PHQ-9 Score - - - - 0 - -    Review of Systems   Musculoskeletal: Positive for back pain, gait problem and neck pain.  All other systems reviewed and are negative.      Objective:   Physical Exam Vitals and nursing note reviewed.  Constitutional:      Appearance: Normal appearance.  Neck:     Comments: Cervical Paraspinal Tenderness: C-5-C-6 Cardiovascular:     Rate and Rhythm: Normal rate and regular rhythm.     Pulses: Normal pulses.     Heart sounds: Normal heart sounds.  Pulmonary:     Effort: Pulmonary effort is normal.     Breath sounds: Normal breath sounds.  Musculoskeletal:     Cervical back: Normal range of motion and neck supple.     Comments: Normal Muscle Bulk and Muscle Testing Reveals:  Upper Extremities: Full ROM and Muscle Strength 5/5 Thoracic Paraspinal Tenderness: T-7-T-9 Lumbar Paraspinal Tenderness: L-3-L-5 Lower Extremities: Full ROM and Muscle Strength 5/5 Arises from chair with ease Narrow Based  Gait   Skin:    General: Skin is warm and dry.  Neurological:     Mental Status: He is alert and oriented to person, place, and time.  Psychiatric:        Mood and Affect: Mood normal.        Behavior: Behavior normal.           Assessment & Plan:  1.Cervical Postlaminectomy: Continue HEP and Continue current medication regime.08/01/2020. 2. Cervicalgia/ Cervical Radiculitis: Continue current medication regime. Continue to Monitor.08/01/2020 3. Chronic BilateralShoulder Pain:Continue HEP as Tolerated.Continue to monitor.08/01/2020 4. L2 compression fracture/ Chronic Bilateral Low Back Pain without Sciatica: Continue to Monitor:08/01/2020 Refilled:Oxycodone 5 mg one tablet every 6hours as needed for moderate pain. #120. Second script sent to accommodate scheduled appointment.  We will continue the opioid monitoring program, this consists of regular clinic visits, examinations, urine drug screen, pill counts as well as use of [place] Controlled Substance Reporting system. A 12 month  History has been reviewed on the [place] Controlled Substance Reporting Systemon03/16/2022. 5. Polyarthralgia:No complaints today.Continue to Monitor.03/16//2022 6. Chronic Bilateral Thoracic Pain:Continue HEP as Tolerated. Continue Current medication regimen. Continue to Monitor.08/01/2020  F/U in 1 month

## 2020-08-08 LAB — TOXASSURE SELECT,+ANTIDEPR,UR

## 2020-08-09 ENCOUNTER — Telehealth: Payer: Self-pay | Admitting: *Deleted

## 2020-08-09 NOTE — Telephone Encounter (Signed)
Urine drug screen for this encounter is consistent for prescribed medication 

## 2020-08-27 DIAGNOSIS — E782 Mixed hyperlipidemia: Secondary | ICD-10-CM | POA: Diagnosis not present

## 2020-08-27 DIAGNOSIS — I10 Essential (primary) hypertension: Secondary | ICD-10-CM | POA: Diagnosis not present

## 2020-08-27 DIAGNOSIS — G300 Alzheimer's disease with early onset: Secondary | ICD-10-CM | POA: Diagnosis not present

## 2020-08-27 DIAGNOSIS — M545 Low back pain, unspecified: Secondary | ICD-10-CM | POA: Diagnosis not present

## 2020-08-27 DIAGNOSIS — N183 Chronic kidney disease, stage 3 unspecified: Secondary | ICD-10-CM | POA: Diagnosis not present

## 2020-08-27 DIAGNOSIS — R251 Tremor, unspecified: Secondary | ICD-10-CM | POA: Diagnosis not present

## 2020-08-27 DIAGNOSIS — G9389 Other specified disorders of brain: Secondary | ICD-10-CM | POA: Diagnosis not present

## 2020-08-27 DIAGNOSIS — Z0000 Encounter for general adult medical examination without abnormal findings: Secondary | ICD-10-CM | POA: Diagnosis not present

## 2020-08-27 DIAGNOSIS — Z1211 Encounter for screening for malignant neoplasm of colon: Secondary | ICD-10-CM | POA: Diagnosis not present

## 2020-08-27 DIAGNOSIS — I129 Hypertensive chronic kidney disease with stage 1 through stage 4 chronic kidney disease, or unspecified chronic kidney disease: Secondary | ICD-10-CM | POA: Diagnosis not present

## 2020-08-27 DIAGNOSIS — N1831 Chronic kidney disease, stage 3a: Secondary | ICD-10-CM | POA: Diagnosis not present

## 2020-09-06 ENCOUNTER — Ambulatory Visit: Payer: Medicare Other | Admitting: Registered Nurse

## 2020-09-10 ENCOUNTER — Encounter: Payer: Self-pay | Admitting: Registered Nurse

## 2020-09-10 ENCOUNTER — Other Ambulatory Visit: Payer: Self-pay

## 2020-09-10 ENCOUNTER — Encounter: Payer: Medicare Other | Attending: Registered Nurse | Admitting: Registered Nurse

## 2020-09-10 VITALS — BP 97/64 | HR 61 | Temp 97.7°F | Ht 68.0 in | Wt 173.0 lb

## 2020-09-10 DIAGNOSIS — Z5181 Encounter for therapeutic drug level monitoring: Secondary | ICD-10-CM | POA: Insufficient documentation

## 2020-09-10 DIAGNOSIS — G8929 Other chronic pain: Secondary | ICD-10-CM | POA: Diagnosis not present

## 2020-09-10 DIAGNOSIS — Z79891 Long term (current) use of opiate analgesic: Secondary | ICD-10-CM | POA: Diagnosis not present

## 2020-09-10 DIAGNOSIS — M542 Cervicalgia: Secondary | ICD-10-CM | POA: Diagnosis not present

## 2020-09-10 DIAGNOSIS — M79674 Pain in right toe(s): Secondary | ICD-10-CM | POA: Insufficient documentation

## 2020-09-10 DIAGNOSIS — M961 Postlaminectomy syndrome, not elsewhere classified: Secondary | ICD-10-CM | POA: Diagnosis not present

## 2020-09-10 DIAGNOSIS — M545 Low back pain, unspecified: Secondary | ICD-10-CM | POA: Insufficient documentation

## 2020-09-10 DIAGNOSIS — M546 Pain in thoracic spine: Secondary | ICD-10-CM | POA: Diagnosis not present

## 2020-09-10 DIAGNOSIS — G894 Chronic pain syndrome: Secondary | ICD-10-CM | POA: Insufficient documentation

## 2020-09-10 MED ORDER — OXYCODONE HCL 5 MG PO TABS: 5.0000 mg | ORAL_TABLET | Freq: Four times a day (QID) | ORAL | 0 refills | Status: DC | PRN

## 2020-09-10 NOTE — Progress Notes (Signed)
Subjective:    Patient ID: Fred [name], male    DOB: [birth year], 70 y.o.   MRN: [id number]  HPI: Fred [name] is a 70 y.o. male who returns for follow up appointment for chronic pain and medication refill. He states his pain is located in his neck, mid- lower back and right great toe pain. He states he was trimming the nail on his right great toe,  And states he has pain with his enclosed shoe on. He denied this provider to assess his toe. Mrs. Espiritu stated no infection noted, he was instructed to have a podiatrist cut his nails, they verbalize understanding. He rates his pain 5. His current exercise regime is walking and performing stretching exercises.  Fred [name] Morphine equivalent is 30.00  MME.    Last UDS was Performed on 08/01/2020, it was consistent.   Pain Inventory Average Pain 6 Pain Right Now 5 My pain is intermittent, sharp, stabbing, tingling and aching  In the last 24 hours, has pain interfered with the following? General activity 4 Relation with others 3 Enjoyment of life 3 What TIME of day is your pain at its worst? daytime and evening Sleep (in general) Fair  Pain is worse with: walking, bending, standing and some activites Pain improves with: medication and TENS Relief from Meds: 3  Family History  Problem Relation Age of Onset  . Stroke Mother   . Dementia Mother   . Heart disease Father   . Stroke Father        heat stroke  . Kidney disease Brother   . Dementia Brother    Social History   Socioeconomic History  . Marital status: Married    Spouse name: Not on file  . Number of children: 6  . Years of education: 12+  . Highest education level: Not on file  Occupational History  . Occupation: Retired  Tobacco Use  . Smoking status: Former Smoker    Packs/day: 1.00    Years: 30.00    Pack years: 30.00    Types: Cigarettes    Quit date: 05/25/2001    Years since quitting: 19.3  . Smokeless tobacco: Former Systems developer    Types: Chew,  Snuff    Quit date: 09/22/2001  Vaping Use  . Vaping Use: Never used  Substance and Sexual Activity  . Alcohol use: Not Currently  . Drug use: No  . Sexual activity: Not on file  Other Topics Concern  . Not on file  Social History Narrative   Lives with wife   Caffeine use: Coffee daily (1-2 per day)   Some soda   Right handed    Social Determinants of Health   Financial Resource Strain: Not on file  Food Insecurity: Not on file  Transportation Needs: Not on file  Physical Activity: Not on file  Stress: Not on file  Social Connections: Not on file   Past Surgical History:  Procedure Laterality Date  . ANTERIOR CERVICAL DECOMP/DISCECTOMY FUSION  09-19-2010    dr Ronnald Ramp  @[facility]   . COLONOSCOPY W/ POLYPECTOMY  last one 03/ 2019  . CYSTOSCOPY N/A 05/17/2018   Procedure: CYSTOSCOPY/ REMOVAL OF BLADDER CALCULUS;  Surgeon: [name], MD;  Location: [facility];  Service: Urology;  Laterality: N/A;  ONLY NEEDS 45 MIN  . FINGER SURGERY  1970s   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 RIGHT MIDDLE FINGER  . IMPLANTATION BONE ANCHORED HEARING AID Right 2011    @[facility]    "no  longer have this"  . LYMPHADENECTOMY Bilateral 08/04/2013   Procedure: LYMPHADENECTOMY;  Surgeon: Dutch Gray, MD;  Location: [facility];  Service: Urology;  Laterality: Bilateral;  . PROSTATE SURGERY    . ROBOT ASSISTED LAPAROSCOPIC RADICAL PROSTATECTOMY N/A 08/04/2013   Procedure: ROBOTIC ASSISTED LAPAROSCOPIC RADICAL PROSTATECTOMY LEVEL 2;  Surgeon: Dutch Gray, MD;  Location: [facility];  Service: Urology;  Laterality: N/A;   Past Surgical History:  Procedure Laterality Date  . ANTERIOR CERVICAL DECOMP/DISCECTOMY FUSION  09-19-2010    dr Ronnald Ramp  @[facility]   . COLONOSCOPY W/ POLYPECTOMY  last one 03/ 2019  . CYSTOSCOPY N/A 05/17/2018   Procedure: CYSTOSCOPY/ REMOVAL OF BLADDER CALCULUS;  Surgeon: [name], MD;  Location: [facility];  Service: Urology;  Laterality: N/A;  ONLY NEEDS 45 MIN  . FINGER SURGERY   1970s   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 RIGHT MIDDLE FINGER  . IMPLANTATION BONE ANCHORED HEARING AID Right 2011    @[facility]    "no longer have this"  . LYMPHADENECTOMY Bilateral 08/04/2013   Procedure: LYMPHADENECTOMY;  Surgeon: Dutch Gray, MD;  [facility]: [facility];  Service: Urology;  Laterality: Bilateral;  . PROSTATE SURGERY    . ROBOT ASSISTED LAPAROSCOPIC RADICAL PROSTATECTOMY N/A 08/04/2013   Procedure: ROBOTIC ASSISTED LAPAROSCOPIC RADICAL PROSTATECTOMY LEVEL 2;  Surgeon: Dutch Gray, MD;  Location: [facility];  Service: Urology;  Laterality: N/A;   Past Medical History:  Diagnosis Date  . Anticoagulated    xarelto  . Benign essential tremor 10/12/2019  . Depression   . Gait abnormality    MILD --- DOES NOT USE CANE  . 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 History of kidney stones   . 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 Hyperlipidemia   . Hypertension   . 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 Nocturia    twice  . Prostate cancer ([place])    UROLOGIST-- DR [name]---  Stage T2a, Gleason 4+3, PSA 5.4,  s/p  radical prostatectomy 2015;   05-14-2018  per pt PSA undetectable  . Pulmonary embolism, bilateral ([place]) 12/02/2017   currently taking xarelto  . Wears dentures    upper  . Wears glasses   . Wears hearing aid in both ears    BP 97/64   Pulse 61   Temp 97.7 F (36.5 C)   Ht 5\' 8"  (1.727 m)   Wt 173 lb (78.5 kg)   SpO2 95%   BMI 26.30 kg/m   Opioid Risk Score:   Fall Risk Score:  `1  Depression screen PHQ 2/9  Depression screen [facility] 2/9 07/04/2020 02/02/2020 12/30/2019 12/02/2019 11/07/2019 10/22/2018 08/23/2018  Decreased Interest 0 0 0 0 0 0 0  Down, Depressed, Hopeless 0 0 0 0 0 1 1  PHQ - 2 Score 0 0 0 0 0 1 1  Altered sleeping - - - - 0 - -   Tired, decreased energy - - - - 0 - -  Change in appetite - - - - 0 - -  Feeling bad or failure about yourself  - - - - 0 - -  Trouble concentrating - - - - 0 - -  Moving slowly or fidgety/restless - - - - 0 - -  Suicidal thoughts - - - - 0 - -  PHQ-9 Score - - - - 0 - -    Review of Systems  Musculoskeletal: Positive for back pain, gait problem and neck pain.  All other systems reviewed and are negative.      Objective:   Physical Exam Vitals and nursing note reviewed.  Constitutional:      Appearance: Normal appearance.  Cardiovascular:     Rate and Rhythm: Normal rate and regular rhythm.     Pulses: Normal pulses.     Heart sounds: Normal heart sounds.  Pulmonary:     Effort: Pulmonary effort is normal.     Breath sounds: Normal breath sounds.  Musculoskeletal:     Cervical back: Normal range of motion and neck supple.     Comments: Normal Muscle Bulk and Muscle Testing Reveals:  Upper Extremities: Full ROM and Muscle Strength 5/5 Lower Extremities: Full ROM and Muscle Strength 5/5 Arises from Table with ease Narrow Based  Gait   Skin:    General: Skin is warm and dry.  Neurological:     Mental Status: He is alert and oriented to person, place, and time.  Psychiatric:        Mood and Affect: Mood normal.        Behavior: Behavior normal.           Assessment & Plan:  1.Cervical Postlaminectomy: Continue HEP and Continue current medication regime.09/10/2020. 2. Cervicalgia/ Cervical Radiculitis: Continue current medication regime. Continue to Monitor.09/10/2020 3. Chronic BilateralShoulder Pain:No complaints today. Continue HEP as Tolerated.Continue to monitor.09/10/2020 4. L2 compression fracture/ Chronic Bilateral Low Back Pain without Sciatica: Continue to Monitor:09/10/2020 Refilled:Oxycodone 5 mg one tablet every 6hours as needed for moderate pain. #120.Marland Kitchen  We will continue the opioid monitoring program, this consists of regular clinic visits,  examinations, urine drug screen, pill counts as well as use of [place] Controlled Substance Reporting system. A 12 month History has been reviewed on the [place] Controlled Substance Reporting Systemon04/25/2022. 5. Polyarthralgia:No complaints today.Continue to Monitor.04/25//2022 6. Chronic Bilateral Thoracic Pain:Continue HEP as Tolerated. Continue Current medication regimen. Continue to Monitor.09/10/2020  F/U in 1 month

## 2020-10-09 ENCOUNTER — Other Ambulatory Visit: Payer: Self-pay

## 2020-10-09 ENCOUNTER — Encounter: Payer: Self-pay | Admitting: Registered Nurse

## 2020-10-09 ENCOUNTER — Encounter: Payer: Medicare Other | Attending: Registered Nurse | Admitting: Registered Nurse

## 2020-10-09 VITALS — BP 125/81 | HR 70 | Temp 98.2°F | Ht 68.0 in | Wt 176.0 lb

## 2020-10-09 DIAGNOSIS — M542 Cervicalgia: Secondary | ICD-10-CM | POA: Diagnosis not present

## 2020-10-09 DIAGNOSIS — G894 Chronic pain syndrome: Secondary | ICD-10-CM | POA: Diagnosis not present

## 2020-10-09 DIAGNOSIS — Z5181 Encounter for therapeutic drug level monitoring: Secondary | ICD-10-CM | POA: Insufficient documentation

## 2020-10-09 DIAGNOSIS — Z79891 Long term (current) use of opiate analgesic: Secondary | ICD-10-CM | POA: Insufficient documentation

## 2020-10-09 DIAGNOSIS — G8929 Other chronic pain: Secondary | ICD-10-CM | POA: Insufficient documentation

## 2020-10-09 DIAGNOSIS — M5412 Radiculopathy, cervical region: Secondary | ICD-10-CM | POA: Diagnosis not present

## 2020-10-09 DIAGNOSIS — M961 Postlaminectomy syndrome, not elsewhere classified: Secondary | ICD-10-CM | POA: Diagnosis not present

## 2020-10-09 DIAGNOSIS — M545 Low back pain, unspecified: Secondary | ICD-10-CM | POA: Diagnosis not present

## 2020-10-09 MED ORDER — OXYCODONE HCL 5 MG PO TABS: 5.0000 mg | ORAL_TABLET | Freq: Four times a day (QID) | ORAL | 0 refills | Status: DC | PRN

## 2020-10-09 NOTE — Progress Notes (Signed)
Subjective:    Patient ID: Fred [name], male    DOB: Sep 29, 1950, 70 y.o.   MRN: [id number]  HPI: Fred [name] is a 70 y.o. male who returns for follow up appointment for chronic pain and medication refill. He states his pain is located in his neck radiating into his bilateral shoulders and lower back pain. He  rates his pain 4. His current exercise regime is walking and performing stretching exercises.  Mr. Strother Morphine equivalent is  30.00 MME.  Last UDS was Performed on 08/01/2020, it was consistent.    Pain Inventory Average Pain 5 Pain Right Now 4 My pain is intermittent  In the last 24 hours, has pain interfered with the following? General activity 5 Relation with others 6 Enjoyment of life 6 What TIME of day is your pain at its worst? daytime, evening and night Sleep (in general) Fair  Pain is worse with: walking, bending and standing Pain improves with: rest and medication Relief from Meds: NOT SURE  Family History  Problem Relation Age of Onset  . Stroke Mother   . Dementia Mother   . Heart disease Father   . Stroke Father        heat stroke  . Kidney disease Brother   . Dementia Brother    Social History   Socioeconomic History  . Marital status: Married    Spouse name: Not on file  . Number of children: 6  . Years of education: 12+  . Highest education level: Not on file  Occupational History  . Occupation: Retired  Tobacco Use  . Smoking status: Former Smoker    Packs/day: 1.00    Years: 30.00    Pack years: 30.00    Types: Cigarettes    Quit date: 05/25/2001    Years since quitting: 19.3  . Smokeless tobacco: Former Systems developer    Types: Chew, Snuff    Quit date: 09/22/2001  Vaping Use  . Vaping Use: Never used  Substance and Sexual Activity  . Alcohol use: Not Currently  . Drug use: No  . Sexual activity: Not on file  Other Topics Concern  . Not on file  Social History Narrative   Lives with wife   Caffeine use: Coffee daily (1-2 per  day)   Some soda   Right handed    Social Determinants of Health   Financial Resource Strain: Not on file  Food Insecurity: Not on file  Transportation Needs: Not on file  Physical Activity: Not on file  Stress: Not on file  Social Connections: Not on file   Past Surgical History:  Procedure Laterality Date  . ANTERIOR CERVICAL DECOMP/DISCECTOMY FUSION  09-19-2010    dr Ronnald Ramp  @[facility]   . COLONOSCOPY W/ POLYPECTOMY  last one 03/ 2019  . CYSTOSCOPY N/A 05/17/2018   Procedure: CYSTOSCOPY/ REMOVAL OF BLADDER CALCULUS;  Surgeon: [name], MD;  [facility]: [facility];  Service: Urology;  Laterality: N/A;  ONLY NEEDS 45 MIN  . FINGER SURGERY  1970s   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 RIGHT MIDDLE FINGER  . IMPLANTATION BONE ANCHORED HEARING AID Right 2011    @[facility]    "no longer have this"  . LYMPHADENECTOMY Bilateral 08/04/2013   Procedure: LYMPHADENECTOMY;  Surgeon: Dutch Gray, MD;  [facility];  Service: Urology;  Laterality: Bilateral;  . PROSTATE SURGERY    . ROBOT ASSISTED LAPAROSCOPIC RADICAL PROSTATECTOMY N/A 08/04/2013   Procedure: ROBOTIC ASSISTED LAPAROSCOPIC RADICAL PROSTATECTOMY LEVEL 2;  Surgeon:  Dutch Gray, MD;  Location[facility];  Service: Urology;  Laterality: N/A;   Past Surgical History:  Procedure Laterality Date  . ANTERIOR CERVICAL DECOMP/DISCECTOMY FUSION  09-19-2010    dr Ronnald Ramp  @[facility]   . COLONOSCOPY W/ POLYPECTOMY  last one 03/ 2019  . CYSTOSCOPY N/A 05/17/2018   Procedure: CYSTOSCOPY/ REMOVAL OF BLADDER CALCULUS;  Surgeon: [name], MD;  [facility];  Service: Urology;  Laterality: N/A;  ONLY NEEDS 45 MIN  . FINGER SURGERY  1970s   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 RIGHT MIDDLE FINGER  . IMPLANTATION BONE ANCHORED HEARING AID Right 2011    @[facility]    "no longer have this"  . LYMPHADENECTOMY Bilateral 08/04/2013   Procedure: LYMPHADENECTOMY;  Surgeon: Dutch Gray, MD;  [facility];  Service:  Urology;  Laterality: Bilateral;  . PROSTATE SURGERY    . ROBOT ASSISTED LAPAROSCOPIC RADICAL PROSTATECTOMY N/A 08/04/2013   Procedure: ROBOTIC ASSISTED LAPAROSCOPIC RADICAL PROSTATECTOMY LEVEL 2;  Surgeon: Dutch Gray, MD;  [facility];  Service: Urology;  Laterality: N/A;   Past Medical History:  Diagnosis Date  . Anticoagulated    xarelto  . Benign essential tremor 10/12/2019  . Depression   . Gait abnormality    MILD --- DOES NOT USE CANE  . 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 History of kidney stones   . 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 Hyperlipidemia   . Hypertension   . 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 Nocturia    twice  . Prostate cancer ([place])    UROLOGIST-- DR [name]---  Stage T2a, Gleason 4+3, PSA 5.4,  s/p  radical prostatectomy 2015;   05-14-2018  per pt PSA undetectable  . Pulmonary embolism, bilateral ([place]) 12/02/2017   currently taking xarelto  . Wears dentures    upper  . Wears glasses   . Wears hearing aid in both ears    BP 125/81   Pulse 70   Temp 98.2 F (36.8 C)   Ht 5\' 8"  (1.727 m)   Wt 176 lb (79.8 kg)   SpO2 96%   BMI 26.76 kg/m   Opioid Risk Score:   Fall Risk Score:  `1  Depression screen PHQ 2/9  Depression screen [facility] 2/9 07/04/2020 02/02/2020 12/30/2019 12/02/2019 11/07/2019 10/22/2018 08/23/2018  Decreased Interest 0 0 0 0 0 0 0  Down, Depressed, Hopeless 0 0 0 0 0 1 1  PHQ - 2 Score 0 0 0 0 0 1 1  Altered sleeping - - - - 0 - -  Tired, decreased energy - - - - 0 - -  Change in appetite - - - - 0 - -  Feeling bad or failure about yourself  - - - - 0 - -  Trouble concentrating - - - - 0 - -  Moving slowly or fidgety/restless - - - - 0 - -  Suicidal thoughts - - - - 0 - -  PHQ-9 Score - - - - 0 - -      Review of  Systems  Constitutional: Negative.   HENT: Negative.   Eyes: Negative.   Respiratory: Negative.   Cardiovascular: Negative.   Gastrointestinal: Negative.   Endocrine: Negative.  Genitourinary: Negative.   Musculoskeletal: Positive for back pain and neck pain.       LOWER BACK  Skin: Negative.   Allergic/Immunologic: Negative.   Neurological: Negative.   Hematological: Negative.   Psychiatric/Behavioral: Negative.        Objective:   Physical Exam Vitals and nursing note reviewed.  Constitutional:      Appearance: Normal appearance.  Neck:     Comments: Cervical Paraspinal Tenderness: C-5-C-6 Cardiovascular:     Rate and Rhythm: Normal rate and regular rhythm.     Pulses: Normal pulses.     Heart sounds: Normal heart sounds.  Pulmonary:     Effort: Pulmonary effort is normal.     Breath sounds: Normal breath sounds.  Musculoskeletal:     Cervical back: Normal range of motion and neck supple.     Comments: Normal Muscle Bulk and Muscle Testing Reveals:  Upper Extremities: Full ROM and Muscle Strength 5/5 Bilateral AC Joint Tenderness Lumbar Paraspinal Tenderness: L-3-L-5 Lower Extremities: Full ROM and Muscle Strength 5/5 Arises from Table with ease Narrow Based Gait   Skin:    General: Skin is warm and dry.  Neurological:     Mental Status: He is alert and oriented to person, place, and time.  Psychiatric:        Mood and Affect: Mood normal.        Behavior: Behavior normal.           Assessment & Plan:  1.Cervical Postlaminectomy: Continue HEP and Continue current medication regime.10/09/2020. 2. Cervicalgia/ Cervical Radiculitis: Continue current medication regime. Continue to Monitor.10/09/2020 3. Chronic BilateralShoulder Pain:No complaints today. Continue HEP as Tolerated.Continue to monitor.10/09/2020 4. L2 compression fracture/ Chronic Bilateral Low Back Pain without Sciatica: Continue to Monitor:10/09/2020 Refilled:Oxycodone 5 mg one  tablet every 6hours as needed for moderate pain. #120.Marland Kitchen We will continue the opioid monitoring program, this consists of regular clinic visits, examinations, urine drug screen, pill counts as well as use of [place] Controlled Substance Reporting system. A 12 month History has been reviewed on the [place] Controlled Substance Reporting Systemon05/24/2022. 5. Polyarthralgia:No complaints today.Continue to Monitor.05/24//2022 6. Chronic Bilateral Thoracic Pain:Continue HEP as Tolerated. Continue Current medication regimen. Continue to Monitor.10/09/2020  F/U in 1 month

## 2020-10-18 ENCOUNTER — Ambulatory Visit: Payer: Medicare Other | Admitting: Neurology

## 2020-10-29 NOTE — Progress Notes (Signed)
PATIENT: Fred [name] DOB: 05/02/1951  REASON FOR VISIT: follow up HISTORY FROM: patient  HISTORY OF PRESENT ILLNESS: Today 10/30/20 Fred [name] is a 70 year old male with history of tremor to both hands, memory disturbance. MMSE 23/30 today.  Remains on Aricept 10 mg, not sure if taking AM/PM, PCP just increased Namenda 10 mg daily.  Reports feeling less fatigued, takes propanolol twice weekly as needed for tremor.  Tremor is both hands, with action. With memory, may forget what he went into a room for.  His wife does most of the driving, he only does short distances.  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 He mows the lawn. Sees pain management for chronic back pain, on oxycodone.  No falls, he does walk with a stagger, but has been that way. Has dreams at night of coal mining.  Here today with his wife. They deny any concerns.   Update 04/18/2020 SS: Fred [name] is a 70 year old male with history of mild memory disturbance, noted tremor to both hands in early 2021.  Currently taking propanolol 40 mg as needed for tremor, Aricept 10 mg daily, Namenda 5 mg daily.  For the last 1 month, has noted fatigue, heart rate in the 40s.  Around that time, Namenda 5 mg was added, propanolol was switched to as needed.  1 week ago, amlodipine 5 mg daily was started.  His wife usually gives him propanolol 3-4 times a week when she notes tremor.  Tremors in both hands, greater in the right, most notable with eating.  Memory is overall stable.  Does little driving, mostly because he and his wife are always together, he navigates.  More still has benign forgetfulness.  Has been more sleepy, sleeping 14 hours a day.  Has noted a few headaches.  Is on oxycodone for chronic back pain.  Presents today for evaluation accompanied by his wife.  MMSE 25/30 today.  HISTORY  10/12/2019 Dr. Jannifer Franklin: Fred [name] is a 70 year old right-handed white male with a history of a mild  memory disturbance.  The patient was last seen through this office about 2 years ago, he has been maintained on Aricept to 10 mg at night.  He has tolerated the medication well.  Over the last 2 years he has had some slight increased problems with forgetfulness.  The is still able to operate a motor vehicle, he denies any difficulty with safety issues or with directions.  He indicates that his wife helps him out with keeping up with medications and appointments, she does the finances and always has.  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  He has a very strong family history of Alzheimer's disease with dementia affecting his mother and a brother and a sister.  Over the last 6 months he has noted some intermittent tremor that affects both hands equally.  The tremor is present with activity of the hands, not present at rest.  He denies any tremor affecting the jaw or the head or neck or any vocal tremor.  He is not having a lot of troubles feeding himself or performing handwriting.  The patient has had some issues with sleeping quite a bit during the day, he will go to bed and sleep 12 hours, sometimes up to 15 hours a day.  The patient has a good energy level once he is awake during the day and he  remains active.  He reports some bilateral foot numbness following a frostbite injury many years ago.  He does note some slight gait instability, he has not had any falls.  He reports no other focal numbness or weakness of the extremities.  He is sent to this office for further evaluation.   REVIEW OF SYSTEMS: Out of a complete 14 system review of symptoms, the patient complains only of the following symptoms, and all other reviewed systems are negative.  Tremor, memory loss  ALLERGIES: Allergies  Allergen Reactions   Nsaids     Other reaction(s): chronic kidney disease   Percodan [Oxycodone-Aspirin] Hives and Nausea And Vomiting    Per wife  " this was 40 yrs ago"  Approx.  1970s   Pregabalin Other (See Comments)    Falls asleep while talking to people Other reaction(s): significant drowsiness   Sulfa Antibiotics     Other reaction(s): Unknown    HOME MEDICATIONS: Outpatient Medications Prior to Visit  Medication Sig Dispense Refill   albuterol (PROAIR HFA) 108 (90 Base) MCG/ACT inhaler Inhale 1-2 puffs into the lungs every 6 (six) hours as needed for wheezing or shortness of breath. 3 Inhaler 1   amLODipine (NORVASC) 5 MG tablet Take 5 mg by mouth daily.     atorvastatin (LIPITOR) 20 MG tablet Take 20 mg by mouth daily.     atorvastatin (LIPITOR) 40 MG tablet Take 20 mg by mouth at bedtime.      donepezil (ARICEPT) 10 MG tablet Take 10 mg by mouth daily.     DULoxetine (CYMBALTA) 60 MG capsule Take 60 mg by mouth 2 (two) times daily.      lisinopril (ZESTRIL) 20 MG tablet Take 20 mg by mouth 2 (two) times daily.     memantine (NAMENDA) 5 MG tablet Take 5 mg by mouth at bedtime.     oxyCODONE (OXY IR/ROXICODONE) 5 MG immediate release tablet Take 1 tablet (5 mg total) by mouth every 6 (six) hours as needed for severe pain. 120 tablet 0   polyethylene glycol-electrolytes (NULYTELY) 420 g solution Take by mouth as directed.     propranolol (INDERAL) 40 MG tablet Take 40 mg by mouth as needed for tremors.     No facility-administered medications prior to visit.    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 ,OCCIPITAL SKULL FRACTURE-- RESIDUAL MILD MEMORY ISSUES AND RIGHT SIDE HEARING LOSS (PT FELL ON HEAD FROM 6 FT LADDER)   Hyperlipidemia    Hypertension    Mild memory disturbance    DUE TO TBI , 10-2008   (per pt  mild)   Nocturia    twice   Prostate cancer ([place])    UROLOGIST-- DR [name]---  Stage T2a, Gleason 4+3, PSA 5.4,  s/p  radical prostatectomy 2015;   05-14-2018  per pt PSA undetectable   Pulmonary embolism, bilateral ([place]) 12/02/2017   currently taking xarelto   Wears dentures    upper   Wears glasses    Wears hearing aid in both ears     PAST SURGICAL HISTORY: Past Surgical History:  Procedure Laterality Date  ANTERIOR CERVICAL DECOMP/DISCECTOMY FUSION  09-19-2010    dr Ronnald Ramp  @[facility]    COLONOSCOPY W/ POLYPECTOMY  last one 03/ 2019   CYSTOSCOPY N/A 05/17/2018   Procedure: CYSTOSCOPY/ REMOVAL OF BLADDER CALCULUS;  Surgeon: [name], MD;  Location: [facility];  Service: Urology;  Laterality: N/A;  ONLY NEEDS 45 MIN   FINGER SURGERY  1970s   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 RIGHT MIDDLE FINGER   IMPLANTATION BONE ANCHORED HEARING AID Right 2011    @[facility]    "no longer have this"   LYMPHADENECTOMY Bilateral 08/04/2013   Procedure: LYMPHADENECTOMY;  Surgeon: Dutch Gray, MD;  Location: [facility];  Service: Urology;  Laterality: Bilateral;   PROSTATE SURGERY     ROBOT ASSISTED LAPAROSCOPIC RADICAL PROSTATECTOMY N/A 08/04/2013   Procedure: ROBOTIC ASSISTED LAPAROSCOPIC RADICAL PROSTATECTOMY LEVEL 2;  Surgeon: Dutch Gray, MD;  [facility];  Service: Urology;  Laterality: N/A;    FAMILY HISTORY: Family History  Problem Relation Age of Onset   Stroke Mother    Dementia Mother    Heart disease Father    Stroke Father        heat stroke   Kidney disease Brother    Dementia Brother     SOCIAL HISTORY: Social History   Socioeconomic History   Marital status: Married    Spouse name: Not on file   Number of children: 6   Years of education: 12+   Highest education level: Not on file  Occupational History   Occupation: Retired  Tobacco Use   Smoking status: Former    Packs/day: 1.00    Years: 30.00    Pack years: 30.00    Types: Cigarettes    Quit  date: 05/25/2001    Years since quitting: 19.4   Smokeless tobacco: Former    Types: Chew, Snuff    Quit date: 09/22/2001  Vaping Use   Vaping Use: Never used  Substance and Sexual Activity   Alcohol use: Not Currently   Drug use: No   Sexual activity: Not on file  Other Topics Concern   Not on file  Social History Narrative   Lives with wife   Caffeine use: Coffee daily (1-2 per day)   Some soda   Right handed    Social Determinants of Health   Financial Resource Strain: Not on file  Food Insecurity: Not on file  Transportation Needs: Not on file  Physical Activity: Not on file  Stress: Not on file  Social Connections: Not on file  Intimate Partner Violence: Not on file   PHYSICAL EXAM  Vitals:   10/30/20 0914  BP: 126/75  Pulse: 69  Weight: 174 lb (78.9 kg)  Height: 5\' 8"  (1.727 m)    Body mass index is 26.46 kg/m.  Generalized: Well developed, in no acute distress  MMSE - Mini Mental State Exam 10/30/2020 04/18/2020 10/12/2019  Orientation to time 2 3 3   Orientation to Place 4 5 3   Registration 3 3 3   Attention/ Calculation 3 4 5   Recall 2 2 3   Language- name 2 objects 2 2 2   Language- repeat 1 0 1  Language- follow 3 step command 3 3 3   Language- read & follow direction 1 1 1   Write a sentence 1 1 1   Copy design 1 1 1   Total score 23 25 26     Neurological examination  Mentation: Alert oriented to time, place, history is provided by both he and his wife. Follows all commands  speech and language fluent Cranial nerve II-XII: Pupils were equal round reactive to light. Extraocular movements were full, visual field were full on confrontational test. Facial sensation and strength were normal. Head turning and shoulder shrug were normal and symmetric. No masking of face is seen.  Motor: The motor testing reveals 5 over 5 strength of all 4 extremities. Good symmetric motor tone is noted throughout.  Sensory: Sensory testing is intact to soft touch on all 4  extremities. No evidence of extinction is noted.  Coordination: Cerebellar testing reveals good finger-nose-finger and heel-to-shin bilaterally.  No tremor was noted.  No tremor was translated to handwriting sample. Gait and station: Able to stand from seated position with arms crossed, gait is slightly wide-based, stagger type gait, normal arm swing, good turns Reflexes: Deep tendon reflexes are symmetric and normal bilaterally.   DIAGNOSTIC DATA (LABS, IMAGING, TESTING) - I reviewed patient records, labs, notes, testing and imaging myself where available.  Lab Results  Component Value Date   WBC 9.9 05/14/2018   HGB 13.9 05/14/2018   HCT 42.9 05/14/2018   MCV 87.7 05/14/2018   PLT 201 05/14/2018      Component Value Date/Time   NA 139 05/14/2018 1419   K 3.7 05/14/2018 1419   CL 102 05/14/2018 1419   CO2 27 05/14/2018 1419   GLUCOSE 85 05/14/2018 1419   BUN 17 05/14/2018 1419   CREATININE 1.10 05/14/2018 1419   CALCIUM 9.1 05/14/2018 1419   PROT 6.4 (L) 12/03/2017 0819   ALBUMIN 3.6 12/03/2017 0819   AST 19 12/03/2017 0819   ALT 20 12/03/2017 0819   ALKPHOS 64 12/03/2017 0819   BILITOT 0.8 12/03/2017 0819   GFRNONAA >60 05/14/2018 1419   GFRAA >60 05/14/2018 1419   No results found for: CHOL, HDL, LDLCALC, LDLDIRECT, TRIG, CHOLHDL No results found for: HGBA1C Lab Results  Component Value Date   VITAMINB12 268 07/22/2017   No results found for: TSH    ASSESSMENT AND PLAN 70 y.o. year old male  has a past medical history of Anticoagulated, Benign essential tremor (10/12/2019), Depression, Gait abnormality, Hearing loss, History of kidney stones, History of traumatic brain injury (10/2008), Hyperlipidemia, Hypertension, Mild memory disturbance, Nocturia, Prostate cancer ([place]), Pulmonary embolism, bilateral ([place]) (12/02/2017), Wears dentures, Wears glasses, and Wears hearing aid in both ears. here with:  1.  Memory disturbance, MMSE 23/30 today 2.  Strong family history  of Alzheimer's disease 3.  Tremor, likely essential tremor  -I do not note any tremor on exam today, no signs of PD, has remained overall stable, no major changes -Continue propanolol 40 mg as needed -Increasing Namenda with PCP, can go up to full dosing 10 mg twice daily for memory if tolerates -Try taking Aricept in AM to help with dreams -Encouraged to increase activity, exercise  -Follow-up in 6 to 8 months or sooner if needed  I spent 31 minutes of face-to-face and non-face-to-face time with patient.  This included previsit chart review, lab review, study review, order entry, discussing medications, management, follow-up. With Butler Denmark, AGNP-C, DNP 10/30/2020, 9:26 AM [facility] [address] [place],  [id number] [phone]

## 2020-10-30 ENCOUNTER — Encounter: Payer: Self-pay | Admitting: Neurology

## 2020-10-30 ENCOUNTER — Ambulatory Visit: Payer: Medicare Other | Admitting: Neurology

## 2020-10-30 ENCOUNTER — Other Ambulatory Visit: Payer: Self-pay

## 2020-10-30 VITALS — BP 126/75 | HR 69 | Ht 68.0 in | Wt 174.0 lb

## 2020-10-30 DIAGNOSIS — G250 Essential tremor: Secondary | ICD-10-CM | POA: Diagnosis not present

## 2020-10-30 DIAGNOSIS — R413 Other amnesia: Secondary | ICD-10-CM | POA: Diagnosis not present

## 2020-10-30 NOTE — Progress Notes (Signed)
I have read the note, and I agree with the clinical assessment and plan.  Hilari Wethington K Terence Bart   

## 2020-10-30 NOTE — Patient Instructions (Signed)
Will continue to follow overtime  Continue with higher dose Namenda Try taking Aricept in the morning Try to increase your activity  See you back in 6-8 months

## 2020-11-02 DIAGNOSIS — E782 Mixed hyperlipidemia: Secondary | ICD-10-CM | POA: Diagnosis not present

## 2020-11-02 DIAGNOSIS — G300 Alzheimer's disease with early onset: Secondary | ICD-10-CM | POA: Diagnosis not present

## 2020-11-02 DIAGNOSIS — I10 Essential (primary) hypertension: Secondary | ICD-10-CM | POA: Diagnosis not present

## 2020-11-02 DIAGNOSIS — N1831 Chronic kidney disease, stage 3a: Secondary | ICD-10-CM | POA: Diagnosis not present

## 2020-11-06 ENCOUNTER — Other Ambulatory Visit: Payer: Self-pay

## 2020-11-06 ENCOUNTER — Encounter: Payer: Medicare Other | Attending: Registered Nurse | Admitting: Registered Nurse

## 2020-11-06 ENCOUNTER — Encounter: Payer: Self-pay | Admitting: Registered Nurse

## 2020-11-06 VITALS — BP 112/74 | HR 62 | Temp 97.7°F | Ht 68.0 in | Wt 171.8 lb

## 2020-11-06 DIAGNOSIS — M545 Low back pain, unspecified: Secondary | ICD-10-CM | POA: Diagnosis not present

## 2020-11-06 DIAGNOSIS — M542 Cervicalgia: Secondary | ICD-10-CM | POA: Insufficient documentation

## 2020-11-06 DIAGNOSIS — G894 Chronic pain syndrome: Secondary | ICD-10-CM | POA: Insufficient documentation

## 2020-11-06 DIAGNOSIS — M5412 Radiculopathy, cervical region: Secondary | ICD-10-CM | POA: Diagnosis not present

## 2020-11-06 DIAGNOSIS — M961 Postlaminectomy syndrome, not elsewhere classified: Secondary | ICD-10-CM | POA: Diagnosis not present

## 2020-11-06 DIAGNOSIS — Z79891 Long term (current) use of opiate analgesic: Secondary | ICD-10-CM | POA: Diagnosis not present

## 2020-11-06 DIAGNOSIS — Z5181 Encounter for therapeutic drug level monitoring: Secondary | ICD-10-CM | POA: Insufficient documentation

## 2020-11-06 DIAGNOSIS — M546 Pain in thoracic spine: Secondary | ICD-10-CM

## 2020-11-06 DIAGNOSIS — G8929 Other chronic pain: Secondary | ICD-10-CM | POA: Diagnosis not present

## 2020-11-06 MED ORDER — OXYCODONE-ACETAMINOPHEN 7.5-325 MG PO TABS: 1.0000 | ORAL_TABLET | Freq: Four times a day (QID) | ORAL | 0 refills | Status: DC | PRN

## 2020-11-06 NOTE — Progress Notes (Signed)
Subjective:    Patient ID: Fred [name], male    DOB: 1951/05/05, 70 y.o.   MRN: [id number]  HPI: Fred [name] is a 70 y.o. male who returns for follow up appointment for chronic pain and medication refill. He states his pain is located in his neck radiating into his bilateral shoulders and mid- lower back pain. [name] and son reports he's only receiving 3 hours of relief of his pain with his current medication regimen. Mrs. Sperbeck dispenses [name] medication, he had his current medication filled on 10/29/2020, he was given instruction to increase his frequency to 5 times a day as needed for pain, from 20 mg to 25 mg, they verbalize understanding.   Ms. Collier was called regarding the above , she wasn't feeling well today she states. She verbalizes understanding of the above.  He rates his pain 4. His current exercise regime is walking and performing stretching exercises.    [name] Morphine equivalent is 30.00 MME.   Last UDS was Performed on 08/01/2020, it was consistent.   Pain Inventory Average Pain 5 Pain Right Now 4 My pain is sharp, stabbing, tingling, and aching  In the last 24 hours, has pain interfered with the following? General activity 5 Relation with others 6 Enjoyment of life 5 What TIME of day is your pain at its worst? daytime and evening Sleep (in general) Fair  Pain is worse with: walking, bending, standing, and some activites Pain improves with: medication Relief from Meds: 4  Family History  Problem Relation Age of Onset   Stroke Mother    Dementia Mother    Heart disease Father    Stroke Father        heat stroke   Kidney disease Brother    Dementia Brother    Social History   Socioeconomic History   Marital status: Married    Spouse name: Not on file   Number of children: 6   Years of education: 12+   Highest education level: Not on file  Occupational History   Occupation: Retired  Tobacco Use   Smoking status:  Former    Packs/day: 1.00    Years: 30.00    Pack years: 30.00    Types: Cigarettes    Quit date: 05/25/2001    Years since quitting: 19.4   Smokeless tobacco: Former    Types: Chew, Snuff    Quit date: 09/22/2001  Vaping Use   Vaping Use: Never used  Substance and Sexual Activity   Alcohol use: Not Currently   Drug use: No   Sexual activity: Not on file  Other Topics Concern   Not on file  Social History Narrative   Lives with wife   Caffeine use: Coffee daily (1-2 per day)   Some soda   Right handed    Social Determinants of Health   Financial Resource Strain: Not on file  Food Insecurity: Not on file  Transportation Needs: Not on file  Physical Activity: Not on file  Stress: Not on file  Social Connections: Not on file   Past Surgical History:  Procedure Laterality Date   ANTERIOR CERVICAL DECOMP/DISCECTOMY FUSION  09-19-2010    dr Ronnald Ramp  @[facility]    COLONOSCOPY W/ POLYPECTOMY  last one 03/ 2019   CYSTOSCOPY N/A 05/17/2018   Procedure: CYSTOSCOPY/ REMOVAL OF BLADDER CALCULUS;  Surgeon: [name], MD;  Location[facility];  Service: Urology;  Laterality: N/A;  ONLY NEEDS 45 MIN   FINGER SURGERY  1970s   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 RIGHT MIDDLE FINGER   IMPLANTATION BONE ANCHORED HEARING AID Right 2011    @[facility]    "no longer have this"   LYMPHADENECTOMY Bilateral 08/04/2013   Procedure: LYMPHADENECTOMY;  Surgeon: Dutch Gray, MD;  [facility];  Service: Urology;  Laterality: Bilateral;   PROSTATE SURGERY     ROBOT ASSISTED LAPAROSCOPIC RADICAL PROSTATECTOMY N/A 08/04/2013   Procedure: ROBOTIC ASSISTED LAPAROSCOPIC RADICAL PROSTATECTOMY LEVEL 2;  Surgeon: Dutch Gray, MD;  [facility];  Service: Urology;  Laterality: N/A;   Past Surgical History:  Procedure Laterality Date   ANTERIOR CERVICAL DECOMP/DISCECTOMY FUSION  09-19-2010    dr Ronnald Ramp  @[facility]    COLONOSCOPY W/ POLYPECTOMY  last one 03/ 2019   CYSTOSCOPY N/A 05/17/2018    Procedure: CYSTOSCOPY/ REMOVAL OF BLADDER CALCULUS;  Surgeon: [name], MD;  [facility];  Service: Urology;  Laterality: N/A;  ONLY NEEDS 45 MIN   FINGER SURGERY  1970s   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 RIGHT MIDDLE FINGER   IMPLANTATION BONE ANCHORED HEARING AID Right 2011    @[facility]    "no longer have this"   LYMPHADENECTOMY Bilateral 08/04/2013   Procedure: LYMPHADENECTOMY;  Surgeon: Dutch Gray, MD;  [facility];  Service: Urology;  Laterality: Bilateral;   PROSTATE SURGERY     ROBOT ASSISTED LAPAROSCOPIC RADICAL PROSTATECTOMY N/A 08/04/2013   Procedure: ROBOTIC ASSISTED LAPAROSCOPIC RADICAL PROSTATECTOMY LEVEL 2;  Surgeon: Dutch Gray, MD;  [facility];  Service: Urology;  Laterality: N/A;   Past Medical History:  Diagnosis Date   Anticoagulated    xarelto   Benign essential tremor 10/12/2019   Depression    Gait abnormality    MILD --- DOES NOT USE CANE   Hearing loss    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   History of kidney stones    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 ,OCCIPITAL SKULL FRACTURE-- RESIDUAL MILD MEMORY ISSUES AND RIGHT SIDE HEARING LOSS (PT FELL ON HEAD FROM 6 FT LADDER)   Hyperlipidemia    Hypertension    Mild memory disturbance    DUE TO TBI , 10-2008   (per pt mild)   Nocturia    twice   Prostate cancer ([place])    UROLOGIST-- DR [name]---  Stage T2a, Gleason 4+3, PSA 5.4,  s/p  radical prostatectomy 2015;   05-14-2018  per pt PSA undetectable   Pulmonary embolism, bilateral ([place]) 12/02/2017   currently taking xarelto   Wears dentures    upper   Wears glasses    Wears hearing aid in both ears    BP 112/74 (BP Location: Right Arm, Patient Position: Sitting, Cuff Size: Large)   Pulse 62   Temp 97.7 F (36.5 C) (Oral)   Ht 5\' 8"  (1.727 m)   Wt 171 lb 12.8 oz (77.9 kg)   SpO2 97%   BMI 26.12 kg/m   Opioid Risk Score:   Fall Risk Score:  `1  Depression screen PHQ  2/9  Depression screen [facility] 2/9 10/09/2020 07/04/2020 02/02/2020 12/30/2019 12/02/2019 11/07/2019 10/22/2018  Decreased Interest 0 0 0 0 0 0 0  Down, Depressed, Hopeless 0 0 0 0 0 0 1  PHQ - 2 Score 0 0 0 0 0 0 1  Altered sleeping - - - - - 0 -  Tired, decreased energy - - - - - 0 -  Change in appetite - - - - -  0 -  Feeling bad or failure about yourself  - - - - - 0 -  Trouble concentrating - - - - - 0 -  Moving slowly or fidgety/restless - - - - - 0 -  Suicidal thoughts - - - - - 0 -  PHQ-9 Score - - - - - 0 -      Review of Systems  Constitutional: Negative.   HENT: Negative.    Eyes: Negative.   Respiratory: Negative.    Cardiovascular: Negative.   Gastrointestinal: Negative.   Endocrine: Negative.   Genitourinary: Negative.   Musculoskeletal:  Positive for back pain.       LOWER BACK PAIN  Skin: Negative.   Hematological: Negative.   Psychiatric/Behavioral: Negative.        Objective:   Physical Exam Vitals and nursing note reviewed.  Constitutional:      Appearance: Normal appearance.  Neck:     Comments: Cervical Paraspinal Tenderness: C-5-C-6  Cardiovascular:     Rate and Rhythm: Normal rate and regular rhythm.     Pulses: Normal pulses.     Heart sounds: Normal heart sounds.  Pulmonary:     Effort: Pulmonary effort is normal.     Breath sounds: Normal breath sounds.  Musculoskeletal:     Cervical back: Normal range of motion and neck supple.     Comments: Normal Muscle Bulk and Muscle Testing Reveals:  Upper Extremities: Full ROM and Muscle Strength 5/5  Bilateral AC Joint Tenderness Thoracic Paraspinal Tenderness: T-7-T-9 Lumbar Paraspinal Tenderness: L-4- L-5 Lower Extremities: Full ROM and Muscle Strength 5/5 Arises from Table Slowly Narrow Based  Gait     Skin:    General: Skin is warm and dry.  Neurological:     Mental Status: He is alert and oriented to person, place, and time.  Psychiatric:        Mood and Affect: Mood normal.        Behavior:  Behavior normal.          Assessment & Plan:  1.Cervical Postlaminectomy: Continue HEP and Continue current medication regime. 11/06/2020. 2. Cervicalgia/ Cervical Radiculitis: Continue current medication regime. Continue to Monitor. 11/06/2020 3. Chronic Bilateral Shoulder Pain:  No complaints today. Continue HEP as Tolerated. Continue to monitor.11/06/2020 4. L2 compression fracture/ Chronic Bilateral Low Back Pain without Sciatica: Continue to Monitor:11/06/2020  Refilled:Increased Oxycodone 7.5  mg  one tablet every 6 hours as needed for moderate pain. # 120. Marland Kitchen  We will continue the opioid monitoring program, this consists of regular clinic visits, examinations, urine drug screen, pill counts as well as use of [place] Controlled Substance Reporting system. A 12 month History has been reviewed on the [place] on 11/06/2020.  5. Polyarthralgia: No complaints today. Continue to Monitor. 06/21//2022 6. Chronic Bilateral Thoracic Pain: Continue HEP as Tolerated. Continue  Current medication regimen. Continue to Monitor. 11/06/2020   F/U in 1 month

## 2020-11-06 NOTE — Patient Instructions (Addendum)
May increase his current Oxycodone 5 mg take one tablet to  5 times a day as needed for pain.  His current prescription will last 16 days. To 11/21/2020  When the above prescription is completed.   On 11/22/2020: Begin new prescription  We will change his prescription to  Oxycodone 7.5 mg/325 mg one tablet 4 times a day as needed for pain.   If you have any questions or concerns please call office or send a My-Chart Message   [phone]

## 2020-11-22 ENCOUNTER — Telehealth: Payer: Self-pay | Admitting: *Deleted

## 2020-11-22 MED ORDER — OXYCODONE-ACETAMINOPHEN 7.5-325 MG PO TABS: 1.0000 | ORAL_TABLET | Freq: Four times a day (QID) | ORAL | 0 refills | Status: DC | PRN

## 2020-11-22 NOTE — Telephone Encounter (Signed)
Mrs Birdwell called and says that Fred [name] not [facility] of his oxycodone 7.5/325.  [facility] [address] [facility] [place] [facility]  Will you send a new Rx to the [place]? I have added the pharmacy location and cancelled the old rx.

## 2020-11-22 NOTE — Telephone Encounter (Signed)
PMP was Reviewed: Oxycodone e-scribed. Mrs. Fred [name] is aware of the above.

## 2020-12-07 ENCOUNTER — Encounter: Payer: Self-pay | Admitting: Registered Nurse

## 2020-12-07 ENCOUNTER — Encounter: Payer: Medicare Other | Attending: Registered Nurse | Admitting: Registered Nurse

## 2020-12-07 ENCOUNTER — Other Ambulatory Visit: Payer: Self-pay

## 2020-12-07 VITALS — BP 116/82 | HR 66 | Temp 99.0°F | Ht 68.0 in | Wt 173.0 lb

## 2020-12-07 DIAGNOSIS — G894 Chronic pain syndrome: Secondary | ICD-10-CM | POA: Diagnosis not present

## 2020-12-07 DIAGNOSIS — Z5181 Encounter for therapeutic drug level monitoring: Secondary | ICD-10-CM | POA: Diagnosis not present

## 2020-12-07 DIAGNOSIS — M542 Cervicalgia: Secondary | ICD-10-CM | POA: Diagnosis not present

## 2020-12-07 DIAGNOSIS — M961 Postlaminectomy syndrome, not elsewhere classified: Secondary | ICD-10-CM | POA: Insufficient documentation

## 2020-12-07 DIAGNOSIS — M545 Low back pain, unspecified: Secondary | ICD-10-CM | POA: Insufficient documentation

## 2020-12-07 DIAGNOSIS — G8929 Other chronic pain: Secondary | ICD-10-CM | POA: Diagnosis not present

## 2020-12-07 DIAGNOSIS — Z79891 Long term (current) use of opiate analgesic: Secondary | ICD-10-CM | POA: Diagnosis not present

## 2020-12-07 DIAGNOSIS — M546 Pain in thoracic spine: Secondary | ICD-10-CM | POA: Insufficient documentation

## 2020-12-07 DIAGNOSIS — M5412 Radiculopathy, cervical region: Secondary | ICD-10-CM | POA: Diagnosis not present

## 2020-12-07 MED ORDER — OXYCODONE-ACETAMINOPHEN 7.5-325 MG PO TABS: 1.0000 | ORAL_TABLET | Freq: Four times a day (QID) | ORAL | 0 refills | Status: DC | PRN

## 2020-12-07 NOTE — Progress Notes (Signed)
Subjective:    Patient ID: Fred [name], male    DOB: Nov 29, 1950, 70 y.o.   MRN: [id number]  HPI: Fred [name] is a 70 y.o. male who returns for follow up appointment for chronic pain and medication refill. He states his  pain is located in his neck radiating into his bilateral shoulders and mid- lower back pain. He rates his pain 5. His current exercise regime is walking and performing stretching exercises.  Fred [name] Morphine equivalent is 45.00 MME.   Oral Swab was Performed today.    Pain Inventory Average Pain 6 Pain Right Now 5 My pain is sharp, stabbing, and aching  In the last 24 hours, has pain interfered with the following? General activity 7 Relation with others 6 Enjoyment of life 7 What TIME of day is your pain at its worst? daytime, evening, and night Sleep (in general) Fair  Pain is worse with: walking, bending, sitting, standing, and some activites Pain improves with: rest and medication Relief from Meds: 5  Family History  Problem Relation Age of Onset   Stroke Mother    Dementia Mother    Heart disease Father    Stroke Father        heat stroke   Kidney disease Brother    Dementia Brother    Social History   Socioeconomic History   Marital status: Married    Spouse name: Not on file   Number of children: 6   Years of education: 12+   Highest education level: Not on file  Occupational History   Occupation: Retired  Tobacco Use   Smoking status: Former    Packs/day: 1.00    Years: 30.00    Pack years: 30.00    Types: Cigarettes    Quit date: 05/25/2001    Years since quitting: 19.5   Smokeless tobacco: Former    Types: Chew, Snuff    Quit date: 09/22/2001  Vaping Use   Vaping Use: Never used  Substance and Sexual Activity   Alcohol use: Not Currently   Drug use: No   Sexual activity: Not on file  Other Topics Concern   Not on file  Social History Narrative   Lives with wife   Caffeine use: Coffee daily (1-2 per day)   Some  soda   Right handed    Social Determinants of Health   Financial Resource Strain: Not on file  Food Insecurity: Not on file  Transportation Needs: Not on file  Physical Activity: Not on file  Stress: Not on file  Social Connections: Not on file   Past Surgical History:  Procedure Laterality Date   ANTERIOR CERVICAL DECOMP/DISCECTOMY FUSION  09-19-2010    dr Ronnald Ramp  '@[facility]'$    COLONOSCOPY W/ POLYPECTOMY  last one 03/ 2019   CYSTOSCOPY N/A 05/17/2018   Procedure: CYSTOSCOPY/ REMOVAL OF BLADDER CALCULUS;  Surgeon: [name], MD;  Location: [facility];  Service: Urology;  Laterality: N/A;  ONLY NEEDS 45 MIN   FINGER SURGERY  1970s   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 RIGHT MIDDLE FINGER   IMPLANTATION BONE ANCHORED HEARING AID Right 2011    '@[facility]'$    "no longer have this"   LYMPHADENECTOMY Bilateral 08/04/2013   Procedure: LYMPHADENECTOMY;  Surgeon: Dutch Gray, MD;  Location: [facility];  Service: Urology;  Laterality: Bilateral;   PROSTATE SURGERY     ROBOT ASSISTED LAPAROSCOPIC RADICAL PROSTATECTOMY N/A 08/04/2013   Procedure: ROBOTIC ASSISTED LAPAROSCOPIC RADICAL PROSTATECTOMY LEVEL 2;  Surgeon:  Dutch Gray, MD;  Location: [facility];  Service: Urology;  Laterality: N/A;   Past Surgical History:  Procedure Laterality Date   ANTERIOR CERVICAL DECOMP/DISCECTOMY FUSION  09-19-2010    dr Ronnald Ramp  '@[facility]'$    COLONOSCOPY W/ POLYPECTOMY  last one 03/ 2019   CYSTOSCOPY N/A 05/17/2018   Procedure: CYSTOSCOPY/ REMOVAL OF BLADDER CALCULUS;  Surgeon: [name], MD;  Location: [facility];  Service: Urology;  Laterality: N/A;  ONLY NEEDS 45 MIN   FINGER SURGERY  1970s   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 RIGHT MIDDLE FINGER   IMPLANTATION BONE ANCHORED HEARING AID Right 2011    '@[facility]'$    "no longer have this"   LYMPHADENECTOMY Bilateral 08/04/2013   Procedure: LYMPHADENECTOMY;  Surgeon: Dutch Gray, MD;  [facility];  Service: Urology;  Laterality:  Bilateral;   PROSTATE SURGERY     ROBOT ASSISTED LAPAROSCOPIC RADICAL PROSTATECTOMY N/A 08/04/2013   Procedure: ROBOTIC ASSISTED LAPAROSCOPIC RADICAL PROSTATECTOMY LEVEL 2;  Surgeon: Dutch Gray, MD;  [facility]: [facility];  Service: Urology;  Laterality: N/A;   Past Medical History:  Diagnosis Date   Anticoagulated    xarelto   Benign essential tremor 10/12/2019   Depression    Gait abnormality    MILD --- DOES NOT USE CANE   Hearing loss    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   History of kidney stones    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 ,OCCIPITAL SKULL FRACTURE-- RESIDUAL MILD MEMORY ISSUES AND RIGHT SIDE HEARING LOSS (PT FELL ON HEAD FROM 6 FT LADDER)   Hyperlipidemia    Hypertension    Mild memory disturbance    DUE TO TBI , 10-2008   (per pt mild)   Nocturia    twice   Prostate cancer ([place])    UROLOGIST-- DR [name]---  Stage T2a, Gleason 4+3, PSA 5.4,  s/p  radical prostatectomy 2015;   05-14-2018  per pt PSA undetectable   Pulmonary embolism, bilateral ([place]) 12/02/2017   currently taking xarelto   Wears dentures    upper   Wears glasses    Wears hearing aid in both ears    BP 116/82   Pulse 66   Temp 99 F (37.2 C) (Oral)   Ht '5\' 8"'$  (1.727 m)   Wt 173 lb (78.5 kg)   SpO2 96%   BMI 26.30 kg/m   Opioid Risk Score:   Fall Risk Score:  `1  Depression screen PHQ 2/9  Depression screen [facility] 2/9 10/09/2020 07/04/2020 02/02/2020 12/30/2019 12/02/2019 11/07/2019 10/22/2018  Decreased Interest 0 0 0 0 0 0 0  Down, Depressed, Hopeless 0 0 0 0 0 0 1  PHQ - 2 Score 0 0 0 0 0 0 1  Altered sleeping - - - - - 0 -  Tired, decreased energy - - - - - 0 -  Change in appetite - - - - - 0 -  Feeling bad or failure about yourself  - - - - - 0 -  Trouble concentrating - - - - - 0 -  Moving slowly or fidgety/restless - - - - - 0 -  Suicidal thoughts - - - - - 0 -  PHQ-9 Score - - - - - 0 -      Review of Systems  Constitutional: Negative.    HENT: Negative.    Eyes: Negative.   Respiratory: Negative.    Cardiovascular: Negative.   Gastrointestinal: Negative.  Endocrine: Negative.   Genitourinary: Negative.   Musculoskeletal:  Positive for back pain and neck pain.  Skin: Negative.   Allergic/Immunologic: Negative.   Neurological: Negative.   Hematological: Negative.   Psychiatric/Behavioral: Negative.        Objective:   Physical Exam Vitals and nursing note reviewed.  Constitutional:      Appearance: Normal appearance.  Neck:     Comments: Cervical Paraspinal Tenderness: C-5-C-6 Cardiovascular:     Rate and Rhythm: Normal rate and regular rhythm.     Pulses: Normal pulses.     Heart sounds: Normal heart sounds.  Pulmonary:     Effort: Pulmonary effort is normal.     Breath sounds: Normal breath sounds.  Musculoskeletal:     Cervical back: Normal range of motion and neck supple.     Comments: Normal Muscle Bulk and Muscle Testing Reveals:  Upper Extremities: Full ROM and Muscle Strength 5/5 Bilateral AC Joint Tenderness  Thoracic Paraspinal Tenderness: T-7-T-9  Lumbar Paraspinal Tenderness: L-4-L-5 Lower Extremities : Full ROM and Muscle Strength 5/5 Arises from Table with ease Narrow Based Gait     Skin:    General: Skin is warm and dry.  Neurological:     Mental Status: He is alert and oriented to person, place, and time.  Psychiatric:        Mood and Affect: Mood normal.        Behavior: Behavior normal.         Assessment & Plan:  1.Cervical Postlaminectomy: Continue HEP and Continue current medication regime. 12/07/2020. 2. Cervicalgia/ Cervical Radiculitis: Continue current medication regime. Continue to Monitor. 12/07/2020 3. Chronic Bilateral Shoulder Pain:  No complaints today. Continue HEP as Tolerated. Continue to monitor.12/07/2020 4. L2 compression fracture/ Chronic Bilateral Low Back Pain without Sciatica: Continue to Monitor:12/07/2020  Refilled: Oxycodone 7.5  mg  one tablet  every 6 hours as needed for moderate pain. # 120. Marland Kitchen  We will continue the opioid monitoring program, this consists of regular clinic visits, examinations, urine drug screen, pill counts as well as use of [place] Controlled Substance Reporting system. A 12 month History has been reviewed on the [place] on 12/07/2020.  5. Polyarthralgia: No complaints today. Continue to Monitor. 07/22//2022 6. Chronic Bilateral Thoracic Pain: Continue HEP as Tolerated. Continue  Current medication regimen. Continue to Monitor. 12/07/2020   F/U in 1 month

## 2020-12-14 LAB — DRUG TOX MONITOR 1 W/CONF, ORAL FLD
Amphetamines: NEGATIVE ng/mL (ref <10)
Barbiturates: NEGATIVE ng/mL (ref <10)
Benzodiazepines: NEGATIVE ng/mL (ref <0.50)
Buprenorphine: NEGATIVE ng/mL (ref <0.10)
Buprenorphine: NEGATIVE ng/mL (ref <0.10)
Cocaine: NEGATIVE ng/mL (ref <5.0)
Codeine: NEGATIVE ng/mL (ref <2.5)
Cotinine: >250 ng/mL — ABNORMAL HIGH (ref <5.0)
Dihydrocodeine: NEGATIVE ng/mL (ref <2.5)
Fentanyl: NEGATIVE ng/mL (ref <0.10)
Fentanyl: NEGATIVE ng/mL (ref <0.10)
Heroin Metabolite: NEGATIVE ng/mL (ref <1.0)
Hydrocodone: NEGATIVE ng/mL (ref <2.5)
Hydromorphone: NEGATIVE ng/mL (ref <2.5)
MARIJUANA: NEGATIVE ng/mL (ref <2.5)
MDMA: NEGATIVE ng/mL (ref <10)
Meprobamate: NEGATIVE ng/mL (ref <2.5)
Methadone: NEGATIVE ng/mL (ref <5.0)
Morphine: NEGATIVE ng/mL (ref <2.5)
Naloxone: NEGATIVE ng/mL (ref <0.25)
Nicotine Metabolite: POSITIVE ng/mL — AB (ref <5.0)
Norbuprenorphine: NEGATIVE ng/mL (ref <0.50)
Norhydrocodone: NEGATIVE ng/mL (ref <2.5)
Noroxycodone: 11 ng/mL — ABNORMAL HIGH (ref <2.5)
Opiates: POSITIVE ng/mL — AB (ref <2.5)
Oxycodone: >250 ng/mL — ABNORMAL HIGH (ref <2.5)
Oxymorphone: NEGATIVE ng/mL (ref <2.5)
Phencyclidine: NEGATIVE ng/mL (ref <10)
Tapentadol: NEGATIVE ng/mL (ref <5.0)
Tramadol: NEGATIVE ng/mL (ref <5.0)
Zolpidem: NEGATIVE ng/mL (ref <5.0)

## 2020-12-14 LAB — DRUG TOX ALC METAB W/CON, ORAL FLD: Alcohol Metabolite: NEGATIVE ng/mL (ref <25)

## 2020-12-18 ENCOUNTER — Telehealth: Payer: Self-pay | Admitting: *Deleted

## 2020-12-18 NOTE — Telephone Encounter (Signed)
Oral swab drug screen was consistent for prescribed medications.  ?

## 2021-01-07 ENCOUNTER — Other Ambulatory Visit: Payer: Self-pay

## 2021-01-07 ENCOUNTER — Encounter: Payer: Self-pay | Admitting: Registered Nurse

## 2021-01-07 ENCOUNTER — Encounter: Payer: Medicare Other | Attending: Registered Nurse | Admitting: Registered Nurse

## 2021-01-07 VITALS — BP 128/83 | HR 67 | Temp 98.2°F | Ht 68.0 in | Wt 177.0 lb

## 2021-01-07 DIAGNOSIS — M4802 Spinal stenosis, cervical region: Secondary | ICD-10-CM | POA: Diagnosis not present

## 2021-01-07 DIAGNOSIS — M546 Pain in thoracic spine: Secondary | ICD-10-CM | POA: Insufficient documentation

## 2021-01-07 DIAGNOSIS — G8929 Other chronic pain: Secondary | ICD-10-CM | POA: Diagnosis not present

## 2021-01-07 DIAGNOSIS — Z5181 Encounter for therapeutic drug level monitoring: Secondary | ICD-10-CM | POA: Diagnosis not present

## 2021-01-07 DIAGNOSIS — Z79891 Long term (current) use of opiate analgesic: Secondary | ICD-10-CM | POA: Diagnosis not present

## 2021-01-07 DIAGNOSIS — M542 Cervicalgia: Secondary | ICD-10-CM | POA: Diagnosis not present

## 2021-01-07 DIAGNOSIS — G894 Chronic pain syndrome: Secondary | ICD-10-CM | POA: Diagnosis not present

## 2021-01-07 DIAGNOSIS — M5412 Radiculopathy, cervical region: Secondary | ICD-10-CM | POA: Insufficient documentation

## 2021-01-07 DIAGNOSIS — M961 Postlaminectomy syndrome, not elsewhere classified: Secondary | ICD-10-CM | POA: Insufficient documentation

## 2021-01-07 DIAGNOSIS — M545 Low back pain, unspecified: Secondary | ICD-10-CM | POA: Diagnosis not present

## 2021-01-07 MED ORDER — OXYCODONE-ACETAMINOPHEN 7.5-325 MG PO TABS: 1.0000 | ORAL_TABLET | Freq: Four times a day (QID) | ORAL | 0 refills | Status: DC | PRN

## 2021-01-07 NOTE — Progress Notes (Signed)
Subjective:    Patient ID: Fred [name], male    DOB: 1951-01-25, 70 y.o.   MRN: [id number]  HPI: Fred [name] is a 70 y.o. male who returns for follow up appointment for chronic pain and medication refill. He states his  pain is located in his neck radiating into his bilateral shoulders and lower back pain.   Mrs. [name] reports Fred [name] having increase frequency of lower back pain and was asking questions regarding spinal stimulator, she has notice he is laying in bed longer with no desire to get out of the bed due to increase intensity of lower back pain. This provider asked Fred [name] if he was depressed, he denies depression or any suicidal ideation. He stated if the spinal stimulator would help him with his pain he was willing to give it a try. His desire is to be wean off his medication. The above will be discussed with Dr Letta Pate and this provider will give them a call. They verbalize understanding.    He rates his pain 6. His current exercise regime is walking and performing stretching exercises.  Fred [name] Morphine equivalent is 45.00 MME.   Last Oral Swab was Performed on 12/07/2020, it was consistent.      Pain Inventory Average Pain 5 Pain Right Now 6 My pain is constant and sharp  In the last 24 hours, has pain interfered with the following? General activity 6 Relation with others 5 Enjoyment of life 4 What TIME of day is your pain at its worst? varies Sleep (in general) Good  Pain is worse with: bending, sitting, inactivity, and some activites Pain improves with: therapy/exercise and medication Relief from Meds: 3  Family History  Problem Relation Age of Onset   Stroke Mother    Dementia Mother    Heart disease Father    Stroke Father        heat stroke   Kidney disease Brother    Dementia Brother    Social History   Socioeconomic History   Marital status: Married    Spouse name: Not on file   Number of children: 6   Years of  education: 12+   Highest education level: Not on file  Occupational History   Occupation: Retired  Tobacco Use   Smoking status: Former    Packs/day: 1.00    Years: 30.00    Pack years: 30.00    Types: Cigarettes    Quit date: 05/25/2001    Years since quitting: 19.6   Smokeless tobacco: Former    Types: Chew, Snuff    Quit date: 09/22/2001  Vaping Use   Vaping Use: Never used  Substance and Sexual Activity   Alcohol use: Not Currently   Drug use: No   Sexual activity: Not on file  Other Topics Concern   Not on file  Social History Narrative   Lives with wife   Caffeine use: Coffee daily (1-2 per day)   Some soda   Right handed    Social Determinants of Health   Financial Resource Strain: Not on file  Food Insecurity: Not on file  Transportation Needs: Not on file  Physical Activity: Not on file  Stress: Not on file  Social Connections: Not on file   Past Surgical History:  Procedure Laterality Date   ANTERIOR CERVICAL DECOMP/DISCECTOMY FUSION  09-19-2010    dr Ronnald Ramp  '@[facility]'$    COLONOSCOPY W/ POLYPECTOMY  last one 03/ 2019   CYSTOSCOPY N/A 05/17/2018  Procedure: CYSTOSCOPY/ REMOVAL OF BLADDER CALCULUS;  Surgeon: [name], MD;  [facility];  Service: Urology;  Laterality: N/A;  ONLY NEEDS 45 MIN   FINGER SURGERY  1970s   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 RIGHT MIDDLE FINGER   IMPLANTATION BONE ANCHORED HEARING AID Right 2011    '@[facility]'$    "no longer have this"   LYMPHADENECTOMY Bilateral 08/04/2013   Procedure: LYMPHADENECTOMY;  Surgeon: Dutch Gray, MD;  [facility]: [facility];  Service: Urology;  Laterality: Bilateral;   PROSTATE SURGERY     ROBOT ASSISTED LAPAROSCOPIC RADICAL PROSTATECTOMY N/A 08/04/2013   Procedure: ROBOTIC ASSISTED LAPAROSCOPIC RADICAL PROSTATECTOMY LEVEL 2;  Surgeon: Dutch Gray, MD;  [facility];  Service: Urology;  Laterality: N/A;   Past Surgical History:  Procedure Laterality Date   ANTERIOR CERVICAL  DECOMP/DISCECTOMY FUSION  09-19-2010    dr Ronnald Ramp  '@[facility]'$    COLONOSCOPY W/ POLYPECTOMY  last one 03/ 2019   CYSTOSCOPY N/A 05/17/2018   Procedure: CYSTOSCOPY/ REMOVAL OF BLADDER CALCULUS;  Surgeon: [name], MD;  [facility];  Service: Urology;  Laterality: N/A;  ONLY NEEDS 45 MIN   FINGER SURGERY  1970s   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 RIGHT MIDDLE FINGER   IMPLANTATION BONE ANCHORED HEARING AID Right 2011    '@[facility]'$    "no longer have this"   LYMPHADENECTOMY Bilateral 08/04/2013   Procedure: LYMPHADENECTOMY;  Surgeon: Dutch Gray, MD;  [facility];  Service: Urology;  Laterality: Bilateral;   PROSTATE SURGERY     ROBOT ASSISTED LAPAROSCOPIC RADICAL PROSTATECTOMY N/A 08/04/2013   Procedure: ROBOTIC ASSISTED LAPAROSCOPIC RADICAL PROSTATECTOMY LEVEL 2;  Surgeon: Dutch Gray, MD;  [facility];  Service: Urology;  Laterality: N/A;   Past Medical History:  Diagnosis Date   Anticoagulated    xarelto   Benign essential tremor 10/12/2019   Depression    Gait abnormality    MILD --- DOES NOT USE CANE   Hearing loss    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   History of kidney stones    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 ,OCCIPITAL SKULL FRACTURE-- RESIDUAL MILD MEMORY ISSUES AND RIGHT SIDE HEARING LOSS (PT FELL ON HEAD FROM 6 FT LADDER)   Hyperlipidemia    Hypertension    Mild memory disturbance    DUE TO TBI , 10-2008   (per pt mild)   Nocturia    twice   Prostate cancer ([place])    UROLOGIST-- DR [name]---  Stage T2a, Gleason 4+3, PSA 5.4,  s/p  radical prostatectomy 2015;   05-14-2018  per pt PSA undetectable   Pulmonary embolism, bilateral ([place]) 12/02/2017   currently taking xarelto   Wears dentures    upper   Wears glasses    Wears hearing aid in both ears    BP 128/83   Pulse 67   Temp 98.2 F (36.8 C)   Ht '5\' 8"'$  (1.727 m)   Wt 177 lb (80.3 kg)   SpO2 95%   BMI 26.91 kg/m   Opioid Risk  Score:   Fall Risk Score:  `1  Depression screen PHQ 2/9  Depression screen [facility] 2/9 12/07/2020 10/09/2020 07/04/2020 02/02/2020 12/30/2019 12/02/2019 11/07/2019  Decreased Interest 0 0 0 0 0 0 0  Down, Depressed, Hopeless 0 0 0 0 0 0 0  PHQ - 2 Score 0 0 0 0 0 0 0  Altered sleeping - - - - - -  0  Tired, decreased energy - - - - - - 0  Change in appetite - - - - - - 0  Feeling bad or failure about yourself  - - - - - - 0  Trouble concentrating - - - - - - 0  Moving slowly or fidgety/restless - - - - - - 0  Suicidal thoughts - - - - - - 0  PHQ-9 Score - - - - - - 0    Review of Systems  Constitutional: Negative.   HENT: Negative.    Eyes: Negative.   Respiratory: Negative.    Cardiovascular: Negative.   Gastrointestinal: Negative.   Endocrine: Negative.   Genitourinary: Negative.   Musculoskeletal:  Positive for back pain and neck pain.  Allergic/Immunologic: Negative.   Neurological: Negative.   Hematological: Negative.   All other systems reviewed and are negative.     Objective:   Physical Exam Vitals and nursing note reviewed.  Constitutional:      Appearance: Normal appearance.  Cardiovascular:     Rate and Rhythm: Normal rate and regular rhythm.     Pulses: Normal pulses.     Heart sounds: Normal heart sounds.  Pulmonary:     Effort: Pulmonary effort is normal.     Breath sounds: Normal breath sounds.  Musculoskeletal:     Cervical back: Normal range of motion and neck supple.     Comments: Normal Muscle Bulk and Muscle Testing Reveals:  Upper Extremities: Full ROM and Muscle Strength 5/5 Thoracic Paraspinal Tenderness: T-7-T-9 Lumbar Paraspinal Tenderness: L-4-L-5 Lower Extremities: [place] ROM and Muscle Strength 5/5 Arises from Table with ease Narrow Based  Gait     Skin:    General: Skin is warm and dry.  Neurological:     Mental Status: He is alert and oriented to person, place, and time.  Psychiatric:        Mood and Affect: Mood normal.         Behavior: Behavior normal.         Assessment & Plan:  1.Cervical Postlaminectomy: Continue HEP and Continue current medication regime. 01/07/2021. 2. Cervicalgia/ Cervical Radiculitis: Continue current medication regime. Continue to Monitor. 12/07/2020 3. Chronic Bilateral Shoulder Pain: Continue HEP as Tolerated. Continue to monitor.01/07/2021 4. L2 compression fracture/ Chronic Bilateral Low Back Pain without Sciatica: Continue to Monitor:01/07/2021  Refilled: Oxycodone 7.5  mg  one tablet every 6 hours as needed for moderate pain. # 120. Marland Kitchen  We will continue the opioid monitoring program, this consists of regular clinic visits, examinations, urine drug screen, pill counts as well as use of [place] Controlled Substance Reporting system. A 12 month History has been reviewed on the [place] on 01/07/2021.  5. Polyarthralgia: No complaints today. Continue to Monitor. 08/22//2022 6. Chronic Bilateral Thoracic Pain: Continue HEP as Tolerated. Continue  Current medication regimen. Continue to Monitor. 01/07/2021   F/U in 1 month

## 2021-01-08 ENCOUNTER — Telehealth: Payer: Self-pay | Admitting: Registered Nurse

## 2021-01-08 DIAGNOSIS — M961 Postlaminectomy syndrome, not elsewhere classified: Secondary | ICD-10-CM

## 2021-01-08 DIAGNOSIS — M5412 Radiculopathy, cervical region: Secondary | ICD-10-CM

## 2021-01-08 DIAGNOSIS — M545 Low back pain, unspecified: Secondary | ICD-10-CM

## 2021-01-08 NOTE — Telephone Encounter (Signed)
Spoke with Dr Letta Pate, regarding Mr. and Mrs. Winkowski request for spinal stimulator. Dr Letta Pate in agreement, referral will be placed to Dr. Davy Pique and Dr Sima Matas for psych evaluation for spinal cord stimulator. Placed a call to Mrs. Mottley regarding the above, she verbalizes understanding.

## 2021-01-29 DIAGNOSIS — M542 Cervicalgia: Secondary | ICD-10-CM | POA: Diagnosis not present

## 2021-01-29 DIAGNOSIS — M545 Low back pain, unspecified: Secondary | ICD-10-CM | POA: Diagnosis not present

## 2021-02-05 DIAGNOSIS — G300 Alzheimer's disease with early onset: Secondary | ICD-10-CM | POA: Diagnosis not present

## 2021-02-05 DIAGNOSIS — I129 Hypertensive chronic kidney disease with stage 1 through stage 4 chronic kidney disease, or unspecified chronic kidney disease: Secondary | ICD-10-CM | POA: Diagnosis not present

## 2021-02-05 DIAGNOSIS — I10 Essential (primary) hypertension: Secondary | ICD-10-CM | POA: Diagnosis not present

## 2021-02-05 DIAGNOSIS — E782 Mixed hyperlipidemia: Secondary | ICD-10-CM | POA: Diagnosis not present

## 2021-02-05 DIAGNOSIS — N183 Chronic kidney disease, stage 3 unspecified: Secondary | ICD-10-CM | POA: Diagnosis not present

## 2021-02-12 ENCOUNTER — Encounter: Payer: Medicare Other | Attending: Registered Nurse | Admitting: Registered Nurse

## 2021-02-12 ENCOUNTER — Encounter: Payer: Self-pay | Admitting: Registered Nurse

## 2021-02-12 ENCOUNTER — Other Ambulatory Visit: Payer: Self-pay

## 2021-02-12 VITALS — BP 131/83 | HR 60 | Temp 97.7°F | Ht 68.0 in | Wt 179.4 lb

## 2021-02-12 DIAGNOSIS — G8929 Other chronic pain: Secondary | ICD-10-CM | POA: Diagnosis not present

## 2021-02-12 DIAGNOSIS — Z79891 Long term (current) use of opiate analgesic: Secondary | ICD-10-CM | POA: Insufficient documentation

## 2021-02-12 DIAGNOSIS — M545 Low back pain, unspecified: Secondary | ICD-10-CM | POA: Insufficient documentation

## 2021-02-12 DIAGNOSIS — G894 Chronic pain syndrome: Secondary | ICD-10-CM | POA: Insufficient documentation

## 2021-02-12 DIAGNOSIS — M542 Cervicalgia: Secondary | ICD-10-CM | POA: Diagnosis not present

## 2021-02-12 DIAGNOSIS — Z5181 Encounter for therapeutic drug level monitoring: Secondary | ICD-10-CM | POA: Insufficient documentation

## 2021-02-12 MED ORDER — OXYCODONE-ACETAMINOPHEN 7.5-325 MG PO TABS: 1.0000 | ORAL_TABLET | Freq: Four times a day (QID) | ORAL | 0 refills | Status: DC | PRN

## 2021-02-12 NOTE — Progress Notes (Signed)
Subjective:    Patient ID: Fred [name], male    DOB: 01-03-1951, 70 y.o.   MRN: [id number]  HPI: Fred [name] is a 70 y.o. male who returns for follow up appointment for chronic pain and medication refill. He states his pain is located in his lower back. He rates his pain 7. His current exercise regime is walking and performing stretching exercises.  Mr. Bob Morphine equivalent is 45.00 MME.   Last oral swab was performed on  12/07/2020, it was consistent.     Pain Inventory Average Pain 6 Pain Right Now 7 My pain is constant, sharp, stabbing, and aching  In the last 24 hours, has pain interfered with the following? General activity 7 Relation with others 6 Enjoyment of life 5 What TIME of day is your pain at its worst? daytime and evening Sleep (in general) Fair  Pain is worse with: walking, sitting, and standing Pain improves with: therapy/exercise and medication Relief from Meds: 3  Family History  Problem Relation Age of Onset   Stroke Mother    Dementia Mother    Heart disease Father    Stroke Father        heat stroke   Kidney disease Brother    Dementia Brother    Social History   Socioeconomic History   Marital status: Married    Spouse name: Not on file   Number of children: 6   Years of education: 12+   Highest education level: Not on file  Occupational History   Occupation: Retired  Tobacco Use   Smoking status: Former    Packs/day: 1.00    Years: 30.00    Pack years: 30.00    Types: Cigarettes    Quit date: 05/25/2001    Years since quitting: 19.7   Smokeless tobacco: Former    Types: Chew, Snuff    Quit date: 09/22/2001  Vaping Use   Vaping Use: Never used  Substance and Sexual Activity   Alcohol use: Not Currently   Drug use: No   Sexual activity: Not on file  Other Topics Concern   Not on file  Social History Narrative   Lives with wife   Caffeine use: Coffee daily (1-2 per day)   Some soda   Right handed    Social  Determinants of Health   Financial Resource Strain: Not on file  Food Insecurity: Not on file  Transportation Needs: Not on file  Physical Activity: Not on file  Stress: Not on file  Social Connections: Not on file   Past Surgical History:  Procedure Laterality Date   ANTERIOR CERVICAL DECOMP/DISCECTOMY FUSION  09-19-2010    dr Ronnald Ramp  @[facility]    COLONOSCOPY W/ POLYPECTOMY  last one 03/ 2019   CYSTOSCOPY N/A 05/17/2018   Procedure: CYSTOSCOPY/ REMOVAL OF BLADDER CALCULUS;  Surgeon: [name], MD;  [facility];  Service: Urology;  Laterality: N/A;  ONLY NEEDS 45 MIN   FINGER SURGERY  1970s   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 RIGHT MIDDLE FINGER   IMPLANTATION BONE ANCHORED HEARING AID Right 2011    @[facility]    "no longer have this"   LYMPHADENECTOMY Bilateral 08/04/2013   Procedure: LYMPHADENECTOMY;  Surgeon: Dutch Gray, MD;  [facility];  Service: Urology;  Laterality: Bilateral;   PROSTATE SURGERY     ROBOT ASSISTED LAPAROSCOPIC RADICAL PROSTATECTOMY N/A 08/04/2013   Procedure: ROBOTIC ASSISTED LAPAROSCOPIC RADICAL PROSTATECTOMY LEVEL 2;  Surgeon: Dutch Gray, MD;  Location: [facility];  Service: Urology;  Laterality: N/A;   Past Surgical History:  Procedure Laterality Date   ANTERIOR CERVICAL DECOMP/DISCECTOMY FUSION  09-19-2010    dr Ronnald Ramp  @[facility]    COLONOSCOPY W/ POLYPECTOMY  last one 03/ 2019   CYSTOSCOPY N/A 05/17/2018   Procedure: CYSTOSCOPY/ REMOVAL OF BLADDER CALCULUS;  Surgeon: [name], MD;  [facility];  Service: Urology;  Laterality: N/A;  ONLY NEEDS 45 MIN   FINGER SURGERY  1970s   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 RIGHT MIDDLE FINGER   IMPLANTATION BONE ANCHORED HEARING AID Right 2011    @[facility]    "no longer have this"   LYMPHADENECTOMY Bilateral 08/04/2013   Procedure: LYMPHADENECTOMY;  Surgeon: Dutch Gray, MD;  [facility];  Service: Urology;  Laterality: Bilateral;   PROSTATE SURGERY     ROBOT  ASSISTED LAPAROSCOPIC RADICAL PROSTATECTOMY N/A 08/04/2013   Procedure: ROBOTIC ASSISTED LAPAROSCOPIC RADICAL PROSTATECTOMY LEVEL 2;  Surgeon: Dutch Gray, MD;  [facility];  Service: Urology;  Laterality: N/A;   Past Medical History:  Diagnosis Date   Anticoagulated    xarelto   Benign essential tremor 10/12/2019   Depression    Gait abnormality    MILD --- DOES NOT USE CANE   Hearing loss    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   History of kidney stones    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 ,OCCIPITAL SKULL FRACTURE-- RESIDUAL MILD MEMORY ISSUES AND RIGHT SIDE HEARING LOSS (PT FELL ON HEAD FROM 6 FT LADDER)   Hyperlipidemia    Hypertension    Mild memory disturbance    DUE TO TBI , 10-2008   (per pt mild)   Nocturia    twice   Prostate cancer ([place])    UROLOGIST-- DR [name]---  Stage T2a, Gleason 4+3, PSA 5.4,  s/p  radical prostatectomy 2015;   05-14-2018  per pt PSA undetectable   Pulmonary embolism, bilateral ([place]) 12/02/2017   currently taking xarelto   Wears dentures    upper   Wears glasses    Wears hearing aid in both ears    BP 131/83   Pulse (!) 59   Temp 97.7 F (36.5 C) (Oral)   Ht 5\' 8"  (1.727 m)   Wt 179 lb 6.4 oz (81.4 kg)   SpO2 95%   BMI 27.28 kg/m   Opioid Risk Score:   Fall Risk Score:  `1  Depression screen PHQ 2/9  Depression screen [facility] 2/9 12/07/2020 10/09/2020 07/04/2020 02/02/2020 12/30/2019 12/02/2019 11/07/2019  Decreased Interest 0 0 0 0 0 0 0  Down, Depressed, Hopeless 0 0 0 0 0 0 0  PHQ - 2 Score 0 0 0 0 0 0 0  Altered sleeping - - - - - - 0  Tired, decreased energy - - - - - - 0  Change in appetite - - - - - - 0  Feeling bad or failure about yourself  - - - - - - 0  Trouble concentrating - - - - - - 0  Moving slowly or fidgety/restless - - - - - - 0  Suicidal thoughts - - - - - - 0  PHQ-9 Score - - - - - - 0     Review of Systems  Musculoskeletal:  Positive for back pain and neck pain.   All other systems reviewed and are negative.     Objective:   Physical Exam Vitals and nursing  note reviewed.  Constitutional:      Appearance: Normal appearance.  Cardiovascular:     Rate and Rhythm: Normal rate and regular rhythm.     Pulses: Normal pulses.     Heart sounds: Normal heart sounds.  Pulmonary:     Effort: Pulmonary effort is normal.     Breath sounds: Normal breath sounds.  Musculoskeletal:     Cervical back: Normal range of motion and neck supple.     Comments: Normal Muscle Bulk and Muscle Testing Reveals:  Upper Extremities: Full ROM and Muscle Strength 5/5 Lumbar Paraspinal Tenderness: L-3-L-5 Lower Extremities: Full ROM and Muscle Strength 5/5 Arises from Table with Ease Narrow Based  Gait     Skin:    General: Skin is warm and dry.  Neurological:     Mental Status: He is alert and oriented to person, place, and time.  Psychiatric:        Mood and Affect: Mood normal.        Behavior: Behavior normal.         Assessment & Plan:  1.Cervical Postlaminectomy: Continue HEP and Continue current medication regime. 02/12/2021. 2. Cervicalgia/ Cervical Radiculitis: No complaints today. Continue current medication regime. Continue to Monitor. 02/12/2021 3. Chronic Bilateral Shoulder Pain: No complaints today. Continue HEP as Tolerated. Continue to monitor.02/12/2021 4. L2 compression fracture/ Chronic Bilateral Low Back Pain without Sciatica: Continue to Monitor:02/12/2021  Refilled: Oxycodone 7.5  mg  one tablet every 6 hours as needed for moderate pain. # 120. Marland Kitchen  We will continue the opioid monitoring program, this consists of regular clinic visits, examinations, urine drug screen, pill counts as well as use of [place] Controlled Substance Reporting system. A 12 month History has been reviewed on the [place] on 02/12/2021.  5. Polyarthralgia: No complaints today. Continue to Monitor. 09/27//2022 6. Chronic  Bilateral Thoracic Pain: No complaints today. Continue HEP as Tolerated. Continue  Current medication regimen. Continue to Monitor. 02/12/2021   F/U in 1 month

## 2021-02-26 DIAGNOSIS — M47812 Spondylosis without myelopathy or radiculopathy, cervical region: Secondary | ICD-10-CM | POA: Diagnosis not present

## 2021-02-26 DIAGNOSIS — M47816 Spondylosis without myelopathy or radiculopathy, lumbar region: Secondary | ICD-10-CM | POA: Diagnosis not present

## 2021-03-04 DIAGNOSIS — I129 Hypertensive chronic kidney disease with stage 1 through stage 4 chronic kidney disease, or unspecified chronic kidney disease: Secondary | ICD-10-CM | POA: Diagnosis not present

## 2021-03-04 DIAGNOSIS — G300 Alzheimer's disease with early onset: Secondary | ICD-10-CM | POA: Diagnosis not present

## 2021-03-04 DIAGNOSIS — N1831 Chronic kidney disease, stage 3a: Secondary | ICD-10-CM | POA: Diagnosis not present

## 2021-03-04 DIAGNOSIS — E782 Mixed hyperlipidemia: Secondary | ICD-10-CM | POA: Diagnosis not present

## 2021-03-04 DIAGNOSIS — Z23 Encounter for immunization: Secondary | ICD-10-CM | POA: Diagnosis not present

## 2021-03-04 DIAGNOSIS — R251 Tremor, unspecified: Secondary | ICD-10-CM | POA: Diagnosis not present

## 2021-03-04 DIAGNOSIS — G9389 Other specified disorders of brain: Secondary | ICD-10-CM | POA: Diagnosis not present

## 2021-03-04 DIAGNOSIS — M545 Low back pain, unspecified: Secondary | ICD-10-CM | POA: Diagnosis not present

## 2021-03-07 ENCOUNTER — Encounter: Payer: Medicare Other | Admitting: Psychology

## 2021-03-13 ENCOUNTER — Encounter: Payer: Medicare Other | Attending: Registered Nurse | Admitting: Registered Nurse

## 2021-03-13 ENCOUNTER — Encounter: Payer: Self-pay | Admitting: Registered Nurse

## 2021-03-13 ENCOUNTER — Other Ambulatory Visit: Payer: Self-pay

## 2021-03-13 VITALS — BP 126/84 | HR 66 | Ht 68.0 in | Wt 183.8 lb

## 2021-03-13 DIAGNOSIS — G8929 Other chronic pain: Secondary | ICD-10-CM | POA: Insufficient documentation

## 2021-03-13 DIAGNOSIS — Z79891 Long term (current) use of opiate analgesic: Secondary | ICD-10-CM | POA: Insufficient documentation

## 2021-03-13 DIAGNOSIS — Z5181 Encounter for therapeutic drug level monitoring: Secondary | ICD-10-CM | POA: Diagnosis not present

## 2021-03-13 DIAGNOSIS — M545 Low back pain, unspecified: Secondary | ICD-10-CM | POA: Insufficient documentation

## 2021-03-13 DIAGNOSIS — G894 Chronic pain syndrome: Secondary | ICD-10-CM | POA: Diagnosis not present

## 2021-03-13 MED ORDER — OXYCODONE-ACETAMINOPHEN 7.5-325 MG PO TABS: 1.0000 | ORAL_TABLET | Freq: Four times a day (QID) | ORAL | 0 refills | Status: DC | PRN

## 2021-03-13 NOTE — Progress Notes (Signed)
Subjective:    Patient ID: Fred [name], male    DOB: 09-13-1950, 70 y.o.   MRN: [id number]  HPI: Fred [name] is a 70 y.o. male who returns for follow up appointment for chronic pain and medication refill. He states his pain is located in his lower back. He rates his pain 5. His current exercise regime is walking and performing stretching exercises.  Fred [name] Morphine equivalent is 45.00 MME.   Last Oral Swab was Performed on 12/07/2020, it was consistent.     Pain Inventory Average Pain 5 Pain Right Now 5 My pain is constant, sharp, tingling, and aching  In the last 24 hours, has pain interfered with the following? General activity 6 Relation with others 7 Enjoyment of life 5 What TIME of day is your pain at its worst? evening Sleep (in general) Fair  Pain is worse with: walking, sitting, and standing Pain improves with: heat/ice and medication Relief from Meds: 3  Family History  Problem Relation Age of Onset   Stroke Mother    Dementia Mother    Heart disease Father    Stroke Father        heat stroke   Kidney disease Brother    Dementia Brother    Social History   Socioeconomic History   Marital status: Married    Spouse name: Not on file   Number of children: 6   Years of education: 12+   Highest education level: Not on file  Occupational History   Occupation: Retired  Tobacco Use   Smoking status: Former    Packs/day: 1.00    Years: 30.00    Pack years: 30.00    Types: Cigarettes    Quit date: 05/25/2001    Years since quitting: 19.8   Smokeless tobacco: Former    Types: Chew, Snuff    Quit date: 09/22/2001  Vaping Use   Vaping Use: Never used  Substance and Sexual Activity   Alcohol use: Not Currently   Drug use: No   Sexual activity: Not on file  Other Topics Concern   Not on file  Social History Narrative   Lives with wife   Caffeine use: Coffee daily (1-2 per day)   Some soda   Right handed    Social Determinants of Health    Financial Resource Strain: Not on file  Food Insecurity: Not on file  Transportation Needs: Not on file  Physical Activity: Not on file  Stress: Not on file  Social Connections: Not on file   Past Surgical History:  Procedure Laterality Date   ANTERIOR CERVICAL DECOMP/DISCECTOMY FUSION  09-19-2010    dr Ronnald Ramp  @[facility]    COLONOSCOPY W/ POLYPECTOMY  last one 03/ 2019   CYSTOSCOPY N/A 05/17/2018   Procedure: CYSTOSCOPY/ REMOVAL OF BLADDER CALCULUS;  Surgeon: [name], MD;  [facility];  Service: Urology;  Laterality: N/A;  ONLY NEEDS 45 MIN   FINGER SURGERY  1970s   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 RIGHT MIDDLE FINGER   IMPLANTATION BONE ANCHORED HEARING AID Right 2011    @[facility]    "no longer have this"   LYMPHADENECTOMY Bilateral 08/04/2013   Procedure: LYMPHADENECTOMY;  Surgeon: Dutch Gray, MD;  [facility];  Service: Urology;  Laterality: Bilateral;   PROSTATE SURGERY     ROBOT ASSISTED LAPAROSCOPIC RADICAL PROSTATECTOMY N/A 08/04/2013   Procedure: ROBOTIC ASSISTED LAPAROSCOPIC RADICAL PROSTATECTOMY LEVEL 2;  Surgeon: Dutch Gray, MD;  [facility];  Service:  Urology;  Laterality: N/A;   Past Surgical History:  Procedure Laterality Date   ANTERIOR CERVICAL DECOMP/DISCECTOMY FUSION  09-19-2010    dr Ronnald Ramp  @[facility]    COLONOSCOPY W/ POLYPECTOMY  last one 03/ 2019   CYSTOSCOPY N/A 05/17/2018   Procedure: CYSTOSCOPY/ REMOVAL OF BLADDER CALCULUS;  Surgeon: [name], MD;  [facility];  Service: Urology;  Laterality: N/A;  ONLY NEEDS 45 MIN   FINGER SURGERY  1970s   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 RIGHT MIDDLE FINGER   IMPLANTATION BONE ANCHORED HEARING AID Right 2011    @[facility]    "no longer have this"   LYMPHADENECTOMY Bilateral 08/04/2013   Procedure: LYMPHADENECTOMY;  Surgeon: Dutch Gray, MD;  [facility];  Service: Urology;  Laterality: Bilateral;   PROSTATE SURGERY     ROBOT ASSISTED LAPAROSCOPIC  RADICAL PROSTATECTOMY N/A 08/04/2013   Procedure: ROBOTIC ASSISTED LAPAROSCOPIC RADICAL PROSTATECTOMY LEVEL 2;  Surgeon: Dutch Gray, MD;  [facility];  Service: Urology;  Laterality: N/A;   Past Medical History:  Diagnosis Date   Anticoagulated    xarelto   Benign essential tremor 10/12/2019   Depression    Gait abnormality    MILD --- DOES NOT USE CANE   Hearing loss    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   History of kidney stones    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 ,OCCIPITAL SKULL FRACTURE-- RESIDUAL MILD MEMORY ISSUES AND RIGHT SIDE HEARING LOSS (PT FELL ON HEAD FROM 6 FT LADDER)   Hyperlipidemia    Hypertension    Mild memory disturbance    DUE TO TBI , 10-2008   (per pt mild)   Nocturia    twice   Prostate cancer ([place])    UROLOGIST-- DR [name]---  Stage T2a, Gleason 4+3, PSA 5.4,  s/p  radical prostatectomy 2015;   05-14-2018  per pt PSA undetectable   Pulmonary embolism, bilateral ([place]) 12/02/2017   currently taking xarelto   Wears dentures    upper   Wears glasses    Wears hearing aid in both ears    BP 126/84   Pulse 66   Ht 5\' 8"  (1.727 m)   Wt 183 lb 12.8 oz (83.4 kg)   SpO2 95%   BMI 27.95 kg/m   Opioid Risk Score:   Fall Risk Score:  `1  Depression screen PHQ 2/9  Depression screen [facility] 2/9 12/07/2020 10/09/2020 07/04/2020 02/02/2020 12/30/2019 12/02/2019 11/07/2019  Decreased Interest 0 0 0 0 0 0 0  Down, Depressed, Hopeless 0 0 0 0 0 0 0  PHQ - 2 Score 0 0 0 0 0 0 0  Altered sleeping - - - - - - 0  Tired, decreased energy - - - - - - 0  Change in appetite - - - - - - 0  Feeling bad or failure about yourself  - - - - - - 0  Trouble concentrating - - - - - - 0  Moving slowly or fidgety/restless - - - - - - 0  Suicidal thoughts - - - - - - 0  PHQ-9 Score - - - - - - 0     Review of Systems  Musculoskeletal:  Positive for back pain and neck pain.  All other systems reviewed and are negative.      Objective:   Physical Exam Vitals and nursing note reviewed.  Constitutional:      Appearance: Normal appearance.  Cardiovascular:     Rate and Rhythm: Normal rate and regular rhythm.     Pulses: Normal pulses.     Heart sounds: Normal heart sounds.  Pulmonary:     Effort: Pulmonary effort is normal.     Breath sounds: Normal breath sounds.  Musculoskeletal:     Cervical back: Normal range of motion and neck supple.     Comments: Normal Muscle Bulk and Muscle Testing Reveals:  Upper Extremities: Full ROM and Muscle Strength 5/5 Lumbar Paraspinal Tenderness: L-3-L-5 Lower Extremities: Full ROM and Muscle Strength 5/5 Arises from Table with ease Narrow Based  Gait     Skin:    General: Skin is warm and dry.  Neurological:     Mental Status: He is alert and oriented to person, place, and time.  Psychiatric:        Mood and Affect: Mood normal.        Behavior: Behavior normal.         Assessment & Plan:  1.Cervical Postlaminectomy: Continue HEP and Continue current medication regime. 03/13/2021. 2. Cervicalgia/ Cervical Radiculitis: No complaints today. Continue current medication regime. Continue to Monitor. 03/13/2021 3. Chronic Bilateral Shoulder Pain: No complaints today. Continue HEP as Tolerated. Continue to monitor.03/13/2021 4. L2 compression fracture/ Chronic Bilateral Low Back Pain without Sciatica: Continue to Monitor:03/13/2021  Refilled: Oxycodone 7.5  mg  one tablet every 6 hours as needed for moderate pain. # 120. Marland Kitchen  We will continue the opioid monitoring program, this consists of regular clinic visits, examinations, urine drug screen, pill counts as well as use of [place] Controlled Substance Reporting system. A 12 month History has been reviewed on the [place] on 03/13/2021.  5. Polyarthralgia: No complaints today. Continue to Monitor. 03/13/2021 6. Chronic Bilateral Thoracic Pain: No complaints today.  Continue HEP as Tolerated. Continue  Current medication regimen. Continue to Monitor. 03/13/2021   F/U in 1 month

## 2021-03-14 DIAGNOSIS — M47816 Spondylosis without myelopathy or radiculopathy, lumbar region: Secondary | ICD-10-CM | POA: Diagnosis not present

## 2021-04-09 ENCOUNTER — Encounter: Payer: Medicare Other | Admitting: Registered Nurse

## 2021-07-01 ENCOUNTER — Ambulatory Visit: Payer: Medicare Other | Admitting: Neurology

## 2021-07-15 DIAGNOSIS — Z974 Presence of external hearing-aid: Secondary | ICD-10-CM | POA: Diagnosis not present

## 2021-07-15 DIAGNOSIS — H903 Sensorineural hearing loss, bilateral: Secondary | ICD-10-CM | POA: Diagnosis not present

## 2021-07-23 DIAGNOSIS — L570 Actinic keratosis: Secondary | ICD-10-CM | POA: Diagnosis not present

## 2021-07-23 DIAGNOSIS — L821 Other seborrheic keratosis: Secondary | ICD-10-CM | POA: Diagnosis not present

## 2021-07-23 DIAGNOSIS — B360 Pityriasis versicolor: Secondary | ICD-10-CM | POA: Diagnosis not present

## 2021-07-23 DIAGNOSIS — D1801 Hemangioma of skin and subcutaneous tissue: Secondary | ICD-10-CM | POA: Diagnosis not present

## 2021-07-23 DIAGNOSIS — L281 Prurigo nodularis: Secondary | ICD-10-CM | POA: Diagnosis not present

## 2021-07-23 DIAGNOSIS — D485 Neoplasm of uncertain behavior of skin: Secondary | ICD-10-CM | POA: Diagnosis not present

## 2021-08-15 DIAGNOSIS — D485 Neoplasm of uncertain behavior of skin: Secondary | ICD-10-CM | POA: Diagnosis not present

## 2021-08-28 DIAGNOSIS — R251 Tremor, unspecified: Secondary | ICD-10-CM | POA: Diagnosis not present

## 2021-08-28 DIAGNOSIS — G300 Alzheimer's disease with early onset: Secondary | ICD-10-CM | POA: Diagnosis not present

## 2021-08-28 DIAGNOSIS — N1831 Chronic kidney disease, stage 3a: Secondary | ICD-10-CM | POA: Diagnosis not present

## 2021-08-28 DIAGNOSIS — G9389 Other specified disorders of brain: Secondary | ICD-10-CM | POA: Diagnosis not present

## 2021-08-28 DIAGNOSIS — E782 Mixed hyperlipidemia: Secondary | ICD-10-CM | POA: Diagnosis not present

## 2021-08-28 DIAGNOSIS — M545 Low back pain, unspecified: Secondary | ICD-10-CM | POA: Diagnosis not present

## 2021-08-28 DIAGNOSIS — Z0000 Encounter for general adult medical examination without abnormal findings: Secondary | ICD-10-CM | POA: Diagnosis not present

## 2021-08-28 DIAGNOSIS — I129 Hypertensive chronic kidney disease with stage 1 through stage 4 chronic kidney disease, or unspecified chronic kidney disease: Secondary | ICD-10-CM | POA: Diagnosis not present

## 2021-08-30 ENCOUNTER — Encounter: Payer: Medicare Other | Attending: Physical Medicine & Rehabilitation | Admitting: Physical Medicine & Rehabilitation

## 2021-08-30 ENCOUNTER — Encounter: Payer: Self-pay | Admitting: Physical Medicine & Rehabilitation

## 2021-08-30 ENCOUNTER — Telehealth: Payer: Self-pay | Admitting: Registered Nurse

## 2021-08-30 VITALS — BP 119/77 | HR 57 | Ht 68.0 in | Wt 184.6 lb

## 2021-08-30 DIAGNOSIS — G8929 Other chronic pain: Secondary | ICD-10-CM | POA: Insufficient documentation

## 2021-08-30 DIAGNOSIS — M545 Low back pain, unspecified: Secondary | ICD-10-CM | POA: Diagnosis not present

## 2021-08-30 DIAGNOSIS — M961 Postlaminectomy syndrome, not elsewhere classified: Secondary | ICD-10-CM | POA: Diagnosis not present

## 2021-08-30 MED ORDER — BUPRENORPHINE 20 MCG/HR TD PTWK: 1.0000 | MEDICATED_PATCH | TRANSDERMAL | 1 refills | Status: DC

## 2021-08-30 NOTE — Telephone Encounter (Signed)
Fred [name] is asking if the rx can be called in prior to their visit on 22 due to [place] availability there is not an opening to be seen on 5/14-17  ?

## 2021-08-30 NOTE — Progress Notes (Signed)
? ?Subjective:  ? ? Patient ID: Fred [name], male    DOB: 1950/09/17, 71 y.o.   MRN: [id number] ? ?HPI ?71 year old male with chronic cervicalgia with history of C4-C7 fusion.  He also has a past medical history significant for pulmonary embolus, compression fractures of the L2 lumbar vertebra, was last seen in this clinic in October 2022.  In the interval time he has been going to [place] [facility] where lumbar facet injections were performed.  Patient does not recall if any radiofrequency procedure was performed.  Patient does have memory loss and has difficulty recalling details of any medical visit. ?Pain Inventory ?Average Pain 6 ?Pain Right Now 6 ?My pain is constant and just hurts ? ?In the last 24 hours, has pain interfered with the following? ?General activity 6 ?Relation with others 7 ?Enjoyment of life 5 ?What TIME of day is your pain at its worst? morning , daytime, evening, and night ?Sleep (in general) Good ? ?Pain is worse with: walking, bending, sitting, standing, and some activites ?Pain improves with: rest ?Relief from Meds: 3 ? ?Family History  ?Problem Relation Age of Onset  ? Stroke Mother   ? Dementia Mother   ? Heart disease Father   ? Stroke Father   ?     heat stroke  ? Kidney disease Brother   ? Dementia Brother   ? ?Social History  ? ?Socioeconomic History  ? Marital status: Married  ?  Spouse name: Not on file  ? Number of children: 6  ? Years of education: 12+  ? Highest education level: Not on file  ?Occupational History  ? Occupation: Retired  ?Tobacco Use  ? Smoking status: Former  ?  Packs/day: 1.00  ?  Years: 30.00  ?  Pack years: 30.00  ?  Types: Cigarettes  ?  Quit date: 05/25/2001  ?  Years since quitting: 20.2  ? Smokeless tobacco: Former  ?  Types: Chew, Snuff  ?  Quit date: 09/22/2001  ?Vaping Use  ? Vaping Use: Never used  ?Substance and Sexual Activity  ? Alcohol use: Not Currently  ? Drug use: No  ? Sexual activity: Not on file  ?Other Topics Concern  ? Not on  file  ?Social History Narrative  ? Lives with wife  ? Caffeine use: Coffee daily (1-2 per day)  ? Some soda  ? Right handed   ? ?Social Determinants of Health  ? ?Financial Resource Strain: Not on file  ?Food Insecurity: Not on file  ?Transportation Needs: Not on file  ?Physical Activity: Not on file  ?Stress: Not on file  ?Social Connections: Not on file  ? ?Past Surgical History:  ?Procedure Laterality Date  ? ANTERIOR CERVICAL DECOMP/DISCECTOMY FUSION  09-19-2010    dr Ronnald Ramp  '@[facility]'$   ? COLONOSCOPY W/ POLYPECTOMY  last one 03/ 2019  ? CYSTOSCOPY N/A 05/17/2018  ? Procedure: CYSTOSCOPY/ REMOVAL OF BLADDER CALCULUS;  Surgeon: [name], MD;  [facility];  Service: Urology;  Laterality: N/A;  ONLY NEEDS 45 MIN  ? FINGER SURGERY  1970s  ? 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 RIGHT MIDDLE FINGER  ? IMPLANTATION BONE ANCHORED HEARING AID Right 2011    '@[facility]'$   ? "no longer have this"  ? LYMPHADENECTOMY Bilateral 08/04/2013  ? Procedure: LYMPHADENECTOMY;  Surgeon: Dutch Gray, MD;  [facility];  Service: Urology;  Laterality: Bilateral;  ? PROSTATE SURGERY    ? ROBOT ASSISTED LAPAROSCOPIC RADICAL PROSTATECTOMY N/A 08/04/2013  ?  Procedure: ROBOTIC ASSISTED LAPAROSCOPIC RADICAL PROSTATECTOMY LEVEL 2;  Surgeon: Dutch Gray, MD;  [facility];  Service: Urology;  Laterality: N/A;  ? ?Past Surgical History:  ?Procedure Laterality Date  ? ANTERIOR CERVICAL DECOMP/DISCECTOMY FUSION  09-19-2010    dr Ronnald Ramp  '@[facility]'$   ? COLONOSCOPY W/ POLYPECTOMY  last one 03/ 2019  ? CYSTOSCOPY N/A 05/17/2018  ? Procedure: CYSTOSCOPY/ REMOVAL OF BLADDER CALCULUS;  Surgeon: [name], MD;  [facility];  Service: Urology;  Laterality: N/A;  ONLY NEEDS 45 MIN  ? FINGER SURGERY  1970s  ? 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 RIGHT MIDDLE FINGER  ? IMPLANTATION BONE ANCHORED HEARING AID Right 2011    '@[facility]'$   ? "no longer have this"  ? LYMPHADENECTOMY Bilateral 08/04/2013  ? Procedure:  LYMPHADENECTOMY;  Surgeon: Dutch Gray, MD;  [facility];  Service: Urology;  Laterality: Bilateral;  ? PROSTATE SURGERY    ? ROBOT ASSISTED LAPAROSCOPIC RADICAL PROSTATECTOMY N/A 08/04/2013  ? Procedure: ROBOTIC ASSISTED LAPAROSCOPIC RADICAL PROSTATECTOMY LEVEL 2;  Surgeon: Dutch Gray, MD;  [facility];  Service: Urology;  Laterality: N/A;  ? ?Past Medical History:  ?Diagnosis Date  ? Anticoagulated   ? xarelto  ? Benign essential tremor 10/12/2019  ? Depression   ? Gait abnormality   ? MILD --- DOES NOT USE CANE  ? Hearing loss   ? 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 History of kidney stones   ? 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 W/ BILATEARAL INTRACEBERAL CONTUSIONS ,OCCIPITAL SKULL FRACTURE-- RESIDUAL MILD MEMORY ISSUES AND RIGHT SIDE HEARING LOSS (PT FELL ON HEAD FROM 6 FT LADDER)  ? Hyperlipidemia   ? Hypertension   ? Mild memory disturbance   ? DUE TO TBI , 10-2008   (per pt mild)  ? Nocturia   ? twice  ? Prostate cancer ([place])   ? UROLOGIST-- DR [name]---  Stage T2a, Gleason 4+3, PSA 5.4,  s/p  radical prostatectomy 2015;   05-14-2018  per pt PSA undetectable  ? Pulmonary embolism, bilateral ([place]) 12/02/2017  ? currently taking xarelto  ? Wears dentures   ? upper  ? Wears glasses   ? Wears hearing aid in both ears   ? ?BP 119/77   Pulse (!) 57   Ht '5\' 8"'$  (1.727 m)   Wt 184 lb 9.6 oz (83.7 kg)   SpO2 94%   BMI 28.07 kg/m?  ? ?Opioid Risk Score:   ?Fall Risk Score:  `1 ? ?Depression screen PHQ 2/9 ? ? ?  08/30/2021  ?  3:01 PM 12/07/2020  ?  9:28 AM 10/09/2020  ?  9:38 AM 07/04/2020  ? 10:49 AM 02/02/2020  ?  9:48 AM 12/30/2019  ?  9:55 AM 12/02/2019  ?  8:43 AM  ?Depression screen PHQ 2/9  ?Decreased Interest 0 0 0 0 0 0 0  ?Down, Depressed, Hopeless 0 0 0 0 0 0 0  ?PHQ - 2 Score 0 0 0 0 0 0 0  ?  ? ? ?Review of Systems  ?Constitutional: Negative.   ?HENT: Negative.    ?Eyes: Negative.   ?Respiratory: Negative.    ?Cardiovascular: Negative.   ?Gastrointestinal: Negative.   ?Endocrine: Negative.    ?Genitourinary: Negative.   ?Musculoskeletal:  Positive for back pain.  ?Skin: Negative.   ?Allergic/Immunologic: Negative.   ?Neurological: Negative.   ?Hematological: Negative.   ?Psychiatric/Behavioral: Negative.    ?All other systems reviewed and are negative. ? ?   ?  Objective:  ? Physical Exam ?Vitals and nursing note reviewed.  ?Constitutional:   ?   Appearance: He is normal weight.  ?HENT:  ?   Head: Normocephalic and atraumatic.  ?   Ears:  ?   Comments: History of cochlear implant ?Neck:  ?   Comments: Cervical spine range of motion mildly reduced has 75% range flexion extension lateral bending and rotation.  There is tenderness palpation in the upper trapezius area but none along the cervical paraspinals. ?Musculoskeletal:  ?   Comments: Tenderness to palpation lumbosacral junction and paraspinal area.  No tenderness in the upper lumbar region.  ?Neurological:  ?   Mental Status: He is alert.  ?   Comments: Motor strength is 4/5 in the right deltoid bicep tricep grip 5/5 in the left deltoid bicep tricep grip this is unchanged compared to prior ?5/5 bilateral hip flexor knee extensor ankle dorsiflexor. ?Ambulates without assistive device no evidence of toe drag or knee instability  ? ? ? ? ? ?   ?Assessment & Plan:  ?1.  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  He really feels like he needs to be back on something.  We discussed that there are other options including But[place] patch.  Reviewed his EKG no evidence of QTc prolongation.  Discussed pros and cons with patient and wife including less regulated medication lesser chance for addiction.  Lower incidence of cognitive dysfunction as well as lower incidence of constipation.  We will start him at the higher patch 20 mcg/h see him back in 1 month nurse practitioner visit.   If he is not doing well with this medication may consider other options including with codeine or oxycodone. ? ?

## 2021-08-30 NOTE — Patient Instructions (Signed)
Buprenorphine Patches ?What is this medication? ?BUPRENORPHINE (byoo pre NOR feen) treats severe, chronic pain. It is prescribed when other pain medications have not worked or cannot be tolerated. It works by blocking pain signals in the brain. It belongs to a group of medications called opioids. This medication is long-acting. Do not use it to treat sudden pain. ?This medicine may be used for other purposes; ask your health care provider or pharmacist if you have questions. ?COMMON BRAND NAME(S): Butrans ?What should I tell my care team before I take this medication? ?They need to know if you have any of these conditions: ?Brain tumor ?Drug abuse or addiction ?Gallbladder disease ?Head injury ?Heart disease ?If you often drink alcohol ?Irregular heartbeat or rhythm ?Liver disease ?Low adrenal gland function ?Lung disease, asthma, or breathing problem ?Pancreatic disease ?Seizures ?Stomach or intestine problems ?Taken an MAOI like Marplan, Nardil, or Parnate in the last 14 days ?An unusual or allergic reaction to buprenorphine, other medications, foods, dyes, or preservatives ?Pregnant or trying to get pregnant ?Breast-feeding ?How should I use this medication? ?This medication is for external use only. Apply the patch to your skin. Select a clean, dry area of skin on your upper outer arm, upper chest, upper back, or the side of the chest. The upper back is a good spot to put the patch on people who are confused because it will be hard for them to remove the patch. Do not apply the patch to oily, broken, burned, cut, or irritated skin. Use only water to clean the area. Do not use soap or alcohol to clean the skin because this can increase the effects of the medication. If the area is hairy, clip the hair with scissors, but do not shave. Do not cut or damage the patch. A cut or damaged patch can be very dangerous because you may get too much medication. ?Take the patch out of its wrapper, and take off the protective  strip over the sticky part. Do not use a patch if the packaging or backing is damaged. Do not touch the sticky part with your fingers. Press the sticky surface to the skin using the palm of your hand. Press the patch to the skin for 15 seconds. Wash your hands at once with soap and water. ?Keep patches far away from children. Do not let children see you apply the patch and do not apply it where children can see it. Do not call the patch a sticker, tattoo, or bandage, as this could encourage the child to mimic your actions. Used patches still contain medication. Children or pets can have serious side effects or die from putting used patches in their mouth or on their bodies. ?Take off the old patch before putting on a new patch. Apply each new patch to a different area of skin. If a patch comes off or causes irritation, remove it and apply a new patch to different site. If the edges of the patch start to loosen, apply first aid tape to the edges of the patch. If problems with the patch not sticking continue, cover the patch with a see-through adhesive dressing (like Bioclusive or Tegaderm). Never cover the patch with any other bandage or tape. ?A special MedGuide will be given to you by the pharmacist with each prescription and refill. Be sure to read this information carefully each time. ?This medication comes with INSTRUCTIONS FOR USE. Ask your pharmacist for directions on how to use this medication. Read the information carefully. Talk to  your pharmacist or care team if you have questions. ?Talk to your care team about the use of this medication in children. Special care may be needed. ?Overdosage: If you think you have taken too much of this medicine contact a poison control center or emergency room at once. ?NOTE: This medicine is only for you. Do not share this medicine with others. ?What if I miss a dose? ?If you miss a dose, use it as soon as you can. If it is almost time for your next dose, use only that  dose. Do not use double or extra doses. ?What may interact with this medication? ?Do not take this medication with any of the following: ?Cisapride ?Dronedarone ?Pimozide ?Safinamide ?[place] ?Thioridazine ?This medication may also interact with the following: ?Alcohol ?Antihistamines for allergy, cough and cold ?Certain antibiotics like clarithromycin, erythromycin ?Certain antivirals for hepatitis or HIV ?Certain medications for anxiety or sleep ?Certain medications for bladder problems like oxybutynin, tolterodine ?Certain medications for depression or psychotic disorders ?Certain medications for fungal infections like fluconazole, ketoconazole, posaconazole ?Certain medications for migraine headache like almotriptan, eletriptan, frovatriptan, naratriptan, rizatriptan, sumatriptan, zolmitriptan ?Certain medications for nausea or vomiting like dolasetron, ondansetron, palonosetron ?Certain medications for Parkinson's disease like benztropine, trihexyphenidyl ?Certain medications for seizures like carbamazepine, phenobarbital, phenytoin ?Certain medications for stomach problems like dicyclomine, hyoscyamine ?Certain medications for travel sickness like scopolamine ?Diuretics ?General anesthetics like halothane, isoflurane, methoxyflurane, propofol ?Ipratropium ?Linezolid ?MAOIs like Carbex, Eldepryl, Marplan, Nardil, and Parnate ?Medications that relax muscles like cyclobenzaprine, metaxalone ?Methylene blue (injected into a vein) ?Other medications that prolong the QT interval (cause an abnormal heart rhythm) ?Other narcotic medications for pain or cough ?Phenothiazines like chlorpromazine, prochlorperazine ?Rifampin ?This list may not describe all possible interactions. Give your health care provider a list of all the medicines, herbs, non-prescription drugs, or dietary supplements you use. Also tell them if you smoke, drink alcohol, or use illegal drugs. Some items may interact with your medicine. ?What  should I watch for while using this medication? ?Tell your care team if your pain does not go away, if it gets worse, or if you have new or a different type of pain. You may develop tolerance to this medication. Tolerance means that you will need a higher dose of the medication for pain relief. Tolerance is normal and is expected if you take this medication for a long time. ?Do not suddenly stop taking your medication because you may develop a severe reaction. Your body becomes used to the medication. This does NOT mean you are addicted. Addiction is a behavior related to getting and using a medication for a nonmedical reason. If you have pain, you have a medical reason to take pain medication. Your care team will tell you how much medication to take. If your care team wants you to stop the medication, the dose will be slowly lowered over time to avoid any side effects. ?If you take other medications that also cause drowsiness such as other narcotic pain medications, benzodiazepines, or other medications for sleep, you may have more side effects. Give your care team a list of all medications you use. They will tell you how much medication to take. Do not take more medication than directed. Call emergency services if you have trouble breathing or are unusually tired or sleepy. ?Talk to your care team about naloxone and how to get it. Naloxone is an emergency medication used for an opioid overdose. An overdose can happen if you take too much opioid. It can  also happen if an opioid is taken with some other medications or substances, such as alcohol. Know the symptoms of an overdose, such as trouble breathing, being unusually tired or sleepy, or not being able to respond or wake up. Make sure to tell caregivers and close contacts where it is stored. Make sure they know how to use it. After naloxone is given, you must call emergency services. Naloxone is a temporary treatment. Repeat doses may be needed. ?You may get  drowsy or dizzy. Do not drive, use machinery, or do anything that needs mental alertness until you know how this medication affects you. Do not stand up or sit up quickly, especially if you are an older patient

## 2021-09-02 ENCOUNTER — Telehealth: Payer: Self-pay | Admitting: *Deleted

## 2021-09-02 MED ORDER — OXYCODONE HCL 5 MG PO TABS: 5.0000 mg | ORAL_TABLET | Freq: Three times a day (TID) | ORAL | 0 refills | Status: DC | PRN

## 2021-09-02 NOTE — Addendum Note (Signed)
Addended by: Charlett Blake on: 09/02/2021 08:36 AM ? ? Modules accepted: Orders ? ?

## 2021-09-02 NOTE — Telephone Encounter (Addendum)
Prior auth submitted for buprenorphine patches 75mg/hr patches.  BUPRENORPHIN DIS 20MCG/HR, use as directed, is approved for use beyond a 7-day supply safetylimit for new opioid users through 10/02/2021. I let Mrs HChrostowskiknow that we sent over the Rx for a week supply of oxycodone 5 mg #30 until the patches were approved. We received rapid approval for the patches so I have let the pharmacy know [facility] that the approval was received and they should get the patches and not the oxycodone unless there is further problem.  The pharmacist did let me know that the approval went through but the medication cost is $100. I told him if there is a problem with that for the [facility] then he could release the oxycodone for them until Dr KLetta Patecould address the cost of the Buprenorphine.  ?

## 2021-09-02 NOTE — Telephone Encounter (Signed)
Mrs Portal called and they were not able to get the buprenorphine patch Friday due to the insurance needing prior auth.  Can Dr Letta Pate send in a week of the oxycodone to cover him while we wait on a determination on the patches. They may not be approved. ?

## 2021-09-03 ENCOUNTER — Ambulatory Visit: Payer: Medicare Other | Admitting: Physical Medicine & Rehabilitation

## 2021-09-10 ENCOUNTER — Telehealth: Payer: Self-pay

## 2021-09-10 MED ORDER — OXYCODONE HCL 5 MG PO TABS: 5.0000 mg | ORAL_TABLET | Freq: Three times a day (TID) | ORAL | 0 refills | Status: DC | PRN

## 2021-09-10 NOTE — Telephone Encounter (Signed)
Notified oxycodone sent in. ?

## 2021-09-10 NOTE — Telephone Encounter (Signed)
Patient wife called states Butrans patch is $100 and not affordable. Patients prefers to have the Oxycodone 5 mg, needs refill. ?

## 2021-09-24 ENCOUNTER — Ambulatory Visit: Payer: Medicare Other | Admitting: Neurology

## 2021-09-24 ENCOUNTER — Encounter: Payer: Self-pay | Admitting: Neurology

## 2021-09-24 VITALS — BP 99/63 | HR 65 | Ht 69.0 in | Wt 178.5 lb

## 2021-09-24 DIAGNOSIS — R413 Other amnesia: Secondary | ICD-10-CM | POA: Diagnosis not present

## 2021-09-24 DIAGNOSIS — G250 Essential tremor: Secondary | ICD-10-CM

## 2021-09-24 NOTE — Progress Notes (Signed)
? ?PATIENT: Fred [name] ?DOB: 12-02-1950 ? ?REASON FOR VISIT: follow up for tremor, memory disturbance ?HISTORY FROM: patient, wife ?PRIMARY NEUROLOGIST: Dr. Rexene [name] ? ?HISTORY OF PRESENT ILLNESS: ?Today 09/24/21 ?Fred [name] is here today for follow-up. MMSE 23/30, no change. Wife thinks memory is slightly worse, relies on his wife for more day to day questioning. He very rarely drives, [place] He sees pain management, takes oxycodone. Wife makes weekly pill boxes, he takes them, but need reminders. He mows the lawn. Sleeps well. Denies dreams. No falls. Watches TV often, crime shows. Tremor in both hands, intermittently, has improved. Rarely takes propranolol for tremor. Takes Aricept in AM, Namenda 10 mg twice daily.  ? ?Update 10/30/2020 SS: Fred [name] is a 71 year old male with history of tremor to both hands, memory disturbance. MMSE 23/30 today.  Remains on Aricept 10 mg, not sure if taking AM/PM, PCP just increased Namenda 10 mg daily.  Reports feeling less fatigued, takes propanolol twice weekly as needed for tremor.  Tremor is both hands, with action. With memory, may forget what he went into a room for.  His wife does most of the driving, he only does short distances.  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 He mows the lawn. Sees pain management for chronic back pain, on oxycodone.  No falls, he does walk with a stagger, but has been that way. Has dreams at night of coal mining.  Here today with his wife. They deny any concerns.  ? ?Update 04/18/2020 SS: Fred [name] is a 71 year old male with history of mild memory disturbance, noted tremor to both hands in early 2021.  Currently taking propanolol 40 mg as needed for tremor, Aricept 10 mg daily, Namenda 5 mg daily.  For the last 1 month, has noted fatigue, heart rate in the 40s.  Around that time, Namenda 5 mg was added, propanolol was switched to as needed.  1 week ago, amlodipine 5 mg  daily was started.  His wife usually gives him propanolol 3-4 times a week when she notes tremor.  Tremors in both hands, greater in the right, most notable with eating.  Memory is overall stable.  Does little driving, mostly because he and his wife are always together, he navigates.  More still has benign forgetfulness.  Has been more sleepy, sleeping 14 hours a day.  Has noted a few headaches.  Is on oxycodone for chronic back pain.  Presents today for evaluation accompanied by his wife.  MMSE 25/30 today. ? ?HISTORY  ?10/12/2019 Dr. Jannifer [name]: Fred [name] is a 71 year old right-handed white male with a history of a mild memory disturbance.  The patient was last seen through this office about 2 years ago, he has been maintained on Aricept to 10 mg at night.  He has tolerated the medication well.  Over the last 2 years he has had some slight increased problems with forgetfulness.  The is still able to operate a motor vehicle, he denies any difficulty with safety issues or with directions.  He indicates that his wife helps him out with keeping up with medications and appointments, she does the finances and always has.  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  He has a very strong family history of Alzheimer's disease with dementia affecting his mother and a brother and a sister.  Over the last 6 months he  has noted some intermittent tremor that affects both hands equally.  The tremor is present with activity of the hands, not present at rest.  He denies any tremor affecting the jaw or the head or neck or any vocal tremor.  He is not having a lot of troubles feeding himself or performing handwriting.  The patient has had some issues with sleeping quite a bit during the day, he will go to bed and sleep 12 hours, sometimes up to 15 hours a day.  The patient has a good energy level once he is awake during the day and he remains active.  He reports some  bilateral foot numbness following a frostbite injury many years ago.  He does note some slight gait instability, he has not had any falls.  He reports no other focal numbness or weakness of the extremities.  He is sent to this office for further evaluation.  ? ?REVIEW OF SYSTEMS: Out of a complete 14 system review of symptoms, the patient complains only of the following symptoms, and all other reviewed systems are negative. ? ?See HPI ? ?ALLERGIES: ?Allergies  ?Allergen Reactions  ? Nsaids   ?  Other reaction(s): chronic kidney disease  ? Percodan [Oxycodone-Aspirin] Hives and Nausea And Vomiting  ?  Per wife " this was 40 yrs ago"  Approx.  1970s  ? Pregabalin Other (See Comments)  ?  Falls asleep while talking to people ?Other reaction(s): significant drowsiness  ? Sulfa Antibiotics   ?  Other reaction(s): Unknown  ? ? ?HOME MEDICATIONS: ?Outpatient Medications Prior to Visit  ?Medication Sig Dispense Refill  ? amLODipine (NORVASC) 5 MG tablet Take 5 mg by mouth daily.    ? atorvastatin (LIPITOR) 20 MG tablet Take 20 mg by mouth daily.    ? donepezil (ARICEPT) 10 MG tablet Take 10 mg by mouth daily.    ? DULoxetine (CYMBALTA) 60 MG capsule Take 60 mg by mouth 2 (two) times daily.     ? lisinopril (ZESTRIL) 20 MG tablet Take 20 mg by mouth 2 (two) times daily.    ? memantine (NAMENDA) 10 MG tablet Take 10 mg by mouth 2 (two) times daily.    ? oxyCODONE (ROXICODONE) 5 MG immediate release tablet Take 1 tablet (5 mg total) by mouth every 8 (eight) hours as needed for severe pain. 90 tablet 0  ? propranolol (INDERAL) 40 MG tablet Take 40 mg by mouth as needed for tremors.    ? albuterol (PROAIR HFA) 108 (90 Base) MCG/ACT inhaler Inhale 1-2 puffs into the lungs every 6 (six) hours as needed for wheezing or shortness of breath. 3 Inhaler 1  ? mupirocin ointment (BACTROBAN) 2 % SMARTSIG:1 Application Topical 2-3 Times Daily    ? polyethylene glycol-electrolytes (NULYTELY) 420 g solution Take by mouth as directed.     ? ?No facility-administered medications prior to visit.  ? ? ?PAST MEDICAL HISTORY: ?Past Medical History:  ?Diagnosis Date  ? Anticoagulated   ? xarelto  ? Benign essential tremor 10/12/2019  ? Depression   ? Gait abnormality   ? MILD --- DOES NOT USE CANE  ? Hearing loss   ? 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 History of kidney stones   ? 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 W/ BILATEARAL INTRACEBERAL CONTUSIONS ,OCCIPITAL SKULL FRACTURE-- RESIDUAL MILD MEMORY ISSUES AND RIGHT SIDE HEARING LOSS (PT FELL ON HEAD FROM 6 FT LADDER)  ? Hyperlipidemia   ? Hypertension   ? Mild  memory disturbance   ? DUE TO TBI , 10-2008   (per pt mild)  ? Nocturia   ? twice  ? Prostate cancer ([place])   ? UROLOGIST-- DR [name]---  Stage T2a, Gleason 4+3, PSA 5.4,  s/p  radical prostatectomy 2015;   05-14-2018  per pt PSA undetectable  ? Pulmonary embolism, bilateral ([place]) 12/02/2017  ? currently taking xarelto  ? Wears dentures   ? upper  ? Wears glasses   ? Wears hearing aid in both ears   ? ? ?PAST SURGICAL HISTORY: ?Past Surgical History:  ?Procedure Laterality Date  ? ANTERIOR CERVICAL DECOMP/DISCECTOMY FUSION  09-19-2010    dr Ronnald Ramp  '@[facility]'$   ? COLONOSCOPY W/ POLYPECTOMY  last one 03/ 2019  ? CYSTOSCOPY N/A 05/17/2018  ? Procedure: CYSTOSCOPY/ REMOVAL OF BLADDER CALCULUS;  Surgeon: [name], MD;  Location: [facility];  Service: Urology;  Laterality: N/A;  ONLY NEEDS 45 MIN  ? FINGER SURGERY  1970s  ? 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 RIGHT MIDDLE FINGER  ? IMPLANTATION BONE ANCHORED HEARING AID Right 2011    '@[facility]'$   ? "no longer have this"  ? LYMPHADENECTOMY Bilateral 08/04/2013  ? Procedure: LYMPHADENECTOMY;  Surgeon: Dutch Gray, MD;  Location: [facility];  Service: Urology;  Laterality: Bilateral;  ? PROSTATE SURGERY    ? ROBOT ASSISTED LAPAROSCOPIC RADICAL PROSTATECTOMY N/A 08/04/2013  ? Procedure: ROBOTIC ASSISTED LAPAROSCOPIC RADICAL PROSTATECTOMY LEVEL 2;  Surgeon: Dutch Gray, MD;  Location: [facility];   Service: Urology;  Laterality: N/A;  ? ? ?FAMILY HISTORY: ?Family History  ?Problem Relation Age of Onset  ? Stroke Mother   ? Dementia Mother   ? Heart disease Father   ? Stroke Father   ?     heat stroke  ? K

## 2021-09-24 NOTE — Patient Instructions (Signed)
Continue current medications ?Recommend against independent driving, review the drivers rehabilitation evaluation  ?Will see you back in 1 year  ?Try to increase your activity  ?

## 2021-10-07 ENCOUNTER — Encounter: Payer: Medicare Other | Attending: Physical Medicine & Rehabilitation | Admitting: Registered Nurse

## 2021-10-07 VITALS — BP 116/78 | HR 65 | Temp 97.5°F | Ht 69.0 in | Wt 179.8 lb

## 2021-10-07 DIAGNOSIS — Z5181 Encounter for therapeutic drug level monitoring: Secondary | ICD-10-CM | POA: Insufficient documentation

## 2021-10-07 DIAGNOSIS — G8929 Other chronic pain: Secondary | ICD-10-CM | POA: Diagnosis not present

## 2021-10-07 DIAGNOSIS — M546 Pain in thoracic spine: Secondary | ICD-10-CM | POA: Insufficient documentation

## 2021-10-07 DIAGNOSIS — M545 Low back pain, unspecified: Secondary | ICD-10-CM | POA: Insufficient documentation

## 2021-10-07 DIAGNOSIS — G894 Chronic pain syndrome: Secondary | ICD-10-CM | POA: Insufficient documentation

## 2021-10-07 DIAGNOSIS — Z79891 Long term (current) use of opiate analgesic: Secondary | ICD-10-CM | POA: Insufficient documentation

## 2021-10-07 MED ORDER — OXYCODONE HCL 5 MG PO TABS: 5.0000 mg | ORAL_TABLET | Freq: Three times a day (TID) | ORAL | 0 refills | Status: DC | PRN

## 2021-10-07 NOTE — Progress Notes (Signed)
Subjective:    Patient ID: Fred [name], male    DOB: 05-Aug-1950, 71 y.o.   MRN: [id number]  HPI: Fred [name] is a 71 y.o. male who returns for follow up appointment for chronic pain and medication refill. He states his pain is located in his upper- lower back pain. He rates his pain 3. His current exercise regime is walking and performing stretching exercises.  Mr. [place] Morphine equivalent is 22.50 MME.   Last Oral Swab was Performed on 12/07/2021, it was consistent.    Pain Inventory Average Pain 3 Pain Right Now 3 My pain is stabbing  In the last 24 hours, has pain interfered with the following? General activity 3 Relation with others 5 Enjoyment of life 6 What TIME of day is your pain at its worst? evening Sleep (in general) Fair  Pain is worse with: walking, bending, and some activites Pain improves with: medication Relief from Meds: 5  Family History  Problem Relation Age of Onset   Stroke Mother    Dementia Mother    Heart disease Father    Stroke Father        heat stroke   Kidney disease Brother    Dementia Brother    Social History   Socioeconomic History   Marital status: Married    Spouse name: Not on file   Number of children: 6   Years of education: 12+   Highest education level: Not on file  Occupational History   Occupation: Retired  Tobacco Use   Smoking status: Former    Packs/day: 1.00    Years: 30.00    Pack years: 30.00    Types: Cigarettes    Quit date: 05/25/2001    Years since quitting: 20.3   Smokeless tobacco: Former    Types: Chew, Snuff    Quit date: 09/22/2001  Vaping Use   Vaping Use: Never used  Substance and Sexual Activity   Alcohol use: Not Currently   Drug use: No   Sexual activity: Not on file  Other Topics Concern   Not on file  Social History Narrative   Lives with wife   Caffeine use: Coffee daily (1-2 per day)   Some soda   Right handed    Social Determinants of Health   Financial Resource  Strain: Not on file  Food Insecurity: Not on file  Transportation Needs: Not on file  Physical Activity: Not on file  Stress: Not on file  Social Connections: Not on file   Past Surgical History:  Procedure Laterality Date   ANTERIOR CERVICAL DECOMP/DISCECTOMY FUSION  09-19-2010    dr Ronnald Ramp  '@[facility]'$    COLONOSCOPY W/ POLYPECTOMY  last one 03/ 2019   CYSTOSCOPY N/A 05/17/2018   Procedure: CYSTOSCOPY/ REMOVAL OF BLADDER CALCULUS;  Surgeon: [name], MD;  [facility];  Service: Urology;  Laterality: N/A;  ONLY NEEDS 45 MIN   FINGER SURGERY  1970s   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 RIGHT MIDDLE FINGER   IMPLANTATION BONE ANCHORED HEARING AID Right 2011    '@[facility]'$    "no longer have this"   LYMPHADENECTOMY Bilateral 08/04/2013   Procedure: LYMPHADENECTOMY;  Surgeon: Dutch Gray, MD;  [facility];  Service: Urology;  Laterality: Bilateral;   PROSTATE SURGERY     ROBOT ASSISTED LAPAROSCOPIC RADICAL PROSTATECTOMY N/A 08/04/2013   Procedure: ROBOTIC ASSISTED LAPAROSCOPIC RADICAL PROSTATECTOMY LEVEL 2;  Surgeon: Dutch Gray, MD;  [facility];  Service: Urology;  Laterality: N/A;  Past Surgical History:  Procedure Laterality Date   ANTERIOR CERVICAL DECOMP/DISCECTOMY FUSION  09-19-2010    dr Ronnald Ramp  '@[facility]'$    COLONOSCOPY W/ POLYPECTOMY  last one 03/ 2019   CYSTOSCOPY N/A 05/17/2018   Procedure: CYSTOSCOPY/ REMOVAL OF BLADDER CALCULUS;  Surgeon: [name], MD;  [facility];  Service: Urology;  Laterality: N/A;  ONLY NEEDS 45 MIN   FINGER SURGERY  1970s   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 RIGHT MIDDLE FINGER   IMPLANTATION BONE ANCHORED HEARING AID Right 2011    '@[facility]'$    "no longer have this"   LYMPHADENECTOMY Bilateral 08/04/2013   Procedure: LYMPHADENECTOMY;  Surgeon: Dutch Gray, MD;  [facility];  Service: Urology;  Laterality: Bilateral;   PROSTATE SURGERY     ROBOT ASSISTED LAPAROSCOPIC RADICAL PROSTATECTOMY  N/A 08/04/2013   Procedure: ROBOTIC ASSISTED LAPAROSCOPIC RADICAL PROSTATECTOMY LEVEL 2;  Surgeon: Dutch Gray, MD;  [facility];  Service: Urology;  Laterality: N/A;   Past Medical History:  Diagnosis Date   Anticoagulated    xarelto   Benign essential tremor 10/12/2019   Depression    Gait abnormality    MILD --- DOES NOT USE CANE   Hearing loss    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   History of kidney stones    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) 97.5 F (36.4 C) (Oral)   Ht '5\' 9"'$  (1.753 m)   Wt 179 lb 12.8 oz (81.6 kg)   SpO2 94%   BMI 26.55 kg/m   Opioid Risk Score:   Fall Risk Score:  `1  Depression screen [facility] 2/9     10/07/2021    9:25 AM 08/30/2021    3:01 PM 12/07/2020    9:28 AM 10/09/2020    9:38 AM 07/04/2020   10:49 AM 02/02/2020    9:48 AM 12/30/2019    9:55 AM  Depression screen PHQ 2/9  Decreased Interest 0 0 0 0 0 0 0  Down, Depressed, Hopeless 0 0 0 0 0 0 0  PHQ - 2 Score 0 0 0 0 0 0 0     Review of Systems  Musculoskeletal:  Positive for back pain and neck pain.  All other systems reviewed and are negative.     Objective:   Physical Exam Vitals and nursing note reviewed.  Constitutional:      Appearance: Normal appearance.  Cardiovascular:     Rate and Rhythm: Normal rate and regular rhythm.     Pulses: Normal  pulses.     Heart sounds: Normal heart sounds.  Pulmonary:     Effort: Pulmonary effort is normal.     Breath sounds: Normal  breath sounds.  Musculoskeletal:     Cervical back: Normal range of motion and neck supple.     Comments: Normal Muscle Bulk and Muscle Testing Reveals:  Upper Extremities: Full ROM and Muscle Strength 5/5 Thoracic Paraspinal Tenderness: T-1-T-3 Lumbar Paraspinal Tenderness: L-3-L-5 Lower Extremities: Full ROM and Muscle Strength 5/5 Arises from Chair with ease Narrow Based  Gait     Skin:    General: Skin is warm and dry.  Neurological:     Mental Status: He is alert and oriented to person, place, and time.  Psychiatric:        Mood and Affect: Mood normal.        Behavior: Behavior normal.         Assessment & Plan:  1.Cervical Postlaminectomy: Continue HEP and Continue current medication regime. 10/07/2021. 2. Cervicalgia/ Cervical Radiculitis: No complaints today. Continue current medication regime. Continue to Monitor. 10/07/2021 3. Chronic Bilateral Shoulder Pain: No complaints today. Continue HEP as Tolerated. Continue to monitor.10/07/2021 4. L2 compression fracture/ Chronic Bilateral Low Back Pain without Sciatica: Continue to Monitor:10/07/2021  Refilled: Oxycodone 7.5  mg  one tablet every 6 hours as needed for moderate pain. # 120. Marland Kitchen  We will continue the opioid monitoring program, this consists of regular clinic visits, examinations, urine drug screen, pill counts as well as use of [place] Controlled Substance Reporting system. A 12 month History has been reviewed on the [place] on 10/07/2021.  5. Polyarthralgia: No complaints today. Continue to Monitor. 10/07/2021 6. Chronic Bilateral Thoracic Pain: No complaints today. Continue HEP as Tolerated. Continue  Current medication regimen. Continue to Monitor. 10/07/2021   F/U in 1 month

## 2021-10-09 ENCOUNTER — Telehealth: Payer: Self-pay

## 2021-10-09 MED ORDER — OXYCODONE HCL 5 MG PO TABS: 5.0000 mg | ORAL_TABLET | Freq: Three times a day (TID) | ORAL | 0 refills | Status: DC | PRN

## 2021-10-09 NOTE — Telephone Encounter (Signed)
Wife called stating that [facility] [place] [facility] but [facility] [facility]. Cancelled [facility] [place]. [facility] [place] [facility] [address].

## 2021-10-09 NOTE — Telephone Encounter (Signed)
PMP was Reviewed. Oxycodone e-scribed to pharmacy.  Fred [name] is aware and verbalizes understanding.

## 2021-10-10 ENCOUNTER — Telehealth: Payer: Self-pay

## 2021-10-10 MED ORDER — OXYCODONE HCL 5 MG PO TABS: 5.0000 mg | ORAL_TABLET | Freq: Three times a day (TID) | ORAL | 0 refills | Status: DC | PRN

## 2021-10-10 NOTE — Telephone Encounter (Signed)
PMP was Reviewed.  New Prescription of Oxycodone e-scribed today.  Fred [name] is aware of the above and verbalizes understanding.

## 2021-10-10 NOTE — Telephone Encounter (Signed)
Mr. Pascal is going on vacation today. He would like to pick up the Oxycodone 5 MG today. But he cannot because of the "do not fill until 10/16/2021"  note.   Can you send a new script without the hold note?   Call back phone [phone].

## 2021-10-12 ENCOUNTER — Encounter: Payer: Self-pay | Admitting: Registered Nurse

## 2021-11-06 ENCOUNTER — Encounter: Payer: Self-pay | Admitting: Registered Nurse

## 2021-11-06 ENCOUNTER — Encounter: Payer: Medicare Other | Attending: Physical Medicine & Rehabilitation | Admitting: Registered Nurse

## 2021-11-06 VITALS — BP 144/91 | HR 52 | Ht 69.0 in | Wt 176.4 lb

## 2021-11-06 DIAGNOSIS — G894 Chronic pain syndrome: Secondary | ICD-10-CM

## 2021-11-06 DIAGNOSIS — Z5181 Encounter for therapeutic drug level monitoring: Secondary | ICD-10-CM | POA: Diagnosis not present

## 2021-11-06 DIAGNOSIS — M542 Cervicalgia: Secondary | ICD-10-CM

## 2021-11-06 DIAGNOSIS — M545 Low back pain, unspecified: Secondary | ICD-10-CM

## 2021-11-06 DIAGNOSIS — M961 Postlaminectomy syndrome, not elsewhere classified: Secondary | ICD-10-CM

## 2021-11-06 DIAGNOSIS — M5412 Radiculopathy, cervical region: Secondary | ICD-10-CM | POA: Diagnosis not present

## 2021-11-06 DIAGNOSIS — M546 Pain in thoracic spine: Secondary | ICD-10-CM | POA: Diagnosis not present

## 2021-11-06 DIAGNOSIS — G8929 Other chronic pain: Secondary | ICD-10-CM | POA: Diagnosis not present

## 2021-11-06 DIAGNOSIS — M4802 Spinal stenosis, cervical region: Secondary | ICD-10-CM

## 2021-11-06 DIAGNOSIS — Z79891 Long term (current) use of opiate analgesic: Secondary | ICD-10-CM | POA: Diagnosis not present

## 2021-11-06 MED ORDER — OXYCODONE HCL 5 MG PO TABS: 5.0000 mg | ORAL_TABLET | Freq: Three times a day (TID) | ORAL | 0 refills | Status: DC | PRN

## 2021-11-06 NOTE — Progress Notes (Signed)
Subjective:    Patient ID: Fred [name], male    DOB: 1950/08/05, 72 y.o.   MRN: [id number]  HPI: Fred [name] is a 71 y.o. male who returns for follow up appointment for chronic pain and medication refill. He states his pain is located in his neck  and upper- lower back pain. She rates her pain 4. Her current exercise regime is walking and performing stretching exercises.  Fred [name] Morphine equivalent is 45.00  MME.   UDS ordered today.     Pain Inventory Average Pain 5 Pain Right Now 4 My pain is intermittent, stabbing, and aching  LOCATION OF PAIN  back  BOWEL Number of stools per week: 7   BLADDER Normal    Mobility walk without assistance how many minutes can you walk? 20 ability to climb steps?  yes do you drive?  yes Do you have any goals in this area?  no  Function retired  Neuro/Psych No problems in this area  Prior Studies Any changes since last visit?  no  Physicians involved in your care Any changes since last visit?  no   Family History  Problem Relation Age of Onset   Stroke Mother    Dementia Mother    Heart disease Father    Stroke Father        heat stroke   Kidney disease Brother    Dementia Brother    Social History   Socioeconomic History   Marital status: Married    Spouse name: Not on file   Number of children: 6   Years of education: 12+   Highest education level: Not on file  Occupational History   Occupation: Retired  Tobacco Use   Smoking status: Former    Packs/day: 1.00    Years: 30.00    Total pack years: 30.00    Types: Cigarettes    Quit date: 05/25/2001    Years since quitting: 20.4   Smokeless tobacco: Former    Types: Chew, Snuff    Quit date: 09/22/2001  Vaping Use   Vaping Use: Never used  Substance and Sexual Activity   Alcohol use: Not Currently   Drug use: No   Sexual activity: Not on file  Other Topics Concern   Not on file  Social History Narrative   Lives with wife   Caffeine use:  Coffee daily (1-2 per day)   Some soda   Right handed    Social Determinants of Health   Financial Resource Strain: Not on file  Food Insecurity: Not on file  Transportation Needs: Not on file  Physical Activity: Not on file  Stress: Not on file  Social Connections: Not on file   Past Surgical History:  Procedure Laterality Date   ANTERIOR CERVICAL DECOMP/DISCECTOMY FUSION  09-19-2010    dr Ronnald Ramp  '@[facility]'$    COLONOSCOPY W/ POLYPECTOMY  last one 03/ 2019   CYSTOSCOPY N/A 05/17/2018   Procedure: CYSTOSCOPY/ REMOVAL OF BLADDER CALCULUS;  Surgeon: [name], MD;  [facility];  Service: Urology;  Laterality: N/A;  ONLY NEEDS 45 MIN   FINGER SURGERY  1970s   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 RIGHT MIDDLE FINGER   IMPLANTATION BONE ANCHORED HEARING AID Right 2011    '@[facility]'$    "no longer have this"   LYMPHADENECTOMY Bilateral 08/04/2013   Procedure: LYMPHADENECTOMY;  Surgeon: Dutch Gray, MD;  [facility];  Service: Urology;  Laterality: Bilateral;   PROSTATE SURGERY  ROBOT ASSISTED LAPAROSCOPIC RADICAL PROSTATECTOMY N/A 08/04/2013   Procedure: ROBOTIC ASSISTED LAPAROSCOPIC RADICAL PROSTATECTOMY LEVEL 2;  Surgeon: Dutch Gray, MD;  [facility];  Service: Urology;  Laterality: N/A;   Past Medical History:  Diagnosis Date   Anticoagulated    xarelto   Benign essential tremor 10/12/2019   Depression    Gait abnormality    MILD --- DOES NOT USE CANE   Hearing loss    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   History of kidney stones    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 ,OCCIPITAL SKULL FRACTURE-- RESIDUAL MILD MEMORY ISSUES AND RIGHT SIDE HEARING LOSS (PT FELL ON HEAD FROM 6 FT LADDER)   Hyperlipidemia    Hypertension    Mild memory disturbance    DUE TO TBI , 10-2008   (per pt mild)   Nocturia    twice   Prostate cancer ([place])    UROLOGIST-- DR [name]---  Stage T2a, Gleason 4+3, PSA 5.4,  s/p  radical  prostatectomy 2015;   05-14-2018  per pt PSA undetectable   Pulmonary embolism, bilateral ([place]) 12/02/2017   currently taking xarelto   Wears dentures    upper   Wears glasses    Wears hearing aid in both ears    BP (!) 144/91   Pulse (!) 52   Ht '5\' 9"'$  (1.753 m)   Wt 176 lb 6.4 oz (80 kg)   SpO2 98%   BMI 26.05 kg/m   Opioid Risk Score:   Fall Risk Score:  `1  Depression screen [facility] 2/9     11/06/2021    3:03 PM 10/07/2021    9:25 AM 08/30/2021    3:01 PM 12/07/2020    9:28 AM 10/09/2020    9:38 AM 07/04/2020   10:49 AM 02/02/2020    9:48 AM  Depression screen PHQ 2/9  Decreased Interest 0 0 0 0 0 0 0  Down, Depressed, Hopeless 0 0 0 0 0 0 0  PHQ - 2 Score 0 0 0 0 0 0 0     Review of Systems  Constitutional: Negative.   HENT: Negative.    Eyes: Negative.   Respiratory: Negative.    Cardiovascular: Negative.   Gastrointestinal: Negative.   Endocrine: Negative.   Genitourinary: Negative.   Musculoskeletal:  Positive for back pain.  Skin: Negative.   Allergic/Immunologic: Negative.   Neurological: Negative.   Hematological: Negative.   Psychiatric/Behavioral: Negative.        Objective:   Physical Exam Vitals and nursing note reviewed.  Constitutional:      Appearance: Normal appearance.  Neck:     Comments: Cervical Paraspinal Tenderness: C-5- C-6 Cardiovascular:     Rate and Rhythm: Normal rate and regular rhythm.     Pulses: Normal pulses.     Heart sounds: Normal heart sounds.  Pulmonary:     Effort: Pulmonary effort is normal.     Breath sounds: Normal breath sounds.  Musculoskeletal:     Cervical back: Normal range of motion and neck supple.     Comments: Normal Muscle Bulk and Muscle Testing Reveals:  Upper Extremities: Full ROM and Muscle Strength 5/5 Bilateral AC Joint Tenderness Lumbar Paraspinal Tenderness: L-3-L-5 Lower Extremities: Full ROM and Muscle Strength 5/5 Arises from Table with ease Narrow Based  Gait     Skin:    General: Skin  is warm and dry.  Neurological:     Mental Status: He  is alert and oriented to person, place, and time.  Psychiatric:        Mood and Affect: Mood normal.        Behavior: Behavior normal.         Assessment & Plan:  1.Cervical Postlaminectomy: Continue HEP and Continue current medication regime. 11/06/2021. 2. Cervicalgia/ Cervical Radiculitis: No complaints today. Continue current medication regime. Continue to Monitor. 11/06/2021 3. Chronic Bilateral Shoulder Pain: No complaints today. Continue HEP as Tolerated. Continue to monitor.11/06/2021 4. L2 compression fracture/ Chronic Bilateral Low Back Pain without Sciatica: Continue to Monitor:11/06/2021  Refilled: Oxycodone 7.5  mg  one tablet every 6 hours as needed for moderate pain. # 120. Marland Kitchen  We will continue the opioid monitoring program, this consists of regular clinic visits, examinations, urine drug screen, pill counts as well as use of [place] Controlled Substance Reporting system. A 12 month History has been reviewed on the [place] on 11/06/2021.  5. Polyarthralgia: No complaints today. Continue to Monitor. 11/06/2021 6. Chronic Bilateral Thoracic Pain: No complaints today. Continue HEP as Tolerated. Continue  Current medication regimen. Continue to Monitor. 11/06/2021   F/U in 1 month

## 2021-11-08 ENCOUNTER — Encounter: Payer: Medicare Other | Admitting: Registered Nurse

## 2021-11-09 LAB — TOXASSURE SELECT,+ANTIDEPR,UR

## 2021-11-12 ENCOUNTER — Telehealth: Payer: Self-pay | Admitting: *Deleted

## 2021-11-12 NOTE — Telephone Encounter (Signed)
Urine drug screen for this encounter is consistent for prescribed medication 

## 2021-12-10 ENCOUNTER — Encounter: Payer: Medicare Other | Admitting: Registered Nurse

## 2021-12-11 ENCOUNTER — Encounter: Payer: Medicare Other | Attending: Physical Medicine & Rehabilitation | Admitting: Registered Nurse

## 2021-12-11 VITALS — BP 121/78 | HR 65 | Ht 69.0 in | Wt 175.4 lb

## 2021-12-11 DIAGNOSIS — M545 Low back pain, unspecified: Secondary | ICD-10-CM | POA: Insufficient documentation

## 2021-12-11 DIAGNOSIS — Z5181 Encounter for therapeutic drug level monitoring: Secondary | ICD-10-CM | POA: Insufficient documentation

## 2021-12-11 DIAGNOSIS — G8929 Other chronic pain: Secondary | ICD-10-CM | POA: Diagnosis not present

## 2021-12-11 DIAGNOSIS — Z79891 Long term (current) use of opiate analgesic: Secondary | ICD-10-CM | POA: Diagnosis not present

## 2021-12-11 DIAGNOSIS — M542 Cervicalgia: Secondary | ICD-10-CM | POA: Diagnosis not present

## 2021-12-11 DIAGNOSIS — G894 Chronic pain syndrome: Secondary | ICD-10-CM | POA: Insufficient documentation

## 2021-12-11 MED ORDER — OXYCODONE HCL 5 MG PO TABS: 5.0000 mg | ORAL_TABLET | Freq: Three times a day (TID) | ORAL | 0 refills | Status: DC | PRN

## 2021-12-11 NOTE — Progress Notes (Signed)
Subjective:    Patient ID: Fred [name], male    DOB: 07/09/1950, 71 y.o.   MRN: [id number]  HPI: Fred [name] is a 71 y.o. male who returns for follow up appointment for chronic pain and medication refill. He states his pain is located in his neck and lower back pain. He rates his pain 1. His current exercise regime is walking and performing stretching exercises.  [place] Morphine equivalent is 18.00 MME.   Last UDS was Performed on 11/06/2021, it was consistent.   Pain Inventory Average Pain 4 Pain Right Now 1 My pain is sharp and tingling  In the last 24 hours, has pain interfered with the following? General activity 3 Relation with others 2 Enjoyment of life 2 What TIME of day is your pain at its worst? daytime and evening Sleep (in general) Fair  Pain is worse with: walking, bending, standing, and some activites Pain improves with: rest and medication Relief from Meds: 5  Family History  Problem Relation Age of Onset   Stroke Mother    Dementia Mother    Heart disease Father    Stroke Father        heat stroke   Kidney disease Brother    Dementia Brother    Social History   Socioeconomic History   Marital status: Married    Spouse name: Not on file   Number of children: 6   Years of education: 12+   Highest education level: Not on file  Occupational History   Occupation: Retired  Tobacco Use   Smoking status: Former    Packs/day: 1.00    Years: 30.00    Total pack years: 30.00    Types: Cigarettes    Quit date: 05/25/2001    Years since quitting: 20.5   Smokeless tobacco: Former    Types: Chew, Snuff    Quit date: 09/22/2001  Vaping Use   Vaping Use: Never used  Substance and Sexual Activity   Alcohol use: Not Currently   Drug use: No   Sexual activity: Not on file  Other Topics Concern   Not on file  Social History Narrative   Lives with wife   Caffeine use: Coffee daily (1-2 per day)   Some soda   Right handed    Social Determinants  of Health   Financial Resource Strain: Not on file  Food Insecurity: Not on file  Transportation Needs: Not on file  Physical Activity: Not on file  Stress: Not on file  Social Connections: Not on file   Past Surgical History:  Procedure Laterality Date   ANTERIOR CERVICAL DECOMP/DISCECTOMY FUSION  09-19-2010    dr Ronnald Ramp  '@[facility]'$    COLONOSCOPY W/ POLYPECTOMY  last one 03/ 2019   CYSTOSCOPY N/A 05/17/2018   Procedure: CYSTOSCOPY/ REMOVAL OF BLADDER CALCULUS;  Surgeon: [name], MD;  [facility];  Service: Urology;  Laterality: N/A;  ONLY NEEDS 45 MIN   FINGER SURGERY  1970s   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 RIGHT MIDDLE FINGER   IMPLANTATION BONE ANCHORED HEARING AID Right 2011    '@[facility]'$    "no longer have this"   LYMPHADENECTOMY Bilateral 08/04/2013   Procedure: LYMPHADENECTOMY;  Surgeon: Dutch Gray, MD;  [facility];  Service: Urology;  Laterality: Bilateral;   PROSTATE SURGERY     ROBOT ASSISTED LAPAROSCOPIC RADICAL PROSTATECTOMY N/A 08/04/2013   Procedure: ROBOTIC ASSISTED LAPAROSCOPIC RADICAL PROSTATECTOMY LEVEL 2;  Surgeon: Dutch Gray, MD;  [facility];  Service: Urology;  Laterality: N/A;   Past Surgical History:  Procedure Laterality Date   ANTERIOR CERVICAL DECOMP/DISCECTOMY FUSION  09-19-2010    dr Ronnald Ramp  '@[facility]'$    COLONOSCOPY W/ POLYPECTOMY  last one 03/ 2019   CYSTOSCOPY N/A 05/17/2018   Procedure: CYSTOSCOPY/ REMOVAL OF BLADDER CALCULUS;  Surgeon: [name], MD;  [facility];  Service: Urology;  Laterality: N/A;  ONLY NEEDS 45 MIN   FINGER SURGERY  1970s   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 RIGHT MIDDLE FINGER   IMPLANTATION BONE ANCHORED HEARING AID Right 2011    '@[facility]'$    "no longer have this"   LYMPHADENECTOMY Bilateral 08/04/2013   Procedure: LYMPHADENECTOMY;  Surgeon: Dutch Gray, MD;  [facility];  Service: Urology;  Laterality: Bilateral;   PROSTATE SURGERY     ROBOT ASSISTED  LAPAROSCOPIC RADICAL PROSTATECTOMY N/A 08/04/2013   Procedure: ROBOTIC ASSISTED LAPAROSCOPIC RADICAL PROSTATECTOMY LEVEL 2;  Surgeon: Dutch Gray, MD;  [facility];  Service: Urology;  Laterality: N/A;   Past Medical History:  Diagnosis Date   Anticoagulated    xarelto   Benign essential tremor 10/12/2019   Depression    Gait abnormality    MILD --- DOES NOT USE CANE   Hearing loss    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   History of kidney stones    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 ,OCCIPITAL SKULL FRACTURE-- RESIDUAL MILD MEMORY ISSUES AND RIGHT SIDE HEARING LOSS (PT FELL ON HEAD FROM 6 FT LADDER)   Hyperlipidemia    Hypertension    Mild memory disturbance    DUE TO TBI , 10-2008   (per pt mild)   Nocturia    twice   Prostate cancer ([place])    UROLOGIST-- DR [name]---  Stage T2a, Gleason 4+3, PSA 5.4,  s/p  radical prostatectomy 2015;   05-14-2018  per pt PSA undetectable   Pulmonary embolism, bilateral ([place]) 12/02/2017   currently taking xarelto   Wears dentures    upper   Wears glasses    Wears hearing aid in both ears    BP 121/78   Pulse 65   Ht '5\' 9"'$  (1.753 m)   Wt 175 lb 6.4 oz (79.6 kg)   SpO2 93%   BMI 25.90 kg/m   Opioid Risk Score:   Fall Risk Score:  `1  Depression screen [facility] 2/9     12/11/2021   12:58 PM 11/06/2021    3:03 PM 10/07/2021    9:25 AM 08/30/2021    3:01 PM 12/07/2020    9:28 AM 10/09/2020    9:38 AM 07/04/2020   10:49 AM  Depression screen [facility]Q 2/9  Decreased Interest 0 0 0 0 0 0 0  Down, Depressed, Hopeless 0 0 0 0 0 0 0  PHQ - 2 Score 0 0 0 0 0 0 0      Review of Systems  Musculoskeletal:  Positive for back pain.  All other systems reviewed and are negative.     Objective:   Physical Exam Vitals and nursing note reviewed.  Constitutional:      Appearance: Normal appearance.  Neck:     Comments: Cervical Paraspinal Tenderness: C-5-C-6 Cardiovascular:     Rate and Rhythm: Normal rate  and regular rhythm.     Pulses: Normal pulses.     Heart sounds: Normal heart sounds.  Pulmonary:     Effort: Pulmonary effort is normal.  Breath sounds: Normal breath sounds.  Musculoskeletal:     Cervical back: Normal range of motion and neck supple.     Comments: Normal Muscle Bulk and Muscle Testing Reveals:  Upper Extremities: Full ROM and Muscle Strength 5/5 Bilateral AC Joint Tenderness  Lumbar Paraspinal Tenderness: L-3-L-5 Lower Extremities: Full ROM and Muscle Strength 5/5 Arises from Table with ease Narrow Based  Gait     Skin:    General: Skin is warm and dry.  Neurological:     Mental Status: He is alert and oriented to person, place, and time.  Psychiatric:        Mood and Affect: Mood normal.        Behavior: Behavior normal.         Assessment & Plan:  1.Cervical Postlaminectomy: Continue HEP and Continue current medication regime. 12/11/2021. 2. Cervicalgia/ Cervical Radiculitis: No complaints today. Continue current medication regime. Continue to Monitor. 12/11/2021 3. Chronic Bilateral Shoulder Pain: No complaints today. Continue HEP as Tolerated. Continue to monitor.12/11/2021 4. L2 compression fracture/ Chronic Bilateral Low Back Pain without Sciatica: Continue to Monitor:12/11/2021  Refilled: Oxycodone 7.5  mg  one tablet every 6 hours as needed for moderate pain. # 120. Marland Kitchen  We will continue the opioid monitoring program, this consists of regular clinic visits, examinations, urine drug screen, pill counts as well as use of [place] Controlled Substance Reporting system. A 12 month History has been reviewed on the [place] on 12/11/2021.  5. Polyarthralgia: No complaints today. Continue to Monitor. 12/11/2021 6. Chronic Bilateral Thoracic Pain: No complaints today. Continue HEP as Tolerated. Continue  Current medication regimen. Continue to Monitor. 12/11/2021   F/U in 1 month

## 2021-12-20 ENCOUNTER — Encounter: Payer: Self-pay | Admitting: Registered Nurse

## 2022-01-09 ENCOUNTER — Ambulatory Visit: Payer: Medicare Other | Admitting: Registered Nurse

## 2022-01-13 ENCOUNTER — Encounter: Payer: Self-pay | Admitting: Registered Nurse

## 2022-01-13 ENCOUNTER — Encounter: Payer: Medicare Other | Attending: Physical Medicine & Rehabilitation | Admitting: Registered Nurse

## 2022-01-13 VITALS — BP 105/70 | HR 56 | Ht 69.0 in | Wt 174.4 lb

## 2022-01-13 DIAGNOSIS — Z79891 Long term (current) use of opiate analgesic: Secondary | ICD-10-CM | POA: Diagnosis not present

## 2022-01-13 DIAGNOSIS — G894 Chronic pain syndrome: Secondary | ICD-10-CM

## 2022-01-13 DIAGNOSIS — M5412 Radiculopathy, cervical region: Secondary | ICD-10-CM | POA: Diagnosis not present

## 2022-01-13 DIAGNOSIS — R001 Bradycardia, unspecified: Secondary | ICD-10-CM | POA: Diagnosis not present

## 2022-01-13 DIAGNOSIS — M542 Cervicalgia: Secondary | ICD-10-CM

## 2022-01-13 DIAGNOSIS — M545 Low back pain, unspecified: Secondary | ICD-10-CM

## 2022-01-13 DIAGNOSIS — Z5181 Encounter for therapeutic drug level monitoring: Secondary | ICD-10-CM

## 2022-01-13 DIAGNOSIS — G8929 Other chronic pain: Secondary | ICD-10-CM | POA: Diagnosis not present

## 2022-01-13 DIAGNOSIS — M4802 Spinal stenosis, cervical region: Secondary | ICD-10-CM

## 2022-01-13 MED ORDER — OXYCODONE HCL 5 MG PO TABS: 5.0000 mg | ORAL_TABLET | Freq: Three times a day (TID) | ORAL | 0 refills | Status: DC | PRN

## 2022-01-13 NOTE — Progress Notes (Signed)
Subjective:    Patient ID: Fred [name], male    DOB: [birth year], 71 y.o.   MRN: [id number]  HPI: Fred [name] is a 71 y.o. male who returns for follow up appointment for chronic pain and medication refill. She states her pain is located in her neck radiating into his bilateral shoulders and lower back pain. He rates his pain 4. His current exercise regime is walking and performing stretching exercises.  Mr. Folmar Morphine equivalent is 19.50  MME.   Last UDS was Performed on 11/06/2021, it was consistent.    Pain Inventory Average Pain 6 Pain Right Now 4 My pain is stabbing, tingling, and aching  In the last 24 hours, has pain interfered with the following? General activity 4 Relation with others 4 Enjoyment of life 5 What TIME of day is your pain at its worst? daytime and evening Sleep (in general) Fair  Pain is worse with: walking, bending, and standing Pain improves with: medication Relief from Meds: 5  Family History  Problem Relation Age of Onset   Stroke Mother    Dementia Mother    Heart disease Father    Stroke Father        heat stroke   Kidney disease Brother    Dementia Brother    Social History   Socioeconomic History   Marital status: Married    Spouse name: Not on file   Number of children: 6   Years of education: 12+   Highest education level: Not on file  Occupational History   Occupation: Retired  Tobacco Use   Smoking status: Former    Packs/day: 1.00    Years: 30.00    Total pack years: 30.00    Types: Cigarettes    Quit date: 05/25/2001    Years since quitting: 20.6   Smokeless tobacco: Former    Types: Chew, Snuff    Quit date: 09/22/2001  Vaping Use   Vaping Use: Never used  Substance and Sexual Activity   Alcohol use: Not Currently   Drug use: No   Sexual activity: Not on file  Other Topics Concern   Not on file  Social History Narrative   Lives with wife   Caffeine use: Coffee daily (1-2 per day)   Some soda   Right  handed    Social Determinants of Health   Financial Resource Strain: Not on file  Food Insecurity: Not on file  Transportation Needs: Not on file  Physical Activity: Not on file  Stress: Not on file  Social Connections: Not on file   Past Surgical History:  Procedure Laterality Date   ANTERIOR CERVICAL DECOMP/DISCECTOMY FUSION  09-19-2010    dr Ronnald Ramp  '@[facility]'$    COLONOSCOPY W/ POLYPECTOMY  last one 03/ 2019   CYSTOSCOPY N/A 05/17/2018   Procedure: CYSTOSCOPY/ REMOVAL OF BLADDER CALCULUS;  Surgeon: [name], MD;  [facility];  Service: Urology;  Laterality: N/A;  ONLY NEEDS 45 MIN   FINGER SURGERY  1970s   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 RIGHT MIDDLE FINGER   IMPLANTATION BONE ANCHORED HEARING AID Right 2011    '@[facility]'$    "no longer have this"   LYMPHADENECTOMY Bilateral 08/04/2013   Procedure: LYMPHADENECTOMY;  Surgeon: Dutch Gray, MD;  [facility];  Service: Urology;  Laterality: Bilateral;   PROSTATE SURGERY     ROBOT ASSISTED LAPAROSCOPIC RADICAL PROSTATECTOMY N/A 08/04/2013   Procedure: ROBOTIC ASSISTED LAPAROSCOPIC RADICAL PROSTATECTOMY LEVEL 2;  Surgeon: Dutch Gray,  MD;  [facility];  Service: Urology;  Laterality: N/A;   Past Surgical History:  Procedure Laterality Date   ANTERIOR CERVICAL DECOMP/DISCECTOMY FUSION  09-19-2010    dr Ronnald Ramp  '@[facility]'$    COLONOSCOPY W/ POLYPECTOMY  last one 03/ 2019   CYSTOSCOPY N/A 05/17/2018   Procedure: CYSTOSCOPY/ REMOVAL OF BLADDER CALCULUS;  Surgeon: [name], MD;  [facility];  Service: Urology;  Laterality: N/A;  ONLY NEEDS 45 MIN   FINGER SURGERY  1970s   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 RIGHT MIDDLE FINGER   IMPLANTATION BONE ANCHORED HEARING AID Right 2011    '@[facility]'$    "no longer have this"   LYMPHADENECTOMY Bilateral 08/04/2013   Procedure: LYMPHADENECTOMY;  Surgeon: Dutch Gray, MD;  [facility];  Service: Urology;  Laterality: Bilateral;   PROSTATE  SURGERY     ROBOT ASSISTED LAPAROSCOPIC RADICAL PROSTATECTOMY N/A 08/04/2013   Procedure: ROBOTIC ASSISTED LAPAROSCOPIC RADICAL PROSTATECTOMY LEVEL 2;  Surgeon: Dutch Gray, MD;  [facility];  Service: Urology;  Laterality: N/A;   Past Medical History:  Diagnosis Date   Anticoagulated    xarelto   Benign essential tremor 10/12/2019   Depression    Gait abnormality    MILD --- DOES NOT USE CANE   Hearing loss    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   History of kidney stones    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 ,OCCIPITAL SKULL FRACTURE-- RESIDUAL MILD MEMORY ISSUES AND RIGHT SIDE HEARING LOSS (PT FELL ON HEAD FROM 6 FT LADDER)   Hyperlipidemia    Hypertension    Mild memory disturbance    DUE TO TBI , 10-2008   (per pt mild)   Nocturia    twice   Prostate cancer ([place])    UROLOGIST-- DR [name]---  Stage T2a, Gleason 4+3, PSA 5.4,  s/p  radical prostatectomy 2015;   05-14-2018  per pt PSA undetectable   Pulmonary embolism, bilateral ([place]) 12/02/2017   currently taking xarelto   Wears dentures    upper   Wears glasses    Wears hearing aid in both ears    BP 105/70   Pulse (!) 57   Ht '5\' 9"'$  (1.753 m)   Wt 174 lb 6.4 oz (79.1 kg)   SpO2 97%   BMI 25.75 kg/m   Opioid Risk Score:   Fall Risk Score:  `1  Depression screen [facility] 2/9     01/13/2022   12:50 PM 12/11/2021   12:58 PM 11/06/2021    3:03 PM 10/07/2021    9:25 AM 08/30/2021    3:01 PM 12/07/2020    9:28 AM 10/09/2020    9:38 AM  Depression screen PHQ 2/9  Decreased Interest 0 0 0 0 0 0 0  Down, Depressed, Hopeless 0 0 0 0 0 0 0  PHQ - 2 Score 0 0 0 0 0 0 0      Review of Systems  Musculoskeletal:  Positive for back pain and neck pain.       Bilateral leg pain  All other systems reviewed and are negative.     Objective:   Physical Exam Vitals and nursing note reviewed.  Constitutional:      Appearance: Normal appearance.  Cardiovascular:     Rate and Rhythm:  Regular rhythm. Bradycardia present.     Pulses: Normal pulses.     Heart sounds: Normal heart sounds.  Pulmonary:  Effort: Pulmonary effort is normal.     Breath sounds: Normal breath sounds.  Musculoskeletal:     Cervical back: Normal range of motion and neck supple.     Comments: Normal Muscle Bulk and Muscle Testing Reveals: Upper Extremities: Full ROM and Muscle Strength 5/5 Lower Extremities: Full ROM and Muscle Strength 5/5 Arises from Table with Ease Narrow Based  Gait     Skin:    General: Skin is warm and dry.  Neurological:     Mental Status: He is alert and oriented to person, place, and time.  Psychiatric:        Mood and Affect: Mood normal.        Behavior: Behavior normal.         Assessment & Plan:  1.Cervical Postlaminectomy: Continue HEP and Continue current medication regime. 01/13/2022. 2. Cervicalgia/ Cervical Radiculitis: No complaints today. Continue current medication regime. Continue to Monitor. 01/13/2022 3. Chronic Bilateral Shoulder Pain: No complaints today. Continue HEP as Tolerated. Continue to monitor.01/13/2022 4. L2 compression fracture/ Chronic Bilateral Low Back Pain without Sciatica: Continue to Monitor:01/13/2022  Refilled: Oxycodone 5  mg  one tablet every 8 hours as needed for moderate pain. # 120. Marland Kitchen  We will continue the opioid monitoring program, this consists of regular clinic visits, examinations, urine drug screen, pill counts as well as use of [place] Controlled Substance Reporting system. A 12 month History has been reviewed on the [place] on 01/13/2022.  5. Polyarthralgia: No complaints today. Continue to Monitor. 01/13/2022 6. Chronic Bilateral Thoracic Pain: No complaints today. Continue HEP as Tolerated. Continue  Current medication regimen. Continue to Monitor. 01/13/2022 7. Bradycardia: Apical Pulse Checked: He will F/U with his PCP and Keep a blood pressure and pulse log.  Mrs. Howeerton verbalizes understanding and will call his PCP Dr [name].   F/U in 1 month

## 2022-02-12 ENCOUNTER — Encounter: Payer: Medicare Other | Attending: Physical Medicine & Rehabilitation | Admitting: Registered Nurse

## 2022-02-12 VITALS — BP 106/71 | HR 61 | Ht 69.0 in | Wt 176.2 lb

## 2022-02-12 DIAGNOSIS — M545 Low back pain, unspecified: Secondary | ICD-10-CM | POA: Diagnosis not present

## 2022-02-12 DIAGNOSIS — M5412 Radiculopathy, cervical region: Secondary | ICD-10-CM | POA: Diagnosis not present

## 2022-02-12 DIAGNOSIS — G894 Chronic pain syndrome: Secondary | ICD-10-CM

## 2022-02-12 DIAGNOSIS — Z79891 Long term (current) use of opiate analgesic: Secondary | ICD-10-CM | POA: Insufficient documentation

## 2022-02-12 DIAGNOSIS — M542 Cervicalgia: Secondary | ICD-10-CM | POA: Diagnosis not present

## 2022-02-12 DIAGNOSIS — Z5181 Encounter for therapeutic drug level monitoring: Secondary | ICD-10-CM | POA: Diagnosis not present

## 2022-02-12 DIAGNOSIS — G8929 Other chronic pain: Secondary | ICD-10-CM

## 2022-02-12 MED ORDER — OXYCODONE HCL 5 MG PO TABS: 5.0000 mg | ORAL_TABLET | Freq: Three times a day (TID) | ORAL | 0 refills | Status: DC | PRN

## 2022-02-12 NOTE — Progress Notes (Signed)
Subjective:    Patient ID: Fred [name], male    DOB: 1950-08-17, 71 y.o.   MRN: [id number]  HPI: Fred [name] is a 71 y.o. male who returns for follow up appointment for chronic pain and medication refill. He states his pain is located in his neck radiating into his bilateral shoulder and lower back pain. He rates his pain 6. His current exercise regime is walking and performing stretching exercises.  Mr. Huster Morphine equivalent is 21.75 MME.   Last UDS was Performed on 11/06/2021, it was consistent.     Pain Inventory Average Pain 5 Pain Right Now 6 My pain is sharp, stabbing, and tingling  In the last 24 hours, has pain interfered with the following? General activity 3 Relation with others 3 Enjoyment of life 4 What TIME of day is your pain at its worst? daytime and evening Sleep (in general) Fair  Pain is worse with: walking and some activites Pain improves with: rest and medication Relief from Meds: 3  Family History  Problem Relation Age of Onset   Stroke Mother    Dementia Mother    Heart disease Father    Stroke Father        heat stroke   Kidney disease Brother    Dementia Brother    Social History   Socioeconomic History   Marital status: Married    Spouse name: Not on file   Number of children: 6   Years of education: 12+   Highest education level: Not on file  Occupational History   Occupation: Retired  Tobacco Use   Smoking status: Former    Packs/day: 1.00    Years: 30.00    Total pack years: 30.00    Types: Cigarettes    Quit date: 05/25/2001    Years since quitting: 20.7   Smokeless tobacco: Former    Types: Chew, Snuff    Quit date: 09/22/2001  Vaping Use   Vaping Use: Never used  Substance and Sexual Activity   Alcohol use: Not Currently   Drug use: No   Sexual activity: Not on file  Other Topics Concern   Not on file  Social History Narrative   Lives with wife   Caffeine use: Coffee daily (1-2 per day)   Some soda    Right handed    Social Determinants of Health   Financial Resource Strain: Not on file  Food Insecurity: Not on file  Transportation Needs: Not on file  Physical Activity: Not on file  Stress: Not on file  Social Connections: Not on file   Past Surgical History:  Procedure Laterality Date   ANTERIOR CERVICAL DECOMP/DISCECTOMY FUSION  09-19-2010    dr Ronnald Ramp  '@[facility]'$    COLONOSCOPY W/ POLYPECTOMY  last one 03/ 2019   CYSTOSCOPY N/A 05/17/2018   Procedure: CYSTOSCOPY/ REMOVAL OF BLADDER CALCULUS;  Surgeon: [name], MD;  [facility];  Service: Urology;  Laterality: N/A;  ONLY NEEDS 45 MIN   FINGER SURGERY  1970s   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 RIGHT MIDDLE FINGER   IMPLANTATION BONE ANCHORED HEARING AID Right 2011    '@[facility]'$    "no longer have this"   LYMPHADENECTOMY Bilateral 08/04/2013   Procedure: LYMPHADENECTOMY;  Surgeon: Dutch Gray, MD;  [facility];  Service: Urology;  Laterality: Bilateral;   PROSTATE SURGERY     ROBOT ASSISTED LAPAROSCOPIC RADICAL PROSTATECTOMY N/A 08/04/2013   Procedure: ROBOTIC ASSISTED LAPAROSCOPIC RADICAL PROSTATECTOMY LEVEL 2;  Surgeon:  Dutch Gray, MD;  [facility];  Service: Urology;  Laterality: N/A;   Past Surgical History:  Procedure Laterality Date   ANTERIOR CERVICAL DECOMP/DISCECTOMY FUSION  09-19-2010    dr Ronnald Ramp  '@[facility]'$    COLONOSCOPY W/ POLYPECTOMY  last one 03/ 2019   CYSTOSCOPY N/A 05/17/2018   Procedure: CYSTOSCOPY/ REMOVAL OF BLADDER CALCULUS;  Surgeon: [name], MD;  [facility];  Service: Urology;  Laterality: N/A;  ONLY NEEDS 45 MIN   FINGER SURGERY  1970s   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 RIGHT MIDDLE FINGER   IMPLANTATION BONE ANCHORED HEARING AID Right 2011    '@[facility]'$    "no longer have this"   LYMPHADENECTOMY Bilateral 08/04/2013   Procedure: LYMPHADENECTOMY;  Surgeon: Dutch Gray, MD;  [facility];  Service: Urology;  Laterality: Bilateral;    PROSTATE SURGERY     ROBOT ASSISTED LAPAROSCOPIC RADICAL PROSTATECTOMY N/A 08/04/2013   Procedure: ROBOTIC ASSISTED LAPAROSCOPIC RADICAL PROSTATECTOMY LEVEL 2;  Surgeon: Dutch Gray, MD;  [facility];  Service: Urology;  Laterality: N/A;   Past Medical History:  Diagnosis Date   Anticoagulated    xarelto   Benign essential tremor 10/12/2019   Depression    Gait abnormality    MILD --- DOES NOT USE CANE   Hearing loss    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   History of kidney stones    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 ,OCCIPITAL SKULL FRACTURE-- RESIDUAL MILD MEMORY ISSUES AND RIGHT SIDE HEARING LOSS (PT FELL ON HEAD FROM 6 FT LADDER)   Hyperlipidemia    Hypertension    Mild memory disturbance    DUE TO TBI , 10-2008   (per pt mild)   Nocturia    twice   Prostate cancer ([place])    UROLOGIST-- DR [name]---  Stage T2a, Gleason 4+3, PSA 5.4,  s/p  radical prostatectomy 2015;   05-14-2018  per pt PSA undetectable   Pulmonary embolism, bilateral ([place]) 12/02/2017   currently taking xarelto   Wears dentures    upper   Wears glasses    Wears hearing aid in both ears    BP 106/71   Pulse 61   Ht '5\' 9"'$  (1.753 m)   Wt 176 lb 3.2 oz (79.9 kg)   SpO2 93%   BMI 26.02 kg/m   Opioid Risk Score:   Fall Risk Score:  `1  Depression screen [facility] 2/9     02/12/2022   10:27 AM 01/13/2022   12:50 PM 12/11/2021   12:58 PM 11/06/2021    3:03 PM 10/07/2021    9:25 AM 08/30/2021    3:01 PM 12/07/2020    9:28 AM  Depression screen PHQ 2/9  Decreased Interest 0 0 0 0 0 0 0  Down, Depressed, Hopeless 0 0 0 0 0 0 0  PHQ - 2 Score 0 0 0 0 0 0 0     Review of Systems  Musculoskeletal:  Positive for back pain.       Bilateral hand pain Bilateral arm pain Bilateral shoulder pain   All other systems reviewed and are negative.     Objective:   Physical Exam Vitals and nursing note reviewed.  Constitutional:      Appearance: Normal appearance.   Neck:     Comments: Cervical Paraspinal Tenderness: C-5- C-6  Cardiovascular:     Rate and Rhythm: Normal rate and regular rhythm.     Pulses:  Normal pulses.     Heart sounds: Normal heart sounds.  Pulmonary:     Effort: Pulmonary effort is normal.     Breath sounds: Normal breath sounds.  Musculoskeletal:     Cervical back: Normal range of motion and neck supple.     Comments: Normal Muscle Bulk and Muscle Testing Reveals:  Upper Extremities: Full ROM and Muscle Strength 5/5 Bilateral AC Joint Tenderness  Lumbar Paraspinal Tenderness: L-4-L-5 Lower Extremities: Full ROM and Muscle Strength 5/5 Arises from Table with ease Narrow Based  Gait     Skin:    General: Skin is warm and dry.  Neurological:     Mental Status: He is alert and oriented to person, place, and time.  Psychiatric:        Mood and Affect: Mood normal.        Behavior: Behavior normal.           Assessment & Plan:  1.Cervical Postlaminectomy: Continue HEP and Continue current medication regime. 02/14/2022. 2. Cervicalgia/ Cervical Radiculitis: No complaints today. Continue current medication regime. Continue to Monitor. 02/14/2022 3. Chronic Bilateral Shoulder Pain: No complaints today. Continue HEP as Tolerated. Continue to monitor.02/14/2022 4. L2 compression fracture/ Chronic Bilateral Low Back Pain without Sciatica: Continue to Monitor:02/14/2022  Refilled: Oxycodone 5  mg  one tablet every 8 hours as needed for moderate pain. # 120. Marland Kitchen  We will continue the opioid monitoring program, this consists of regular clinic visits, examinations, urine drug screen, pill counts as well as use of [place] Controlled Substance Reporting system. A 12 month History has been reviewed on the [place] on 02/14/2022.  5. Polyarthralgia: No complaints today. Continue to Monitor. 02/14/2022 6. Chronic Bilateral Thoracic Pain: No complaints today. Continue HEP as Tolerated.  Continue  Current medication regimen. Continue to Monitor. 02/14/2022  F/U in 1 month

## 2022-02-14 ENCOUNTER — Encounter: Payer: Self-pay | Admitting: Registered Nurse

## 2022-03-19 DIAGNOSIS — Z1283 Encounter for screening for malignant neoplasm of skin: Secondary | ICD-10-CM | POA: Diagnosis not present

## 2022-03-19 DIAGNOSIS — R251 Tremor, unspecified: Secondary | ICD-10-CM | POA: Diagnosis not present

## 2022-03-19 DIAGNOSIS — E782 Mixed hyperlipidemia: Secondary | ICD-10-CM | POA: Diagnosis not present

## 2022-03-19 DIAGNOSIS — I129 Hypertensive chronic kidney disease with stage 1 through stage 4 chronic kidney disease, or unspecified chronic kidney disease: Secondary | ICD-10-CM | POA: Diagnosis not present

## 2022-03-19 DIAGNOSIS — N1831 Chronic kidney disease, stage 3a: Secondary | ICD-10-CM | POA: Diagnosis not present

## 2022-03-19 DIAGNOSIS — G9389 Other specified disorders of brain: Secondary | ICD-10-CM | POA: Diagnosis not present

## 2022-03-19 DIAGNOSIS — G300 Alzheimer's disease with early onset: Secondary | ICD-10-CM | POA: Diagnosis not present

## 2022-03-19 DIAGNOSIS — Z23 Encounter for immunization: Secondary | ICD-10-CM | POA: Diagnosis not present

## 2022-04-08 ENCOUNTER — Encounter: Payer: Medicare Other | Attending: Physical Medicine & Rehabilitation | Admitting: Registered Nurse

## 2022-04-08 ENCOUNTER — Encounter: Payer: Self-pay | Admitting: Registered Nurse

## 2022-04-08 VITALS — BP 114/76 | HR 65 | Ht 69.0 in | Wt 182.0 lb

## 2022-04-08 DIAGNOSIS — M542 Cervicalgia: Secondary | ICD-10-CM

## 2022-04-08 DIAGNOSIS — Z79891 Long term (current) use of opiate analgesic: Secondary | ICD-10-CM | POA: Diagnosis not present

## 2022-04-08 DIAGNOSIS — M545 Low back pain, unspecified: Secondary | ICD-10-CM | POA: Diagnosis not present

## 2022-04-08 DIAGNOSIS — M5412 Radiculopathy, cervical region: Secondary | ICD-10-CM | POA: Diagnosis not present

## 2022-04-08 DIAGNOSIS — G894 Chronic pain syndrome: Secondary | ICD-10-CM

## 2022-04-08 DIAGNOSIS — G8929 Other chronic pain: Secondary | ICD-10-CM | POA: Diagnosis not present

## 2022-04-08 DIAGNOSIS — Z5181 Encounter for therapeutic drug level monitoring: Secondary | ICD-10-CM

## 2022-04-08 MED ORDER — OXYCODONE HCL 5 MG PO TABS: 5.0000 mg | ORAL_TABLET | Freq: Three times a day (TID) | ORAL | 0 refills | Status: DC | PRN

## 2022-04-08 NOTE — Progress Notes (Signed)
Subjective:    Patient ID: Fred [name], male    DOB: 1950-09-23, 71 y.o.   MRN: [id number]  HPI: Fred [name] is a 71 y.o. male who returns for follow up appointment for chronic pain and medication refill. He states his pain is located in his neck radiating into her bilateral shoulders and lower back pain. He rates his pain 5. His current exercise regime is walking, light yard work  and performing stretching exercises.  Ms. Fred [name] equivalent is 22.50 MME.   UDS ordered today.     Pain Inventory Average Pain 6 Pain Right Now 5 My pain is sharp, stabbing, and aching  In the last 24 hours, has pain interfered with the following? General activity 6 Relation with others 7 Enjoyment of life 7 What TIME of day is your pain at its worst? daytime and evening Sleep (in general) Fair  Pain is worse with: some activites Pain improves with: rest and medication Relief from Meds: 6  Family History  Problem Relation Age of Onset   Stroke Mother    Dementia Mother    Heart disease Father    Stroke Father        heat stroke   Kidney disease Brother    Dementia Brother    Social History   Socioeconomic History   Marital status: Married    Spouse name: Not on file   Number of children: 6   Years of education: 12+   Highest education level: Not on file  Occupational History   Occupation: Retired  Tobacco Use   Smoking status: Former    Packs/day: 1.00    Years: 30.00    Total pack years: 30.00    Types: Cigarettes    Quit date: 05/25/2001    Years since quitting: 20.8   Smokeless tobacco: Former    Types: Chew, Snuff    Quit date: 09/22/2001  Vaping Use   Vaping Use: Never used  Substance and Sexual Activity   Alcohol use: Not Currently   Drug use: No   Sexual activity: Not on file  Other Topics Concern   Not on file  Social History Narrative   Lives with wife   Caffeine use: Coffee daily (1-2 per day)   Some soda   Right handed    Social  Determinants of Health   Financial Resource Strain: Not on file  Food Insecurity: Not on file  Transportation Needs: Not on file  Physical Activity: Not on file  Stress: Not on file  Social Connections: Not on file   Past Surgical History:  Procedure Laterality Date   ANTERIOR CERVICAL DECOMP/DISCECTOMY FUSION  09-19-2010    dr Fred [name]  '@[facility]'$    COLONOSCOPY W/ POLYPECTOMY  last one 03/ 2019   CYSTOSCOPY N/A 05/17/2018   Procedure: CYSTOSCOPY/ REMOVAL OF BLADDER CALCULUS;  Surgeon: Fred Bring, MD;  [facility];  Service: Urology;  Laterality: N/A;  ONLY NEEDS 45 MIN   FINGER SURGERY  1970s   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 RIGHT MIDDLE FINGER   IMPLANTATION BONE ANCHORED HEARING AID Right 2011    '@[facility]'$    "no longer have this"   LYMPHADENECTOMY Bilateral 08/04/2013   Procedure: LYMPHADENECTOMY;  Surgeon: Fred Gray, MD;  [facility];  Service: Urology;  Laterality: Bilateral;   PROSTATE SURGERY     ROBOT ASSISTED LAPAROSCOPIC RADICAL PROSTATECTOMY N/A 08/04/2013   Procedure: ROBOTIC ASSISTED LAPAROSCOPIC RADICAL PROSTATECTOMY LEVEL 2;  Surgeon: Fred Gray, MD;  [facility];  Service: Urology;  Laterality: N/A;   Past Surgical History:  Procedure Laterality Date   ANTERIOR CERVICAL DECOMP/DISCECTOMY FUSION  09-19-2010    dr Fred [name]  '@[facility]'$    COLONOSCOPY W/ POLYPECTOMY  last one 03/ 2019   CYSTOSCOPY N/A 05/17/2018   Procedure: CYSTOSCOPY/ REMOVAL OF BLADDER CALCULUS;  Surgeon: Fred Bring, MD;  [facility];  Service: Urology;  Laterality: N/A;  ONLY NEEDS 45 MIN   FINGER SURGERY  1970s   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 RIGHT MIDDLE FINGER   IMPLANTATION BONE ANCHORED HEARING AID Right 2011    '@[facility]'$    "no longer have this"   LYMPHADENECTOMY Bilateral 08/04/2013   Procedure: LYMPHADENECTOMY;  Surgeon: Fred Gray, MD;  [facility];  Service: Urology;  Laterality: Bilateral;   PROSTATE SURGERY     ROBOT  ASSISTED LAPAROSCOPIC RADICAL PROSTATECTOMY N/A 08/04/2013   Procedure: ROBOTIC ASSISTED LAPAROSCOPIC RADICAL PROSTATECTOMY LEVEL 2;  Surgeon: Fred Gray, MD;  [facility];  Service: Urology;  Laterality: N/A;   Past Medical History:  Diagnosis Date   Anticoagulated    xarelto   Benign essential tremor 10/12/2019   Depression    Gait abnormality    MILD --- DOES NOT USE CANE   Hearing loss    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   History of kidney stones    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 ,OCCIPITAL SKULL FRACTURE-- RESIDUAL MILD MEMORY ISSUES AND RIGHT SIDE HEARING LOSS (PT FELL ON HEAD FROM 6 FT LADDER)   Hyperlipidemia    Hypertension    Mild memory disturbance    DUE TO TBI , 10-2008   (per pt mild)   Nocturia    twice   Prostate cancer ([place])    Fred [name]---  Stage T2a, Gleason 4+3, PSA 5.4,  s/p  radical prostatectomy 2015;   05-14-2018  per pt PSA undetectable   Pulmonary embolism, bilateral ([place]) 12/02/2017   currently taking xarelto   Wears dentures    upper   Wears glasses    Wears hearing aid in both ears    BP 114/76   Pulse 65   Ht '5\' 9"'$  (1.753 m)   Wt 182 lb (82.6 kg)   SpO2 96%   BMI 26.88 kg/m   Opioid Risk Score:   Fall Risk Score:  `1  Depression screen Fred [name] 2/9     02/12/2022   10:27 AM 01/13/2022   12:50 PM 12/11/2021   12:58 PM 11/06/2021    3:03 PM 10/07/2021    9:25 AM 08/30/2021    3:01 PM 12/07/2020    9:28 AM  Depression screen PHQ 2/9  Decreased Interest 0 0 0 0 0 0 0  Down, Depressed, Hopeless 0 0 0 0 0 0 0  PHQ - 2 Score 0 0 0 0 0 0 0     Review of Systems  Musculoskeletal:  Positive for back pain and neck pain.       Wrist pain Foot pain  All other systems reviewed and are negative.     Objective:   Physical Exam Vitals and nursing note reviewed.  Constitutional:      Appearance: Normal appearance.  Neck:     Comments: Cervical Paraspinal Tenderness:  C-5-C-6 Cardiovascular:     Rate and Rhythm: Normal rate and regular rhythm.     Pulses: Normal pulses.     Heart sounds: Normal heart sounds.  Pulmonary:     Effort: Pulmonary effort is normal.     Breath sounds: Normal breath sounds.  Musculoskeletal:     Cervical back: Normal range of motion and neck supple.     Comments: Normal Muscle Bulk and Muscle Testing Reveals:  Upper Extremities: Full ROM and Muscle Strength 5/5 Bilateral AC Joint Tenderness  Lumbar Paraspinal Tenderness: L-3-L-5 Lower Extremities: Full ROM and Muscle Strength 5/5 Arises from Table with ease Narrow Based  Gait     Skin:    General: Skin is warm and dry.  Neurological:     Mental Status: He is alert and oriented to person, place, and time.  Psychiatric:        Mood and Affect: Mood normal.        Behavior: Behavior normal.         Assessment & Plan:  1.Cervical Postlaminectomy: Continue HEP and Continue current medication regime. 04/08/2022. 2. Cervicalgia/ Cervical Radiculitis: No complaints today. Continue current medication regime. Continue to Monitor. 04/08/2022 3. Chronic Bilateral Shoulder Pain: No complaints today. Continue HEP as Tolerated. Continue to monitor.04/08/2022 4. L2 compression fracture/ Chronic Bilateral Low Back Pain without Sciatica: Continue to Monitor:04/08/2022  Refilled: Oxycodone 5  mg  one tablet every 8 hours as needed for moderate pain. # 120. Marland Kitchen  We will continue the opioid monitoring program, this consists of regular clinic visits, examinations, urine drug screen, pill counts as well as use of [place] Controlled Substance Reporting system. A 12 month History has been reviewed on the [place] on 04/08/2022.  5. Polyarthralgia: No complaints today. Continue to Monitor. 04/08/2022 6. Chronic Bilateral Thoracic Pain: No complaints today. Continue HEP as Tolerated. Continue  Current medication regimen. Continue to Monitor.  04/08/2022   F/U in 1 month

## 2022-04-13 LAB — TOXASSURE SELECT,+ANTIDEPR,UR

## 2022-05-02 ENCOUNTER — Telehealth: Payer: Self-pay | Admitting: *Deleted

## 2022-05-02 NOTE — Telephone Encounter (Signed)
Urine drug screen for this encounter is consistent for prescribed medication 

## 2022-05-13 ENCOUNTER — Encounter: Payer: Self-pay | Admitting: Registered Nurse

## 2022-05-13 ENCOUNTER — Encounter: Payer: Medicare Other | Attending: Physical Medicine & Rehabilitation | Admitting: Registered Nurse

## 2022-05-13 VITALS — BP 135/88 | HR 70 | Ht 69.0 in | Wt 180.0 lb

## 2022-05-13 DIAGNOSIS — G8929 Other chronic pain: Secondary | ICD-10-CM | POA: Diagnosis not present

## 2022-05-13 DIAGNOSIS — M545 Low back pain, unspecified: Secondary | ICD-10-CM | POA: Diagnosis not present

## 2022-05-13 DIAGNOSIS — M542 Cervicalgia: Secondary | ICD-10-CM

## 2022-05-13 DIAGNOSIS — Z79891 Long term (current) use of opiate analgesic: Secondary | ICD-10-CM | POA: Diagnosis not present

## 2022-05-13 DIAGNOSIS — G894 Chronic pain syndrome: Secondary | ICD-10-CM | POA: Diagnosis not present

## 2022-05-13 DIAGNOSIS — M5412 Radiculopathy, cervical region: Secondary | ICD-10-CM | POA: Insufficient documentation

## 2022-05-13 DIAGNOSIS — Z5181 Encounter for therapeutic drug level monitoring: Secondary | ICD-10-CM | POA: Insufficient documentation

## 2022-05-13 MED ORDER — OXYCODONE HCL 5 MG PO TABS: 5.0000 mg | ORAL_TABLET | Freq: Three times a day (TID) | ORAL | 0 refills | Status: DC | PRN

## 2022-05-13 NOTE — Progress Notes (Signed)
Subjective:    Patient ID: Fred [name], male    DOB: [birth year], 71 y.o.   MRN: [id number]  HPI: Fred [name] is a 71 y.o. male who returns for follow up appointment for chronic pain and medication refill. He states his pain is located in his neck radiating into his bilateral shoulders and lower back pain. He rates his pain 5. His current exercise regime is walking and performing stretching exercises.  [name] Morphine equivalent is 19.50 MME.   Last UDS was Performed on 04/08/2022, it was consistent.   Mrs. Fred [name] in room.     Pain Inventory Average Pain 6 Pain Right Now 5 My pain is constant, sharp, and aching  In the last 24 hours, has pain interfered with the following? General activity 6 Relation with others 4 Enjoyment of life 5 What TIME of day is your pain at its worst? daytime and evening Sleep (in general) Good  Pain is worse with: walking, bending, standing, and some activites Pain improves with: rest and medication Relief from Meds: 7  Family History  Problem Relation Age of Onset   Stroke Mother    Dementia Mother    Heart disease Father    Stroke Father        heat stroke   Kidney disease Brother    Dementia Brother    Social History   Socioeconomic History   Marital status: Married    Spouse name: Not on file   Number of children: 6   Years of education: 12+   Highest education level: Not on file  Occupational History   Occupation: Retired  Tobacco Use   Smoking status: Former    Packs/day: 1.00    Years: 30.00    Total pack years: 30.00    Types: Cigarettes    Quit date: 05/25/2001    Years since quitting: 20.9   Smokeless tobacco: Former    Types: Chew, Snuff    Quit date: 09/22/2001  Vaping Use   Vaping Use: Never used  Substance and Sexual Activity   Alcohol use: Not Currently   Drug use: No   Sexual activity: Not on file  Other Topics Concern   Not on file  Social History Narrative   Lives with wife   Caffeine use:  Coffee daily (1-2 per day)   Some soda   Right handed    Social Determinants of Health   Financial Resource Strain: Not on file  Food Insecurity: Not on file  Transportation Needs: Not on file  Physical Activity: Not on file  Stress: Not on file  Social Connections: Not on file   Past Surgical History:  Procedure Laterality Date   ANTERIOR CERVICAL DECOMP/DISCECTOMY FUSION  09-19-2010    dr Ronnald Ramp  '@[facility]'$    COLONOSCOPY W/ POLYPECTOMY  last one 03/ 2019   CYSTOSCOPY N/A 05/17/2018   Procedure: CYSTOSCOPY/ REMOVAL OF BLADDER CALCULUS;  Surgeon: [name], MD;  [facility];  Service: Urology;  Laterality: N/A;  ONLY NEEDS 45 MIN   FINGER SURGERY  1970s   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 RIGHT MIDDLE FINGER   IMPLANTATION BONE ANCHORED HEARING AID Right 2011    '@[facility]'$    "no longer have this"   LYMPHADENECTOMY Bilateral 08/04/2013   Procedure: LYMPHADENECTOMY;  Surgeon: Dutch Gray, MD;  [facility];  Service: Urology;  Laterality: Bilateral;   PROSTATE SURGERY     ROBOT ASSISTED LAPAROSCOPIC RADICAL PROSTATECTOMY N/A 08/04/2013   Procedure: ROBOTIC  ASSISTED LAPAROSCOPIC RADICAL PROSTATECTOMY LEVEL 2;  Surgeon: Dutch Gray, MD;  [facility];  Service: Urology;  Laterality: N/A;   Past Surgical History:  Procedure Laterality Date   ANTERIOR CERVICAL DECOMP/DISCECTOMY FUSION  09-19-2010    dr Ronnald Ramp  '@[facility]'$    COLONOSCOPY W/ POLYPECTOMY  last one 03/ 2019   CYSTOSCOPY N/A 05/17/2018   Procedure: CYSTOSCOPY/ REMOVAL OF BLADDER CALCULUS;  Surgeon: [name], MD;  [facility];  Service: Urology;  Laterality: N/A;  ONLY NEEDS 45 MIN   FINGER SURGERY  1970s   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 RIGHT MIDDLE FINGER   IMPLANTATION BONE ANCHORED HEARING AID Right 2011    '@[facility]'$    "no longer have this"   LYMPHADENECTOMY Bilateral 08/04/2013   Procedure: LYMPHADENECTOMY;  Surgeon: Dutch Gray, MD;  [facility];   Service: Urology;  Laterality: Bilateral;   PROSTATE SURGERY     ROBOT ASSISTED LAPAROSCOPIC RADICAL PROSTATECTOMY N/A 08/04/2013   Procedure: ROBOTIC ASSISTED LAPAROSCOPIC RADICAL PROSTATECTOMY LEVEL 2;  Surgeon: Dutch Gray, MD;  [facility];  Service: Urology;  Laterality: N/A;   Past Medical History:  Diagnosis Date   Anticoagulated    xarelto   Benign essential tremor 10/12/2019   Depression    Gait abnormality    MILD --- DOES NOT USE CANE   Hearing loss    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   History of kidney stones    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 ,OCCIPITAL SKULL FRACTURE-- RESIDUAL MILD MEMORY ISSUES AND RIGHT SIDE HEARING LOSS (PT FELL ON HEAD FROM 6 FT LADDER)   Hyperlipidemia    Hypertension    Mild memory disturbance    DUE TO TBI , 10-2008   (per pt mild)   Nocturia    twice   Prostate cancer ([place])    UROLOGIST-- DR [name]---  Stage T2a, Gleason 4+3, PSA 5.4,  s/p  radical prostatectomy 2015;   05-14-2018  per pt PSA undetectable   Pulmonary embolism, bilateral ([place]) 12/02/2017   currently taking xarelto   Wears dentures    upper   Wears glasses    Wears hearing aid in both ears    There were no vitals taken for this visit.  Opioid Risk Score:   Fall Risk Score:  `1  Depression screen [facility] 2/9     02/12/2022   10:27 AM 01/13/2022   12:50 PM 12/11/2021   12:58 PM 11/06/2021    3:03 PM 10/07/2021    9:25 AM 08/30/2021    3:01 PM 12/07/2020    9:28 AM  Depression screen [facility] 2/9  Decreased Interest 0 0 0 0 0 0 0  Down, Depressed, Hopeless 0 0 0 0 0 0 0  PHQ - 2 Score 0 0 0 0 0 0 0     Review of Systems  Musculoskeletal:  Positive for back pain and neck pain.      Objective:   Physical Exam Vitals and nursing note reviewed.  Constitutional:      Appearance: Normal appearance.  Cardiovascular:     Rate and Rhythm: Normal rate and regular rhythm.     Pulses: Normal pulses.     Heart sounds: Normal  heart sounds.  Pulmonary:     Effort: Pulmonary effort is normal.     Breath sounds: Normal breath sounds.  Musculoskeletal:     Cervical back: Normal range of motion and neck supple.  Comments: Normal Muscle Bulk and Muscle Testing Reveals:  Upper Extremities: Full ROM and Muscle Strength 5/5 Bilateral AC Joint Tenderness  Lumbar Paraspinal Tenderness: L-4-L-5 Lower Extremities: Full ROM and Muscle Strength 5/5 Arises from Table with ease Narrow Based  Gait     Skin:    General: Skin is warm and dry.  Neurological:     Mental Status: He is alert and oriented to person, place, and time.  Psychiatric:        Mood and Affect: Mood normal.        Behavior: Behavior normal.         Assessment & Plan:  1.Cervical Postlaminectomy: Continue HEP and Continue current medication regime. 05/13/2022. 2. Cervicalgia/ Cervical Radiculitis: Continue current medication regime. Continue to Monitor. 05/13/2022 3. Chronic Bilateral Shoulder Pain:  Continue HEP as Tolerated. Continue to monitor.05/13/2022 4. L2 compression fracture/ Chronic Bilateral Low Back Pain without Sciatica: Continue to Monitor:05/13/2022  Refilled: Oxycodone 5  mg  one tablet every 8 hours as needed for moderate pain. # 90. .  We will continue the opioid monitoring program, this consists of regular clinic visits, examinations, urine drug screen, pill counts as well as use of [place] Controlled Substance Reporting system. A 12 month History has been reviewed on the [place] on 05/13/2022.  5. Polyarthralgia: No complaints today. Continue to Monitor. 05/13/2022 6. Chronic Bilateral Thoracic Pain: No complaints today. Continue HEP as Tolerated. Continue  Current medication regimen. Continue to Monitor. 05/13/2022   F/U in 1 month

## 2022-06-04 DIAGNOSIS — F02811 Dementia in other diseases classified elsewhere, unspecified severity, with agitation: Secondary | ICD-10-CM | POA: Diagnosis not present

## 2022-06-04 DIAGNOSIS — G309 Alzheimer's disease, unspecified: Secondary | ICD-10-CM | POA: Diagnosis not present

## 2022-06-12 NOTE — Progress Notes (Signed)
Subjective:    Patient ID: Fred [name], male    DOB: Jul 02, 1950, 72 y.o.   MRN: [id number]  HPI: Fred [name] is a 72 y.o. male who returns for follow up appointment for chronic pain and medication refill. He states his pain is located in his neck radiating into his bilateral shoulders and mid- lower back pain. She rates her pain 5. Her current exercise regime is walking and performing stretching exercises.  Mr. Vanhouten Morphine equivalent is 21.00 MME.   Last UDS was Performed on 04/09/2023, it was consistent.      Pain Inventory Average Pain 5 Pain Right Now 5 My pain is sharp, stabbing, and aching  In the last 24 hours, has pain interfered with the following? General activity 6 Relation with others 4 Enjoyment of life 4 What TIME of day is your pain at its worst? daytime and evening Sleep (in general) Fair  Pain is worse with: walking, bending, and standing Pain improves with: medication Relief from Meds: 5  Family History  Problem Relation Age of Onset   Stroke Mother    Dementia Mother    Heart disease Father    Stroke Father        heat stroke   Kidney disease Brother    Dementia Brother    Social History   Socioeconomic History   Marital status: Married    Spouse name: Not on file   Number of children: 6   Years of education: 12+   Highest education level: Not on file  Occupational History   Occupation: Retired  Tobacco Use   Smoking status: Former    Packs/day: 1.00    Years: 30.00    Total pack years: 30.00    Types: Cigarettes    Quit date: 05/25/2001    Years since quitting: 21.0   Smokeless tobacco: Former    Types: Chew, Snuff    Quit date: 09/22/2001  Vaping Use   Vaping Use: Never used  Substance and Sexual Activity   Alcohol use: Not Currently   Drug use: No   Sexual activity: Not on file  Other Topics Concern   Not on file  Social History Narrative   Lives with wife   Caffeine use: Coffee daily (1-2 per day)   Some soda    Right handed    Social Determinants of Health   Financial Resource Strain: Not on file  Food Insecurity: Not on file  Transportation Needs: Not on file  Physical Activity: Not on file  Stress: Not on file  Social Connections: Not on file   Past Surgical History:  Procedure Laterality Date   ANTERIOR CERVICAL DECOMP/DISCECTOMY FUSION  09-19-2010    dr Ronnald Ramp  '@[facility]'$    COLONOSCOPY W/ POLYPECTOMY  last one 03/ 2019   CYSTOSCOPY N/A 05/17/2018   Procedure: CYSTOSCOPY/ REMOVAL OF BLADDER CALCULUS;  Surgeon: [name], MD;  [facility];  Service: Urology;  Laterality: N/A;  ONLY NEEDS 45 MIN   FINGER SURGERY  1970s   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 RIGHT MIDDLE FINGER   IMPLANTATION BONE ANCHORED HEARING AID Right 2011    '@[facility]'$    "no longer have this"   LYMPHADENECTOMY Bilateral 08/04/2013   Procedure: LYMPHADENECTOMY;  Surgeon: Dutch Gray, MD;  [facility];  Service: Urology;  Laterality: Bilateral;   PROSTATE SURGERY     ROBOT ASSISTED LAPAROSCOPIC RADICAL PROSTATECTOMY N/A 08/04/2013   Procedure: ROBOTIC ASSISTED LAPAROSCOPIC RADICAL PROSTATECTOMY LEVEL 2;  Surgeon:  Dutch Gray, MD;  [facility];  Service: Urology;  Laterality: N/A;   Past Surgical History:  Procedure Laterality Date   ANTERIOR CERVICAL DECOMP/DISCECTOMY FUSION  09-19-2010    dr Ronnald Ramp  '@[facility]'$    COLONOSCOPY W/ POLYPECTOMY  last one 03/ 2019   CYSTOSCOPY N/A 05/17/2018   Procedure: CYSTOSCOPY/ REMOVAL OF BLADDER CALCULUS;  Surgeon: [name], MD;  [facility];  Service: Urology;  Laterality: N/A;  ONLY NEEDS 45 MIN   FINGER SURGERY  1970s   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 RIGHT MIDDLE FINGER   IMPLANTATION BONE ANCHORED HEARING AID Right 2011    '@[facility]'$    "no longer have this"   LYMPHADENECTOMY Bilateral 08/04/2013   Procedure: LYMPHADENECTOMY;  Surgeon: Dutch Gray, MD;  [facility];  Service: Urology;  Laterality: Bilateral;    PROSTATE SURGERY     ROBOT ASSISTED LAPAROSCOPIC RADICAL PROSTATECTOMY N/A 08/04/2013   Procedure: ROBOTIC ASSISTED LAPAROSCOPIC RADICAL PROSTATECTOMY LEVEL 2;  Surgeon: Dutch Gray, MD;  [facility];  Service: Urology;  Laterality: N/A;   Past Medical History:  Diagnosis Date   Anticoagulated    xarelto   Benign essential tremor 10/12/2019   Depression    Gait abnormality    MILD --- DOES NOT USE CANE   Hearing loss    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   History of kidney stones    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 ,OCCIPITAL SKULL FRACTURE-- RESIDUAL MILD MEMORY ISSUES AND RIGHT SIDE HEARING LOSS (PT FELL ON HEAD FROM 6 FT LADDER)   Hyperlipidemia    Hypertension    Mild memory disturbance    DUE TO TBI , 10-2008   (per pt mild)   Nocturia    twice   Prostate cancer ([place])    UROLOGIST-- DR [name]---  Stage T2a, Gleason 4+3, PSA 5.4,  s/p  radical prostatectomy 2015;   05-14-2018  per pt PSA undetectable   Pulmonary embolism, bilateral ([place]) 12/02/2017   currently taking xarelto   Wears dentures    upper   Wears glasses    Wears hearing aid in both ears    There were no vitals taken for this visit.  Opioid Risk Score:   Fall Risk Score:  `1  Depression screen [facility] 2/9     05/13/2022   10:08 AM 02/12/2022   10:27 AM 01/13/2022   12:50 PM 12/11/2021   12:58 PM 11/06/2021    3:03 PM 10/07/2021    9:25 AM 08/30/2021    3:01 PM  Depression screen [facility] 2/9  Decreased Interest 0 0 0 0 0 0 0  Down, Depressed, Hopeless 0 0 0 0 0 0 0  PHQ - 2 Score 0 0 0 0 0 0 0    Review of Systems  Musculoskeletal:  Positive for back pain and neck pain.  All other systems reviewed and are negative.      Objective:   Physical Exam Vitals and nursing note reviewed.  Constitutional:      Appearance: Normal appearance.  Neck:     Comments: Cervical Paraspinal Tenderness: C-5-C-6 Cardiovascular:     Rate and Rhythm: Normal rate and regular  rhythm.     Pulses: Normal pulses.     Heart sounds: Normal heart sounds.  Pulmonary:     Effort: Pulmonary effort is normal.     Breath sounds: Normal breath sounds.  Musculoskeletal:     Cervical back: Normal  range of motion and neck supple.     Comments: Normal Muscle Bulk and Muscle Testing Reveals:  Upper Extremities: Full ROM and Muscle Strength 5/5 Bilateral AC Joint Tenderness  Thoracic Paraspinal Tenderness: T-7-T-9 Lumbar Paraspinal Tenderness: L-4-L-5 Lower Extremities Full ROM and Muscle Strength 5/5 Arises from Table slowly Narrow Based Gait     Skin:    General: Skin is warm and dry.  Neurological:     Mental Status: He is alert and oriented to person, place, and time.  Psychiatric:        Mood and Affect: Mood normal.        Behavior: Behavior normal.         Assessment & Plan:  1.Cervical Postlaminectomy: Continue HEP and Continue current medication regime. 06/13/2022. 2. Cervicalgia/ Cervical Radiculitis: Continue current medication regime. Continue to Monitor. 06/13/2022 3. Chronic Bilateral Shoulder Pain:  Continue HEP as Tolerated. Continue to monitor.06/13/2022 4. L2 compression fracture/ Chronic Bilateral Low Back Pain without Sciatica: Continue to Monitor:06/13/2022  Refilled: Oxycodone 5  mg  one tablet every 8 hours as needed for moderate pain. # 90. .  We will continue the opioid monitoring program, this consists of regular clinic visits, examinations, urine drug screen, pill counts as well as use of [place] Controlled Substance Reporting system. A 12 month History has been reviewed on the [place] on 06/13/2022.  5. Polyarthralgia: No complaints today. Continue to Monitor. 06/13/2022 6. Chronic Bilateral Thoracic Pain:Continue HEP as Tolerated. Continue  Current medication regimen. Continue to Monitor. 06/13/2022   F/U in 1 month

## 2022-06-13 ENCOUNTER — Encounter: Payer: Self-pay | Admitting: Registered Nurse

## 2022-06-13 ENCOUNTER — Encounter: Payer: Medicare Other | Attending: Physical Medicine & Rehabilitation | Admitting: Registered Nurse

## 2022-06-13 VITALS — BP 98/67 | HR 65 | Ht 69.0 in | Wt 183.0 lb

## 2022-06-13 DIAGNOSIS — M545 Low back pain, unspecified: Secondary | ICD-10-CM

## 2022-06-13 DIAGNOSIS — M546 Pain in thoracic spine: Secondary | ICD-10-CM

## 2022-06-13 DIAGNOSIS — M5412 Radiculopathy, cervical region: Secondary | ICD-10-CM | POA: Diagnosis not present

## 2022-06-13 DIAGNOSIS — Z5181 Encounter for therapeutic drug level monitoring: Secondary | ICD-10-CM | POA: Insufficient documentation

## 2022-06-13 DIAGNOSIS — Z79891 Long term (current) use of opiate analgesic: Secondary | ICD-10-CM

## 2022-06-13 DIAGNOSIS — G8929 Other chronic pain: Secondary | ICD-10-CM | POA: Diagnosis not present

## 2022-06-13 DIAGNOSIS — G894 Chronic pain syndrome: Secondary | ICD-10-CM | POA: Insufficient documentation

## 2022-06-13 DIAGNOSIS — M542 Cervicalgia: Secondary | ICD-10-CM | POA: Diagnosis not present

## 2022-06-13 MED ORDER — OXYCODONE HCL 5 MG PO TABS: 5.0000 mg | ORAL_TABLET | Freq: Three times a day (TID) | ORAL | 0 refills | Status: DC | PRN

## 2022-07-02 DIAGNOSIS — G300 Alzheimer's disease with early onset: Secondary | ICD-10-CM | POA: Diagnosis not present

## 2022-08-06 ENCOUNTER — Encounter: Payer: Medicare Other | Attending: Physical Medicine & Rehabilitation | Admitting: Registered Nurse

## 2022-08-06 ENCOUNTER — Encounter: Payer: Self-pay | Admitting: Registered Nurse

## 2022-08-06 VITALS — BP 117/77 | HR 57 | Ht 69.0 in | Wt 185.0 lb

## 2022-08-06 DIAGNOSIS — G8929 Other chronic pain: Secondary | ICD-10-CM | POA: Diagnosis not present

## 2022-08-06 DIAGNOSIS — Z79891 Long term (current) use of opiate analgesic: Secondary | ICD-10-CM | POA: Diagnosis not present

## 2022-08-06 DIAGNOSIS — G894 Chronic pain syndrome: Secondary | ICD-10-CM | POA: Insufficient documentation

## 2022-08-06 DIAGNOSIS — Z5181 Encounter for therapeutic drug level monitoring: Secondary | ICD-10-CM | POA: Diagnosis not present

## 2022-08-06 DIAGNOSIS — C44319 Basal cell carcinoma of skin of other parts of face: Secondary | ICD-10-CM | POA: Diagnosis not present

## 2022-08-06 DIAGNOSIS — M545 Low back pain, unspecified: Secondary | ICD-10-CM | POA: Diagnosis not present

## 2022-08-06 DIAGNOSIS — M5412 Radiculopathy, cervical region: Secondary | ICD-10-CM | POA: Diagnosis not present

## 2022-08-06 DIAGNOSIS — D485 Neoplasm of uncertain behavior of skin: Secondary | ICD-10-CM | POA: Diagnosis not present

## 2022-08-06 DIAGNOSIS — L570 Actinic keratosis: Secondary | ICD-10-CM | POA: Diagnosis not present

## 2022-08-06 DIAGNOSIS — C44311 Basal cell carcinoma of skin of nose: Secondary | ICD-10-CM | POA: Diagnosis not present

## 2022-08-06 DIAGNOSIS — M542 Cervicalgia: Secondary | ICD-10-CM | POA: Insufficient documentation

## 2022-08-06 DIAGNOSIS — Z85828 Personal history of other malignant neoplasm of skin: Secondary | ICD-10-CM | POA: Diagnosis not present

## 2022-08-06 DIAGNOSIS — L859 Epidermal thickening, unspecified: Secondary | ICD-10-CM | POA: Diagnosis not present

## 2022-08-06 DIAGNOSIS — M4802 Spinal stenosis, cervical region: Secondary | ICD-10-CM | POA: Insufficient documentation

## 2022-08-06 DIAGNOSIS — L720 Epidermal cyst: Secondary | ICD-10-CM | POA: Diagnosis not present

## 2022-08-06 MED ORDER — OXYCODONE HCL 5 MG PO TABS: 5.0000 mg | ORAL_TABLET | Freq: Three times a day (TID) | ORAL | 0 refills | Status: DC | PRN

## 2022-08-06 NOTE — Progress Notes (Signed)
Subjective:    Patient ID: Fred [name], male    DOB: January 21, 1951, 72 y.o.   MRN: [id number]  HPI: Fred [name] is a 72 y.o. male who returns for follow up appointment for chronic pain and medication refill. He states his pain is located in hisneck radiating into his bilateral shoulders and lower back pain. He  rates his pain 6. His current exercise regime is walking and performing stretching exercises.  Fred [name] Morphine equivalent is 22.50 MME.   UDS ordered today.   Fred [name] in the room all questions answered. Thay will be out of town in April, he will return in May, they verbalize understanding.    Pain Inventory Average Pain 5 Pain Right Now 6 My pain is intermittent, sharp, burning, stabbing, tingling, and aching  In the last 24 hours, has pain interfered with the following? General activity 5 Relation with others 6 Enjoyment of life 5 What TIME of day is your pain at its worst? daytime and evening Sleep (in general) Good  Pain is worse with: walking, bending, sitting, inactivity, standing, and some activites Pain improves with: rest and medication Relief from Meds: 5  Family History  Problem Relation Age of Onset   Stroke Mother    Dementia Mother    Heart disease Father    Stroke Father        heat stroke   Kidney disease Brother    Dementia Brother    Social History   Socioeconomic History   Marital status: Married    Spouse name: Not on file   Number of children: 6   Years of education: 12+   Highest education level: Not on file  Occupational History   Occupation: Retired  Tobacco Use   Smoking status: Former    Packs/day: 1.00    Years: 30.00    Additional pack years: 0.00    Total pack years: 30.00    Types: Cigarettes    Quit date: 05/25/2001    Years since quitting: 21.2   Smokeless tobacco: Former    Types: Chew, Snuff    Quit date: 09/22/2001  Vaping Use   Vaping Use: Never used  Substance and Sexual Activity   Alcohol use:  Not Currently   Drug use: No   Sexual activity: Not on file  Other Topics Concern   Not on file  Social History Narrative   Lives with wife   Caffeine use: Coffee daily (1-2 per day)   Some soda   Right handed    Social Determinants of Health   Financial Resource Strain: Not on file  Food Insecurity: Not on file  Transportation Needs: Not on file  Physical Activity: Not on file  Stress: Not on file  Social Connections: Not on file   Past Surgical History:  Procedure Laterality Date   ANTERIOR CERVICAL DECOMP/DISCECTOMY FUSION  09-19-2010    dr Ronnald Ramp  @[facility]    COLONOSCOPY W/ POLYPECTOMY  last one 03/ 2019   CYSTOSCOPY N/A 05/17/2018   Procedure: CYSTOSCOPY/ REMOVAL OF BLADDER CALCULUS;  Surgeon: [name], MD;  [facility];  Service: Urology;  Laterality: N/A;  ONLY NEEDS 45 MIN   FINGER SURGERY  1970s   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 RIGHT MIDDLE FINGER   IMPLANTATION BONE ANCHORED HEARING AID Right 2011    @[facility]    "no longer have this"   LYMPHADENECTOMY Bilateral 08/04/2013   Procedure: LYMPHADENECTOMY;  Surgeon: Dutch Gray, MD;  [facility];  Service: Urology;  Laterality: Bilateral;   PROSTATE SURGERY     ROBOT ASSISTED LAPAROSCOPIC RADICAL PROSTATECTOMY N/A 08/04/2013   Procedure: ROBOTIC ASSISTED LAPAROSCOPIC RADICAL PROSTATECTOMY LEVEL 2;  Surgeon: Dutch Gray, MD;  [facility];  Service: Urology;  Laterality: N/A;   Past Surgical History:  Procedure Laterality Date   ANTERIOR CERVICAL DECOMP/DISCECTOMY FUSION  09-19-2010    dr Ronnald Ramp  @[facility]    COLONOSCOPY W/ POLYPECTOMY  last one 03/ 2019   CYSTOSCOPY N/A 05/17/2018   Procedure: CYSTOSCOPY/ REMOVAL OF BLADDER CALCULUS;  Surgeon: [name], MD;  [facility];  Service: Urology;  Laterality: N/A;  ONLY NEEDS 45 MIN   FINGER SURGERY  1970s   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 RIGHT MIDDLE FINGER   IMPLANTATION BONE ANCHORED HEARING AID  Right 2011    @[facility]    "no longer have this"   LYMPHADENECTOMY Bilateral 08/04/2013   Procedure: LYMPHADENECTOMY;  Surgeon: Dutch Gray, MD;  [facility];  Service: Urology;  Laterality: Bilateral;   PROSTATE SURGERY     ROBOT ASSISTED LAPAROSCOPIC RADICAL PROSTATECTOMY N/A 08/04/2013   Procedure: ROBOTIC ASSISTED LAPAROSCOPIC RADICAL PROSTATECTOMY LEVEL 2;  Surgeon: Dutch Gray, MD;  [facility];  Service: Urology;  Laterality: N/A;   Past Medical History:  Diagnosis Date   Anticoagulated    xarelto   Benign essential tremor 10/12/2019   Depression    Gait abnormality    MILD --- DOES NOT USE CANE   Hearing loss    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   History of kidney stones    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 ,OCCIPITAL SKULL FRACTURE-- RESIDUAL MILD MEMORY ISSUES AND RIGHT SIDE HEARING LOSS (PT FELL ON HEAD FROM 6 FT LADDER)   Hyperlipidemia    Hypertension    Mild memory disturbance    DUE TO TBI , 10-2008   (per pt mild)   Nocturia    twice   Prostate cancer ([place])    UROLOGIST-- DR [name]---  Stage T2a, Gleason 4+3, PSA 5.4,  s/p  radical prostatectomy 2015;   05-14-2018  per pt PSA undetectable   Pulmonary embolism, bilateral ([place]) 12/02/2017   currently taking xarelto   Wears dentures    upper   Wears glasses    Wears hearing aid in both ears    There were no vitals taken for this visit.  Opioid Risk Score:   Fall Risk Score:  `1  Depression screen [facility] 2/9     05/13/2022   10:08 AM 02/12/2022   10:27 AM 01/13/2022   12:50 PM 12/11/2021   12:58 PM 11/06/2021    3:03 PM 10/07/2021    9:25 AM 08/30/2021    3:01 PM  Depression screen [facility] 2/9  Decreased Interest 0 0 0 0 0 0 0  Down, Depressed, Hopeless 0 0 0 0 0 0 0  PHQ - 2 Score 0 0 0 0 0 0 0      Review of Systems  Musculoskeletal:  Positive for back pain and neck pain.       B/L shoulder, arms and feet.  All other systems reviewed and are negative.       Objective:   Physical Exam Vitals and nursing note reviewed.  Constitutional:      Appearance: Normal appearance.  Cardiovascular:     Rate and Rhythm: Normal rate and regular rhythm.     Pulses: Normal pulses.  Heart sounds: Normal heart sounds.  Pulmonary:     Effort: Pulmonary effort is normal.     Breath sounds: Normal breath sounds.  Musculoskeletal:     Cervical back: Normal range of motion and neck supple.     Comments: Normal Muscle Bulk and Muscle Testing Reveals:  Upper Extremities: Full ROM and Muscle Strength 5/5 Lumbar Paraspinal Tenderness: L- 4- L-5  Lower Extremities: Full ROM and Muscle Strength 5/5 Arises from Table with ease Narrow Based  Gait     Skin:    General: Skin is warm and dry.  Neurological:     Mental Status: He is alert and oriented to person, place, and time.  Psychiatric:        Mood and Affect: Mood normal.        Behavior: Behavior normal.         Assessment & Plan:  1.Cervical Postlaminectomy: Continue HEP and Continue current medication regime. 08/06/2022. 2. Cervicalgia/ Cervical Radiculitis: Continue current medication regime. Continue to Monitor. 08/06/2022 3. Chronic Bilateral Shoulder Pain:  Continue HEP as Tolerated. Continue to monitor.08/06/2022 4. L2 compression fracture/ Chronic Bilateral Low Back Pain without Sciatica: Continue to Monitor:08/06/2022  Refilled: Oxycodone 5  mg  one tablet every 8 hours as needed for moderate pain. # 90. .  We will continue the opioid monitoring program, this consists of regular clinic visits, examinations, urine drug screen, pill counts as well as use of [place] Controlled Substance Reporting system. A 12 month History has been reviewed on the [place] on 08/06/2022.  5. Polyarthralgia: No complaints today. Continue to Monitor. 08/06/2022 6. Chronic Bilateral Thoracic Pain:No complaints today. Continue HEP as Tolerated. Continue  Current  medication regimen. Continue to Monitor. 08/06/2022   F/U in 1 month

## 2022-08-11 LAB — TOXASSURE SELECT,+ANTIDEPR,UR

## 2022-08-22 ENCOUNTER — Telehealth: Payer: Self-pay | Admitting: *Deleted

## 2022-08-22 NOTE — Telephone Encounter (Signed)
Urine drug screen for this encounter is consistent for prescribed medication 

## 2022-09-30 ENCOUNTER — Ambulatory Visit: Payer: Medicare Other | Admitting: Neurology

## 2022-10-06 ENCOUNTER — Encounter: Payer: Medicare Other | Attending: Physical Medicine & Rehabilitation | Admitting: Registered Nurse

## 2022-10-06 ENCOUNTER — Encounter: Payer: Self-pay | Admitting: Neurology

## 2022-10-06 ENCOUNTER — Ambulatory Visit: Payer: Medicare Other | Admitting: Neurology

## 2022-10-06 ENCOUNTER — Encounter: Payer: Self-pay | Admitting: Registered Nurse

## 2022-10-06 VITALS — BP 113/73 | HR 69 | Ht 68.0 in | Wt 178.4 lb

## 2022-10-06 VITALS — BP 104/68 | HR 65 | Ht 69.0 in | Wt 179.2 lb

## 2022-10-06 DIAGNOSIS — M542 Cervicalgia: Secondary | ICD-10-CM | POA: Diagnosis not present

## 2022-10-06 DIAGNOSIS — G8929 Other chronic pain: Secondary | ICD-10-CM | POA: Insufficient documentation

## 2022-10-06 DIAGNOSIS — Z79891 Long term (current) use of opiate analgesic: Secondary | ICD-10-CM | POA: Insufficient documentation

## 2022-10-06 DIAGNOSIS — F03B4 Unspecified dementia, moderate, with anxiety: Secondary | ICD-10-CM

## 2022-10-06 DIAGNOSIS — Z5181 Encounter for therapeutic drug level monitoring: Secondary | ICD-10-CM | POA: Insufficient documentation

## 2022-10-06 DIAGNOSIS — M545 Low back pain, unspecified: Secondary | ICD-10-CM | POA: Diagnosis not present

## 2022-10-06 DIAGNOSIS — F0390 Unspecified dementia without behavioral disturbance: Secondary | ICD-10-CM | POA: Insufficient documentation

## 2022-10-06 DIAGNOSIS — G250 Essential tremor: Secondary | ICD-10-CM

## 2022-10-06 DIAGNOSIS — M5412 Radiculopathy, cervical region: Secondary | ICD-10-CM | POA: Insufficient documentation

## 2022-10-06 DIAGNOSIS — G894 Chronic pain syndrome: Secondary | ICD-10-CM | POA: Diagnosis not present

## 2022-10-06 MED ORDER — OXYCODONE HCL 5 MG PO TABS: 5.0000 mg | ORAL_TABLET | Freq: Three times a day (TID) | ORAL | 0 refills | Status: DC | PRN

## 2022-10-06 NOTE — Progress Notes (Signed)
Subjective:    Patient ID: Fred [name], male    DOB: 05-Jun-1950, 72 y.o.   MRN: [id number]  HPI: Fred [name] is a 72 y.o. male who returns for follow up appointment for chronic pain and medication refill. He states his pain is located in his neck radiating into his bilateral shoulders and lower back. He rates his pain 4. His current exercise regime is walking and performing stretching exercises.  Ms. Neira Morphine equivalent is 21.75 MME.   Last UDS was Performed on 08/06/2022, it was consistent.     Pain Inventory Average Pain 5 Pain Right Now 4 My pain is sharp, stabbing, and aching  In the last 24 hours, has pain interfered with the following? General activity 5 Relation with others 5 Enjoyment of life 6 What TIME of day is your pain at its worst? daytime and evening Sleep (in general) Fair  Pain is worse with: walking, bending, standing, and some activites Pain improves with: medication Relief from Meds: 4  Family History  Problem Relation Age of Onset   Stroke Mother    Dementia Mother    Heart disease Father    Stroke Father        heat stroke   Kidney disease Brother    Dementia Brother    Social History   Socioeconomic History   Marital status: Married    Spouse name: Not on file   Number of children: 6   Years of education: 12+   Highest education level: Not on file  Occupational History   Occupation: Retired  Tobacco Use   Smoking status: Former    Packs/day: 1.00    Years: 30.00    Additional pack years: 0.00    Total pack years: 30.00    Types: Cigarettes    Quit date: 05/25/2001    Years since quitting: 21.3   Smokeless tobacco: Former    Types: Chew, Snuff    Quit date: 09/22/2001  Vaping Use   Vaping Use: Never used  Substance and Sexual Activity   Alcohol use: Not Currently   Drug use: No   Sexual activity: Not on file  Other Topics Concern   Not on file  Social History Narrative   Lives with wife   Caffeine use: Coffee  daily (1-2 per day)   Some soda   Right handed    Social Determinants of Health   Financial Resource Strain: Not on file  Food Insecurity: Not on file  Transportation Needs: Not on file  Physical Activity: Not on file  Stress: Not on file  Social Connections: Not on file   Past Surgical History:  Procedure Laterality Date   ANTERIOR CERVICAL DECOMP/DISCECTOMY FUSION  09-19-2010    dr Yetta Barre  @[facility]    COLONOSCOPY W/ POLYPECTOMY  last one 03/ 2019   CYSTOSCOPY N/A 05/17/2018   Procedure: CYSTOSCOPY/ REMOVAL OF BLADDER CALCULUS;  Surgeon: Heloise Purpura, MD;  Location: [facility];  Service: Urology;  Laterality: N/A;  ONLY NEEDS 45 MIN   FINGER SURGERY  1970s   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 RIGHT MIDDLE FINGER   IMPLANTATION BONE ANCHORED HEARING AID Right 2011    @[facility]    "no longer have this"   LYMPHADENECTOMY Bilateral 08/04/2013   Procedure: LYMPHADENECTOMY;  Surgeon: Crecencio Mc, MD;  Location: [facility];  Service: Urology;  Laterality: Bilateral;   PROSTATE SURGERY     ROBOT ASSISTED LAPAROSCOPIC RADICAL PROSTATECTOMY N/A 08/04/2013   Procedure: ROBOTIC ASSISTED LAPAROSCOPIC  RADICAL PROSTATECTOMY LEVEL 2;  Surgeon: Crecencio Mc, MD;  Location: [facility];  Service: Urology;  Laterality: N/A;   Past Surgical History:  Procedure Laterality Date   ANTERIOR CERVICAL DECOMP/DISCECTOMY FUSION  09-19-2010    dr Yetta Barre  @[facility]    COLONOSCOPY W/ POLYPECTOMY  last one 03/ 2019   CYSTOSCOPY N/A 05/17/2018   Procedure: CYSTOSCOPY/ REMOVAL OF BLADDER CALCULUS;  Surgeon: Heloise Purpura, MD;  Location: [facility];  Service: Urology;  Laterality: N/A;  ONLY NEEDS 45 MIN   FINGER SURGERY  1970s   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 RIGHT MIDDLE FINGER   IMPLANTATION BONE ANCHORED HEARING AID Right 2011    @[facility]    "no longer have this"   LYMPHADENECTOMY Bilateral 08/04/2013   Procedure: LYMPHADENECTOMY;  Surgeon: Crecencio Mc, MD;  Location: [facility];  Service:  Urology;  Laterality: Bilateral;   PROSTATE SURGERY     ROBOT ASSISTED LAPAROSCOPIC RADICAL PROSTATECTOMY N/A 08/04/2013   Procedure: ROBOTIC ASSISTED LAPAROSCOPIC RADICAL PROSTATECTOMY LEVEL 2;  Surgeon: Crecencio Mc, MD;  Location: [facility];  Service: Urology;  Laterality: N/A;   Past Medical History:  Diagnosis Date   Anticoagulated    xarelto   Benign essential tremor 10/12/2019   Depression    Gait abnormality    MILD --- DOES NOT USE CANE   Hearing loss    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   History of kidney stones    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 ,OCCIPITAL SKULL FRACTURE-- RESIDUAL MILD MEMORY ISSUES AND RIGHT SIDE HEARING LOSS (PT FELL ON HEAD FROM 6 FT LADDER)   Hyperlipidemia    Hypertension    Mild memory disturbance    DUE TO TBI , 10-2008   (per pt mild)   Nocturia    twice   Prostate cancer (HCC)    UROLOGIST-- DR Laverle Patter---  Stage T2a, Gleason 4+3, PSA 5.4,  s/p  radical prostatectomy 2015;   05-14-2018  per pt PSA undetectable   Pulmonary embolism, bilateral (HCC) 12/02/2017   currently taking xarelto   Wears dentures    upper   Wears glasses    Wears hearing aid in both ears    BP 104/68   Pulse 65   Ht 5\' 9"  (1.753 m)   Wt 179 lb 3.2 oz (81.3 kg)   SpO2 98%   BMI 26.46 kg/m   Opioid Risk Score:   Fall Risk Score:  `1  Depression screen [facility] 2/9     10/06/2022    8:32 AM 05/13/2022   10:08 AM 02/12/2022   10:27 AM 01/13/2022   12:50 PM 12/11/2021   12:58 PM 11/06/2021    3:03 PM 10/07/2021    9:25 AM  Depression screen [facility] 2/9  Decreased Interest 0 0 0 0 0 0 0  Down, Depressed, Hopeless 0 0 0 0 0 0 0  PHQ - 2 Score 0 0 0 0 0 0 0     Review of Systems  Constitutional: Negative.   HENT: Negative.    Eyes: Negative.   Respiratory: Negative.    Cardiovascular: Negative.   Gastrointestinal: Negative.   Endocrine: Negative.   Genitourinary: Negative.   Musculoskeletal:  Positive for back pain,  myalgias and neck pain.  Skin: Negative.   Allergic/Immunologic: Negative.   Neurological: Negative.   Hematological: Negative.   Psychiatric/Behavioral: Negative.    All other systems reviewed and are negative.      Objective:  Physical Exam Vitals and nursing note reviewed.  Constitutional:      Appearance: Normal appearance.  Cardiovascular:     Rate and Rhythm: Normal rate and regular rhythm.     Pulses: Normal pulses.     Heart sounds: Normal heart sounds.  Pulmonary:     Effort: Pulmonary effort is normal.     Breath sounds: Normal breath sounds.  Musculoskeletal:     Cervical back: Normal range of motion and neck supple.     Comments: Normal Muscle Bulk and Muscle Testing Reveals:  Upper Extremities: Full ROM and Muscle Strength 5/5 Bilateral AC Joint Tenderness Lumbar Paraspinal Tenderness: L-4-L-5 Lower Extremities: Full ROM and Muscle Strength 5/5 Arises from Table with Ease  Narrow Based  Gait     Skin:    General: Skin is warm and dry.  Neurological:     Mental Status: He is alert and oriented to person, place, and time.  Psychiatric:        Mood and Affect: Mood normal.        Behavior: Behavior normal.         Assessment & Plan:  1.Cervical Postlaminectomy: Continue HEP and Continue current medication regime. 10/06/2022. 2. Cervicalgia/ Cervical Radiculitis: Continue current medication regime. Continue to Monitor. 10/06/2022 3. Chronic Bilateral Shoulder Pain:  Continue HEP as Tolerated. Continue to monitor.10/06/2022 4. L2 compression fracture/ Chronic Bilateral Low Back Pain without Sciatica: Continue to Monitor:10/06/2022  Refilled: Oxycodone 5  mg  one tablet every 8 hours as needed for moderate pain. # 90. Marland Kitchen Second script sent to pharmacy for June.  We will continue the opioid monitoring program, this consists of regular clinic visits, examinations, urine drug screen, pill counts as well as use of [place] Controlled Substance Reporting  system. A 12 month History has been reviewed on the [place] Controlled Substance Reporting System on 10/06/2022.  5. Polyarthralgia: No complaints today. Continue to Monitor. 10/06/2022 6. Chronic Bilateral Thoracic Pain:No complaints today. Continue HEP as Tolerated. Continue  Current medication regimen. Continue to Monitor. 10/06/2022   F/U in 2 months  Mr. And Mrs. Eckert going out of town, and was given permission to come every 2 months. Mr. Tsuchida assist mr. Anastas with his medication.

## 2022-10-06 NOTE — Progress Notes (Signed)
PATIENT: Fred [name] DOB: 05-05-1951  REASON FOR VISIT: follow up for tremor, memory disturbance HISTORY FROM: patient, wife PRIMARY NEUROLOGIST: Dr. Frances Furbish  HISTORY OF PRESENT ILLNESS: Today 10/06/22 MMSE 19/30 today. Here with his wife. Memory is not as good. Poor short term medication. Wife to has to remind him to take medication. Is no longer driving, issued silver alert in January, he was gone 17 hours, found him in [place]. Does well with names. Does ADLs. Does household chores, keeps a list. Today is going to wash the car, do lawn work. Sleeping okay and eating well. Takes oxycodone for back pain. Still on Aricept in AM/Namenda. Having some afternoon anxiety. Takes Risperdal now, PCP started, may increase. Has not had any issues with tremor.   Update 09/24/21 SS: Fred [name] is here today for follow-up. MMSE 23/30, no change. Wife thinks memory is slightly worse, relies on his wife for more day to day questioning. He very rarely drives, he lives in [address] town. He sees pain management, takes oxycodone. Wife makes weekly pill boxes, he takes them, but need reminders. He mows the lawn. Sleeps well. Denies dreams. No falls. Watches TV often, crime shows. Tremor in both hands, intermittently, has improved. Rarely takes propranolol for tremor. Takes Aricept in AM, Namenda 10 mg twice daily.   Update 10/30/2020 SS: Fred [name] is a 72 year old male with history of tremor to both hands, memory disturbance. MMSE 23/30 today.  Remains on Aricept 10 mg, not sure if taking AM/PM, PCP just increased Namenda 10 mg daily.  Reports feeling less fatigued, takes propanolol twice weekly as needed for tremor.  Tremor is both hands, with action. With memory, may forget what he went into a room for.  His wife does most of the driving, he only does short distances.  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 He mows the lawn. Sees pain management for chronic back pain, on  oxycodone.  No falls, he does walk with a stagger, but has been that way. Has dreams at night of coal mining.  Here today with his wife. They deny any concerns.   Update 04/18/2020 SS: Fred [name] is a 72 year old male with history of mild memory disturbance, noted tremor to both hands in early 2021.  Currently taking propanolol 40 mg as needed for tremor, Aricept 10 mg daily, Namenda 5 mg daily.  For the last 1 month, has noted fatigue, heart rate in the 40s.  Around that time, Namenda 5 mg was added, propanolol was switched to as needed.  1 week ago, amlodipine 5 mg daily was started.  His wife usually gives him propanolol 3-4 times a week when she notes tremor.  Tremors in both hands, greater in the right, most notable with eating.  Memory is overall stable.  Does little driving, mostly because he and his wife are always together, he navigates.  More still has benign forgetfulness.  Has been more sleepy, sleeping 14 hours a day.  Has noted a few headaches.  Is on oxycodone for chronic back pain.  Presents today for evaluation accompanied by his wife.  MMSE 25/30 today.  HISTORY  10/12/2019 Dr. Anne Hahn: Fred [name] is a 72 year old right-handed white male with a history of a mild memory disturbance.  The patient was last seen through this office about 2 years ago, he has been maintained on Aricept to 10 mg at night.  He has tolerated the medication well.  Over the last  2 years he has had some slight increased problems with forgetfulness.  The is still able to operate a motor vehicle, he denies any difficulty with safety issues or with directions.  He indicates that his wife helps him out with keeping up with medications and appointments, she does the finances and always has.  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  He has a very strong family history of Alzheimer's disease with dementia affecting his mother and a brother and a sister.  Over  the last 6 months he has noted some intermittent tremor that affects both hands equally.  The tremor is present with activity of the hands, not present at rest.  He denies any tremor affecting the jaw or the head or neck or any vocal tremor.  He is not having a lot of troubles feeding himself or performing handwriting.  The patient has had some issues with sleeping quite a bit during the day, he will go to bed and sleep 12 hours, sometimes up to 15 hours a day.  The patient has a good energy level once he is awake during the day and he remains active.  He reports some bilateral foot numbness following a frostbite injury many years ago.  He does note some slight gait instability, he has not had any falls.  He reports no other focal numbness or weakness of the extremities.  He is sent to this office for further evaluation.   REVIEW OF SYSTEMS: Out of a complete 14 system review of symptoms, the patient complains only of the following symptoms, and all other reviewed systems are negative.  See HPI  ALLERGIES: Allergies  Allergen Reactions   Acetaminophen    Gabapentin Hives and Nausea And Vomiting    08/01/13  PT DENIES  Other Reaction(s): GI Intolerance   Nsaids     Other reaction(s): chronic kidney disease   Oxycodone Hcl    Oxycodone-Aspirin Hives and Nausea And Vomiting    Per wife " this was 40 yrs ago"  Approx.  1970s  Other Reaction(s): GI Intolerance   Pregabalin Other (See Comments)    Falls asleep while talking to people  Other reaction(s): significant drowsiness  Other Reaction(s): Fatique   Sulfa Antibiotics     Other reaction(s): Unknown    HOME MEDICATIONS: Outpatient Medications Prior to Visit  Medication Sig Dispense Refill   amLODipine (NORVASC) 5 MG tablet Take 5 mg by mouth daily.     atorvastatin (LIPITOR) 80 MG tablet Take 80 mg by mouth daily.     donepezil (ARICEPT) 10 MG tablet Take 10 mg by mouth daily.     DULoxetine (CYMBALTA) 60 MG capsule Take 60 mg by  mouth 2 (two) times daily.      lisinopril (ZESTRIL) 20 MG tablet Take 20 mg by mouth 2 (two) times daily.     memantine (NAMENDA) 10 MG tablet Take 10 mg by mouth 2 (two) times daily.     oxyCODONE (ROXICODONE) 5 MG immediate release tablet Take 1 tablet (5 mg total) by mouth every 8 (eight) hours as needed for severe pain. 90 tablet 0   risperiDONE (RISPERDAL) 1 MG tablet 1 tablet Orally Once a day for 30 days     No facility-administered medications prior to visit.    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 ,OCCIPITAL SKULL FRACTURE-- RESIDUAL MILD MEMORY ISSUES AND RIGHT SIDE HEARING LOSS (PT FELL ON HEAD FROM 6 FT LADDER)   Hyperlipidemia    Hypertension    Mild memory disturbance    DUE TO TBI , 10-2008   (per pt mild)   Nocturia    twice   Prostate cancer (HCC)    UROLOGIST-- DR Laverle Patter---  Stage T2a, Gleason 4+3, PSA 5.4,  s/p  radical prostatectomy 2015;   05-14-2018  per pt PSA undetectable   Pulmonary embolism, bilateral (HCC) 12/02/2017   currently taking xarelto   Wears dentures    upper   Wears glasses    Wears hearing aid in both ears     PAST SURGICAL HISTORY: Past Surgical History:  Procedure Laterality Date   ANTERIOR CERVICAL DECOMP/DISCECTOMY FUSION  09-19-2010    dr Yetta Barre  @[facility]    COLONOSCOPY W/ POLYPECTOMY  last one 03/ 2019   CYSTOSCOPY N/A 05/17/2018   Procedure: CYSTOSCOPY/ REMOVAL OF BLADDER CALCULUS;  Surgeon: Heloise Purpura, MD;  Location: [facility];  Service: Urology;  Laterality: N/A;  ONLY NEEDS 45 MIN   FINGER SURGERY  1970s   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 RIGHT MIDDLE FINGER   IMPLANTATION BONE ANCHORED  HEARING AID Right 2011    @[facility]    "no longer have this"   LYMPHADENECTOMY Bilateral 08/04/2013   Procedure: LYMPHADENECTOMY;  Surgeon: Crecencio Mc, MD;  Location: [facility];  Service: Urology;  Laterality: Bilateral;   PROSTATE SURGERY     ROBOT ASSISTED LAPAROSCOPIC RADICAL PROSTATECTOMY N/A 08/04/2013   Procedure: ROBOTIC ASSISTED LAPAROSCOPIC RADICAL PROSTATECTOMY LEVEL 2;  Surgeon: Crecencio Mc, MD;  Location: [facility];  Service: Urology;  Laterality: N/A;    FAMILY HISTORY: Family History  Problem Relation Age of Onset   Stroke Mother    Dementia Mother    Heart disease Father    Stroke Father        heat stroke   Kidney disease Brother    Dementia Brother     SOCIAL HISTORY: Social History   Socioeconomic History   Marital status: Married    Spouse name: Not on file   Number of children: 6   Years of education: 12+   Highest education level: Not on file  Occupational History   Occupation: Retired  Tobacco Use   Smoking status: Former    Packs/day: 1.00    Years: 30.00    Additional pack years: 0.00    Total pack years: 30.00    Types: Cigarettes    Quit date: 05/25/2001    Years since quitting: 21.3   Smokeless tobacco: Former    Types: Chew, Snuff    Quit date: 09/22/2001  Vaping Use   Vaping Use: Never used  Substance and Sexual Activity   Alcohol use: Not Currently   Drug use: No   Sexual activity: Not Currently  Other Topics Concern   Not on file  Social History Narrative   Lives with wife   Caffeine use: Coffee daily (1-2 per day)   Some soda   Right handed    Social Determinants of Health   Financial Resource Strain: Not on file  Food Insecurity: Not on file  Transportation Needs:  Not on file  Physical Activity: Not on file  Stress: Not on file  Social Connections: Not on file  Intimate Partner Violence: Not on file   PHYSICAL EXAM  Vitals:   10/06/22 1000  BP: 113/73  Pulse: 69  Weight: 178 lb 6.4 oz (80.9 kg)  Height: 5\' 8"  (1.727 m)    Body mass index is 27.13 kg/m.  Generalized: Well developed, in no acute distress     10/06/2022   10:02 AM 09/24/2021    2:19 PM 10/30/2020    9:18 AM  MMSE - Mini Mental State Exam  Orientation to time 1 3 2   Orientation to Place 3 5 4   Registration 2 3 3   Attention/ Calculation 3 3 3   Recall 1 2 2   Language- name 2 objects 2 2 2   Language- repeat 1 1 1   Language- follow 3 step command 3 3 3   Language- read & follow direction 1 1 1   Write a sentence 1 0 1  Copy design 1 0 1  Total score 19 23 23    Neurological examination  Mentation: Alert oriented to time, place, history is provided by both he and his wife. Follows all commands speech and language fluent, is quieter  Cranial nerve II-XII: Pupils were equal round reactive to light. Extraocular movements were full, visual field were full on confrontational test. Facial sensation and strength were normal. Head turning and shoulder shrug were normal and symmetric.  Motor: The motor testing reveals 5 over 5 strength of all 4 extremities. Good symmetric motor tone is noted throughout.  Sensory: Sensory testing is intact to soft touch on all 4 extremities. No evidence of extinction is noted.  Coordination: Cerebellar testing reveals good finger-nose-finger and heel-to-shin bilaterally.  No tremor was noted.  Slight tremor with spiral draw Gait and station: Gait is slightly wide-based, but steady and independent Reflexes: Deep tendon reflexes are symmetric and normal bilaterally.   DIAGNOSTIC DATA (LABS, IMAGING, TESTING) - I reviewed patient records, labs, notes, testing and imaging myself where available.  Lab Results  Component Value Date   WBC 9.9 05/14/2018   HGB 13.9 05/14/2018   HCT 42.9 05/14/2018   MCV 87.7 05/14/2018   PLT 201 05/14/2018      Component Value Date/Time   NA 139 05/14/2018 1419   K 3.7 05/14/2018 1419   CL 102 05/14/2018 1419   CO2 27 05/14/2018 1419   GLUCOSE 85 05/14/2018 1419   BUN 17  05/14/2018 1419   CREATININE 1.10 05/14/2018 1419   CALCIUM 9.1 05/14/2018 1419   PROT 6.4 (L) 12/03/2017 0819   ALBUMIN 3.6 12/03/2017 0819   AST 19 12/03/2017 0819   ALT 20 12/03/2017 0819   ALKPHOS 64 12/03/2017 0819   BILITOT 0.8 12/03/2017 0819   GFRNONAA >60 05/14/2018 1419   GFRAA >60 05/14/2018 1419   No results found for: "CHOL", "HDL", "LDLCALC", "LDLDIRECT", "TRIG", "CHOLHDL" No results found for: "HGBA1C" Lab Results  Component Value Date   VITAMINB12 268 07/22/2017   No results found for: "TSH"  ASSESSMENT AND PLAN 72 y.o. year old male  has a past medical history of Anticoagulated, Benign essential tremor (10/12/2019), Depression, Gait abnormality, Hearing loss, History of kidney stones, History of traumatic brain injury (10/2008), Hyperlipidemia, Hypertension, Mild memory disturbance, Nocturia, Prostate cancer (HCC), Pulmonary embolism, bilateral (HCC) (12/02/2017), Wears dentures, Wears glasses, and Wears hearing aid in both ears. here with:  1.  Dementia, MMSE 19/30 2.  Strong family history of Alzheimer's disease  3.  Tremor, likely essential tremor  -Decline in memory since last seen -Recommend against driving -Continue Aricept and Namenda, prescription coming from PCP -Has been placed on Risperdal for sundowners, recommend follow-up with PCP, unclear benefit, any other options with less side effects available?  Such as switching Cymbalta or adding BuSpar.  They will discuss with PCP -I think they can follow-up on an as-needed basis, if they wish to return here annually  Margie Ege, AGNP-C, DNP 10/06/2022, 10:15 AM [facility] [address] [place], [place] [address] [phone]

## 2022-10-06 NOTE — Patient Instructions (Addendum)
Recommend against driving  Continue the Aricept and Namenda for memory  Discuss anxiety with PCP, potentially consider Buspar? Switching Cymbalta? Risperdal can have side effects  Recommend exercise, brain stimulating activity  See you back in 1 year

## 2022-10-07 ENCOUNTER — Telehealth: Payer: Self-pay | Admitting: Registered Nurse

## 2022-10-07 NOTE — Telephone Encounter (Signed)
Spoke with Dr Wynn Banker regarding Fred [name] coming every 2 months.  He is in agreement, call placed to Mrs. Dudek, no answer. Left message to return the call.

## 2022-10-07 NOTE — Telephone Encounter (Signed)
Follow up every 2 months per Dr. Wynn Banker:   Fred [name] has an appointment in July. If you need to see him sooner please have front desk call to re-schedule. Otherwise he will keep the pending appointment.

## 2022-10-09 ENCOUNTER — Ambulatory Visit: Payer: Medicare Other | Admitting: Neurology

## 2022-10-15 DIAGNOSIS — G9389 Other specified disorders of brain: Secondary | ICD-10-CM | POA: Diagnosis not present

## 2022-10-15 DIAGNOSIS — N1831 Chronic kidney disease, stage 3a: Secondary | ICD-10-CM | POA: Diagnosis not present

## 2022-10-15 DIAGNOSIS — G300 Alzheimer's disease with early onset: Secondary | ICD-10-CM | POA: Diagnosis not present

## 2022-10-15 DIAGNOSIS — R454 Irritability and anger: Secondary | ICD-10-CM | POA: Diagnosis not present

## 2022-10-15 DIAGNOSIS — Z0000 Encounter for general adult medical examination without abnormal findings: Secondary | ICD-10-CM | POA: Diagnosis not present

## 2022-10-15 DIAGNOSIS — E782 Mixed hyperlipidemia: Secondary | ICD-10-CM | POA: Diagnosis not present

## 2022-10-15 DIAGNOSIS — I129 Hypertensive chronic kidney disease with stage 1 through stage 4 chronic kidney disease, or unspecified chronic kidney disease: Secondary | ICD-10-CM | POA: Diagnosis not present

## 2022-10-15 DIAGNOSIS — R251 Tremor, unspecified: Secondary | ICD-10-CM | POA: Diagnosis not present

## 2022-11-24 ENCOUNTER — Telehealth: Payer: Self-pay | Admitting: Registered Nurse

## 2022-11-24 NOTE — Telephone Encounter (Signed)
Patient hasn't taken a pain pill in a month and a half per wife.  Wife would like to know what to do?

## 2022-11-24 NOTE — Telephone Encounter (Signed)
Return Mrs. Fehr call, she reports Fred [name] not asking for the pain medication. She also reports the neurologist stated his Alzheimer's is progressing, emotional support given. His appointment was changed. She verbalizes understanding.

## 2022-11-28 ENCOUNTER — Encounter: Payer: Medicare Other | Admitting: Registered Nurse

## 2023-01-14 ENCOUNTER — Ambulatory Visit: Payer: Medicare Other | Admitting: Registered Nurse

## 2023-04-14 DIAGNOSIS — I10 Essential (primary) hypertension: Secondary | ICD-10-CM | POA: Diagnosis not present

## 2023-04-14 DIAGNOSIS — G300 Alzheimer's disease with early onset: Secondary | ICD-10-CM | POA: Diagnosis not present

## 2023-04-14 DIAGNOSIS — Z23 Encounter for immunization: Secondary | ICD-10-CM | POA: Diagnosis not present

## 2023-04-14 DIAGNOSIS — R809 Proteinuria, unspecified: Secondary | ICD-10-CM | POA: Diagnosis not present

## 2023-04-14 DIAGNOSIS — E782 Mixed hyperlipidemia: Secondary | ICD-10-CM | POA: Diagnosis not present

## 2023-05-11 DIAGNOSIS — I10 Essential (primary) hypertension: Secondary | ICD-10-CM | POA: Diagnosis not present

## 2023-05-11 DIAGNOSIS — Z79899 Other long term (current) drug therapy: Secondary | ICD-10-CM | POA: Diagnosis not present

## 2023-06-12 DIAGNOSIS — I10 Essential (primary) hypertension: Secondary | ICD-10-CM | POA: Diagnosis not present

## 2023-08-17 DIAGNOSIS — L280 Lichen simplex chronicus: Secondary | ICD-10-CM | POA: Diagnosis not present

## 2023-08-17 DIAGNOSIS — Z85828 Personal history of other malignant neoplasm of skin: Secondary | ICD-10-CM | POA: Diagnosis not present

## 2023-08-17 DIAGNOSIS — L821 Other seborrheic keratosis: Secondary | ICD-10-CM | POA: Diagnosis not present

## 2023-10-12 NOTE — Progress Notes (Addendum)
 PATIENT: Fred [name] DOB: [birth year]  REASON FOR VISIT: follow up for tremor, memory disturbance HISTORY FROM: patient, wife PRIMARY NEUROLOGIST: Dr. Omar Bibber  HISTORY OF PRESENT ILLNESS: Today 10/13/23 Today with his wife, MMSE 18/30. Wife reports memory is worse. Goes to the wrong room to get things. Is no longer driving. Most days watches TV, mows the lawn, helps with chores at home. Is rarely left alone. Sleeping well. Remains on aricept  and namenda. Takes risperdal for sundowning with better. He knows family members, is not as talkative. He enjoys social interaction. Doesn't take oxycodone  anymore for back pain. Tremor comes and goes in his hands more in the right, doesn't give propranolol. Will use other hand to calm it down if holding a cup. He has prior brain injury fell off ladder in 2012. 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  10/06/22 SS: MMSE 19/30 today. Here with his wife. Memory is not as good. Poor short term medication. Wife to has to remind him to take medication. Is no longer driving, issued silver alert in January, he was gone 17 hours, found him in [place] . Does well with names. Does ADLs. Does household chores, keeps a list. Today is going to wash the car, do lawn work. Sleeping okay and eating well. Takes oxycodone  for back pain. Still on Aricept  in AM/Namenda. Having some afternoon anxiety. Takes Risperdal now, PCP started, may increase. Has not had any issues with tremor.   Update 09/24/21 SS: Fred [name] is here today for follow-up. MMSE 23/30, no change. Wife thinks memory is slightly worse, relies on his wife for more day to day questioning. He very rarely drives, he lives in [address] town. He sees pain management, takes oxycodone . Wife makes weekly pill boxes, he takes them, but need reminders. He mows the lawn. Sleeps well. Denies dreams. No falls. Watches TV often, crime shows. Tremor in both hands, intermittently, has improved. Rarely takes propranolol for  tremor. Takes Aricept  in AM, Namenda 10 mg twice daily.   Update 10/30/2020 SS: Fred [name] is a 73 year old male with history of tremor to both hands, memory disturbance. MMSE 23/30 today.  Remains on Aricept  10 mg, not sure if taking AM/PM, PCP just increased Namenda 10 mg daily.  Reports feeling less fatigued, takes propanolol twice weekly as needed for tremor.  Tremor is both hands, with action. With memory, may forget what he went into a room for.  His wife does most of the driving, he only does short distances.  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 He mows the lawn. Sees pain management for chronic back pain, on oxycodone .  No falls, he does walk with a stagger, but has been that way. Has dreams at night of coal mining.  Here today with his wife. They deny any concerns.   Update 04/18/2020 SS: Fred [name] is a 73 year old male with history of mild memory disturbance, noted tremor to both hands in early 2021.  Currently taking propanolol 40 mg as needed for tremor, Aricept  10 mg daily, Namenda 5 mg daily.  For the last 1 month, has noted fatigue, heart rate in the 40s.  Around that time, Namenda 5 mg was added, propanolol was switched to as needed.  1 week ago, amlodipine  5 mg daily was started.  His wife usually gives him propanolol 3-4 times a week when she notes tremor.  Tremors in both hands, greater in the right, most notable with eating.  Memory is overall stable.  Does little driving, mostly because he and his wife are always together, he navigates.  More still has benign forgetfulness.  Has been more sleepy, sleeping 14 hours a day.  Has noted a few headaches.  Is on oxycodone  for chronic back pain.  Presents today for evaluation accompanied by his wife.  MMSE 25/30 today.  HISTORY  10/12/2019 Dr. Tilda Fogo: Fred [name] is a 74 year old right-handed white male with a history of a mild memory disturbance.  The patient was last seen through this office about 2  years ago, he has been maintained on Aricept  to 10 mg at night.  He has tolerated the medication well.  Over the last 2 years he has had some slight increased problems with forgetfulness.  The is still able to operate a motor vehicle, he denies any difficulty with safety issues or with directions.  He indicates that his wife helps him out with keeping up with medications and appointments, she does the finances and always has.  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  He has a very strong family history of Alzheimer's disease with dementia affecting his mother and a brother and a sister.  Over the last 6 months he has noted some intermittent tremor that affects both hands equally.  The tremor is present with activity of the hands, not present at rest.  He denies any tremor affecting the jaw or the head or neck or any vocal tremor.  He is not having a lot of troubles feeding himself or performing handwriting.  The patient has had some issues with sleeping quite a bit during the day, he will go to bed and sleep 12 hours, sometimes up to 15 hours a day.  The patient has a good energy level once he is awake during the day and he remains active.  He reports some bilateral foot numbness following a frostbite injury many years ago.  He does note some slight gait instability, he has not had any falls.  He reports no other focal numbness or weakness of the extremities.  He is sent to this office for further evaluation.   REVIEW OF SYSTEMS: Out of a complete 14 system review of symptoms, the patient complains only of the following symptoms, and all other reviewed systems are negative.  See HPI  ALLERGIES: Allergies  Allergen Reactions   Acetaminophen     Gabapentin Hives and Nausea And Vomiting    08/01/13  PT DENIES  Other Reaction(s): GI Intolerance   Nsaids     Other reaction(s): chronic kidney disease  Other Reaction(s): chronic kidney  disease   Oxycodone  Hcl    Oxycodone -Acetaminophen      Other Reaction(s): itching/shakes   Oxycodone -Aspirin Hives and Nausea And Vomiting    Per wife  this was 40 yrs ago  Approx.  1970s  Other Reaction(s): GI Intolerance   Pregabalin Other (See Comments)    Falls asleep while talking to people  Other reaction(s): significant drowsiness  Other Reaction(s): Fatique  Other Reaction(s): Fatigue, significant drowsiness   Sulfa Antibiotics     Other reaction(s): Unknown    HOME MEDICATIONS: Outpatient Medications Prior to Visit  Medication Sig Dispense Refill   amLODipine  (NORVASC ) 5 MG tablet Take 5 mg by mouth daily.     atorvastatin  (LIPITOR) 80 MG tablet Take 80 mg by mouth daily.     donepezil  (ARICEPT ) 10 MG tablet Take 10 mg by mouth  daily.     DULoxetine  (CYMBALTA ) 60 MG capsule Take 60 mg by mouth 2 (two) times daily.      lisinopril  (ZESTRIL ) 20 MG tablet Take 20 mg by mouth 2 (two) times daily.     memantine (NAMENDA) 10 MG tablet Take 10 mg by mouth 2 (two) times daily.     risperiDONE (RISPERDAL) 1 MG tablet 1 tablet Orally Once a day for 30 days     oxyCODONE  (ROXICODONE ) 5 MG immediate release tablet Take 1 tablet (5 mg total) by mouth every 8 (eight) hours as needed for severe pain. 90 tablet 0   No facility-administered medications prior to visit.    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 ,OCCIPITAL SKULL FRACTURE-- RESIDUAL MILD MEMORY ISSUES AND RIGHT SIDE HEARING LOSS (PT FELL ON HEAD FROM 6 FT LADDER)   Hyperlipidemia    Hypertension    Mild memory disturbance    DUE TO TBI , 10-2008   (per pt mild)   Nocturia    twice   Prostate cancer  (HCC)    UROLOGIST-- DR Rozanne Corners---  Stage T2a, Gleason 4+3, PSA 5.4,  s/p  radical prostatectomy 2015;   05-14-2018  per pt PSA undetectable   Pulmonary embolism, bilateral (HCC) 12/02/2017   currently taking xarelto    Wears dentures    upper   Wears glasses    Wears hearing aid in both ears     PAST SURGICAL HISTORY: Past Surgical History:  Procedure Laterality Date   ANTERIOR CERVICAL DECOMP/DISCECTOMY FUSION  09-19-2010    dr Rochelle Chu  @[facility]    COLONOSCOPY W/ POLYPECTOMY  last one 03/ 2019   CYSTOSCOPY N/A 05/17/2018   Procedure: CYSTOSCOPY/ REMOVAL OF BLADDER CALCULUS;  Surgeon: Florencio Hunting, MD;  Location: [facility];  Service: Urology;  Laterality: N/A;  ONLY NEEDS 45 MIN   FINGER SURGERY  1970s   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 RIGHT MIDDLE FINGER   IMPLANTATION BONE ANCHORED HEARING AID Right 2011    @[facility]    no longer have this   LYMPHADENECTOMY Bilateral 08/04/2013   Procedure: LYMPHADENECTOMY;  Surgeon: Kristeen Peto, MD;  Location: [facility];  Service: Urology;  Laterality: Bilateral;   PROSTATE SURGERY     ROBOT ASSISTED LAPAROSCOPIC RADICAL PROSTATECTOMY N/A 08/04/2013   Procedure: ROBOTIC ASSISTED LAPAROSCOPIC RADICAL PROSTATECTOMY LEVEL 2;  Surgeon: Kristeen Peto, MD;  Location: [facility];  Service: Urology;  Laterality: N/A;    FAMILY HISTORY: Family History  Problem Relation Age of Onset   Stroke Mother    Dementia Mother    Heart disease Father    Stroke Father        heat stroke   Kidney disease Brother    Dementia Brother     SOCIAL HISTORY: Social History   Socioeconomic History   Marital status: Married    Spouse name: Not on file   Number of children: 6   Years of education: 12+   Highest education level: Not on file  Occupational History   Occupation: Retired  Tobacco Use   Smoking status: Former    Current packs/day:  0.00    Average packs/day: 1 pack/day for 30.0 years (30.0 ttl pk-yrs)    Types: Cigarettes    Start date:  05/26/1971    Quit date: 05/25/2001    Years since quitting: 22.4   Smokeless tobacco: Former    Types: Chew, Snuff    Quit date: 09/22/2001  Vaping Use   Vaping status: Never Used  Substance and Sexual Activity   Alcohol  use: Not Currently   Drug use: No   Sexual activity: Not Currently  Other Topics Concern   Not on file  Social History Narrative   Lives with wife   Caffeine use: Coffee daily (1-2 per day)   Some soda   Right handed    Social Drivers of Corporate investment banker Strain: Not on file  Food Insecurity: Not on file  Transportation Needs: Not on file  Physical Activity: Not on file  Stress: Not on file  Social Connections: Not on file  Intimate Partner Violence: Not on file   PHYSICAL EXAM  Vitals:   10/13/23 1052  BP: (!) 96/56  Pulse: 65  Resp: 15  Height: 5' 8 (1.727 m)   Body mass index is 27.13 kg/m.  Generalized: Well developed, in no acute distress     10/13/2023   10:56 AM 10/06/2022   10:02 AM 09/24/2021    2:19 PM  MMSE - Mini Mental State Exam  Orientation to time 1 1 3   Orientation to Place 5 3 5   Registration 3 2 3   Attention/ Calculation 1 3 3   Recall 0 1 2  Language- name 2 objects 2 2 2   Language- repeat 1 1 1   Language- follow 3 step command 3 3 3   Language- read & follow direction 1 1 1   Write a sentence 1 1 0  Copy design 0 1 0  Total score 18 19 23    Neurological examination  Mentation: Alert oriented to time, place, history is provided by mostly his wife. Follows all commands speech and language fluent, is quiet Cranial nerve II-XII: Pupils were equal round reactive to light. Extraocular movements were full, visual field were full on confrontational test. Facial sensation and strength were normal. Head turning and shoulder shrug were normal and symmetric.  Motor: The motor testing reveals 5 over 5 strength of all 4 extremities. Good symmetric motor tone is noted throughout.  Sensory: Sensory testing is intact to soft touch  on all 4 extremities. No evidence of extinction is noted.  Coordination: Cerebellar testing reveals good finger-nose-finger and heel-to-shin bilaterally.  No tremor was noted.   Gait and station: Gait is slightly wide-based, but steady and independent Reflexes: Deep tendon reflexes are symmetric and normal bilaterally.   DIAGNOSTIC DATA (LABS, IMAGING, TESTING) - I reviewed patient records, labs, notes, testing and imaging myself where available.  Lab Results  Component Value Date   WBC 9.9 05/14/2018   HGB 13.9 05/14/2018   HCT 42.9 05/14/2018   MCV 87.7 05/14/2018   PLT 201 05/14/2018      Component Value Date/Time   NA 139 05/14/2018 1419   K 3.7 05/14/2018 1419   CL 102 05/14/2018 1419   CO2 27 05/14/2018 1419   GLUCOSE 85 05/14/2018 1419   BUN 17 05/14/2018 1419   CREATININE 1.10 05/14/2018 1419   CALCIUM  9.1 05/14/2018 1419   PROT 6.4 (L) 12/03/2017 0819   ALBUMIN 3.6 12/03/2017 0819   AST 19 12/03/2017 0819   ALT 20 12/03/2017 0819   ALKPHOS  64 12/03/2017 0819   BILITOT 0.8 12/03/2017 0819   GFRNONAA >60 05/14/2018 1419   GFRAA >60 05/14/2018 1419   No results found for: CHOL, HDL, LDLCALC, LDLDIRECT, TRIG, CHOLHDL No results found for: [id number] Lab Results  Component Value Date   VITAMINB12 268 07/22/2017   No results found for: TSH  ASSESSMENT AND PLAN 73 y.o. year old male  has a past medical history of Anticoagulated, Benign essential tremor (10/12/2019), Depression, Gait abnormality, Hearing loss, History of kidney stones, History of traumatic brain injury (10/2008), Hyperlipidemia, Hypertension, Mild memory disturbance, Nocturia, Prostate cancer (HCC), Pulmonary embolism, bilateral (HCC) (12/02/2017), Wears dentures, Wears glasses, and Wears hearing aid in both ears. here with:  1.  Dementia, MMSE 18/30 2.  Strong family history of Alzheimer's disease 3.  Tremor, likely essential tremor 4.  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  - Slight decline in MMSE 18/30 (19/30) - Wife requested amyloid testing, will order ATN profile, APOE risk - Will continue Aricept  and Namenda, prescription coming from PCP - Also on Risperdal from PCP for sundowning, which is better - Continue to advise against driving - Recommend continue exercise, healthy eating, brain stimulating activity - Follow-up in 1 year or sooner if needed  Addendum 10/28/23 SS: A+T+N-. E4/E4 2 copies of APOE variant  Jeanmarie Millet, AGNP-C, DNP 10/13/2023, 11:13 AM [facility] [address] [place], [place] [address] [phone]

## 2023-10-13 ENCOUNTER — Encounter: Payer: Self-pay | Admitting: Neurology

## 2023-10-13 ENCOUNTER — Ambulatory Visit: Payer: Medicare Other | Admitting: Neurology

## 2023-10-13 VITALS — BP 96/56 | HR 65 | Resp 15 | Ht 68.0 in

## 2023-10-13 DIAGNOSIS — G250 Essential tremor: Secondary | ICD-10-CM

## 2023-10-13 DIAGNOSIS — F03B4 Unspecified dementia, moderate, with anxiety: Secondary | ICD-10-CM

## 2023-10-13 NOTE — Patient Instructions (Signed)
 Check lab testing screening for Alzheimer's Disease Continue current medications from primary care Recommend exercise, brain stimulating activity,

## 2023-10-28 ENCOUNTER — Telehealth: Payer: Self-pay | Admitting: Neurology

## 2023-10-28 LAB — ATN PROFILE
A -- Beta-amyloid 42/40 Ratio: 0.089 — ABNORMAL LOW (ref >0.102)
Beta-amyloid 40: 206.93 pg/mL
Beta-amyloid 42: 18.5 pg/mL
N -- NfL, Plasma: 4.24 pg/mL (ref 0.00–6.04)
T -- p-tau181: 1.72 pg/mL — ABNORMAL HIGH (ref 0.00–0.97)

## 2023-10-28 LAB — APOE ALZHEIMER'S RISK

## 2023-10-28 NOTE — Telephone Encounter (Signed)
 I called the patient's wife. Labs show A+T+N-.  Labs are consistent with the presence of Alzheimer's related pathology. E4/E4 2 copies of APOE variant.  Advised blood work is consistent with Alzheimer's.  Will continue Aricept  and Namenda.  Discussed the importance of management of vascular risk factors, exercise to promote brain health.  Call for any issues or concerns.  Likely with memory score not a candidate for amyloid therapy, she is not interested at this point given risks.

## 2023-11-04 DIAGNOSIS — R251 Tremor, unspecified: Secondary | ICD-10-CM | POA: Diagnosis not present

## 2023-11-04 DIAGNOSIS — I10 Essential (primary) hypertension: Secondary | ICD-10-CM | POA: Diagnosis not present

## 2023-11-04 DIAGNOSIS — R5383 Other fatigue: Secondary | ICD-10-CM | POA: Diagnosis not present

## 2023-11-04 DIAGNOSIS — Z79899 Other long term (current) drug therapy: Secondary | ICD-10-CM | POA: Diagnosis not present

## 2023-11-04 DIAGNOSIS — E538 Deficiency of other specified B group vitamins: Secondary | ICD-10-CM | POA: Diagnosis not present

## 2023-11-04 DIAGNOSIS — Z1159 Encounter for screening for other viral diseases: Secondary | ICD-10-CM | POA: Diagnosis not present

## 2023-11-04 DIAGNOSIS — Z0000 Encounter for general adult medical examination without abnormal findings: Secondary | ICD-10-CM | POA: Diagnosis not present

## 2023-11-04 DIAGNOSIS — E782 Mixed hyperlipidemia: Secondary | ICD-10-CM | POA: Diagnosis not present

## 2023-11-04 DIAGNOSIS — G300 Alzheimer's disease with early onset: Secondary | ICD-10-CM | POA: Diagnosis not present

## 2023-11-10 DIAGNOSIS — Z1211 Encounter for screening for malignant neoplasm of colon: Secondary | ICD-10-CM | POA: Diagnosis not present

## 2023-11-16 DIAGNOSIS — N1831 Chronic kidney disease, stage 3a: Secondary | ICD-10-CM | POA: Diagnosis not present

## 2023-11-16 DIAGNOSIS — G300 Alzheimer's disease with early onset: Secondary | ICD-10-CM | POA: Diagnosis not present

## 2023-11-16 DIAGNOSIS — E782 Mixed hyperlipidemia: Secondary | ICD-10-CM | POA: Diagnosis not present

## 2023-12-17 DIAGNOSIS — E782 Mixed hyperlipidemia: Secondary | ICD-10-CM | POA: Diagnosis not present

## 2023-12-17 DIAGNOSIS — N1831 Chronic kidney disease, stage 3a: Secondary | ICD-10-CM | POA: Diagnosis not present

## 2023-12-17 DIAGNOSIS — G300 Alzheimer's disease with early onset: Secondary | ICD-10-CM | POA: Diagnosis not present

## 2024-01-17 DIAGNOSIS — E782 Mixed hyperlipidemia: Secondary | ICD-10-CM | POA: Diagnosis not present

## 2024-01-17 DIAGNOSIS — N1831 Chronic kidney disease, stage 3a: Secondary | ICD-10-CM | POA: Diagnosis not present

## 2024-01-17 DIAGNOSIS — G300 Alzheimer's disease with early onset: Secondary | ICD-10-CM | POA: Diagnosis not present

## 2024-02-16 DIAGNOSIS — N1831 Chronic kidney disease, stage 3a: Secondary | ICD-10-CM | POA: Diagnosis not present

## 2024-02-16 DIAGNOSIS — G300 Alzheimer's disease with early onset: Secondary | ICD-10-CM | POA: Diagnosis not present

## 2024-02-16 DIAGNOSIS — E782 Mixed hyperlipidemia: Secondary | ICD-10-CM | POA: Diagnosis not present

## 2024-10-12 ENCOUNTER — Ambulatory Visit: Admitting: Neurology

## 2024-10-19 ENCOUNTER — Ambulatory Visit: Admitting: Neurology
